# Patient Record
Sex: Male | Born: 1935 | Race: White | Hispanic: No | Smoking: Current every day smoker
Health system: Southern US, Community
[De-identification: ages and names within clinical notes are randomized; demographics above are authoritative.]

## PROBLEM LIST (undated history)

## (undated) DIAGNOSIS — R51 Headache: Secondary | ICD-10-CM

## (undated) DIAGNOSIS — K219 Gastro-esophageal reflux disease without esophagitis: Secondary | ICD-10-CM

## (undated) DIAGNOSIS — R569 Unspecified convulsions: Secondary | ICD-10-CM

## (undated) DIAGNOSIS — J189 Pneumonia, unspecified organism: Secondary | ICD-10-CM

## (undated) DIAGNOSIS — H9190 Unspecified hearing loss, unspecified ear: Secondary | ICD-10-CM

## (undated) DIAGNOSIS — J449 Chronic obstructive pulmonary disease, unspecified: Secondary | ICD-10-CM

## (undated) DIAGNOSIS — I639 Cerebral infarction, unspecified: Secondary | ICD-10-CM

## (undated) DIAGNOSIS — C3490 Malignant neoplasm of unspecified part of unspecified bronchus or lung: Secondary | ICD-10-CM

## (undated) DIAGNOSIS — G40209 Localization-related (focal) (partial) symptomatic epilepsy and epileptic syndromes with complex partial seizures, not intractable, without status epilepticus: Secondary | ICD-10-CM

## (undated) DIAGNOSIS — F0390 Unspecified dementia without behavioral disturbance: Secondary | ICD-10-CM

## (undated) DIAGNOSIS — I10 Essential (primary) hypertension: Secondary | ICD-10-CM

## (undated) HISTORY — PX: INNER EAR SURGERY: SHX679

## (undated) HISTORY — PX: TONSILLECTOMY: SUR1361

## (undated) HISTORY — DX: Chronic obstructive pulmonary disease, unspecified: J44.9

## (undated) HISTORY — DX: Essential (primary) hypertension: I10

## (undated) HISTORY — DX: Localization-related (focal) (partial) symptomatic epilepsy and epileptic syndromes with complex partial seizures, not intractable, without status epilepticus: G40.209

## (undated) HISTORY — DX: Malignant neoplasm of unspecified part of unspecified bronchus or lung: C34.90

## (undated) HISTORY — DX: Gastro-esophageal reflux disease without esophagitis: K21.9

## (undated) HISTORY — DX: Headache: R51

## (undated) HISTORY — PX: TOE AMPUTATION: SHX809

---

## 1997-06-16 ENCOUNTER — Ambulatory Visit (HOSPITAL_COMMUNITY): Admission: RE | Admit: 1997-06-16 | Discharge: 1997-06-16 | Payer: Self-pay | Admitting: Family Medicine

## 1997-07-27 ENCOUNTER — Encounter: Admission: RE | Admit: 1997-07-27 | Discharge: 1997-07-27 | Payer: Self-pay | Admitting: Internal Medicine

## 1997-08-02 ENCOUNTER — Ambulatory Visit (HOSPITAL_COMMUNITY): Admission: RE | Admit: 1997-08-02 | Discharge: 1997-08-02 | Payer: Self-pay | Admitting: Hematology and Oncology

## 1997-08-02 ENCOUNTER — Encounter: Admission: RE | Admit: 1997-08-02 | Discharge: 1997-08-02 | Payer: Self-pay | Admitting: Hematology and Oncology

## 1997-08-08 ENCOUNTER — Inpatient Hospital Stay: Admission: AD | Admit: 1997-08-08 | Discharge: 1997-08-10 | Payer: Self-pay | Admitting: Internal Medicine

## 1997-08-19 ENCOUNTER — Encounter: Admission: RE | Admit: 1997-08-19 | Discharge: 1997-08-19 | Payer: Self-pay | Admitting: Internal Medicine

## 1997-09-02 ENCOUNTER — Encounter: Admission: RE | Admit: 1997-09-02 | Discharge: 1997-09-02 | Payer: Self-pay | Admitting: Internal Medicine

## 1997-09-16 ENCOUNTER — Encounter: Admission: RE | Admit: 1997-09-16 | Discharge: 1997-09-16 | Payer: Self-pay | Admitting: Internal Medicine

## 1997-10-26 ENCOUNTER — Ambulatory Visit (HOSPITAL_COMMUNITY): Admission: RE | Admit: 1997-10-26 | Discharge: 1997-10-26 | Payer: Self-pay | Admitting: Internal Medicine

## 1997-10-26 ENCOUNTER — Encounter: Admission: RE | Admit: 1997-10-26 | Discharge: 1997-10-26 | Payer: Self-pay | Admitting: Internal Medicine

## 1997-11-15 ENCOUNTER — Ambulatory Visit (HOSPITAL_COMMUNITY): Admission: RE | Admit: 1997-11-15 | Discharge: 1997-11-15 | Payer: Self-pay | Admitting: Hematology and Oncology

## 1997-11-30 ENCOUNTER — Ambulatory Visit (HOSPITAL_COMMUNITY): Admission: RE | Admit: 1997-11-30 | Discharge: 1997-11-30 | Payer: Self-pay | Admitting: Hematology and Oncology

## 1998-05-10 ENCOUNTER — Ambulatory Visit (HOSPITAL_COMMUNITY): Admission: RE | Admit: 1998-05-10 | Discharge: 1998-05-10 | Payer: Self-pay | Admitting: Internal Medicine

## 1998-05-10 ENCOUNTER — Encounter: Admission: RE | Admit: 1998-05-10 | Discharge: 1998-05-10 | Payer: Self-pay | Admitting: Internal Medicine

## 1998-05-17 ENCOUNTER — Ambulatory Visit (HOSPITAL_COMMUNITY): Admission: RE | Admit: 1998-05-17 | Discharge: 1998-05-17 | Payer: Self-pay | Admitting: *Deleted

## 1999-01-31 ENCOUNTER — Encounter: Admission: RE | Admit: 1999-01-31 | Discharge: 1999-01-31 | Payer: Self-pay | Admitting: Internal Medicine

## 1999-02-27 ENCOUNTER — Encounter: Admission: RE | Admit: 1999-02-27 | Discharge: 1999-02-27 | Payer: Self-pay | Admitting: Hematology and Oncology

## 1999-03-19 ENCOUNTER — Encounter: Admission: RE | Admit: 1999-03-19 | Discharge: 1999-03-19 | Payer: Self-pay | Admitting: Internal Medicine

## 1999-04-16 ENCOUNTER — Encounter: Admission: RE | Admit: 1999-04-16 | Discharge: 1999-04-16 | Payer: Self-pay | Admitting: Internal Medicine

## 1999-12-07 ENCOUNTER — Encounter: Admission: RE | Admit: 1999-12-07 | Discharge: 1999-12-07 | Payer: Self-pay | Admitting: Internal Medicine

## 2001-03-19 ENCOUNTER — Encounter: Admission: RE | Admit: 2001-03-19 | Discharge: 2001-03-19 | Payer: Self-pay | Admitting: Internal Medicine

## 2001-03-27 ENCOUNTER — Ambulatory Visit (HOSPITAL_COMMUNITY): Admission: RE | Admit: 2001-03-27 | Discharge: 2001-03-27 | Payer: Self-pay | Admitting: Internal Medicine

## 2001-10-27 ENCOUNTER — Encounter: Admission: RE | Admit: 2001-10-27 | Discharge: 2001-10-27 | Payer: Self-pay | Admitting: Internal Medicine

## 2001-11-20 ENCOUNTER — Encounter: Admission: RE | Admit: 2001-11-20 | Discharge: 2001-11-20 | Payer: Self-pay | Admitting: Internal Medicine

## 2001-12-04 ENCOUNTER — Encounter: Admission: RE | Admit: 2001-12-04 | Discharge: 2001-12-04 | Payer: Self-pay | Admitting: Internal Medicine

## 2002-04-01 ENCOUNTER — Encounter: Admission: RE | Admit: 2002-04-01 | Discharge: 2002-04-01 | Payer: Self-pay | Admitting: Internal Medicine

## 2002-04-12 ENCOUNTER — Encounter: Admission: RE | Admit: 2002-04-12 | Discharge: 2002-04-12 | Payer: Self-pay | Admitting: Internal Medicine

## 2002-07-01 ENCOUNTER — Encounter: Admission: RE | Admit: 2002-07-01 | Discharge: 2002-07-01 | Payer: Self-pay | Admitting: Internal Medicine

## 2002-08-06 ENCOUNTER — Encounter: Admission: RE | Admit: 2002-08-06 | Discharge: 2002-08-06 | Payer: Self-pay | Admitting: Internal Medicine

## 2002-12-09 ENCOUNTER — Encounter: Admission: RE | Admit: 2002-12-09 | Discharge: 2002-12-09 | Payer: Self-pay | Admitting: Internal Medicine

## 2003-01-24 ENCOUNTER — Encounter: Admission: RE | Admit: 2003-01-24 | Discharge: 2003-01-24 | Payer: Self-pay | Admitting: Internal Medicine

## 2003-03-07 ENCOUNTER — Encounter: Admission: RE | Admit: 2003-03-07 | Discharge: 2003-03-07 | Payer: Self-pay | Admitting: Internal Medicine

## 2003-05-13 ENCOUNTER — Encounter: Admission: RE | Admit: 2003-05-13 | Discharge: 2003-05-13 | Payer: Self-pay | Admitting: Internal Medicine

## 2003-06-28 ENCOUNTER — Encounter: Admission: RE | Admit: 2003-06-28 | Discharge: 2003-06-28 | Payer: Self-pay | Admitting: Internal Medicine

## 2003-11-15 ENCOUNTER — Emergency Department (HOSPITAL_COMMUNITY): Admission: EM | Admit: 2003-11-15 | Discharge: 2003-11-15 | Payer: Self-pay | Admitting: Family Medicine

## 2003-11-16 ENCOUNTER — Ambulatory Visit (HOSPITAL_COMMUNITY): Admission: RE | Admit: 2003-11-16 | Discharge: 2003-11-16 | Payer: Self-pay | Admitting: *Deleted

## 2003-11-21 ENCOUNTER — Ambulatory Visit: Payer: Self-pay | Admitting: Internal Medicine

## 2003-11-22 ENCOUNTER — Ambulatory Visit: Payer: Self-pay | Admitting: Internal Medicine

## 2003-12-02 ENCOUNTER — Ambulatory Visit: Payer: Self-pay | Admitting: Internal Medicine

## 2003-12-06 ENCOUNTER — Encounter (INDEPENDENT_AMBULATORY_CARE_PROVIDER_SITE_OTHER): Payer: Self-pay | Admitting: Specialist

## 2003-12-06 ENCOUNTER — Ambulatory Visit: Admission: RE | Admit: 2003-12-06 | Discharge: 2003-12-06 | Payer: Self-pay | Admitting: Internal Medicine

## 2003-12-06 ENCOUNTER — Encounter (INDEPENDENT_AMBULATORY_CARE_PROVIDER_SITE_OTHER): Payer: Self-pay | Admitting: *Deleted

## 2003-12-13 ENCOUNTER — Ambulatory Visit: Payer: Self-pay | Admitting: Internal Medicine

## 2003-12-19 ENCOUNTER — Ambulatory Visit (HOSPITAL_COMMUNITY): Admission: RE | Admit: 2003-12-19 | Discharge: 2003-12-19 | Payer: Self-pay | Admitting: Internal Medicine

## 2003-12-20 ENCOUNTER — Ambulatory Visit: Admission: RE | Admit: 2003-12-20 | Discharge: 2004-02-23 | Payer: Self-pay | Admitting: Radiation Oncology

## 2003-12-22 ENCOUNTER — Ambulatory Visit: Payer: Self-pay | Admitting: Internal Medicine

## 2003-12-31 ENCOUNTER — Ambulatory Visit (HOSPITAL_COMMUNITY): Admission: RE | Admit: 2003-12-31 | Discharge: 2003-12-31 | Payer: Self-pay | Admitting: Internal Medicine

## 2004-01-02 ENCOUNTER — Ambulatory Visit: Payer: Self-pay | Admitting: Internal Medicine

## 2004-02-06 ENCOUNTER — Ambulatory Visit: Payer: Self-pay | Admitting: Internal Medicine

## 2004-02-07 DIAGNOSIS — C3490 Malignant neoplasm of unspecified part of unspecified bronchus or lung: Secondary | ICD-10-CM | POA: Insufficient documentation

## 2004-03-08 ENCOUNTER — Ambulatory Visit (HOSPITAL_COMMUNITY): Admission: RE | Admit: 2004-03-08 | Discharge: 2004-03-08 | Payer: Self-pay | Admitting: Internal Medicine

## 2004-03-15 ENCOUNTER — Ambulatory Visit: Admission: RE | Admit: 2004-03-15 | Discharge: 2004-03-15 | Payer: Self-pay | Admitting: Radiation Oncology

## 2004-03-27 ENCOUNTER — Ambulatory Visit: Payer: Self-pay | Admitting: Internal Medicine

## 2004-04-04 ENCOUNTER — Ambulatory Visit: Payer: Self-pay | Admitting: Internal Medicine

## 2004-05-01 ENCOUNTER — Ambulatory Visit (HOSPITAL_COMMUNITY): Admission: EM | Admit: 2004-05-01 | Discharge: 2004-05-01 | Payer: Self-pay | Admitting: Emergency Medicine

## 2004-05-01 ENCOUNTER — Ambulatory Visit: Payer: Self-pay | Admitting: Gastroenterology

## 2004-05-30 ENCOUNTER — Ambulatory Visit: Payer: Self-pay | Admitting: Internal Medicine

## 2004-06-04 ENCOUNTER — Ambulatory Visit (HOSPITAL_COMMUNITY): Admission: RE | Admit: 2004-06-04 | Discharge: 2004-06-04 | Payer: Self-pay | Admitting: Internal Medicine

## 2004-07-17 ENCOUNTER — Ambulatory Visit: Payer: Self-pay | Admitting: Internal Medicine

## 2004-08-15 ENCOUNTER — Ambulatory Visit (HOSPITAL_COMMUNITY): Admission: RE | Admit: 2004-08-15 | Discharge: 2004-08-15 | Payer: Self-pay | Admitting: Internal Medicine

## 2004-09-04 ENCOUNTER — Ambulatory Visit: Payer: Self-pay | Admitting: Internal Medicine

## 2004-10-25 ENCOUNTER — Ambulatory Visit: Payer: Self-pay | Admitting: Internal Medicine

## 2004-11-03 ENCOUNTER — Emergency Department (HOSPITAL_COMMUNITY): Admission: EM | Admit: 2004-11-03 | Discharge: 2004-11-03 | Payer: Self-pay | Admitting: Family Medicine

## 2004-11-12 ENCOUNTER — Ambulatory Visit (HOSPITAL_COMMUNITY): Admission: RE | Admit: 2004-11-12 | Discharge: 2004-11-12 | Payer: Self-pay | Admitting: Internal Medicine

## 2004-12-19 ENCOUNTER — Ambulatory Visit: Payer: Self-pay | Admitting: Internal Medicine

## 2005-01-08 ENCOUNTER — Ambulatory Visit: Payer: Self-pay | Admitting: Gastroenterology

## 2005-01-08 ENCOUNTER — Ambulatory Visit: Payer: Self-pay | Admitting: Critical Care Medicine

## 2005-01-25 ENCOUNTER — Ambulatory Visit: Payer: Self-pay | Admitting: Internal Medicine

## 2005-02-06 ENCOUNTER — Ambulatory Visit: Payer: Self-pay | Admitting: Gastroenterology

## 2005-02-13 ENCOUNTER — Ambulatory Visit (HOSPITAL_COMMUNITY): Admission: RE | Admit: 2005-02-13 | Discharge: 2005-02-13 | Payer: Self-pay | Admitting: Gastroenterology

## 2005-02-13 ENCOUNTER — Ambulatory Visit: Payer: Self-pay | Admitting: Gastroenterology

## 2005-02-14 ENCOUNTER — Ambulatory Visit: Payer: Self-pay | Admitting: Internal Medicine

## 2005-02-20 ENCOUNTER — Ambulatory Visit (HOSPITAL_COMMUNITY): Admission: RE | Admit: 2005-02-20 | Discharge: 2005-02-20 | Payer: Self-pay | Admitting: Internal Medicine

## 2005-02-28 ENCOUNTER — Ambulatory Visit: Payer: Self-pay | Admitting: Gastroenterology

## 2005-03-05 ENCOUNTER — Ambulatory Visit: Payer: Self-pay | Admitting: Internal Medicine

## 2005-03-14 ENCOUNTER — Ambulatory Visit: Payer: Self-pay | Admitting: Pulmonary Disease

## 2005-04-03 ENCOUNTER — Ambulatory Visit: Payer: Self-pay | Admitting: Internal Medicine

## 2005-04-25 LAB — CBC WITH DIFFERENTIAL/PLATELET
Basophils Absolute: 0.1 10*3/uL (ref 0.0–0.1)
EOS%: 3.6 % (ref 0.0–7.0)
Eosinophils Absolute: 0.1 10*3/uL (ref 0.0–0.5)
LYMPH%: 21.5 % (ref 14.0–48.0)
MCH: 22.3 pg — ABNORMAL LOW (ref 28.0–33.4)
MCV: 69.9 fL — ABNORMAL LOW (ref 81.6–98.0)
MONO%: 22.5 % — ABNORMAL HIGH (ref 0.0–13.0)
NEUT#: 1.4 10*3/uL — ABNORMAL LOW (ref 1.5–6.5)
Platelets: 270 10*3/uL (ref 145–400)
RBC: 4.27 10*6/uL (ref 4.20–5.71)

## 2005-04-25 LAB — TECHNOLOGIST REVIEW

## 2005-05-02 LAB — CBC WITH DIFFERENTIAL/PLATELET
BASO%: 2.4 % — ABNORMAL HIGH (ref 0.0–2.0)
EOS%: 2.7 % (ref 0.0–7.0)
HCT: 34.5 % — ABNORMAL LOW (ref 38.7–49.9)
LYMPH%: 19.1 % (ref 14.0–48.0)
MCH: 22 pg — ABNORMAL LOW (ref 28.0–33.4)
MCHC: 30.6 g/dL — ABNORMAL LOW (ref 32.0–35.9)
NEUT%: 61.6 % (ref 40.0–75.0)
Platelets: 366 10*3/uL (ref 145–400)

## 2005-05-07 ENCOUNTER — Ambulatory Visit (HOSPITAL_COMMUNITY): Admission: RE | Admit: 2005-05-07 | Discharge: 2005-05-07 | Payer: Self-pay | Admitting: Internal Medicine

## 2005-05-09 ENCOUNTER — Ambulatory Visit: Payer: Self-pay | Admitting: Internal Medicine

## 2005-05-09 LAB — CBC WITH DIFFERENTIAL/PLATELET
BASO%: 0.3 % (ref 0.0–2.0)
EOS%: 0 % (ref 0.0–7.0)
HCT: 35.6 % — ABNORMAL LOW (ref 38.7–49.9)
LYMPH%: 8.3 % — ABNORMAL LOW (ref 14.0–48.0)
MCH: 22.3 pg — ABNORMAL LOW (ref 28.0–33.4)
MCHC: 31.5 g/dL — ABNORMAL LOW (ref 32.0–35.9)
MCV: 70.6 fL — ABNORMAL LOW (ref 81.6–98.0)
MONO#: 0.2 10*3/uL (ref 0.1–0.9)
NEUT%: 88.8 % — ABNORMAL HIGH (ref 40.0–75.0)
Platelets: 439 10*3/uL — ABNORMAL HIGH (ref 145–400)

## 2005-05-09 LAB — COMPREHENSIVE METABOLIC PANEL
ALT: 19 U/L (ref 0–40)
AST: 15 U/L (ref 0–37)
Alkaline Phosphatase: 129 U/L — ABNORMAL HIGH (ref 39–117)
Creatinine, Ser: 0.7 mg/dL (ref 0.4–1.5)
Total Bilirubin: 0.2 mg/dL — ABNORMAL LOW (ref 0.3–1.2)

## 2005-05-16 LAB — COMPREHENSIVE METABOLIC PANEL
ALT: 21 U/L (ref 0–40)
AST: 19 U/L (ref 0–37)
Albumin: 3.9 g/dL (ref 3.5–5.2)
BUN: 9 mg/dL (ref 6–23)
Calcium: 8.7 mg/dL (ref 8.4–10.5)
Chloride: 102 mEq/L (ref 96–112)
Potassium: 4 mEq/L (ref 3.5–5.3)
Sodium: 137 mEq/L (ref 135–145)
Total Protein: 6.3 g/dL (ref 6.0–8.3)

## 2005-05-16 LAB — CBC WITH DIFFERENTIAL/PLATELET
BASO%: 0.6 % (ref 0.0–2.0)
EOS%: 3.6 % (ref 0.0–7.0)
HCT: 32.2 % — ABNORMAL LOW (ref 38.7–49.9)
LYMPH%: 19.7 % (ref 14.0–48.0)
MCH: 21.7 pg — ABNORMAL LOW (ref 28.0–33.4)
MCHC: 31.1 g/dL — ABNORMAL LOW (ref 32.0–35.9)
NEUT%: 55.1 % (ref 40.0–75.0)
lymph#: 0.7 10*3/uL — ABNORMAL LOW (ref 0.9–3.3)

## 2005-05-21 ENCOUNTER — Ambulatory Visit: Payer: Self-pay | Admitting: Internal Medicine

## 2005-05-23 LAB — CBC WITH DIFFERENTIAL/PLATELET
Basophils Absolute: 0.1 10*3/uL (ref 0.0–0.1)
EOS%: 2.6 % (ref 0.0–7.0)
HGB: 10.4 g/dL — ABNORMAL LOW (ref 13.0–17.1)
MCH: 22.5 pg — ABNORMAL LOW (ref 28.0–33.4)
MCV: 69.8 fL — ABNORMAL LOW (ref 81.6–98.0)
MONO%: 14.6 % — ABNORMAL HIGH (ref 0.0–13.0)
NEUT%: 66.5 % (ref 40.0–75.0)
RDW: 20.3 % — ABNORMAL HIGH (ref 11.2–14.6)

## 2005-05-23 LAB — COMPREHENSIVE METABOLIC PANEL
AST: 16 U/L (ref 0–37)
Alkaline Phosphatase: 118 U/L — ABNORMAL HIGH (ref 39–117)
BUN: 15 mg/dL (ref 6–23)
Creatinine, Ser: 0.7 mg/dL (ref 0.4–1.5)
Potassium: 4 mEq/L (ref 3.5–5.3)

## 2005-05-30 LAB — CBC WITH DIFFERENTIAL/PLATELET
Basophils Absolute: 0 10*3/uL (ref 0.0–0.1)
EOS%: 0.1 % (ref 0.0–7.0)
HCT: 34.1 % — ABNORMAL LOW (ref 38.7–49.9)
HGB: 10.7 g/dL — ABNORMAL LOW (ref 13.0–17.1)
MCH: 21.7 pg — ABNORMAL LOW (ref 28.0–33.4)
MCV: 68.9 fL — ABNORMAL LOW (ref 81.6–98.0)
MONO%: 2.4 % (ref 0.0–13.0)
NEUT%: 90.5 % — ABNORMAL HIGH (ref 40.0–75.0)

## 2005-05-30 LAB — COMPREHENSIVE METABOLIC PANEL
AST: 16 U/L (ref 0–37)
Alkaline Phosphatase: 117 U/L (ref 39–117)
BUN: 11 mg/dL (ref 6–23)
Calcium: 9.5 mg/dL (ref 8.4–10.5)
Chloride: 102 mEq/L (ref 96–112)
Creatinine, Ser: 0.7 mg/dL (ref 0.4–1.5)

## 2005-06-04 ENCOUNTER — Ambulatory Visit (HOSPITAL_COMMUNITY): Admission: RE | Admit: 2005-06-04 | Discharge: 2005-06-04 | Payer: Self-pay | Admitting: Internal Medicine

## 2005-06-06 LAB — COMPREHENSIVE METABOLIC PANEL
AST: 17 U/L (ref 0–37)
Albumin: 4.2 g/dL (ref 3.5–5.2)
Alkaline Phosphatase: 116 U/L (ref 39–117)
BUN: 11 mg/dL (ref 6–23)
Glucose, Bld: 126 mg/dL — ABNORMAL HIGH (ref 70–99)
Potassium: 4.5 mEq/L (ref 3.5–5.3)
Sodium: 136 mEq/L (ref 135–145)
Total Bilirubin: 0.4 mg/dL (ref 0.3–1.2)
Total Protein: 7 g/dL (ref 6.0–8.3)

## 2005-06-06 LAB — CBC WITH DIFFERENTIAL/PLATELET
EOS%: 3.6 % (ref 0.0–7.0)
LYMPH%: 22 % (ref 14.0–48.0)
MCH: 22 pg — ABNORMAL LOW (ref 28.0–33.4)
MCV: 69.4 fL — ABNORMAL LOW (ref 81.6–98.0)
MONO%: 23.5 % — ABNORMAL HIGH (ref 0.0–13.0)
Platelets: 313 10*3/uL (ref 145–400)
RBC: 4.72 10*6/uL (ref 4.20–5.71)
RDW: 21.7 % — ABNORMAL HIGH (ref 11.2–14.6)

## 2005-06-13 LAB — CBC WITH DIFFERENTIAL/PLATELET
Basophils Absolute: 0.1 10*3/uL (ref 0.0–0.1)
EOS%: 3.3 % (ref 0.0–7.0)
Eosinophils Absolute: 0.2 10*3/uL (ref 0.0–0.5)
HGB: 10.7 g/dL — ABNORMAL LOW (ref 13.0–17.1)
LYMPH%: 19.4 % (ref 14.0–48.0)
MCH: 21.5 pg — ABNORMAL LOW (ref 28.0–33.4)
MCV: 70.2 fL — ABNORMAL LOW (ref 81.6–98.0)
MONO%: 18.7 % — ABNORMAL HIGH (ref 0.0–13.0)
NEUT#: 2.9 10*3/uL (ref 1.5–6.5)
Platelets: 319 10*3/uL (ref 145–400)
RDW: 21.9 % — ABNORMAL HIGH (ref 11.2–14.6)

## 2005-06-20 LAB — COMPREHENSIVE METABOLIC PANEL
AST: 16 U/L (ref 0–37)
Albumin: 4.3 g/dL (ref 3.5–5.2)
Alkaline Phosphatase: 119 U/L — ABNORMAL HIGH (ref 39–117)
BUN: 12 mg/dL (ref 6–23)
Creatinine, Ser: 0.7 mg/dL (ref 0.4–1.5)
Glucose, Bld: 90 mg/dL (ref 70–99)
Total Bilirubin: 0.3 mg/dL (ref 0.3–1.2)

## 2005-06-20 LAB — CBC WITH DIFFERENTIAL/PLATELET
Basophils Absolute: 0.1 10*3/uL (ref 0.0–0.1)
Eosinophils Absolute: 0 10*3/uL (ref 0.0–0.5)
HCT: 35 % — ABNORMAL LOW (ref 38.7–49.9)
HGB: 10.4 g/dL — ABNORMAL LOW (ref 13.0–17.1)
MCH: 21.3 pg — ABNORMAL LOW (ref 28.0–33.4)
MCV: 71.7 fL — ABNORMAL LOW (ref 81.6–98.0)
MONO%: 11.6 % (ref 0.0–13.0)
NEUT#: 4.4 10*3/uL (ref 1.5–6.5)
NEUT%: 72.9 % (ref 40.0–75.0)
Platelets: 226 10*3/uL (ref 145–400)
RDW: 21.4 % — ABNORMAL HIGH (ref 11.2–14.6)

## 2005-06-27 LAB — CBC WITH DIFFERENTIAL/PLATELET
BASO%: 1.3 % (ref 0.0–2.0)
Eosinophils Absolute: 0.1 10*3/uL (ref 0.0–0.5)
MCV: 70.2 fL — ABNORMAL LOW (ref 81.6–98.0)
MONO%: 14.2 % — ABNORMAL HIGH (ref 0.0–13.0)
RBC: 4.74 10*6/uL (ref 4.20–5.71)
RDW: 21.3 % — ABNORMAL HIGH (ref 11.2–14.6)

## 2005-07-04 ENCOUNTER — Ambulatory Visit: Payer: Self-pay | Admitting: Internal Medicine

## 2005-07-04 LAB — CBC WITH DIFFERENTIAL/PLATELET
Eosinophils Absolute: 0.2 10*3/uL (ref 0.0–0.5)
MONO#: 0.7 10*3/uL (ref 0.1–0.9)
NEUT#: 3.8 10*3/uL (ref 1.5–6.5)
Platelets: 215 10*3/uL (ref 145–400)
RBC: 5.13 10*6/uL (ref 4.20–5.71)
RDW: 21.9 % — ABNORMAL HIGH (ref 11.2–14.6)
WBC: 5.5 10*3/uL (ref 4.0–10.0)
lymph#: 0.8 10*3/uL — ABNORMAL LOW (ref 0.9–3.3)

## 2005-07-10 ENCOUNTER — Ambulatory Visit (HOSPITAL_COMMUNITY): Admission: RE | Admit: 2005-07-10 | Discharge: 2005-07-10 | Payer: Self-pay | Admitting: Internal Medicine

## 2005-07-10 LAB — CBC WITH DIFFERENTIAL/PLATELET
Eosinophils Absolute: 0.2 10*3/uL (ref 0.0–0.5)
HCT: 33.9 % — ABNORMAL LOW (ref 38.7–49.9)
HGB: 10.4 g/dL — ABNORMAL LOW (ref 13.0–17.1)
LYMPH%: 15.2 % (ref 14.0–48.0)
MONO#: 0.6 10*3/uL (ref 0.1–0.9)
NEUT#: 3.6 10*3/uL (ref 1.5–6.5)
NEUT%: 67.7 % (ref 40.0–75.0)
Platelets: 347 10*3/uL (ref 145–400)
WBC: 5.3 10*3/uL (ref 4.0–10.0)

## 2005-07-15 LAB — COMPREHENSIVE METABOLIC PANEL
ALT: 20 U/L (ref 0–40)
AST: 16 U/L (ref 0–37)
Albumin: 4.1 g/dL (ref 3.5–5.2)
Alkaline Phosphatase: 133 U/L — ABNORMAL HIGH (ref 39–117)
BUN: 12 mg/dL (ref 6–23)
Potassium: 4.4 mEq/L (ref 3.5–5.3)
Sodium: 139 mEq/L (ref 135–145)

## 2005-07-15 LAB — CBC WITH DIFFERENTIAL/PLATELET
BASO%: 1.5 % (ref 0.0–2.0)
Basophils Absolute: 0.1 10*3/uL (ref 0.0–0.1)
EOS%: 5.8 % (ref 0.0–7.0)
HCT: 36.3 % — ABNORMAL LOW (ref 38.7–49.9)
HGB: 10.8 g/dL — ABNORMAL LOW (ref 13.0–17.1)
LYMPH%: 22.7 % (ref 14.0–48.0)
MCH: 21.5 pg — ABNORMAL LOW (ref 28.0–33.4)
MCHC: 29.6 g/dL — ABNORMAL LOW (ref 32.0–35.9)
MONO#: 0.6 10*3/uL (ref 0.1–0.9)
NEUT%: 56.4 % (ref 40.0–75.0)
Platelets: 295 10*3/uL (ref 145–400)

## 2005-08-01 ENCOUNTER — Ambulatory Visit (HOSPITAL_COMMUNITY): Admission: RE | Admit: 2005-08-01 | Discharge: 2005-08-01 | Payer: Self-pay | Admitting: Internal Medicine

## 2005-08-01 ENCOUNTER — Emergency Department (HOSPITAL_COMMUNITY): Admission: EM | Admit: 2005-08-01 | Discharge: 2005-08-01 | Payer: Self-pay | Admitting: Emergency Medicine

## 2005-08-01 ENCOUNTER — Ambulatory Visit: Payer: Self-pay | Admitting: Internal Medicine

## 2005-08-08 ENCOUNTER — Ambulatory Visit (HOSPITAL_COMMUNITY): Admission: RE | Admit: 2005-08-08 | Discharge: 2005-08-08 | Payer: Self-pay | Admitting: Internal Medicine

## 2005-08-20 ENCOUNTER — Ambulatory Visit: Payer: Self-pay | Admitting: Internal Medicine

## 2005-08-21 LAB — CBC WITH DIFFERENTIAL/PLATELET
BASO%: 0.4 % (ref 0.0–2.0)
EOS%: 0.1 % (ref 0.0–7.0)
MCH: 22.6 pg — ABNORMAL LOW (ref 28.0–33.4)
MCHC: 31.7 g/dL — ABNORMAL LOW (ref 32.0–35.9)
MONO#: 0.2 10*3/uL (ref 0.1–0.9)
NEUT%: 90.1 % — ABNORMAL HIGH (ref 40.0–75.0)
RBC: 5.09 10*6/uL (ref 4.20–5.71)
RDW: 18.7 % — ABNORMAL HIGH (ref 11.2–14.6)
WBC: 8.5 10*3/uL (ref 4.0–10.0)
lymph#: 0.6 10*3/uL — ABNORMAL LOW (ref 0.9–3.3)

## 2005-08-21 LAB — COMPREHENSIVE METABOLIC PANEL
ALT: 11 U/L (ref 0–40)
AST: 13 U/L (ref 0–37)
Albumin: 4.2 g/dL (ref 3.5–5.2)
CO2: 23 mEq/L (ref 19–32)
Calcium: 9.4 mg/dL (ref 8.4–10.5)
Chloride: 100 mEq/L (ref 96–112)
Creatinine, Ser: 0.79 mg/dL (ref 0.40–1.50)
Potassium: 4.6 mEq/L (ref 3.5–5.3)
Total Protein: 7.2 g/dL (ref 6.0–8.3)

## 2005-08-28 LAB — CBC WITH DIFFERENTIAL/PLATELET
BASO%: 1.1 % (ref 0.0–2.0)
EOS%: 3.4 % (ref 0.0–7.0)
HGB: 10.8 g/dL — ABNORMAL LOW (ref 13.0–17.1)
MCH: 22 pg — ABNORMAL LOW (ref 28.0–33.4)
MCV: 70.5 fL — ABNORMAL LOW (ref 81.6–98.0)
MONO%: 18.7 % — ABNORMAL HIGH (ref 0.0–13.0)
RBC: 4.9 10*6/uL (ref 4.20–5.71)
RDW: 17.2 % — ABNORMAL HIGH (ref 11.2–14.6)
lymph#: 0.9 10*3/uL (ref 0.9–3.3)

## 2005-08-28 LAB — COMPREHENSIVE METABOLIC PANEL
ALT: 18 U/L (ref 0–40)
AST: 13 U/L (ref 0–37)
Albumin: 3.8 g/dL (ref 3.5–5.2)
Alkaline Phosphatase: 120 U/L — ABNORMAL HIGH (ref 39–117)
BUN: 10 mg/dL (ref 6–23)
Calcium: 9.2 mg/dL (ref 8.4–10.5)
Chloride: 100 mEq/L (ref 96–112)
Potassium: 4.7 mEq/L (ref 3.5–5.3)
Sodium: 136 mEq/L (ref 135–145)
Total Protein: 6.7 g/dL (ref 6.0–8.3)

## 2005-08-29 ENCOUNTER — Ambulatory Visit: Admission: RE | Admit: 2005-08-29 | Discharge: 2005-08-29 | Payer: Self-pay | Admitting: Internal Medicine

## 2005-08-29 ENCOUNTER — Encounter: Payer: Self-pay | Admitting: Vascular Surgery

## 2005-09-02 LAB — PROTIME-INR

## 2005-09-04 ENCOUNTER — Other Ambulatory Visit: Payer: Self-pay | Admitting: Internal Medicine

## 2005-09-04 LAB — CBC WITH DIFFERENTIAL/PLATELET
Basophils Absolute: 0 10*3/uL (ref 0.0–0.1)
EOS%: 2.5 % (ref 0.0–7.0)
HCT: 33.8 % — ABNORMAL LOW (ref 38.7–49.9)
HGB: 10.9 g/dL — ABNORMAL LOW (ref 13.0–17.1)
LYMPH%: 19 % (ref 14.0–48.0)
MCH: 23.1 pg — ABNORMAL LOW (ref 28.0–33.4)
NEUT%: 65.8 % (ref 40.0–75.0)
Platelets: 370 10*3/uL (ref 145–400)
lymph#: 1 10*3/uL (ref 0.9–3.3)

## 2005-09-04 LAB — COMPREHENSIVE METABOLIC PANEL
AST: 12 U/L (ref 0–37)
BUN: 11 mg/dL (ref 6–23)
CO2: 26 mEq/L (ref 19–32)
Calcium: 9.1 mg/dL (ref 8.4–10.5)
Chloride: 101 mEq/L (ref 96–112)
Creatinine, Ser: 0.74 mg/dL (ref 0.40–1.50)
Total Bilirubin: 0.2 mg/dL — ABNORMAL LOW (ref 0.3–1.2)

## 2005-09-04 LAB — PROTIME-INR: INR: 1.9 — ABNORMAL LOW (ref 2.00–3.50)

## 2005-09-11 LAB — COMPREHENSIVE METABOLIC PANEL
ALT: 12 U/L (ref 0–40)
AST: 13 U/L (ref 0–37)
Albumin: 3.9 g/dL (ref 3.5–5.2)
CO2: 26 mEq/L (ref 19–32)
Calcium: 9.1 mg/dL (ref 8.4–10.5)
Chloride: 102 mEq/L (ref 96–112)
Potassium: 4.4 mEq/L (ref 3.5–5.3)

## 2005-09-11 LAB — CBC WITH DIFFERENTIAL/PLATELET
BASO%: 1.7 % (ref 0.0–2.0)
Basophils Absolute: 0.1 10*3/uL (ref 0.0–0.1)
EOS%: 4 % (ref 0.0–7.0)
HCT: 34.8 % — ABNORMAL LOW (ref 38.7–49.9)
HGB: 11.1 g/dL — ABNORMAL LOW (ref 13.0–17.1)
MCH: 23.1 pg — ABNORMAL LOW (ref 28.0–33.4)
MONO#: 0.6 10*3/uL (ref 0.1–0.9)
RDW: 18.1 % — ABNORMAL HIGH (ref 11.2–14.6)
WBC: 4.2 10*3/uL (ref 4.0–10.0)
lymph#: 1 10*3/uL (ref 0.9–3.3)

## 2005-09-11 LAB — PROTIME-INR: Protime: 40.8 Seconds — ABNORMAL HIGH (ref 10.6–13.4)

## 2005-09-19 ENCOUNTER — Ambulatory Visit: Payer: Self-pay | Admitting: Internal Medicine

## 2005-09-19 LAB — CBC WITH DIFFERENTIAL/PLATELET
BASO%: 0.3 % (ref 0.0–2.0)
EOS%: 0 % (ref 0.0–7.0)
HCT: 35.4 % — ABNORMAL LOW (ref 38.7–49.9)
LYMPH%: 11.9 % — ABNORMAL LOW (ref 14.0–48.0)
MCH: 23.1 pg — ABNORMAL LOW (ref 28.0–33.4)
MCHC: 31.6 g/dL — ABNORMAL LOW (ref 32.0–35.9)
MCV: 73.1 fL — ABNORMAL LOW (ref 81.6–98.0)
MONO%: 6.5 % (ref 0.0–13.0)
NEUT%: 81.3 % — ABNORMAL HIGH (ref 40.0–75.0)
Platelets: 269 10*3/uL (ref 145–400)

## 2005-09-19 LAB — COMPREHENSIVE METABOLIC PANEL
ALT: 10 U/L (ref 0–40)
AST: 13 U/L (ref 0–37)
Alkaline Phosphatase: 139 U/L — ABNORMAL HIGH (ref 39–117)
CO2: 21 mEq/L (ref 19–32)
Creatinine, Ser: 0.82 mg/dL (ref 0.40–1.50)
Total Bilirubin: 0.2 mg/dL — ABNORMAL LOW (ref 0.3–1.2)

## 2005-09-19 LAB — PROTIME-INR
INR: 1.4 — ABNORMAL LOW (ref 2.00–3.50)
Protime: 16.8 Seconds — ABNORMAL HIGH (ref 10.6–13.4)

## 2005-09-26 LAB — COMPREHENSIVE METABOLIC PANEL
AST: 14 U/L (ref 0–37)
Albumin: 3.9 g/dL (ref 3.5–5.2)
Alkaline Phosphatase: 119 U/L — ABNORMAL HIGH (ref 39–117)
BUN: 10 mg/dL (ref 6–23)
Calcium: 9.3 mg/dL (ref 8.4–10.5)
Chloride: 100 mEq/L (ref 96–112)
Glucose, Bld: 122 mg/dL — ABNORMAL HIGH (ref 70–99)
Potassium: 4.3 mEq/L (ref 3.5–5.3)
Sodium: 135 mEq/L (ref 135–145)
Total Protein: 6.8 g/dL (ref 6.0–8.3)

## 2005-09-26 LAB — CBC WITH DIFFERENTIAL/PLATELET
Basophils Absolute: 0.1 10*3/uL (ref 0.0–0.1)
EOS%: 1.8 % (ref 0.0–7.0)
Eosinophils Absolute: 0.1 10*3/uL (ref 0.0–0.5)
HGB: 10.9 g/dL — ABNORMAL LOW (ref 13.0–17.1)
MONO%: 21.2 % — ABNORMAL HIGH (ref 0.0–13.0)
NEUT#: 2.3 10*3/uL (ref 1.5–6.5)
RBC: 4.72 10*6/uL (ref 4.20–5.71)
RDW: 17 % — ABNORMAL HIGH (ref 11.2–14.6)
WBC: 3.9 10*3/uL — ABNORMAL LOW (ref 4.0–10.0)
lymph#: 0.7 10*3/uL — ABNORMAL LOW (ref 0.9–3.3)

## 2005-10-07 LAB — COMPREHENSIVE METABOLIC PANEL
ALT: 10 U/L (ref 0–40)
AST: 13 U/L (ref 0–37)
BUN: 10 mg/dL (ref 6–23)
Calcium: 9.4 mg/dL (ref 8.4–10.5)
Chloride: 102 mEq/L (ref 96–112)
Creatinine, Ser: 0.75 mg/dL (ref 0.40–1.50)
Total Bilirubin: 0.3 mg/dL (ref 0.3–1.2)

## 2005-10-07 LAB — CBC WITH DIFFERENTIAL/PLATELET
BASO%: 0.7 % (ref 0.0–2.0)
EOS%: 4.8 % (ref 0.0–7.0)
HCT: 36.8 % — ABNORMAL LOW (ref 38.7–49.9)
LYMPH%: 23.8 % (ref 14.0–48.0)
MCH: 23.1 pg — ABNORMAL LOW (ref 28.0–33.4)
MCHC: 31.4 g/dL — ABNORMAL LOW (ref 32.0–35.9)
MONO%: 12.9 % (ref 0.0–13.0)
NEUT%: 57.9 % (ref 40.0–75.0)
Platelets: 399 10*3/uL (ref 145–400)
RBC: 5.01 10*6/uL (ref 4.20–5.71)
WBC: 4.6 10*3/uL (ref 4.0–10.0)

## 2005-10-07 LAB — PROTIME-INR
INR: 1.8 — ABNORMAL LOW (ref 2.00–3.50)
Protime: 21.6 Seconds — ABNORMAL HIGH (ref 10.6–13.4)

## 2005-10-24 ENCOUNTER — Ambulatory Visit (HOSPITAL_COMMUNITY): Admission: RE | Admit: 2005-10-24 | Discharge: 2005-10-24 | Payer: Self-pay | Admitting: Internal Medicine

## 2005-10-29 ENCOUNTER — Ambulatory Visit: Payer: Self-pay | Admitting: Internal Medicine

## 2005-10-31 LAB — CBC WITH DIFFERENTIAL/PLATELET
Basophils Absolute: 0.1 10*3/uL (ref 0.0–0.1)
EOS%: 4.2 % (ref 0.0–7.0)
LYMPH%: 22.3 % (ref 14.0–48.0)
MCH: 23.5 pg — ABNORMAL LOW (ref 28.0–33.4)
MCV: 73.2 fL — ABNORMAL LOW (ref 81.6–98.0)
MONO%: 12.2 % (ref 0.0–13.0)
Platelets: 380 10*3/uL (ref 145–400)
RBC: 5.18 10*6/uL (ref 4.20–5.71)
RDW: 17.3 % — ABNORMAL HIGH (ref 11.2–14.6)

## 2005-10-31 LAB — COMPREHENSIVE METABOLIC PANEL
AST: 13 U/L (ref 0–37)
Albumin: 4.1 g/dL (ref 3.5–5.2)
BUN: 11 mg/dL (ref 6–23)
Calcium: 9.1 mg/dL (ref 8.4–10.5)
Chloride: 98 mEq/L (ref 96–112)
Creatinine, Ser: 0.8 mg/dL (ref 0.40–1.50)
Glucose, Bld: 136 mg/dL — ABNORMAL HIGH (ref 70–99)
Potassium: 4.3 mEq/L (ref 3.5–5.3)

## 2005-10-31 LAB — PROTIME-INR
INR: 1.9 — ABNORMAL LOW (ref 2.00–3.50)
Protime: 22.8 Seconds — ABNORMAL HIGH (ref 10.6–13.4)

## 2005-11-22 ENCOUNTER — Encounter (INDEPENDENT_AMBULATORY_CARE_PROVIDER_SITE_OTHER): Payer: Self-pay | Admitting: Unknown Physician Specialty

## 2005-11-22 ENCOUNTER — Ambulatory Visit: Payer: Self-pay | Admitting: Internal Medicine

## 2005-11-22 LAB — CONVERTED CEMR LAB: Phenytoin Lvl: 6.3 ug/mL — ABNORMAL LOW (ref 10.0–20.0)

## 2005-11-27 LAB — COMPREHENSIVE METABOLIC PANEL
ALT: 12 U/L (ref 0–53)
CO2: 27 mEq/L (ref 19–32)
Calcium: 8.5 mg/dL (ref 8.4–10.5)
Chloride: 100 mEq/L (ref 96–112)
Sodium: 135 mEq/L (ref 135–145)
Total Bilirubin: 0.2 mg/dL — ABNORMAL LOW (ref 0.3–1.2)
Total Protein: 6.6 g/dL (ref 6.0–8.3)

## 2005-11-27 LAB — PROTIME-INR
INR: 2.2 (ref 2.00–3.50)
Protime: 26.4 Seconds — ABNORMAL HIGH (ref 10.6–13.4)

## 2005-11-27 LAB — CBC WITH DIFFERENTIAL/PLATELET
BASO%: 1.2 % (ref 0.0–2.0)
MCHC: 32.6 g/dL (ref 32.0–35.9)
MONO#: 0.7 10*3/uL (ref 0.1–0.9)
RBC: 4.88 10*6/uL (ref 4.20–5.71)
WBC: 5.9 10*3/uL (ref 4.0–10.0)
lymph#: 0.6 10*3/uL — ABNORMAL LOW (ref 0.9–3.3)

## 2005-12-02 ENCOUNTER — Ambulatory Visit: Payer: Self-pay | Admitting: Hospitalist

## 2005-12-02 ENCOUNTER — Encounter (INDEPENDENT_AMBULATORY_CARE_PROVIDER_SITE_OTHER): Payer: Self-pay | Admitting: Unknown Physician Specialty

## 2005-12-02 LAB — CONVERTED CEMR LAB: Phenytoin Lvl: 9.1 ug/mL — ABNORMAL LOW (ref 10.0–20.0)

## 2005-12-23 ENCOUNTER — Ambulatory Visit: Payer: Self-pay | Admitting: Internal Medicine

## 2005-12-25 LAB — COMPREHENSIVE METABOLIC PANEL
ALT: 15 U/L (ref 0–53)
BUN: 11 mg/dL (ref 6–23)
CO2: 26 mEq/L (ref 19–32)
Calcium: 9 mg/dL (ref 8.4–10.5)
Chloride: 101 mEq/L (ref 96–112)
Creatinine, Ser: 0.65 mg/dL (ref 0.40–1.50)
Glucose, Bld: 127 mg/dL — ABNORMAL HIGH (ref 70–99)

## 2005-12-25 LAB — CBC WITH DIFFERENTIAL/PLATELET
BASO%: 0.4 % (ref 0.0–2.0)
LYMPH%: 15.5 % (ref 14.0–48.0)
MCH: 25.3 pg — ABNORMAL LOW (ref 28.0–33.4)
MCHC: 32.5 g/dL (ref 32.0–35.9)
MCV: 78 fL — ABNORMAL LOW (ref 81.6–98.0)
MONO%: 15.8 % — ABNORMAL HIGH (ref 0.0–13.0)
Platelets: 482 10*3/uL — ABNORMAL HIGH (ref 145–400)
RBC: 4.62 10*6/uL (ref 4.20–5.71)

## 2005-12-25 LAB — PROTIME-INR: Protime: 19.2 Seconds — ABNORMAL HIGH (ref 10.6–13.4)

## 2006-01-02 DIAGNOSIS — F17200 Nicotine dependence, unspecified, uncomplicated: Secondary | ICD-10-CM

## 2006-01-02 DIAGNOSIS — J449 Chronic obstructive pulmonary disease, unspecified: Secondary | ICD-10-CM

## 2006-01-02 DIAGNOSIS — M719 Bursopathy, unspecified: Secondary | ICD-10-CM

## 2006-01-02 DIAGNOSIS — F1021 Alcohol dependence, in remission: Secondary | ICD-10-CM

## 2006-01-02 DIAGNOSIS — G40209 Localization-related (focal) (partial) symptomatic epilepsy and epileptic syndromes with complex partial seizures, not intractable, without status epilepticus: Secondary | ICD-10-CM

## 2006-01-02 DIAGNOSIS — K219 Gastro-esophageal reflux disease without esophagitis: Secondary | ICD-10-CM

## 2006-01-02 DIAGNOSIS — I10 Essential (primary) hypertension: Secondary | ICD-10-CM | POA: Insufficient documentation

## 2006-01-02 DIAGNOSIS — M67919 Unspecified disorder of synovium and tendon, unspecified shoulder: Secondary | ICD-10-CM | POA: Insufficient documentation

## 2006-01-02 DIAGNOSIS — K644 Residual hemorrhoidal skin tags: Secondary | ICD-10-CM | POA: Insufficient documentation

## 2006-01-09 ENCOUNTER — Ambulatory Visit (HOSPITAL_COMMUNITY): Admission: RE | Admit: 2006-01-09 | Discharge: 2006-01-09 | Payer: Self-pay | Admitting: Internal Medicine

## 2006-01-16 LAB — COMPREHENSIVE METABOLIC PANEL
ALT: 20 U/L (ref 0–53)
CO2: 24 mEq/L (ref 19–32)
Calcium: 8.7 mg/dL (ref 8.4–10.5)
Chloride: 101 mEq/L (ref 96–112)
Creatinine, Ser: 0.85 mg/dL (ref 0.40–1.50)
Sodium: 137 mEq/L (ref 135–145)
Total Protein: 6.3 g/dL (ref 6.0–8.3)

## 2006-01-16 LAB — CBC WITH DIFFERENTIAL/PLATELET
BASO%: 0.7 % (ref 0.0–2.0)
Eosinophils Absolute: 0.3 10*3/uL (ref 0.0–0.5)
HCT: 34.9 % — ABNORMAL LOW (ref 38.7–49.9)
MCHC: 32.8 g/dL (ref 32.0–35.9)
MONO#: 0.7 10*3/uL (ref 0.1–0.9)
NEUT#: 2.8 10*3/uL (ref 1.5–6.5)
NEUT%: 63.3 % (ref 40.0–75.0)
WBC: 4.5 10*3/uL (ref 4.0–10.0)
lymph#: 0.6 10*3/uL — ABNORMAL LOW (ref 0.9–3.3)

## 2006-02-10 ENCOUNTER — Ambulatory Visit: Payer: Self-pay | Admitting: Internal Medicine

## 2006-02-13 ENCOUNTER — Telehealth: Payer: Self-pay | Admitting: *Deleted

## 2006-02-13 ENCOUNTER — Ambulatory Visit: Payer: Self-pay | Admitting: Internal Medicine

## 2006-02-21 ENCOUNTER — Telehealth: Payer: Self-pay | Admitting: *Deleted

## 2006-02-27 LAB — PROTIME-INR
INR: 2.8 (ref 2.00–3.50)
Protime: 33.6 s — ABNORMAL HIGH (ref 10.6–13.4)

## 2006-03-27 ENCOUNTER — Ambulatory Visit: Payer: Self-pay | Admitting: Internal Medicine

## 2006-03-27 LAB — PROTIME-INR
INR: 3.9 — ABNORMAL HIGH (ref 2.00–3.50)
Protime: 46.8 Seconds — ABNORMAL HIGH (ref 10.6–13.4)

## 2006-04-10 ENCOUNTER — Ambulatory Visit (HOSPITAL_COMMUNITY): Admission: RE | Admit: 2006-04-10 | Discharge: 2006-04-10 | Payer: Self-pay | Admitting: Internal Medicine

## 2006-04-16 LAB — CBC WITH DIFFERENTIAL/PLATELET
BASO%: 1.7 % (ref 0.0–2.0)
Eosinophils Absolute: 0.2 10*3/uL (ref 0.0–0.5)
MCHC: 32.7 g/dL (ref 32.0–35.9)
MONO#: 0.5 10*3/uL (ref 0.1–0.9)
NEUT#: 2.4 10*3/uL (ref 1.5–6.5)
RBC: 5.23 10*6/uL (ref 4.20–5.71)
RDW: 13.8 % (ref 11.2–14.6)
WBC: 4.2 10*3/uL (ref 4.0–10.0)

## 2006-04-16 LAB — COMPREHENSIVE METABOLIC PANEL
ALT: 11 U/L (ref 0–53)
Albumin: 4.2 g/dL (ref 3.5–5.2)
Alkaline Phosphatase: 151 U/L — ABNORMAL HIGH (ref 39–117)
CO2: 27 mEq/L (ref 19–32)
Glucose, Bld: 88 mg/dL (ref 70–99)
Potassium: 4.4 mEq/L (ref 3.5–5.3)
Sodium: 138 mEq/L (ref 135–145)
Total Protein: 7.3 g/dL (ref 6.0–8.3)

## 2006-06-02 ENCOUNTER — Ambulatory Visit: Payer: Self-pay | Admitting: Internal Medicine

## 2006-06-04 ENCOUNTER — Ambulatory Visit (HOSPITAL_COMMUNITY): Admission: RE | Admit: 2006-06-04 | Discharge: 2006-06-04 | Payer: Self-pay | Admitting: Internal Medicine

## 2006-06-04 LAB — COMPREHENSIVE METABOLIC PANEL
Albumin: 3.9 g/dL (ref 3.5–5.2)
Alkaline Phosphatase: 134 U/L — ABNORMAL HIGH (ref 39–117)
BUN: 9 mg/dL (ref 6–23)
CO2: 28 mEq/L (ref 19–32)
Calcium: 8.9 mg/dL (ref 8.4–10.5)
Chloride: 101 mEq/L (ref 96–112)
Glucose, Bld: 92 mg/dL (ref 70–99)
Potassium: 4.7 mEq/L (ref 3.5–5.3)
Sodium: 136 mEq/L (ref 135–145)
Total Protein: 6.5 g/dL (ref 6.0–8.3)

## 2006-06-04 LAB — CBC WITH DIFFERENTIAL/PLATELET
Eosinophils Absolute: 0.2 10*3/uL (ref 0.0–0.5)
HCT: 38.7 % (ref 38.7–49.9)
LYMPH%: 23.7 % (ref 14.0–48.0)
MCV: 74.2 fL — ABNORMAL LOW (ref 81.6–98.0)
MONO%: 10.8 % (ref 0.0–13.0)
NEUT#: 2.8 10*3/uL (ref 1.5–6.5)
NEUT%: 61.6 % (ref 40.0–75.0)
Platelets: 239 10*3/uL (ref 145–400)
RBC: 5.22 10*6/uL (ref 4.20–5.71)

## 2006-06-04 LAB — PROTIME-INR: Protime: 45.6 Seconds — ABNORMAL HIGH (ref 10.6–13.4)

## 2006-06-11 LAB — PROTIME-INR
INR: 1.7 — ABNORMAL LOW (ref 2.00–3.50)
Protime: 20.4 Seconds — ABNORMAL HIGH (ref 10.6–13.4)

## 2006-07-13 ENCOUNTER — Emergency Department (HOSPITAL_COMMUNITY): Admission: EM | Admit: 2006-07-13 | Discharge: 2006-07-13 | Payer: Self-pay | Admitting: Family Medicine

## 2006-07-31 ENCOUNTER — Ambulatory Visit: Payer: Self-pay | Admitting: Internal Medicine

## 2006-07-31 ENCOUNTER — Ambulatory Visit (HOSPITAL_COMMUNITY): Admission: RE | Admit: 2006-07-31 | Discharge: 2006-07-31 | Payer: Self-pay | Admitting: Internal Medicine

## 2006-09-03 ENCOUNTER — Ambulatory Visit (HOSPITAL_COMMUNITY): Admission: RE | Admit: 2006-09-03 | Discharge: 2006-09-03 | Payer: Self-pay | Admitting: Internal Medicine

## 2006-09-03 LAB — CBC WITH DIFFERENTIAL/PLATELET
Basophils Absolute: 0.1 10*3/uL (ref 0.0–0.1)
EOS%: 2.9 % (ref 0.0–7.0)
Eosinophils Absolute: 0.1 10*3/uL (ref 0.0–0.5)
LYMPH%: 26.5 % (ref 14.0–48.0)
MCH: 28.3 pg (ref 28.0–33.4)
MCV: 82.7 fL (ref 81.6–98.0)
MONO%: 11.2 % (ref 0.0–13.0)
NEUT#: 2.7 10*3/uL (ref 1.5–6.5)
Platelets: 222 10*3/uL (ref 145–400)
RBC: 5.01 10*6/uL (ref 4.20–5.71)
RDW: 13.8 % (ref 11.2–14.6)

## 2006-09-03 LAB — COMPREHENSIVE METABOLIC PANEL
AST: 19 U/L (ref 0–37)
Alkaline Phosphatase: 115 U/L (ref 39–117)
BUN: 5 mg/dL — ABNORMAL LOW (ref 6–23)
Glucose, Bld: 102 mg/dL — ABNORMAL HIGH (ref 70–99)
Sodium: 137 mEq/L (ref 135–145)
Total Bilirubin: 0.7 mg/dL (ref 0.3–1.2)

## 2006-09-10 ENCOUNTER — Encounter (INDEPENDENT_AMBULATORY_CARE_PROVIDER_SITE_OTHER): Payer: Self-pay | Admitting: *Deleted

## 2006-11-17 ENCOUNTER — Telehealth (INDEPENDENT_AMBULATORY_CARE_PROVIDER_SITE_OTHER): Payer: Self-pay | Admitting: *Deleted

## 2006-12-02 ENCOUNTER — Ambulatory Visit: Payer: Self-pay | Admitting: Internal Medicine

## 2006-12-03 ENCOUNTER — Ambulatory Visit: Payer: Self-pay | Admitting: Internal Medicine

## 2006-12-03 ENCOUNTER — Encounter (INDEPENDENT_AMBULATORY_CARE_PROVIDER_SITE_OTHER): Payer: Self-pay | Admitting: *Deleted

## 2006-12-03 DIAGNOSIS — I82409 Acute embolism and thrombosis of unspecified deep veins of unspecified lower extremity: Secondary | ICD-10-CM

## 2006-12-03 DIAGNOSIS — G4700 Insomnia, unspecified: Secondary | ICD-10-CM

## 2006-12-03 LAB — CONVERTED CEMR LAB
AST: 13 units/L (ref 0–37)
Basophils Absolute: 0 10*3/uL (ref 0.0–0.1)
CO2: 27 meq/L (ref 19–32)
Chloride: 99 meq/L (ref 96–112)
Eosinophils Absolute: 0.2 10*3/uL (ref 0.2–0.7)
HCT: 47.9 % (ref 39.0–52.0)
Lymphocytes Relative: 22 % (ref 12–46)
Lymphs Abs: 1.3 10*3/uL (ref 0.7–4.0)
Monocytes Absolute: 0.6 10*3/uL (ref 0.1–1.0)
Neutro Abs: 3.9 10*3/uL (ref 1.7–7.7)
Neutrophils Relative %: 65 % (ref 43–77)
Potassium: 4.7 meq/L (ref 3.5–5.3)
Sodium: 135 meq/L (ref 135–145)
Total Bilirubin: 0.3 mg/dL (ref 0.3–1.2)

## 2006-12-04 ENCOUNTER — Ambulatory Visit (HOSPITAL_COMMUNITY): Admission: RE | Admit: 2006-12-04 | Discharge: 2006-12-04 | Payer: Self-pay | Admitting: Internal Medicine

## 2006-12-04 LAB — CBC WITH DIFFERENTIAL/PLATELET
BASO%: 0.7 % (ref 0.0–2.0)
EOS%: 2.9 % (ref 0.0–7.0)
Eosinophils Absolute: 0.1 10*3/uL (ref 0.0–0.5)
HGB: 15.4 g/dL (ref 13.0–17.1)
MCH: 30.1 pg (ref 28.0–33.4)
MCV: 85.3 fL (ref 81.6–98.0)
NEUT#: 3.1 10*3/uL (ref 1.5–6.5)
Platelets: 211 10*3/uL (ref 145–400)

## 2006-12-04 LAB — COMPREHENSIVE METABOLIC PANEL
ALT: 14 U/L (ref 0–53)
AST: 18 U/L (ref 0–37)
CO2: 27 mEq/L (ref 19–32)
Calcium: 8.9 mg/dL (ref 8.4–10.5)
Chloride: 101 mEq/L (ref 96–112)
Potassium: 4.4 mEq/L (ref 3.5–5.3)
Sodium: 134 mEq/L — ABNORMAL LOW (ref 135–145)
Total Protein: 6.7 g/dL (ref 6.0–8.3)

## 2006-12-10 ENCOUNTER — Encounter (INDEPENDENT_AMBULATORY_CARE_PROVIDER_SITE_OTHER): Payer: Self-pay | Admitting: *Deleted

## 2007-01-08 LAB — PROTIME-INR: Protime: 34.8 Seconds — ABNORMAL HIGH (ref 10.6–13.4)

## 2007-02-03 ENCOUNTER — Ambulatory Visit: Payer: Self-pay | Admitting: Internal Medicine

## 2007-02-05 LAB — PROTIME-INR: INR: 4.4 — ABNORMAL HIGH (ref 2.00–3.50)

## 2007-03-05 ENCOUNTER — Ambulatory Visit (HOSPITAL_COMMUNITY): Admission: RE | Admit: 2007-03-05 | Discharge: 2007-03-05 | Payer: Self-pay | Admitting: Internal Medicine

## 2007-03-05 LAB — CBC WITH DIFFERENTIAL/PLATELET
BASO%: 0.8 % (ref 0.0–2.0)
EOS%: 2.7 % (ref 0.0–7.0)
HCT: 42.7 % (ref 38.7–49.9)
MCH: 30.7 pg (ref 28.0–33.4)
MCHC: 36.1 g/dL — ABNORMAL HIGH (ref 32.0–35.9)
MONO%: 7.8 % (ref 0.0–13.0)
NEUT%: 66.1 % (ref 40.0–75.0)
lymph#: 0.9 10*3/uL (ref 0.9–3.3)

## 2007-03-05 LAB — COMPREHENSIVE METABOLIC PANEL
ALT: 13 U/L (ref 0–53)
BUN: 6 mg/dL (ref 6–23)
CO2: 29 mEq/L (ref 19–32)
Calcium: 9.1 mg/dL (ref 8.4–10.5)
Chloride: 100 mEq/L (ref 96–112)
Creatinine, Ser: 0.74 mg/dL (ref 0.40–1.50)
Glucose, Bld: 117 mg/dL — ABNORMAL HIGH (ref 70–99)

## 2007-03-05 LAB — PROTIME-INR

## 2007-03-12 ENCOUNTER — Encounter (INDEPENDENT_AMBULATORY_CARE_PROVIDER_SITE_OTHER): Payer: Self-pay | Admitting: *Deleted

## 2007-03-12 LAB — CBC WITH DIFFERENTIAL/PLATELET
BASO%: 0.6 % (ref 0.0–2.0)
HCT: 42.2 % (ref 38.7–49.9)
HGB: 15.1 g/dL (ref 13.0–17.1)
MCHC: 35.8 g/dL (ref 32.0–35.9)
MONO#: 0.4 10*3/uL (ref 0.1–0.9)
NEUT#: 3 10*3/uL (ref 1.5–6.5)
NEUT%: 68.9 % (ref 40.0–75.0)
WBC: 4.3 10*3/uL (ref 4.0–10.0)
lymph#: 0.8 10*3/uL — ABNORMAL LOW (ref 0.9–3.3)

## 2007-03-12 LAB — COMPREHENSIVE METABOLIC PANEL
ALT: <8 U/L (ref 0–53)
Alkaline Phosphatase: 139 U/L — ABNORMAL HIGH (ref 39–117)
CO2: 24 mEq/L (ref 19–32)
Sodium: 134 mEq/L — ABNORMAL LOW (ref 135–145)
Total Bilirubin: 0.3 mg/dL (ref 0.3–1.2)
Total Protein: 6.6 g/dL (ref 6.0–8.3)

## 2007-03-12 LAB — PROTIME-INR: Protime: 19.2 Seconds — ABNORMAL HIGH (ref 10.6–13.4)

## 2007-03-24 ENCOUNTER — Ambulatory Visit: Payer: Self-pay | Admitting: Internal Medicine

## 2007-03-26 LAB — PROTIME-INR: INR: 4.9 — ABNORMAL HIGH (ref 2.00–3.50)

## 2007-04-02 LAB — PROTIME-INR: INR: 1.9 — ABNORMAL LOW (ref 2.00–3.50)

## 2007-04-09 LAB — PROTIME-INR: INR: 1.9 — ABNORMAL LOW (ref 2.00–3.50)

## 2007-04-23 LAB — PROTIME-INR: Protime: 22.8 Seconds — ABNORMAL HIGH (ref 10.6–13.4)

## 2007-05-07 ENCOUNTER — Ambulatory Visit: Payer: Self-pay | Admitting: Internal Medicine

## 2007-05-07 LAB — PROTIME-INR: INR: 2.2 (ref 2.00–3.50)

## 2007-05-21 LAB — PROTIME-INR: Protime: 19.2 Seconds — ABNORMAL HIGH (ref 10.6–13.4)

## 2007-06-04 ENCOUNTER — Ambulatory Visit (HOSPITAL_COMMUNITY): Admission: RE | Admit: 2007-06-04 | Discharge: 2007-06-04 | Payer: Self-pay | Admitting: Internal Medicine

## 2007-06-04 LAB — CBC WITH DIFFERENTIAL/PLATELET
BASO%: 0.3 % (ref 0.0–2.0)
HCT: 44.7 % (ref 38.7–49.9)
HGB: 15.7 g/dL (ref 13.0–17.1)
MCHC: 35.2 g/dL (ref 32.0–35.9)
MONO#: 0.4 10*3/uL (ref 0.1–0.9)
NEUT%: 66.9 % (ref 40.0–75.0)
WBC: 4.2 10*3/uL (ref 4.0–10.0)
lymph#: 0.8 10*3/uL — ABNORMAL LOW (ref 0.9–3.3)

## 2007-06-04 LAB — COMPREHENSIVE METABOLIC PANEL
ALT: 12 U/L (ref 0–53)
Albumin: 4.1 g/dL (ref 3.5–5.2)
CO2: 27 mEq/L (ref 19–32)
Calcium: 8.7 mg/dL (ref 8.4–10.5)
Chloride: 99 mEq/L (ref 96–112)
Creatinine, Ser: 0.68 mg/dL (ref 0.40–1.50)
Potassium: 5 mEq/L (ref 3.5–5.3)
Total Protein: 6.5 g/dL (ref 6.0–8.3)

## 2007-06-09 ENCOUNTER — Encounter (INDEPENDENT_AMBULATORY_CARE_PROVIDER_SITE_OTHER): Payer: Self-pay | Admitting: *Deleted

## 2007-06-09 ENCOUNTER — Encounter: Payer: Self-pay | Admitting: Gastroenterology

## 2007-07-08 ENCOUNTER — Ambulatory Visit: Payer: Self-pay | Admitting: Internal Medicine

## 2007-07-10 LAB — PROTIME-INR

## 2007-08-10 LAB — PROTIME-INR: INR: 2.1 (ref 2.00–3.50)

## 2007-08-31 ENCOUNTER — Ambulatory Visit: Payer: Self-pay | Admitting: Internal Medicine

## 2007-09-02 ENCOUNTER — Ambulatory Visit (HOSPITAL_COMMUNITY): Admission: RE | Admit: 2007-09-02 | Discharge: 2007-09-02 | Payer: Self-pay | Admitting: Internal Medicine

## 2007-09-09 ENCOUNTER — Encounter: Payer: Self-pay | Admitting: Gastroenterology

## 2007-09-09 ENCOUNTER — Encounter (INDEPENDENT_AMBULATORY_CARE_PROVIDER_SITE_OTHER): Payer: Self-pay | Admitting: Internal Medicine

## 2007-09-23 LAB — PROTIME-INR

## 2007-10-07 LAB — PROTIME-INR
INR: 2.2 (ref 2.00–3.50)
Protime: 26.4 Seconds — ABNORMAL HIGH (ref 10.6–13.4)

## 2007-10-19 ENCOUNTER — Ambulatory Visit: Payer: Self-pay | Admitting: Internal Medicine

## 2007-10-21 LAB — PROTIME-INR
INR: 2.7 (ref 2.00–3.50)
Protime: 32.4 Seconds — ABNORMAL HIGH (ref 10.6–13.4)

## 2007-11-16 ENCOUNTER — Emergency Department (HOSPITAL_COMMUNITY): Admission: EM | Admit: 2007-11-16 | Discharge: 2007-11-16 | Payer: Self-pay | Admitting: Emergency Medicine

## 2007-11-18 LAB — PROTIME-INR

## 2007-12-02 LAB — PROTIME-INR
INR: 1.7 — ABNORMAL LOW (ref 2.00–3.50)
Protime: 20.4 Seconds — ABNORMAL HIGH (ref 10.6–13.4)

## 2007-12-14 ENCOUNTER — Ambulatory Visit: Payer: Self-pay | Admitting: Internal Medicine

## 2007-12-16 LAB — PROTIME-INR
INR: 2.2 (ref 2.00–3.50)
Protime: 26.4 Seconds — ABNORMAL HIGH (ref 10.6–13.4)

## 2007-12-30 LAB — COMPREHENSIVE METABOLIC PANEL
ALT: 11 U/L (ref 0–53)
AST: 12 U/L (ref 0–37)
Alkaline Phosphatase: 130 U/L — ABNORMAL HIGH (ref 39–117)
Calcium: 8.8 mg/dL (ref 8.4–10.5)
Chloride: 101 mEq/L (ref 96–112)
Creatinine, Ser: 0.71 mg/dL (ref 0.40–1.50)
Total Bilirubin: 0.4 mg/dL (ref 0.3–1.2)

## 2007-12-30 LAB — CBC WITH DIFFERENTIAL/PLATELET
Basophils Absolute: 0 10*3/uL (ref 0.0–0.1)
Eosinophils Absolute: 0.2 10*3/uL (ref 0.0–0.5)
HCT: 45 % (ref 38.7–49.9)
HGB: 15.7 g/dL (ref 13.0–17.1)
NEUT#: 2.5 10*3/uL (ref 1.5–6.5)
NEUT%: 61.9 % (ref 40.0–75.0)
RDW: 13.4 % (ref 11.2–14.6)
lymph#: 0.9 10*3/uL (ref 0.9–3.3)

## 2007-12-30 LAB — PROTIME-INR
INR: 2.2 (ref 2.00–3.50)
Protime: 26.4 Seconds — ABNORMAL HIGH (ref 10.6–13.4)

## 2008-01-04 ENCOUNTER — Ambulatory Visit (HOSPITAL_COMMUNITY): Admission: RE | Admit: 2008-01-04 | Discharge: 2008-01-04 | Payer: Self-pay | Admitting: Internal Medicine

## 2008-01-07 ENCOUNTER — Encounter: Payer: Self-pay | Admitting: Gastroenterology

## 2008-01-07 ENCOUNTER — Encounter: Payer: Self-pay | Admitting: Internal Medicine

## 2008-01-21 LAB — PROTIME-INR

## 2008-02-02 ENCOUNTER — Ambulatory Visit: Payer: Self-pay | Admitting: Internal Medicine

## 2008-02-04 LAB — PROTIME-INR: Protime: 27.6 Seconds — ABNORMAL HIGH (ref 10.6–13.4)

## 2008-02-18 LAB — PROTIME-INR: INR: 2.1 (ref 2.00–3.50)

## 2008-03-08 ENCOUNTER — Telehealth: Payer: Self-pay | Admitting: Internal Medicine

## 2008-03-17 LAB — PROTIME-INR
INR: 2 (ref 2.00–3.50)
Protime: 24 Seconds — ABNORMAL HIGH (ref 10.6–13.4)

## 2008-03-23 ENCOUNTER — Ambulatory Visit: Payer: Self-pay | Admitting: *Deleted

## 2008-03-23 ENCOUNTER — Encounter (INDEPENDENT_AMBULATORY_CARE_PROVIDER_SITE_OTHER): Payer: Self-pay | Admitting: *Deleted

## 2008-03-23 DIAGNOSIS — Z86718 Personal history of other venous thrombosis and embolism: Secondary | ICD-10-CM | POA: Insufficient documentation

## 2008-03-24 ENCOUNTER — Encounter (INDEPENDENT_AMBULATORY_CARE_PROVIDER_SITE_OTHER): Payer: Self-pay | Admitting: *Deleted

## 2008-03-24 LAB — CONVERTED CEMR LAB
BUN: 7 mg/dL (ref 6–23)
Band Neutrophils: 0 % (ref 0–10)
Basophils Relative: 1 % (ref 0–1)
Calcium: 8.8 mg/dL (ref 8.4–10.5)
Chloride: 102 meq/L (ref 96–112)
Eosinophils Absolute: 0.1 10*3/uL (ref 0.0–0.7)
HCT: 47.5 % (ref 39.0–52.0)
HDL: 36 mg/dL — ABNORMAL LOW (ref >39)
Hemoglobin: 16.1 g/dL (ref 13.0–17.0)
LDL Cholesterol: 135 mg/dL — ABNORMAL HIGH (ref 0–99)
MCHC: 33.9 g/dL (ref 30.0–36.0)
MCV: 93 fL (ref 78.0–100.0)
Monocytes Absolute: 0.5 10*3/uL (ref 0.1–1.0)
Phenytoin Lvl: 47.7 ug/mL (ref 10.0–20.0)
Platelets: 199 10*3/uL (ref 150–400)
Potassium: 4.7 meq/L (ref 3.5–5.3)
RBC: 5.11 M/uL (ref 4.22–5.81)
RDW: 14 % (ref 11.5–15.5)
Sodium: 139 meq/L (ref 135–145)
VLDL: 47 mg/dL — ABNORMAL HIGH (ref 0–40)

## 2008-03-29 ENCOUNTER — Ambulatory Visit: Payer: Self-pay | Admitting: Internal Medicine

## 2008-03-31 LAB — PROTIME-INR: INR: 1.9 — ABNORMAL LOW (ref 2.00–3.50)

## 2008-04-13 ENCOUNTER — Telehealth (INDEPENDENT_AMBULATORY_CARE_PROVIDER_SITE_OTHER): Payer: Self-pay | Admitting: *Deleted

## 2008-04-14 LAB — PROTIME-INR: Protime: 26.4 Seconds — ABNORMAL HIGH (ref 10.6–13.4)

## 2008-04-28 LAB — COMPREHENSIVE METABOLIC PANEL
AST: 15 U/L (ref 0–37)
Albumin: 4.3 g/dL (ref 3.5–5.2)
Alkaline Phosphatase: 127 U/L — ABNORMAL HIGH (ref 39–117)
Calcium: 9.3 mg/dL (ref 8.4–10.5)
Chloride: 102 mEq/L (ref 96–112)
Glucose, Bld: 112 mg/dL — ABNORMAL HIGH (ref 70–99)
Potassium: 4.3 mEq/L (ref 3.5–5.3)
Sodium: 138 mEq/L (ref 135–145)
Total Protein: 6.6 g/dL (ref 6.0–8.3)

## 2008-04-28 LAB — PROTIME-INR
INR: 1.8 — ABNORMAL LOW (ref 2.00–3.50)
Protime: 21.6 Seconds — ABNORMAL HIGH (ref 10.6–13.4)

## 2008-04-28 LAB — CBC WITH DIFFERENTIAL/PLATELET
Basophils Absolute: 0 10*3/uL (ref 0.0–0.1)
Eosinophils Absolute: 0.1 10*3/uL (ref 0.0–0.5)
HGB: 15.7 g/dL (ref 13.0–17.1)
MCV: 91.6 fL (ref 79.3–98.0)
MONO%: 11.2 % (ref 0.0–14.0)
NEUT#: 2.9 10*3/uL (ref 1.5–6.5)
RBC: 4.87 10*6/uL (ref 4.20–5.82)
RDW: 13.8 % (ref 11.0–14.6)
WBC: 4.3 10*3/uL (ref 4.0–10.3)
lymph#: 0.8 10*3/uL — ABNORMAL LOW (ref 0.9–3.3)

## 2008-05-02 ENCOUNTER — Ambulatory Visit (HOSPITAL_COMMUNITY): Admission: RE | Admit: 2008-05-02 | Discharge: 2008-05-02 | Payer: Self-pay | Admitting: Internal Medicine

## 2008-05-06 ENCOUNTER — Encounter: Payer: Self-pay | Admitting: Gastroenterology

## 2008-05-06 ENCOUNTER — Encounter (INDEPENDENT_AMBULATORY_CARE_PROVIDER_SITE_OTHER): Payer: Self-pay | Admitting: *Deleted

## 2008-05-17 ENCOUNTER — Ambulatory Visit: Payer: Self-pay | Admitting: Internal Medicine

## 2008-05-26 ENCOUNTER — Emergency Department (HOSPITAL_COMMUNITY): Admission: EM | Admit: 2008-05-26 | Discharge: 2008-05-26 | Payer: Self-pay | Admitting: Emergency Medicine

## 2008-06-23 LAB — PROTIME-INR: Protime: 20.4 Seconds — ABNORMAL HIGH (ref 10.6–13.4)

## 2008-07-19 ENCOUNTER — Ambulatory Visit: Payer: Self-pay | Admitting: Internal Medicine

## 2008-07-27 LAB — PROTIME-INR
INR: 2.8 (ref 2.00–3.50)
Protime: 33.6 Seconds — ABNORMAL HIGH (ref 10.6–13.4)

## 2008-08-09 ENCOUNTER — Telehealth: Payer: Self-pay | Admitting: *Deleted

## 2008-08-23 ENCOUNTER — Ambulatory Visit: Payer: Self-pay | Admitting: Internal Medicine

## 2008-08-25 LAB — PROTIME-INR: Protime: 38.4 Seconds — ABNORMAL HIGH (ref 10.6–13.4)

## 2008-09-22 ENCOUNTER — Ambulatory Visit: Payer: Self-pay | Admitting: Internal Medicine

## 2008-09-22 LAB — PROTIME-INR: Protime: 37.2 Seconds — ABNORMAL HIGH (ref 10.6–13.4)

## 2008-10-27 ENCOUNTER — Ambulatory Visit: Payer: Self-pay | Admitting: Internal Medicine

## 2008-10-31 ENCOUNTER — Ambulatory Visit (HOSPITAL_COMMUNITY): Admission: RE | Admit: 2008-10-31 | Discharge: 2008-10-31 | Payer: Self-pay | Admitting: Internal Medicine

## 2008-10-31 LAB — COMPREHENSIVE METABOLIC PANEL
ALT: 16 U/L (ref 0–53)
AST: 17 U/L (ref 0–37)
Albumin: 3.8 g/dL (ref 3.5–5.2)
CO2: 29 mEq/L (ref 19–32)
Calcium: 9 mg/dL (ref 8.4–10.5)
Chloride: 100 mEq/L (ref 96–112)
Creatinine, Ser: 0.71 mg/dL (ref 0.40–1.50)
Potassium: 4.8 mEq/L (ref 3.5–5.3)
Total Protein: 6.8 g/dL (ref 6.0–8.3)

## 2008-10-31 LAB — CBC WITH DIFFERENTIAL/PLATELET
BASO%: 0.7 % (ref 0.0–2.0)
Basophils Absolute: 0 10*3/uL (ref 0.0–0.1)
EOS%: 4.5 % (ref 0.0–7.0)
HGB: 16.2 g/dL (ref 13.0–17.1)
MCH: 30.8 pg (ref 27.2–33.4)
MCHC: 34.6 g/dL (ref 32.0–36.0)
MCV: 89 fL (ref 79.3–98.0)
MONO%: 9.9 % (ref 0.0–14.0)
RDW: 13.2 % (ref 11.0–14.6)

## 2008-10-31 LAB — PROTIME-INR: INR: 2.5 (ref 2.00–3.50)

## 2008-11-03 ENCOUNTER — Encounter (INDEPENDENT_AMBULATORY_CARE_PROVIDER_SITE_OTHER): Payer: Self-pay | Admitting: Internal Medicine

## 2008-11-03 ENCOUNTER — Encounter: Payer: Self-pay | Admitting: Gastroenterology

## 2008-11-29 ENCOUNTER — Ambulatory Visit: Payer: Self-pay | Admitting: Internal Medicine

## 2008-12-01 LAB — PROTIME-INR
INR: 3.3 (ref 2.00–3.50)
Protime: 39.6 Seconds — ABNORMAL HIGH (ref 10.6–13.4)

## 2008-12-12 ENCOUNTER — Telehealth (INDEPENDENT_AMBULATORY_CARE_PROVIDER_SITE_OTHER): Payer: Self-pay | Admitting: *Deleted

## 2008-12-16 ENCOUNTER — Emergency Department (HOSPITAL_COMMUNITY): Admission: EM | Admit: 2008-12-16 | Discharge: 2008-12-16 | Payer: Self-pay | Admitting: Emergency Medicine

## 2008-12-29 ENCOUNTER — Ambulatory Visit: Payer: Self-pay | Admitting: Internal Medicine

## 2008-12-29 LAB — PROTIME-INR
INR: 2.4 (ref 2.00–3.50)
Protime: 28.8 Seconds — ABNORMAL HIGH (ref 10.6–13.4)

## 2009-01-05 ENCOUNTER — Ambulatory Visit: Payer: Self-pay | Admitting: Infectious Diseases

## 2009-01-05 LAB — CONVERTED CEMR LAB: INR: 3.3

## 2009-01-06 ENCOUNTER — Encounter: Payer: Self-pay | Admitting: Internal Medicine

## 2009-01-26 LAB — PROTIME-INR: INR: 1.6 — ABNORMAL LOW (ref 2.00–3.50)

## 2009-02-09 ENCOUNTER — Ambulatory Visit (HOSPITAL_COMMUNITY): Admission: RE | Admit: 2009-02-09 | Discharge: 2009-02-09 | Payer: Self-pay | Admitting: Gastroenterology

## 2009-02-17 ENCOUNTER — Telehealth: Payer: Self-pay | Admitting: Internal Medicine

## 2009-02-21 ENCOUNTER — Ambulatory Visit: Payer: Self-pay | Admitting: Internal Medicine

## 2009-02-23 LAB — PROTIME-INR: INR: 1.9 — ABNORMAL LOW (ref 2.00–3.50)

## 2009-03-20 ENCOUNTER — Ambulatory Visit: Payer: Self-pay | Admitting: Internal Medicine

## 2009-03-23 LAB — PROTIME-INR: INR: 2.3 (ref 2.00–3.50)

## 2009-04-17 ENCOUNTER — Telehealth: Payer: Self-pay | Admitting: Internal Medicine

## 2009-04-25 ENCOUNTER — Ambulatory Visit: Payer: Self-pay | Admitting: Internal Medicine

## 2009-04-27 ENCOUNTER — Ambulatory Visit (HOSPITAL_COMMUNITY): Admission: RE | Admit: 2009-04-27 | Discharge: 2009-04-27 | Payer: Self-pay | Admitting: Internal Medicine

## 2009-04-27 LAB — COMPREHENSIVE METABOLIC PANEL
AST: 16 U/L (ref 0–37)
Alkaline Phosphatase: 108 U/L (ref 39–117)
Glucose, Bld: 114 mg/dL — ABNORMAL HIGH (ref 70–99)
Sodium: 136 mEq/L (ref 135–145)
Total Bilirubin: 0.5 mg/dL (ref 0.3–1.2)
Total Protein: 6.9 g/dL (ref 6.0–8.3)

## 2009-04-27 LAB — CBC WITH DIFFERENTIAL/PLATELET
Basophils Absolute: 0 10*3/uL (ref 0.0–0.1)
Eosinophils Absolute: 0.1 10*3/uL (ref 0.0–0.5)
HGB: 15.8 g/dL (ref 13.0–17.1)
MONO#: 0.5 10*3/uL (ref 0.1–0.9)
MONO%: 12.1 % (ref 0.0–14.0)
NEUT#: 2.5 10*3/uL (ref 1.5–6.5)
RBC: 5.13 10*6/uL (ref 4.20–5.82)
RDW: 13.7 % (ref 11.0–14.6)
WBC: 3.8 10*3/uL — ABNORMAL LOW (ref 4.0–10.3)
lymph#: 0.7 10*3/uL — ABNORMAL LOW (ref 0.9–3.3)
nRBC: 0 % (ref 0–0)

## 2009-04-27 LAB — PROTIME-INR
INR: 2.8 (ref 2.00–3.50)
Protime: 33.6 Seconds — ABNORMAL HIGH (ref 10.6–13.4)

## 2009-05-04 ENCOUNTER — Encounter: Payer: Self-pay | Admitting: Gastroenterology

## 2009-05-04 ENCOUNTER — Encounter: Payer: Self-pay | Admitting: Internal Medicine

## 2009-05-31 ENCOUNTER — Ambulatory Visit: Payer: Self-pay | Admitting: Internal Medicine

## 2009-06-01 LAB — PROTIME-INR
INR: 3 (ref 2.00–3.50)
Protime: 36 Seconds — ABNORMAL HIGH (ref 10.6–13.4)

## 2009-06-07 ENCOUNTER — Emergency Department (HOSPITAL_COMMUNITY): Admission: EM | Admit: 2009-06-07 | Discharge: 2009-06-07 | Payer: Self-pay | Admitting: Family Medicine

## 2009-06-15 ENCOUNTER — Telehealth: Payer: Self-pay | Admitting: Internal Medicine

## 2009-06-29 LAB — PROTIME-INR: Protime: 25.2 Seconds — ABNORMAL HIGH (ref 10.6–13.4)

## 2009-07-02 ENCOUNTER — Inpatient Hospital Stay (HOSPITAL_COMMUNITY): Admission: EM | Admit: 2009-07-02 | Discharge: 2009-07-03 | Payer: Self-pay | Admitting: Emergency Medicine

## 2009-07-02 ENCOUNTER — Emergency Department (HOSPITAL_COMMUNITY): Admission: EM | Admit: 2009-07-02 | Discharge: 2009-07-02 | Payer: Self-pay | Admitting: Family Medicine

## 2009-07-05 ENCOUNTER — Encounter: Payer: Self-pay | Admitting: Internal Medicine

## 2009-07-05 ENCOUNTER — Ambulatory Visit: Payer: Self-pay | Admitting: Internal Medicine

## 2009-07-14 ENCOUNTER — Ambulatory Visit (HOSPITAL_COMMUNITY): Admission: RE | Admit: 2009-07-14 | Discharge: 2009-07-14 | Payer: Self-pay | Admitting: Internal Medicine

## 2009-07-19 ENCOUNTER — Encounter: Payer: Self-pay | Admitting: Internal Medicine

## 2009-07-19 LAB — COMPREHENSIVE METABOLIC PANEL
Alkaline Phosphatase: 131 U/L — ABNORMAL HIGH (ref 39–117)
CO2: 26 mEq/L (ref 19–32)
Creatinine, Ser: 0.83 mg/dL (ref 0.40–1.50)
Glucose, Bld: 154 mg/dL — ABNORMAL HIGH (ref 70–99)
Sodium: 135 mEq/L (ref 135–145)
Total Bilirubin: 0.3 mg/dL (ref 0.3–1.2)
Total Protein: 6.6 g/dL (ref 6.0–8.3)

## 2009-07-19 LAB — CBC WITH DIFFERENTIAL/PLATELET
BASO%: 0.2 % (ref 0.0–2.0)
HCT: 44 % (ref 38.4–49.9)
LYMPH%: 15 % (ref 14.0–49.0)
MCHC: 34.8 g/dL (ref 32.0–36.0)
MCV: 90.1 fL (ref 79.3–98.0)
MONO%: 8.4 % (ref 0.0–14.0)
NEUT%: 75 % (ref 39.0–75.0)
Platelets: 301 10*3/uL (ref 140–400)
RBC: 4.88 10*6/uL (ref 4.20–5.82)

## 2009-08-22 ENCOUNTER — Ambulatory Visit: Payer: Self-pay | Admitting: Internal Medicine

## 2009-08-24 LAB — PROTIME-INR
INR: 3.2 (ref 2.00–3.50)
Protime: 38.4 Seconds — ABNORMAL HIGH (ref 10.6–13.4)

## 2009-09-21 ENCOUNTER — Ambulatory Visit: Payer: Self-pay | Admitting: Internal Medicine

## 2009-09-21 LAB — PROTIME-INR
INR: 2.9 (ref 2.00–3.50)
Protime: 34.8 Seconds — ABNORMAL HIGH (ref 10.6–13.4)

## 2009-10-17 ENCOUNTER — Emergency Department (HOSPITAL_COMMUNITY): Admission: EM | Admit: 2009-10-17 | Discharge: 2009-10-17 | Payer: Self-pay | Admitting: Emergency Medicine

## 2009-10-19 LAB — PROTIME-INR: INR: 2.8 (ref 2.00–3.50)

## 2009-10-26 ENCOUNTER — Ambulatory Visit: Payer: Self-pay | Admitting: Internal Medicine

## 2009-10-30 ENCOUNTER — Ambulatory Visit (HOSPITAL_COMMUNITY)
Admission: RE | Admit: 2009-10-30 | Discharge: 2009-10-30 | Payer: Self-pay | Source: Home / Self Care | Admitting: Internal Medicine

## 2009-10-30 LAB — CBC WITH DIFFERENTIAL/PLATELET
Eosinophils Absolute: 0.2 10*3/uL (ref 0.0–0.5)
LYMPH%: 20.5 % (ref 14.0–49.0)
MONO#: 0.5 10*3/uL (ref 0.1–0.9)
NEUT#: 4 10*3/uL (ref 1.5–6.5)
Platelets: 257 10*3/uL (ref 140–400)
RBC: 5.02 10*6/uL (ref 4.20–5.82)
WBC: 5.9 10*3/uL (ref 4.0–10.3)
lymph#: 1.2 10*3/uL (ref 0.9–3.3)

## 2009-10-30 LAB — COMPREHENSIVE METABOLIC PANEL
Albumin: 3.6 g/dL (ref 3.5–5.2)
CO2: 30 mEq/L (ref 19–32)
Calcium: 9.4 mg/dL (ref 8.4–10.5)
Chloride: 101 mEq/L (ref 96–112)
Glucose, Bld: 116 mg/dL — ABNORMAL HIGH (ref 70–99)
Potassium: 4.4 mEq/L (ref 3.5–5.3)
Sodium: 138 mEq/L (ref 135–145)
Total Bilirubin: 0.7 mg/dL (ref 0.3–1.2)
Total Protein: 6.9 g/dL (ref 6.0–8.3)

## 2009-11-01 ENCOUNTER — Encounter: Payer: Self-pay | Admitting: Internal Medicine

## 2009-11-01 ENCOUNTER — Encounter: Payer: Self-pay | Admitting: Gastroenterology

## 2009-11-29 ENCOUNTER — Ambulatory Visit: Payer: Self-pay | Admitting: Internal Medicine

## 2010-01-01 ENCOUNTER — Ambulatory Visit: Payer: Self-pay | Admitting: Internal Medicine

## 2010-01-01 LAB — PROTIME-INR: Protime: 31.2 Seconds — ABNORMAL HIGH (ref 10.6–13.4)

## 2010-01-30 ENCOUNTER — Ambulatory Visit (HOSPITAL_COMMUNITY)
Admission: RE | Admit: 2010-01-30 | Discharge: 2010-01-30 | Payer: Self-pay | Source: Home / Self Care | Attending: Internal Medicine | Admitting: Internal Medicine

## 2010-01-30 LAB — CBC WITH DIFFERENTIAL/PLATELET
BASO%: 0.2 % (ref 0.0–2.0)
Basophils Absolute: 0 10*3/uL (ref 0.0–0.1)
EOS%: 1.2 % (ref 0.0–7.0)
Eosinophils Absolute: 0.1 10*3/uL (ref 0.0–0.5)
HCT: 47.4 % (ref 38.4–49.9)
HGB: 16.5 g/dL (ref 13.0–17.1)
LYMPH%: 17.3 % (ref 14.0–49.0)
MCH: 30.9 pg (ref 27.2–33.4)
MCHC: 34.8 g/dL (ref 32.0–36.0)
MCV: 88.8 fL (ref 79.3–98.0)
MONO#: 0.5 10*3/uL (ref 0.1–0.9)
MONO%: 10.1 % (ref 0.0–14.0)
NEUT#: 3.7 10*3/uL (ref 1.5–6.5)
NEUT%: 71.2 % (ref 39.0–75.0)
Platelets: 157 10*3/uL (ref 140–400)
RBC: 5.34 10*6/uL (ref 4.20–5.82)
RDW: 13.7 % (ref 11.0–14.6)
WBC: 5.1 10*3/uL (ref 4.0–10.3)
lymph#: 0.9 10*3/uL (ref 0.9–3.3)

## 2010-01-30 LAB — CMP (CANCER CENTER ONLY)
ALT(SGPT): 21 U/L (ref 10–47)
AST: 22 U/L (ref 11–38)
Albumin: 3.7 g/dL (ref 3.3–5.5)
Alkaline Phosphatase: 110 U/L — ABNORMAL HIGH (ref 26–84)
BUN, Bld: 8 mg/dL (ref 7–22)
CO2: 31 meq/L (ref 18–33)
Calcium: 9.2 mg/dL (ref 8.0–10.3)
Chloride: 100 meq/L (ref 98–108)
Creat: 0.8 mg/dL (ref 0.6–1.2)
Glucose, Bld: 115 mg/dL (ref 73–118)
Potassium: 5.3 meq/L — ABNORMAL HIGH (ref 3.3–4.7)
Sodium: 139 meq/L (ref 128–145)
Total Bilirubin: 0.4 mg/dL (ref 0.20–1.60)
Total Protein: 7.1 g/dL (ref 6.4–8.1)

## 2010-01-30 LAB — PROTIME-INR
INR: 2.2 (ref 2.00–3.50)
Protime: 26.4 s — ABNORMAL HIGH (ref 10.6–13.4)

## 2010-01-31 ENCOUNTER — Encounter: Payer: Self-pay | Admitting: Gastroenterology

## 2010-02-01 ENCOUNTER — Ambulatory Visit: Payer: Self-pay | Admitting: Internal Medicine

## 2010-02-10 ENCOUNTER — Other Ambulatory Visit: Payer: Self-pay | Admitting: Internal Medicine

## 2010-02-10 DIAGNOSIS — C3490 Malignant neoplasm of unspecified part of unspecified bronchus or lung: Secondary | ICD-10-CM

## 2010-02-11 ENCOUNTER — Encounter: Payer: Self-pay | Admitting: Internal Medicine

## 2010-02-22 NOTE — Consult Note (Signed)
Summary: [facility]  [facility]   Imported By: Louretta Parma 11/17/2009 16:48:10  _____________________________________________________________________  External Attachment:    Type:   Image     Comment:   External Document

## 2010-02-22 NOTE — Consult Note (Signed)
Summary: [facility]  [facility]   Imported By: Louretta Parma 08/03/2009 14:26:53  _____________________________________________________________________  External Attachment:    Type:   Image     Comment:   External Document

## 2010-02-22 NOTE — Progress Notes (Signed)
Summary: Refill/gh  Phone Note Refill Request Message from:  Fax from Pharmacy on February 17, 2009 2:16 PM  Refills Requested: Medication #1:  DILANTIN 100 MG  CAPS take 3 tablets everyday   Last Refilled: 01/12/2009 Labs and office visit 01/05/2009   Method Requested: Electronic Initial call taken by: Angelina Ok RN,  February 17, 2009 2:16 PM  Follow-up for Phone Call        Therapeutic Dilantin level 12/10.  WIll refill Follow-up by: Blanch Media MD,  February 17, 2009 2:21 PM    Prescriptions: DILANTIN 100 MG  CAPS (PHENYTOIN SODIUM EXTENDED) take 3 tablets everyday  #90 x 2   Entered and Authorized by:   Blanch Media MD   Signed by:   Blanch Media MD on 02/17/2009   Method used:   Electronically to        [facility] [phone]* (retail)       [address]       [place], [place]  [address]       Ph: [phone] or [phone]       Fax: [phone]   RxID:   [id number]

## 2010-02-22 NOTE — Progress Notes (Signed)
Summary: med refill/gp  Phone Note Refill Request Message from:  Fax from Pharmacy on Jun 15, 2009 9:16 AM  Refills Requested: Medication #1:  DILANTIN 100 MG  CAPS take 3 tablets everyday   Last Refilled: 05/14/2009  Method Requested: Electronic Initial call taken by: Chinita Pester RN,  Jun 15, 2009 9:16 AM  Follow-up for Phone Call        Refill approved Follow-up by: Bethel Born MD,  Jun 15, 2009 12:26 PM    Prescriptions: DILANTIN 100 MG  CAPS (PHENYTOIN SODIUM EXTENDED) take 3 tablets everyday  #90 x 2   Entered and Authorized by:   Bethel Born MD   Signed by:   Bethel Born MD on 06/15/2009   Method used:   [facility]ically to        [facility] [phone]* (retail)       [address]       [place], [place]  [address]       Ph: [phone] or [phone]       Fax: [phone]   RxID:   [id number]

## 2010-02-22 NOTE — Progress Notes (Signed)
Summary: refill/ hla  Phone Note Refill Request Message from:  Fax from Pharmacy on April 17, 2009 4:34 PM  Refills Requested: Medication #1:  DILANTIN 100 MG  CAPS take 3 tablets everyday   Last Refilled: 10/25 Initial call taken by: Marin Roberts RN,  April 17, 2009 4:35 PM    Prescriptions: DILANTIN 100 MG  CAPS (PHENYTOIN SODIUM EXTENDED) take 3 tablets everyday  #90 x 2   Entered and Authorized by:   Bethel Born MD   Signed by:   Bethel Born MD on 04/17/2009   Method used:   Electronically to        [facility] [phone]* (retail)       [address]       [place], [place]  [address]       Ph: [phone] or [phone]       Fax: [phone]   RxID:   [id number]

## 2010-02-22 NOTE — Letter (Signed)
Summary: [facility]  [facility]   Imported By: Margie Billet 06/06/2009 14:10:49  _____________________________________________________________________  External Attachment:    Type:   Image     Comment:   External Document

## 2010-02-22 NOTE — Letter (Signed)
[facility]   Imported By: Sherian Rein 06/05/2009 08:17:07  _____________________________________________________________________  External Attachment:    Type:   Image     Comment:   External Document

## 2010-02-22 NOTE — Consult Note (Signed)
Summary: [facility]  [facility]   Imported By: Louretta Parma 08/08/2009 11:25:33  _____________________________________________________________________  External Attachment:    Type:   Image     Comment:   External Document

## 2010-02-22 NOTE — Letter (Signed)
Summary: [facility]  [facility]   Imported By: Sherian Rein 11/20/2009 09:17:29  _____________________________________________________________________  External Attachment:    Type:   Image     Comment:   External Document

## 2010-02-28 ENCOUNTER — Encounter (HOSPITAL_BASED_OUTPATIENT_CLINIC_OR_DEPARTMENT_OTHER): Payer: Medicare Other | Admitting: Internal Medicine

## 2010-02-28 ENCOUNTER — Other Ambulatory Visit: Payer: Self-pay | Admitting: Internal Medicine

## 2010-02-28 DIAGNOSIS — Z7901 Long term (current) use of anticoagulants: Secondary | ICD-10-CM

## 2010-02-28 DIAGNOSIS — Z86718 Personal history of other venous thrombosis and embolism: Secondary | ICD-10-CM

## 2010-02-28 DIAGNOSIS — C3410 Malignant neoplasm of upper lobe, unspecified bronchus or lung: Secondary | ICD-10-CM

## 2010-02-28 NOTE — Letter (Signed)
Summary: [facility]  [facility]   Imported By: Sherian Rein 02/23/2010 11:26:12  _____________________________________________________________________  External Attachment:    Type:   Image     Comment:   External Document

## 2010-03-18 ENCOUNTER — Other Ambulatory Visit: Payer: Self-pay | Admitting: Internal Medicine

## 2010-03-19 ENCOUNTER — Encounter: Payer: Self-pay | Admitting: Internal Medicine

## 2010-03-28 ENCOUNTER — Other Ambulatory Visit: Payer: Self-pay | Admitting: Internal Medicine

## 2010-03-28 ENCOUNTER — Encounter (HOSPITAL_BASED_OUTPATIENT_CLINIC_OR_DEPARTMENT_OTHER): Payer: Medicare Other | Admitting: Internal Medicine

## 2010-03-28 DIAGNOSIS — Z7901 Long term (current) use of anticoagulants: Secondary | ICD-10-CM

## 2010-03-28 DIAGNOSIS — C3410 Malignant neoplasm of upper lobe, unspecified bronchus or lung: Secondary | ICD-10-CM

## 2010-03-28 DIAGNOSIS — Z86718 Personal history of other venous thrombosis and embolism: Secondary | ICD-10-CM

## 2010-03-28 LAB — PROTIME-INR: INR: 2 (ref 2.00–3.50)

## 2010-04-08 LAB — CBC
HCT: 43 % (ref 39.0–52.0)
MCHC: 33.8 g/dL (ref 30.0–36.0)
Platelets: 183 10*3/uL (ref 150–400)
RDW: 13.9 % (ref 11.5–15.5)

## 2010-04-08 LAB — COMPREHENSIVE METABOLIC PANEL
Albumin: 3.7 g/dL (ref 3.5–5.2)
Alkaline Phosphatase: 117 U/L (ref 39–117)
BUN: 7 mg/dL (ref 6–23)
Calcium: 8.7 mg/dL (ref 8.4–10.5)
Potassium: 4.3 mEq/L (ref 3.5–5.1)
Total Protein: 6.5 g/dL (ref 6.0–8.3)

## 2010-04-08 LAB — DIFFERENTIAL
Basophils Relative: 0 % (ref 0–1)
Lymphocytes Relative: 13 % (ref 12–46)
Lymphs Abs: 0.7 10*3/uL (ref 0.7–4.0)
Monocytes Absolute: 0.5 10*3/uL (ref 0.1–1.0)
Monocytes Relative: 10 % (ref 3–12)
Neutro Abs: 4.1 10*3/uL (ref 1.7–7.7)

## 2010-04-08 LAB — GLUCOSE, CAPILLARY: Glucose-Capillary: 117 mg/dL — ABNORMAL HIGH (ref 70–99)

## 2010-04-08 LAB — PROTIME-INR: INR: 1.06 (ref 0.00–1.49)

## 2010-04-09 LAB — CBC
HCT: 43 % (ref 39.0–52.0)
MCV: 92.6 fL (ref 78.0–100.0)
Platelets: 202 10*3/uL (ref 150–400)
RDW: 13.4 % (ref 11.5–15.5)
WBC: 11.3 10*3/uL — ABNORMAL HIGH (ref 4.0–10.5)

## 2010-04-09 LAB — DIFFERENTIAL
Eosinophils Absolute: 0 10*3/uL (ref 0.0–0.7)
Eosinophils Relative: 0 % (ref 0–5)
Lymphs Abs: 0.5 10*3/uL — ABNORMAL LOW (ref 0.7–4.0)

## 2010-04-09 LAB — BASIC METABOLIC PANEL
BUN: 9 mg/dL (ref 6–23)
Chloride: 98 mEq/L (ref 96–112)
Glucose, Bld: 148 mg/dL — ABNORMAL HIGH (ref 70–99)
Potassium: 3.5 mEq/L (ref 3.5–5.1)

## 2010-04-09 LAB — CULTURE, BLOOD (ROUTINE X 2): Culture: NO GROWTH

## 2010-04-09 LAB — CARDIAC PANEL(CRET KIN+CKTOT+MB+TROPI)
CK, MB: 2.6 ng/mL (ref 0.3–4.0)
Relative Index: 3.1 — ABNORMAL HIGH (ref 0.0–2.5)
Troponin I: 0.02 ng/mL (ref 0.00–0.06)

## 2010-04-09 LAB — POCT CARDIAC MARKERS
Myoglobin, poc: 245 ng/mL (ref 12–200)
Troponin i, poc: <0.05 ng/mL (ref 0.00–0.09)

## 2010-04-09 LAB — PROTIME-INR
INR: 2.25 — ABNORMAL HIGH (ref 0.00–1.49)
Prothrombin Time: 24.7 seconds — ABNORMAL HIGH (ref 11.6–15.2)

## 2010-04-09 LAB — D-DIMER, QUANTITATIVE: D-Dimer, Quant: 0.94 ug/mL-FEU — ABNORMAL HIGH (ref 0.00–0.48)

## 2010-04-09 LAB — CK TOTAL AND CKMB (NOT AT ARMC): Relative Index: INVALID (ref 0.0–2.5)

## 2010-04-25 ENCOUNTER — Encounter (HOSPITAL_BASED_OUTPATIENT_CLINIC_OR_DEPARTMENT_OTHER): Payer: Medicare Other | Admitting: Internal Medicine

## 2010-04-25 ENCOUNTER — Other Ambulatory Visit: Payer: Self-pay | Admitting: Internal Medicine

## 2010-04-25 DIAGNOSIS — Z5181 Encounter for therapeutic drug level monitoring: Secondary | ICD-10-CM

## 2010-04-25 DIAGNOSIS — Z7901 Long term (current) use of anticoagulants: Secondary | ICD-10-CM

## 2010-04-25 DIAGNOSIS — Z86718 Personal history of other venous thrombosis and embolism: Secondary | ICD-10-CM

## 2010-04-25 LAB — CBC
MCHC: 34.1 g/dL (ref 30.0–36.0)
MCV: 90.3 fL (ref 78.0–100.0)
RDW: 13.4 % (ref 11.5–15.5)

## 2010-04-25 LAB — COMPREHENSIVE METABOLIC PANEL
ALT: 18 U/L (ref 0–53)
AST: 19 U/L (ref 0–37)
Calcium: 8.9 mg/dL (ref 8.4–10.5)
Creatinine, Ser: 0.71 mg/dL (ref 0.4–1.5)
GFR calc Af Amer: >60 mL/min (ref >60)
Sodium: 136 mEq/L (ref 135–145)
Total Protein: 7.4 g/dL (ref 6.0–8.3)

## 2010-04-25 LAB — DIFFERENTIAL
Eosinophils Absolute: 0.1 10*3/uL (ref 0.0–0.7)
Eosinophils Relative: 2 % (ref 0–5)
Lymphocytes Relative: 14 % (ref 12–46)
Lymphs Abs: 0.9 10*3/uL (ref 0.7–4.0)
Monocytes Relative: 7 % (ref 3–12)
Neutrophils Relative %: 76 % (ref 43–77)

## 2010-04-25 LAB — PROTIME-INR
INR: 1.7 — ABNORMAL LOW (ref 2.00–3.50)
Protime: 20.4 Seconds — ABNORMAL HIGH (ref 10.6–13.4)

## 2010-04-25 LAB — LIPASE, BLOOD: Lipase: 17 U/L (ref 11–59)

## 2010-04-25 LAB — POCT CARDIAC MARKERS
CKMB, poc: 1.7 ng/mL (ref 1.0–8.0)
Troponin i, poc: <0.05 ng/mL (ref 0.00–0.09)

## 2010-05-01 LAB — COMPREHENSIVE METABOLIC PANEL
AST: 18 U/L (ref 0–37)
Albumin: 4.2 g/dL (ref 3.5–5.2)
BUN: 5 mg/dL — ABNORMAL LOW (ref 6–23)
Calcium: 9.3 mg/dL (ref 8.4–10.5)
Chloride: 103 mEq/L (ref 96–112)
Creatinine, Ser: 0.68 mg/dL (ref 0.4–1.5)
GFR calc Af Amer: >60 mL/min (ref >60)
Total Protein: 6.7 g/dL (ref 6.0–8.3)

## 2010-05-01 LAB — DIFFERENTIAL
Basophils Absolute: 0 10*3/uL (ref 0.0–0.1)
Eosinophils Relative: 3 % (ref 0–5)
Lymphocytes Relative: 19 % (ref 12–46)
Lymphs Abs: 1 10*3/uL (ref 0.7–4.0)
Monocytes Absolute: 0.6 10*3/uL (ref 0.1–1.0)
Monocytes Relative: 11 % (ref 3–12)
Neutro Abs: 3.7 10*3/uL (ref 1.7–7.7)

## 2010-05-01 LAB — CK TOTAL AND CKMB (NOT AT ARMC)
CK, MB: 2.4 ng/mL (ref 0.3–4.0)
Total CK: 71 U/L (ref 7–232)

## 2010-05-01 LAB — CBC
HCT: 46.3 % (ref 39.0–52.0)
Platelets: 216 10*3/uL (ref 150–400)
RDW: 13.5 % (ref 11.5–15.5)
WBC: 5.4 10*3/uL (ref 4.0–10.5)

## 2010-05-23 ENCOUNTER — Encounter (HOSPITAL_BASED_OUTPATIENT_CLINIC_OR_DEPARTMENT_OTHER): Payer: Medicare Other | Admitting: Internal Medicine

## 2010-05-23 ENCOUNTER — Other Ambulatory Visit: Payer: Self-pay | Admitting: Internal Medicine

## 2010-05-23 DIAGNOSIS — Z86718 Personal history of other venous thrombosis and embolism: Secondary | ICD-10-CM

## 2010-05-23 LAB — PROTIME-INR
INR: 2.7 (ref 2.00–3.50)
Protime: 32.4 Seconds — ABNORMAL HIGH (ref 10.6–13.4)

## 2010-06-08 NOTE — Op Note (Signed)
Paul [name], Paul [name] NO.:  [id number]   MEDICAL RECORD NO.:  [id number]          PATIENT TYPE:  OUT   LOCATION:  CARD                         FACILITY:  [facility]   PHYSICIAN:  Charlaine Dalton. Sherene Sires, M.D. [facility] OF BIRTH:  11/18/1930   DATE OF PROCEDURE:  12/06/2003  DATE OF DISCHARGE:                                 OPERATIVE REPORT   REFERRING PHYSICIAN:  Dr. Harriett Sine Phifer at [facility]   PROCEDURE:  Fiberoptic bronchoscopy with endobronchial biopsy of the right  lower lobe orifice.   HISTORY AND INDICATIONS:  Please see dictated office notes.  This patient  was seen at the request of Dr. Harvie Junior for possible right upper lobe  pneumonia with bronchial narrowing by CT scan.  He responded so dramatically  to a course of Levaquin, and chest x-ray appeared so much better that I  thought he probably did not have postobstructive pneumonia but within 5 days  of stopping his Levaquin, he called and said that he was having increasing  nonpleuritic right posterior pain which correlated with the area seen on CT  scan suggesting a possible lung mass.  I agreed to proceed directly with  bronchoscopy today after discussing the risks, benefits, and alternatives  with him.  There has been no change since the initial consult note.  He had  chest discomfort last night lying down but no discomfort today, had no  fever, purulent sputum, or hemoptysis.   The procedure was performed in the bronchoscopy suite with continuous  monitoring by surface ECG and oximetry.  He received 1% lidocaine by updraft  nebulizer and additional 2% lidocaine jelly to the right naris.   Using a standard flexible fiberoptic bronchoscope, the right naris was  easily cannulated with good visualization of the entire oropharynx and  larynx.  The cords moved normally, and there were no apparent upper airway  lesions.   Using additional 1% lidocaine as needed, the entire tracheobronchial tree  was explored  bilaterally with the following findings.  1.  The trachea, carina, and all of the airways bilaterally were normal      except for the right upper lobe.  2.  The right upper lobe orifice had an exophytic mass that completely      obstructed the orifice to the point where none of the segments could be      identified.   DESCRIPTION OF PROCEDURE:  Using a standard endobronchial forceps technique,  four biopsies were obtained within the mass obstructing the right upper  lobe.   The patient tolerated the procedure well with minimal bleeding encountered.  The right upper lobe was also lavaged.   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   RECOMMENDATIONS:  Await tissue confirmation.  Note that this patient's FEV1  is only 45% but, on the other hand, the right upper lobe is not likely to be  contributing to the spirometry that was obtained on November 22, 2003, based  on his appearance today; therefore, he might still be a candidate for right  upper  lobectomy.  Note that the  exophytic mass extended to the level of the takeoff of the right upper lobe,  leaving very little room for stump closure.  We will see the patient back in  follow up in the office to discuss the next step after the above studies are  available.      MBW/MEDQ  D:  12/06/2003  T:  12/06/2003  Job:  [id number]   cc:   Alvester Morin, M.D.  [address]  [place]  [place] [address]  Fax: [phone]

## 2010-06-08 NOTE — Letter (Signed)
August 29, 2005     Jaimie Redditt  [address]  [place], [place]  [address]   RE:  AUTHER, LYERLY  MRN:  [id number]  /  DOB:  11/18/1930   Dear Mr. Zirbes:   I am writing this letter to inform you that effective today, I have  withdrawn from your care and will be available to you only on an emergency  basis for the next 30 days.  I am sorry that this step was necessary, but it  was based on the inability to communicate with you the need to reconcile the  medications that you take against the medications that we have listed in our  chart that you take.  With repeated warnings that you failed to do so was  endangering your health, was compromising the quality of your care and also  potentially endangering your health.   I will be happy to send your records to any pulmonary physician you choose  outside of our group, but at this point, do not feel pulmonary specialty  care is needed and all of your needs could be managed through Dr. Ernestene Kiel  service, your primary physician of record.    Sincerely,      Casimiro Needle B. Sherene Sires, MD, [facility]   MBW/MedQ  DD:  08/29/2005  DT:  08/29/2005  Job #:  [id number]   CC:   Lajuana Matte, MD  Alvester Morin, MD

## 2010-06-08 NOTE — Assessment & Plan Note (Signed)
[facility]                               PULMONARY OFFICE NOTE   NAME:Paul [name], Paul [name]                   MRN:          [id number]  DATE:08/01/2005                            DOB:          11/18/1930    HISTORY:  A 77 year old white male active smoker with a diagnosis of non  small cell lung cancer obstructing the right upper lobe on initial  bronchoscopy November 2005 with evidence of persistent right hilar mass on  CT scan July 10, 2005, but overall improvement following radiation therapy  which was completed in January of 2006.  On his last visit I was concerned  that the patient was not taking the recommendation to stop smoking seriously  enough and was not able to keep up with his medicines and I offered to see  him back if he would commit to stop smoking (I did not ask him to stop  smoking, but to commit to the notion that he should stop smoking) and to  bring in either his medicines or the medicine calendar that we gave him on  his previous visit.  He did none of these today, but comes in complaining of  worsening dyspnea for the last three weeks associated with a hacking cough  productive of mucoid sputum and chest congestion and wheezing, especially  when he lies down at night.  He says he has been using his nebulizer up to  four times daily with two medicines (note that we have three different  medicines listed that he should be putting in the nebulizer but he does not  know what he is taking).  He has also been having some left upper quadrant  pain for the last year or so, better with Rolaids, never at bedtime and not  pleuritic in nature.  He denies any pleuritic pain, exertional chest pain,  fevers, chills, sweats, orthopnea, PND, or leg swelling.   PHYSICAL EXAMINATION:  GENERAL:  At times he is a hyperkinetic male who  actually had a very difficult time answering any questions in a  straightforward fashion and typically only  answered after the third  redirect.  He is afebrile with normal vital signs.  HEENT:  Unremarkable.  Pharynx clear.  LUNGS:  Lung fields reveal minimal rhonchi, right greater than left.  Overall air movement is diminished, but adequate.  HEART:  Regular rate and rhythm without murmurs, rubs, or gallops.  ABDOMEN:  Soft, benign.  EXTREMITIES:  Warm without calf tenderness, clubbing, cyanosis, edema.   Oxygen saturation 97% on room air.   CT scan reviewed from July 10, 2005 and indicates that he has a large hiatal  hernia with stable size and appearance of the right  perihilar/paramediastinal mass which is unchanged from previous study May 07, 2005.   IMPRESSION:  Chronic obstructive pulmonary disease/chronic bronchitis with  minimal asthmatic component in this patient who is actively smoking against  medical advice.  My concern is not so much that he is continuing to smoke,  but that he has not fully committed to smoking cessation.  The other issue  is that  I told him point blank that we could not treat him here in the  pulmonary clinic unless he was able to keep up with his medicines either by  bringing them in and/or bringing the medication calendar that was previously  provided by our nurse practitioner (note that there is an expressed  statement at the top stating why the calendar is important to keep up with  and bring to each visit which the patient has consistently failed to do).  When I showed the patient a copy of the calendar he said well, I'm no  doctor, and I can't keep up with my medicines the way a doctor could.  I  demonstrated for him that the medication calendar that we made for him is  extremely user friendly not written in doctor language and that without a  better medication reconciliation we could not prescribe medications for his  chronic obstructive pulmonary disease.  With this he became more irate  stating well then I will just sue you and walked out without  receiving a  prescription.   Under these circumstance, I do not believe he can be followed here in the  pulmonary clinic and will refer him back to the teaching service at [facility]  (Dr. Ernestene Kiel service) for referral to another pulmonary practice but he  will be discharged from our practice effective today.                                   Charlaine Dalton. Sherene Sires, MD, [facility]   MBW/MedQ  DD:  08/01/2005  DT:  08/01/2005  Job #:  [id number]   cc:   Lajuana Matte, MD

## 2010-06-13 ENCOUNTER — Inpatient Hospital Stay (INDEPENDENT_AMBULATORY_CARE_PROVIDER_SITE_OTHER)
Admission: RE | Admit: 2010-06-13 | Discharge: 2010-06-13 | Disposition: A | Discharge status: Home / Self Care | Payer: Medicare Other | Source: Ambulatory Visit | Attending: Emergency Medicine | Admitting: Emergency Medicine

## 2010-06-13 DIAGNOSIS — C7800 Secondary malignant neoplasm of unspecified lung: Secondary | ICD-10-CM

## 2010-06-13 DIAGNOSIS — J449 Chronic obstructive pulmonary disease, unspecified: Secondary | ICD-10-CM

## 2010-06-13 DIAGNOSIS — J069 Acute upper respiratory infection, unspecified: Secondary | ICD-10-CM

## 2010-06-20 ENCOUNTER — Other Ambulatory Visit: Payer: Self-pay | Admitting: Internal Medicine

## 2010-06-20 ENCOUNTER — Encounter (HOSPITAL_BASED_OUTPATIENT_CLINIC_OR_DEPARTMENT_OTHER): Payer: Medicare Other | Admitting: Internal Medicine

## 2010-06-20 DIAGNOSIS — C3410 Malignant neoplasm of upper lobe, unspecified bronchus or lung: Secondary | ICD-10-CM

## 2010-06-20 DIAGNOSIS — Z7901 Long term (current) use of anticoagulants: Secondary | ICD-10-CM

## 2010-06-20 DIAGNOSIS — Z86718 Personal history of other venous thrombosis and embolism: Secondary | ICD-10-CM

## 2010-06-20 LAB — PROTIME-INR: Protime: 36 Seconds — ABNORMAL HIGH (ref 10.6–13.4)

## 2010-07-18 ENCOUNTER — Encounter (HOSPITAL_BASED_OUTPATIENT_CLINIC_OR_DEPARTMENT_OTHER): Payer: Medicare Other | Admitting: Internal Medicine

## 2010-07-18 ENCOUNTER — Other Ambulatory Visit: Payer: Self-pay | Admitting: Internal Medicine

## 2010-07-18 DIAGNOSIS — Z7901 Long term (current) use of anticoagulants: Secondary | ICD-10-CM

## 2010-07-30 ENCOUNTER — Other Ambulatory Visit: Payer: Self-pay | Admitting: Internal Medicine

## 2010-07-30 ENCOUNTER — Encounter (HOSPITAL_COMMUNITY): Payer: Self-pay

## 2010-07-30 ENCOUNTER — Encounter (HOSPITAL_BASED_OUTPATIENT_CLINIC_OR_DEPARTMENT_OTHER): Payer: Medicare Other | Admitting: Internal Medicine

## 2010-07-30 ENCOUNTER — Ambulatory Visit (HOSPITAL_COMMUNITY)
Admission: RE | Admit: 2010-07-30 | Discharge: 2010-07-30 | Disposition: A | Discharge status: Home / Self Care | Payer: Medicare Other | Source: Ambulatory Visit | Attending: Internal Medicine | Admitting: Internal Medicine

## 2010-07-30 DIAGNOSIS — J984 Other disorders of lung: Secondary | ICD-10-CM | POA: Insufficient documentation

## 2010-07-30 DIAGNOSIS — C787 Secondary malignant neoplasm of liver and intrahepatic bile duct: Secondary | ICD-10-CM

## 2010-07-30 DIAGNOSIS — J438 Other emphysema: Secondary | ICD-10-CM | POA: Insufficient documentation

## 2010-07-30 DIAGNOSIS — M479 Spondylosis, unspecified: Secondary | ICD-10-CM | POA: Insufficient documentation

## 2010-07-30 DIAGNOSIS — M4120 Other idiopathic scoliosis, site unspecified: Secondary | ICD-10-CM | POA: Insufficient documentation

## 2010-07-30 DIAGNOSIS — K449 Diaphragmatic hernia without obstruction or gangrene: Secondary | ICD-10-CM | POA: Insufficient documentation

## 2010-07-30 DIAGNOSIS — C3490 Malignant neoplasm of unspecified part of unspecified bronchus or lung: Secondary | ICD-10-CM | POA: Insufficient documentation

## 2010-07-30 LAB — CMP (CANCER CENTER ONLY)
ALT(SGPT): 17 U/L (ref 10–47)
AST: 17 U/L (ref 11–38)
Alkaline Phosphatase: 103 U/L — ABNORMAL HIGH (ref 26–84)
Creat: 0.7 mg/dl (ref 0.6–1.2)
Sodium: 140 mEq/L (ref 128–145)
Total Bilirubin: 0.4 mg/dl (ref 0.20–1.60)
Total Protein: 6.6 g/dL (ref 6.4–8.1)

## 2010-07-30 LAB — CBC WITH DIFFERENTIAL/PLATELET
Eosinophils Absolute: 0.1 10*3/uL (ref 0.0–0.5)
HCT: 46.3 % (ref 38.4–49.9)
LYMPH%: 20 % (ref 14.0–49.0)
MONO#: 0.5 10*3/uL (ref 0.1–0.9)
NEUT#: 3.4 10*3/uL (ref 1.5–6.5)
NEUT%: 66 % (ref 39.0–75.0)
Platelets: 187 10*3/uL (ref 140–400)
WBC: 5.1 10*3/uL (ref 4.0–10.3)

## 2010-07-30 MED ORDER — IOHEXOL 300 MG/ML  SOLN
80.0000 mL | Freq: Once | INTRAMUSCULAR | Status: AC | PRN
  Administered 2010-07-30: 80 mL via INTRAVENOUS

## 2010-08-01 ENCOUNTER — Encounter (HOSPITAL_BASED_OUTPATIENT_CLINIC_OR_DEPARTMENT_OTHER): Payer: Medicare Other | Admitting: Internal Medicine

## 2010-08-01 DIAGNOSIS — Z7901 Long term (current) use of anticoagulants: Secondary | ICD-10-CM

## 2010-08-01 DIAGNOSIS — Z86718 Personal history of other venous thrombosis and embolism: Secondary | ICD-10-CM

## 2010-08-01 DIAGNOSIS — C3410 Malignant neoplasm of upper lobe, unspecified bronchus or lung: Secondary | ICD-10-CM

## 2010-08-29 ENCOUNTER — Other Ambulatory Visit: Payer: Self-pay | Admitting: Internal Medicine

## 2010-08-29 ENCOUNTER — Encounter (HOSPITAL_BASED_OUTPATIENT_CLINIC_OR_DEPARTMENT_OTHER): Payer: Medicare Other | Admitting: Internal Medicine

## 2010-08-29 DIAGNOSIS — Z86718 Personal history of other venous thrombosis and embolism: Secondary | ICD-10-CM

## 2010-08-29 LAB — PROTIME-INR: Protime: 26.4 Seconds — ABNORMAL HIGH (ref 10.6–13.4)

## 2010-09-11 ENCOUNTER — Other Ambulatory Visit: Payer: Self-pay | Admitting: Internal Medicine

## 2010-09-26 ENCOUNTER — Encounter: Payer: Medicare Other | Admitting: Internal Medicine

## 2010-09-26 ENCOUNTER — Other Ambulatory Visit: Payer: Self-pay | Admitting: Internal Medicine

## 2010-09-26 DIAGNOSIS — Z7901 Long term (current) use of anticoagulants: Secondary | ICD-10-CM

## 2010-09-26 DIAGNOSIS — C3410 Malignant neoplasm of upper lobe, unspecified bronchus or lung: Secondary | ICD-10-CM

## 2010-09-26 LAB — PROTIME-INR: Protime: 31.2 Seconds — ABNORMAL HIGH (ref 10.6–13.4)

## 2010-10-24 ENCOUNTER — Encounter (HOSPITAL_BASED_OUTPATIENT_CLINIC_OR_DEPARTMENT_OTHER): Payer: Medicare Other | Admitting: Internal Medicine

## 2010-10-24 ENCOUNTER — Other Ambulatory Visit: Payer: Self-pay | Admitting: Internal Medicine

## 2010-10-24 DIAGNOSIS — Z86718 Personal history of other venous thrombosis and embolism: Secondary | ICD-10-CM

## 2010-10-24 DIAGNOSIS — C3410 Malignant neoplasm of upper lobe, unspecified bronchus or lung: Secondary | ICD-10-CM

## 2010-10-24 DIAGNOSIS — Z7901 Long term (current) use of anticoagulants: Secondary | ICD-10-CM

## 2010-10-24 LAB — PROTIME-INR: INR: 2.4 (ref 2.00–3.50)

## 2010-11-21 ENCOUNTER — Other Ambulatory Visit: Payer: Self-pay | Admitting: Internal Medicine

## 2010-11-21 ENCOUNTER — Encounter (HOSPITAL_BASED_OUTPATIENT_CLINIC_OR_DEPARTMENT_OTHER): Payer: Medicare Other | Admitting: Internal Medicine

## 2010-11-21 DIAGNOSIS — C3410 Malignant neoplasm of upper lobe, unspecified bronchus or lung: Secondary | ICD-10-CM

## 2010-11-21 DIAGNOSIS — Z7901 Long term (current) use of anticoagulants: Secondary | ICD-10-CM

## 2010-11-21 DIAGNOSIS — Z86718 Personal history of other venous thrombosis and embolism: Secondary | ICD-10-CM

## 2010-11-21 LAB — PROTIME-INR: INR: 3.7 — ABNORMAL HIGH (ref 2.00–3.50)

## 2010-11-30 ENCOUNTER — Telehealth: Payer: Self-pay | Admitting: Internal Medicine

## 2010-11-30 NOTE — Telephone Encounter (Signed)
Called patient's phone number, no answer

## 2010-12-19 ENCOUNTER — Other Ambulatory Visit: Payer: Self-pay | Admitting: Internal Medicine

## 2010-12-19 ENCOUNTER — Other Ambulatory Visit (HOSPITAL_BASED_OUTPATIENT_CLINIC_OR_DEPARTMENT_OTHER): Payer: Medicare Other | Admitting: Lab

## 2010-12-19 DIAGNOSIS — Z86718 Personal history of other venous thrombosis and embolism: Secondary | ICD-10-CM

## 2010-12-19 DIAGNOSIS — Z7901 Long term (current) use of anticoagulants: Secondary | ICD-10-CM

## 2010-12-19 DIAGNOSIS — Z5181 Encounter for therapeutic drug level monitoring: Secondary | ICD-10-CM

## 2010-12-19 LAB — PROTIME-INR

## 2010-12-20 ENCOUNTER — Other Ambulatory Visit: Payer: Self-pay | Admitting: Dermatology

## 2011-01-10 ENCOUNTER — Emergency Department (INDEPENDENT_AMBULATORY_CARE_PROVIDER_SITE_OTHER)
Admission: EM | Admit: 2011-01-10 | Discharge: 2011-01-10 | Disposition: A | Discharge status: Home / Self Care | Payer: Medicare Other | Source: Home / Self Care | Attending: Family Medicine | Admitting: Family Medicine

## 2011-01-10 ENCOUNTER — Encounter (HOSPITAL_COMMUNITY): Payer: Self-pay | Admitting: Emergency Medicine

## 2011-01-10 DIAGNOSIS — J189 Pneumonia, unspecified organism: Secondary | ICD-10-CM

## 2011-01-10 MED ORDER — INFLUENZA VIRUS VACC SPLIT PF IM SUSP
0.5000 mL | Freq: Once | INTRAMUSCULAR | Status: AC
  Administered 2011-01-10: 0.5 mL via INTRAMUSCULAR

## 2011-01-10 MED ORDER — AZITHROMYCIN 250 MG PO TABS: 250.0000 mg | ORAL_TABLET | Freq: Every day | ORAL | Status: AC

## 2011-01-10 MED ORDER — AMOXICILLIN 500 MG PO CAPS: 1000.0000 mg | ORAL_CAPSULE | Freq: Three times a day (TID) | ORAL | Status: AC

## 2011-01-10 NOTE — ED Notes (Signed)
Pt here with c/o uri sx that started x 6 dys ago with h/a,yellow thick nasal drainage and poor appetite.no fevers,vomiting reported but diarrhea cleared up.pt has hx lung CA s/p chemo/radiation.audible wheeze right lobe.pt still smoking

## 2011-01-10 NOTE — ED Provider Notes (Signed)
History     CSN: [id number]  Arrival date & time 01/10/11  6962   First MD Initiated Contact with Patient 01/10/11 1020      Chief Complaint  Patient presents with  . URI  . Cough    (Consider location/radiation/quality/duration/timing/severity/associated sxs/prior treatment) HPI Comments: Paul [name] presents for evaluation of persistent productive cough since Saturday. He denies any fever. He has been using cough drops without relief.   Patient is a 77 y.o. male presenting with cough. The history is provided by the patient.  Cough This is a new problem. The cough is productive of sputum. There has been no fever. Associated symptoms include shortness of breath. He is a smoker. His past medical history is significant for COPD and emphysema.    Past Medical History  Diagnosis Date  . Hypertension   . COPD (chronic obstructive pulmonary disease)   . Hemorrhoids     external  . 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 GERD (gastroesophageal reflux disease)   . Non-small cell carcinoma of lung dx'd 2006    stage 3  . Emphysema     Past Surgical History  Procedure Date  . Inner ear surgery     No family history on file.  History  Substance Use Topics  . Smoking status: Current Everyday Smoker  . Smokeless tobacco: Not on file  . Alcohol Use: No      Review of Systems  Constitutional: Negative.   HENT: Negative.   Eyes: Negative.   Respiratory: Positive for cough and shortness of breath.   Cardiovascular: Negative.   Gastrointestinal: Negative.   Genitourinary: Negative.   Musculoskeletal: Negative.   Skin: Negative.   Neurological: Negative.     Allergies  Review of patient's allergies indicates no known allergies.  Home Medications   Current Outpatient Rx  Name Route Sig Dispense Refill  . PHENYTOIN SODIUM EXTENDED 100 MG PO CAPS  TAKE 3 CAPSULES BY MOUTH EVERY DAY 90 capsule 5  . WARFARIN SODIUM 5 MG PO TABS Oral Take 5 mg by mouth daily.       . AMOXICILLIN 500 MG PO CAPS Oral Take 2 capsules (1,000 mg total) by mouth 3 (three) times daily. 15 capsule 0  . AZITHROMYCIN 250 MG PO TABS Oral Take 1 tablet (250 mg total) by mouth daily. Take two tablets on first day, then one tablet each day for four days 6 tablet 0  . PHENYTOIN SODIUM EXTENDED 100 MG PO CAPS Oral Take by mouth 3 (three) times daily.        BP 146/69  Pulse 99  Temp(Src) 98.3 F (36.8 C) (Oral)  Resp 22  SpO2 99%  Physical Exam  Nursing note and vitals reviewed. Constitutional: He is oriented to person, place, and time. He appears well-developed and well-nourished.  HENT:  Head: Normocephalic.    Right Ear: Tympanic membrane and external ear normal.  Left Ear: Tympanic membrane and external ear normal.  Mouth/Throat: Uvula is midline, oropharynx is clear and moist and mucous membranes are normal. No oropharyngeal exudate, posterior oropharyngeal edema or posterior oropharyngeal erythema.  Eyes: Conjunctivae and EOM are normal. Pupils are equal, round, and reactive to light.  Neck: Normal range of motion. Neck supple.  Cardiovascular: Normal rate and regular rhythm.   Pulmonary/Chest: Effort normal. He has wheezes in the left lower field. He has rhonchi in the right middle field and the right lower field. He has no rales.  Neurological: He  is alert and oriented to person, place, and time.  Skin: Skin is warm and dry.    ED Course  Procedures (including critical care time)  Labs Reviewed - No data to display No results found.   1. Pneumonia       MDM          Richardo Priest, MD 01/10/11 1208

## 2011-01-16 ENCOUNTER — Other Ambulatory Visit: Payer: Self-pay | Admitting: Internal Medicine

## 2011-01-16 ENCOUNTER — Other Ambulatory Visit (HOSPITAL_BASED_OUTPATIENT_CLINIC_OR_DEPARTMENT_OTHER): Payer: Medicare Other | Admitting: Lab

## 2011-01-16 DIAGNOSIS — Z5181 Encounter for therapeutic drug level monitoring: Secondary | ICD-10-CM

## 2011-01-16 DIAGNOSIS — Z7901 Long term (current) use of anticoagulants: Secondary | ICD-10-CM

## 2011-01-16 DIAGNOSIS — Z86718 Personal history of other venous thrombosis and embolism: Secondary | ICD-10-CM

## 2011-01-16 LAB — PROTIME-INR

## 2011-01-18 ENCOUNTER — Other Ambulatory Visit: Payer: Self-pay | Admitting: Internal Medicine

## 2011-01-18 ENCOUNTER — Telehealth: Payer: Self-pay | Admitting: Internal Medicine

## 2011-01-18 DIAGNOSIS — C3490 Malignant neoplasm of unspecified part of unspecified bronchus or lung: Secondary | ICD-10-CM

## 2011-01-18 NOTE — Telephone Encounter (Signed)
S/w pt today re appts for lb/ct 1/25 and MM 1/29 (mosaiq - 08/01/10 pof).

## 2011-01-19 ENCOUNTER — Other Ambulatory Visit: Payer: Self-pay | Admitting: Internal Medicine

## 2011-01-19 DIAGNOSIS — C3490 Malignant neoplasm of unspecified part of unspecified bronchus or lung: Secondary | ICD-10-CM

## 2011-02-15 ENCOUNTER — Encounter (HOSPITAL_COMMUNITY): Payer: Self-pay

## 2011-02-15 ENCOUNTER — Ambulatory Visit (HOSPITAL_COMMUNITY)
Admission: RE | Admit: 2011-02-15 | Discharge: 2011-02-15 | Disposition: A | Discharge status: Home / Self Care | Payer: Medicare Other | Source: Ambulatory Visit | Attending: Internal Medicine | Admitting: Internal Medicine

## 2011-02-15 ENCOUNTER — Other Ambulatory Visit (HOSPITAL_BASED_OUTPATIENT_CLINIC_OR_DEPARTMENT_OTHER): Payer: Medicare Other | Admitting: Lab

## 2011-02-15 ENCOUNTER — Encounter: Payer: Self-pay | Admitting: Internal Medicine

## 2011-02-15 DIAGNOSIS — I700 Atherosclerosis of aorta: Secondary | ICD-10-CM | POA: Insufficient documentation

## 2011-02-15 DIAGNOSIS — C3410 Malignant neoplasm of upper lobe, unspecified bronchus or lung: Secondary | ICD-10-CM

## 2011-02-15 DIAGNOSIS — C3490 Malignant neoplasm of unspecified part of unspecified bronchus or lung: Secondary | ICD-10-CM | POA: Insufficient documentation

## 2011-02-15 DIAGNOSIS — K449 Diaphragmatic hernia without obstruction or gangrene: Secondary | ICD-10-CM | POA: Insufficient documentation

## 2011-02-15 DIAGNOSIS — I2510 Atherosclerotic heart disease of native coronary artery without angina pectoris: Secondary | ICD-10-CM | POA: Insufficient documentation

## 2011-02-15 DIAGNOSIS — R161 Splenomegaly, not elsewhere classified: Secondary | ICD-10-CM | POA: Insufficient documentation

## 2011-02-15 DIAGNOSIS — J438 Other emphysema: Secondary | ICD-10-CM | POA: Insufficient documentation

## 2011-02-15 DIAGNOSIS — Z9221 Personal history of antineoplastic chemotherapy: Secondary | ICD-10-CM | POA: Insufficient documentation

## 2011-02-15 DIAGNOSIS — Z7901 Long term (current) use of anticoagulants: Secondary | ICD-10-CM

## 2011-02-15 LAB — CBC WITH DIFFERENTIAL/PLATELET
Basophils Absolute: 0 10*3/uL (ref 0.0–0.1)
Eosinophils Absolute: 0.1 10*3/uL (ref 0.0–0.5)
HCT: 46 % (ref 38.4–49.9)
LYMPH%: 15.5 % (ref 14.0–49.0)
MCHC: 34.8 g/dL (ref 32.0–36.0)
MONO#: 0.5 10*3/uL (ref 0.1–0.9)
NEUT#: 3.1 10*3/uL (ref 1.5–6.5)
NEUT%: 70.4 % (ref 39.0–75.0)
Platelets: 170 10*3/uL (ref 140–400)
WBC: 4.4 10*3/uL (ref 4.0–10.3)

## 2011-02-15 LAB — CMP (CANCER CENTER ONLY)
BUN, Bld: 8 mg/dL (ref 7–22)
CO2: 29 mEq/L (ref 18–33)
Calcium: 8.8 mg/dL (ref 8.0–10.3)
Chloride: 99 mEq/L (ref 98–108)
Creat: 0.5 mg/dl — ABNORMAL LOW (ref 0.6–1.2)
Glucose, Bld: 137 mg/dL — ABNORMAL HIGH (ref 73–118)

## 2011-02-15 MED ORDER — IOHEXOL 300 MG/ML  SOLN
80.0000 mL | Freq: Once | INTRAMUSCULAR | Status: AC | PRN
  Administered 2011-02-15: 80 mL via INTRAVENOUS

## 2011-02-15 NOTE — Progress Notes (Unsigned)
Pt walked in insisting he was told to come in and get his labs early and CT. I confirmed this with CT.  Pt was upset that he could not go in front of everyone else ahead of him to register.  I told pt he has to register for labs first and that we are taking him early to please be patient. He was worried about the weather and I told him we are getting him as fast as we can. Pt acknowledged that he was anxious about weather. HE went back to waiting room

## 2011-02-19 ENCOUNTER — Ambulatory Visit (HOSPITAL_BASED_OUTPATIENT_CLINIC_OR_DEPARTMENT_OTHER): Payer: Medicare Other | Admitting: Internal Medicine

## 2011-02-19 VITALS — BP 140/82 | HR 91 | Temp 97.1°F | Ht 70.5 in | Wt 161.3 lb

## 2011-02-19 DIAGNOSIS — C3490 Malignant neoplasm of unspecified part of unspecified bronchus or lung: Secondary | ICD-10-CM

## 2011-02-19 DIAGNOSIS — Z7901 Long term (current) use of anticoagulants: Secondary | ICD-10-CM

## 2011-02-19 DIAGNOSIS — Z86718 Personal history of other venous thrombosis and embolism: Secondary | ICD-10-CM

## 2011-02-19 DIAGNOSIS — I82409 Acute embolism and thrombosis of unspecified deep veins of unspecified lower extremity: Secondary | ICD-10-CM

## 2011-02-19 NOTE — Progress Notes (Signed)
[facility] OFFICE PROGRESS NOTE   PRINCIPAL DIAGNOSES:   1. Stage IIIA non-small cell lung cancer diagnosed in November 2005 with disease recurrence in February 2007. 2. History of pulmonary emboli diagnosed incidentally on CT scan of the chest in May 2006. 3. Right lower extremity deep venous thrombosis diagnosed in August 2007 and the patient was off anticoagulation at that time.  PRIOR THERAPY:   1. Status post concurrent chemoradiation with weekly carboplatin and paclitaxel.  Last dose was given February 07, 2004. 2. Status post 3 cycles of consolidation chemotherapy with docetaxel.  Last dose was given April 04, 2004. 3. Status post 12 cycles of single-agent Alimta for disease recurrence.  Last dose was given December 26, 2005 and the patient has disease stabilization since that time.  CURRENT THERAPY.:   1. Observation for lung cancer. 2. Coumadin 2.5 mg p.o. daily except Monday and Thursday the patient on 5 mg for history of deep venous thrombosis and pulmonary emboli.  INTERVAL HISTORY: Paul [name] 78 y.o. male returns to the clinic today for his routine six-month followup visit. The patient has no complaints today except for shortness breath with exertion. He continues to smoke and unfortunately unwilling to quit. He denied having any significant chest pain, no hemoptysis. She lost around 5 pounds since his last visit. He still on Coumadin for his recurrent DVT and pulmonary emboli. The patient has repeat CT scan of the chest performed recently and he is here today for evaluation and discussion of his scan results.  MEDICAL HISTORY: Past Medical History  Diagnosis Date  . Hypertension   . COPD (chronic obstructive pulmonary disease)   . Hemorrhoids     external  . 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 GERD (gastroesophageal reflux disease)   . Emphysema   . Non-small cell carcinoma of lung dx'd 2006    stage 3    ALLERGIES:   has no  known allergies.  MEDICATIONS:  Current Outpatient Prescriptions  Medication Sig Dispense Refill  . phenytoin (DILANTIN) 100 MG ER capsule Take by mouth 3 (three) times daily.        . phenytoin (DILANTIN) 100 MG ER capsule TAKE 3 CAPSULES BY MOUTH EVERY DAY  90 capsule  5  . warfarin (COUMADIN) 5 MG tablet TAKE 1 TABLET BY MOUTH EVERY DAY  30 tablet  3    SURGICAL HISTORY:  Past Surgical History  Procedure Date  . Inner ear surgery     REVIEW OF SYSTEMS:  A comprehensive review of systems was negative except for: Respiratory: positive for dyspnea on exertion   PHYSICAL EXAMINATION: General appearance: alert, cooperative and no distress Resp: clear to auscultation bilaterally Cardio: regular rate and rhythm, S1, S2 normal, no murmur, click, rub or gallop GI: soft, non-tender; bowel sounds normal; no masses,  no organomegaly Extremities: extremities normal, atraumatic, no cyanosis or edema  ECOG PERFORMANCE STATUS: 1 - Symptomatic but completely ambulatory  Blood pressure 140/82, pulse 91, temperature 97.1 F (36.2 C), temperature source Oral, height 5' 10.5" (1.791 m), weight 161 lb 4.8 oz (73.165 kg).  LABORATORY DATA: Lab Results  Component Value Date   WBC 4.4 02/15/2011   HGB 16.0 02/15/2011   HCT 46.0 02/15/2011   MCV 87.8 02/15/2011   PLT 170 02/15/2011      Chemistry      Component Value Date/Time   NA 142 02/15/2011 0956   NA  138 10/30/2009 0901   K 4.4 02/15/2011 0956   K 4.4 10/30/2009 0901   CL 99 02/15/2011 0956   CL 101 10/30/2009 0901   CO2 29 02/15/2011 0956   CO2 30 10/30/2009 0901   BUN 8 02/15/2011 0956   BUN 5* 10/30/2009 0901   CREATININE 0.5* 02/15/2011 0956   CREATININE 0.69 10/30/2009 0901      Component Value Date/Time   CALCIUM 8.8 02/15/2011 0956   CALCIUM 9.4 10/30/2009 0901   ALKPHOS 109* 02/15/2011 0956   ALKPHOS 123* 10/30/2009 0901   AST 18 02/15/2011 0956   AST 18 10/30/2009 0901   ALT 16 10/30/2009 0901   BILITOT 0.50 02/15/2011 0956    BILITOT 0.7 10/30/2009 0901       RADIOGRAPHIC STUDIES: Ct Chest W Contrast  02/15/2011  *RADIOLOGY REPORT*  Clinical Data: History of lung cancer diagnosed in 2006 status post completion of chemotherapy and June 2008.  CT CHEST WITH CONTRAST  Technique:  Multidetector CT imaging of the chest was performed following the standard protocol during bolus administration of intravenous contrast.  Contrast: 80mL OMNIPAQUE IOHEXOL 300 MG/ML IV SOLN  Comparison: Prior chest CT 07/30/2010.  Findings:  Mediastinum:  Heart size is normal.  No pericardial fluid, thickening or calcification.  No acute abnormality of the thoracic aorta or other great vessels of the mediastinum. There is atherosclerosis of the thoracic aorta, the great vessels of the mediastinum and the coronary arteries, including calcified atherosclerotic plaque in the LAD, left circumflex and right coronary arteries. There may also be a stent in the proximal LAD coronary artery.  No filling defects are noted within the central pulmonary arterial tree to suggest large pulmonary emboli.  No pathologically enlarged mediastinal or hilar lymph nodes. Prominent soft tissue around the right upper lobe bronchus is completely unchanged compared to prior examinations.  There is a moderate hiatal hernia.   Lungs/Pleura: Moderate-severe centrilobular emphysema.  Scattered throughout the periphery of the right upper lobe and particularly there are multi focal centrilobular ground-glass attenuation micronodules which are unchanged compared to prior study, and likely reflect areas of chronic mucoid impaction.  The chronic mass- like opacity in the perihilar region of the right upper lobe is unchanged in size and appearance compared to multiple prior examinations (it measures nearly identical to remote prior from 10/30/2009, currently 6.6 x 6.3 cm (measured on image 22 of 67)). 5 mm nodule in the periphery of the left lower lobe (image 56 of series 5) is unchanged  compared to prior study 10/31/2008, and can be considered radiographically benign at this time. No other definite new or enlarging suspicious appearing pulmonary nodules or masses are otherwise identified at this time.  No consolidative airspace disease.  No pleural effusions.  Upper Abdomen: Mild splenomegaly (AP dimension of the spleen measures 15.3 cm), unchanged.  Musculoskeletal:  No aggressive appearing lytic or blastic lesions are noted in the visualized portions of the skeleton.  IMPRESSION:  1.  Unchanged size and appearance of chronic mass-like opacity in the perihilar region of the right upper lobe compared to prior multiple prior examinations.  No findings on today's examination to suggest local recurrence or new metastatic disease in the thorax. 2. Atherosclerosis, including three-vessel coronary artery disease. Please note that although the presence of coronary artery calcium documents the presence of coronary artery disease, the severity of this disease and any potential stenosis cannot be assessed on this non-gated CT examination.  Assessment for potential risk factor modification, dietary therapy or  pharmacologic therapy may be warranted, if clinically indicated. 3.  Mild splenomegaly. 4.  Moderate-severe centrilobular emphysema redemonstrated.  Original Report Authenticated By: Florencia Reasons, M.D.    ASSESSMENT:  #1 Recurrent non-small cell lung cancer. The patient has no evidence for disease progression on the CT scan. #2 history of DVT and pulmonary emboli.  PLAN:  #1 non-small cell lung cancer: The patient will continue on observation for now with repeat CT scan of the chest with contrast in 6 months. #2 DVT and pulmonary emboli: The patient will continue on Coumadin 5 mg by mouth daily except Monday and Thursday is taking 2.5 mg by mouth.  The patient was advised to call me immediately if he has any concerning symptoms in the interval.  All questions were answered. The patient  knows to call the clinic with any problems, questions or concerns. We can certainly see the patient much sooner if necessary.

## 2011-03-14 ENCOUNTER — Other Ambulatory Visit: Payer: Self-pay | Admitting: Internal Medicine

## 2011-03-21 ENCOUNTER — Other Ambulatory Visit (HOSPITAL_BASED_OUTPATIENT_CLINIC_OR_DEPARTMENT_OTHER): Payer: Medicare Other | Admitting: Lab

## 2011-03-21 DIAGNOSIS — I82409 Acute embolism and thrombosis of unspecified deep veins of unspecified lower extremity: Secondary | ICD-10-CM

## 2011-03-21 DIAGNOSIS — C3490 Malignant neoplasm of unspecified part of unspecified bronchus or lung: Secondary | ICD-10-CM

## 2011-03-21 LAB — PROTIME-INR: Protime: 37.2 Seconds — ABNORMAL HIGH (ref 10.6–13.4)

## 2011-03-30 ENCOUNTER — Other Ambulatory Visit: Payer: Self-pay | Admitting: Internal Medicine

## 2011-04-01 NOTE — Telephone Encounter (Signed)
I am not his PCP and have not prescribed this to him. He may need to establish care with PCP or get it from urgent care. Thank you.

## 2011-04-02 MED ORDER — PHENYTOIN SODIUM EXTENDED 100 MG PO CAPS: 300.0000 mg | ORAL_CAPSULE | Freq: Every day | ORAL | Status: DC

## 2011-04-02 NOTE — Telephone Encounter (Signed)
I have never seen this Mr. Peets and we are currently not taking new patients into our practice.  You may want to refer him to a Primary Care Provider for his non-oncologic care.  I have renewed this medication X 1 month to give Mr. Pettway time to establish with a Primary Care provider for such ongoing care.  Thank You.

## 2011-04-02 NOTE — Telephone Encounter (Signed)
Addended by: Doneen Poisson D on: 04/02/2011 03:06 PM   Modules accepted: Orders

## 2011-04-03 NOTE — Telephone Encounter (Signed)
I have called the pt several times at # in [facility] and no answer and no message machine. I called the pharmacy and asked them to have pt call us and relay the message about getting a PCP in next 30 days. Pharmacy  Will give message to pt when he picks up dilantin.

## 2011-04-18 ENCOUNTER — Other Ambulatory Visit (HOSPITAL_BASED_OUTPATIENT_CLINIC_OR_DEPARTMENT_OTHER): Payer: Medicare Other | Admitting: Lab

## 2011-04-18 DIAGNOSIS — C3490 Malignant neoplasm of unspecified part of unspecified bronchus or lung: Secondary | ICD-10-CM

## 2011-04-18 DIAGNOSIS — I82409 Acute embolism and thrombosis of unspecified deep veins of unspecified lower extremity: Secondary | ICD-10-CM

## 2011-04-18 LAB — PROTIME-INR: Protime: 42 Seconds — ABNORMAL HIGH (ref 10.6–13.4)

## 2011-05-06 ENCOUNTER — Other Ambulatory Visit: Payer: Self-pay | Admitting: Internal Medicine

## 2011-05-06 NOTE — Telephone Encounter (Signed)
Not IM clinic pt

## 2011-05-07 ENCOUNTER — Other Ambulatory Visit: Payer: Self-pay | Admitting: *Deleted

## 2011-05-07 MED ORDER — PHENYTOIN SODIUM EXTENDED 100 MG PO CAPS: 300.0000 mg | ORAL_CAPSULE | Freq: Every day | ORAL | Status: DC

## 2011-05-07 NOTE — Telephone Encounter (Signed)
Pt has a scheduled appointment this Friday to establish care with [place].  He is out of dilantin as of today.  Please refill for 1 month.

## 2011-05-10 ENCOUNTER — Ambulatory Visit (INDEPENDENT_AMBULATORY_CARE_PROVIDER_SITE_OTHER): Payer: Medicare Other | Admitting: Internal Medicine

## 2011-05-10 ENCOUNTER — Encounter: Payer: Self-pay | Admitting: Internal Medicine

## 2011-05-10 VITALS — BP 118/72 | HR 91 | Temp 97.7°F | Ht 70.0 in | Wt 161.5 lb

## 2011-05-10 DIAGNOSIS — R569 Unspecified convulsions: Secondary | ICD-10-CM

## 2011-05-10 MED ORDER — PHENYTOIN SODIUM EXTENDED 100 MG PO CAPS: 300.0000 mg | ORAL_CAPSULE | Freq: Every day | ORAL | Status: DC

## 2011-05-10 NOTE — Assessment & Plan Note (Signed)
Patient has been on Dilantin for past 20 years at the same dose without any evidence of seizures. There is no change in his medications, no new antibiotics suggestive of interaction or Dilantin Will prescribe Dilantin with refills for 2 years No need to check Dilantin level because he has been on the same dose for 20 years without any seizures Return to the clinic as needed Patient is also being followed by Dr. Arbutus Ped Dilantin refills can be given over the phone in the future

## 2011-05-10 NOTE — Progress Notes (Signed)
Patient ID: Paul [name], male   DOB: 1930/12/31, 78 y.o.   MRN: [id number]  78 Y/o with man pmh listed below comes for checkup No complaints today Complaint with medications No medications side effect Requests refills on his Dilantin Followed by Dr. Arbutus Ped for his cancer and other medical problems except seizure disorder See individual a/p for further details.   Physical exam  General Appearance:     Filed Vitals:   05/10/11 0934  BP: 118/72  Pulse: 91  Temp: 97.7 F (36.5 C)  TempSrc: Oral  Height: 5\' 10"  (1.778 m)  Weight: 161 lb 8 oz (73.256 kg)  SpO2: 96%     Alert, cooperative, no distress, appears stated age  Lungs:     Clear to auscultation bilaterally, respirations unlabored  Chest wall:    No tenderness or deformity  Heart:    Regular rate and rhythm, S1 and S2 normal, no murmur, rub   or gallop  Abdomen:     Soft, non-tender, bowel sounds active all four quadrants,    no masses, no organomegaly  Extremities:   Extremities normal, atraumatic, no cyanosis or edema  Pulses:   2+ and symmetric all extremities  Skin:   Skin color, texture, turgor normal, no rashes or lesions  Neurologic:  nonfocal grossly    ROS  Constitutional: Denies fever, chills, diaphoresis, appetite change and fatigue.  Respiratory: Denies SOB, DOE, cough, chest tightness,  and wheezing.   Cardiovascular: Denies chest pain, palpitations and leg swelling.  Gastrointestinal: Denies nausea, vomiting, abdominal pain, diarrhea, constipation, blood in stool and abdominal distention.  Skin: Denies pallor, rash and wound.  Neurological: Denies dizziness, light-headedness, numbness and headaches.

## 2011-05-16 ENCOUNTER — Other Ambulatory Visit: Payer: Medicare Other | Admitting: Lab

## 2011-06-13 ENCOUNTER — Other Ambulatory Visit: Payer: Medicare Other | Admitting: Lab

## 2011-07-06 ENCOUNTER — Other Ambulatory Visit: Payer: Self-pay | Admitting: Internal Medicine

## 2011-07-13 ENCOUNTER — Emergency Department (HOSPITAL_COMMUNITY): Payer: Medicare Other

## 2011-07-13 ENCOUNTER — Emergency Department (HOSPITAL_COMMUNITY)
Admission: EM | Admit: 2011-07-13 | Discharge: 2011-07-13 | Disposition: A | Discharge status: Home / Self Care | Payer: Medicare Other | Attending: Emergency Medicine | Admitting: Emergency Medicine

## 2011-07-13 ENCOUNTER — Encounter (HOSPITAL_COMMUNITY): Payer: Self-pay | Admitting: Emergency Medicine

## 2011-07-13 DIAGNOSIS — Z85118 Personal history of other malignant neoplasm of bronchus and lung: Secondary | ICD-10-CM | POA: Insufficient documentation

## 2011-07-13 DIAGNOSIS — S02401A Maxillary fracture, unspecified, initial encounter for closed fracture: Secondary | ICD-10-CM | POA: Insufficient documentation

## 2011-07-13 DIAGNOSIS — J449 Chronic obstructive pulmonary disease, unspecified: Secondary | ICD-10-CM | POA: Insufficient documentation

## 2011-07-13 DIAGNOSIS — Z7901 Long term (current) use of anticoagulants: Secondary | ICD-10-CM | POA: Insufficient documentation

## 2011-07-13 DIAGNOSIS — S0181XA Laceration without foreign body of other part of head, initial encounter: Secondary | ICD-10-CM

## 2011-07-13 DIAGNOSIS — K219 Gastro-esophageal reflux disease without esophagitis: Secondary | ICD-10-CM | POA: Insufficient documentation

## 2011-07-13 DIAGNOSIS — Y92009 Unspecified place in unspecified non-institutional (private) residence as the place of occurrence of the external cause: Secondary | ICD-10-CM | POA: Insufficient documentation

## 2011-07-13 DIAGNOSIS — S0180XA Unspecified open wound of other part of head, initial encounter: Secondary | ICD-10-CM | POA: Insufficient documentation

## 2011-07-13 DIAGNOSIS — S0292XA Unspecified fracture of facial bones, initial encounter for closed fracture: Secondary | ICD-10-CM

## 2011-07-13 DIAGNOSIS — Z79899 Other long term (current) drug therapy: Secondary | ICD-10-CM | POA: Insufficient documentation

## 2011-07-13 DIAGNOSIS — I10 Essential (primary) hypertension: Secondary | ICD-10-CM | POA: Insufficient documentation

## 2011-07-13 DIAGNOSIS — F17200 Nicotine dependence, unspecified, uncomplicated: Secondary | ICD-10-CM | POA: Insufficient documentation

## 2011-07-13 DIAGNOSIS — J4489 Other specified chronic obstructive pulmonary disease: Secondary | ICD-10-CM | POA: Insufficient documentation

## 2011-07-13 DIAGNOSIS — S02400A Malar fracture unspecified, initial encounter for closed fracture: Secondary | ICD-10-CM | POA: Insufficient documentation

## 2011-07-13 DIAGNOSIS — S0280XA Fracture of other specified skull and facial bones, unspecified side, initial encounter for closed fracture: Secondary | ICD-10-CM | POA: Insufficient documentation

## 2011-07-13 HISTORY — DX: Malignant neoplasm of unspecified part of unspecified bronchus or lung: C34.90

## 2011-07-13 LAB — PROTIME-INR: INR: 1.68 — ABNORMAL HIGH (ref 0.00–1.49)

## 2011-07-13 MED ORDER — AMOXICILLIN-POT CLAVULANATE 875-125 MG PO TABS: 1.0000 | ORAL_TABLET | Freq: Two times a day (BID) | ORAL | Status: AC

## 2011-07-13 MED ORDER — AMOXICILLIN-POT CLAVULANATE 875-125 MG PO TABS
1.0000 | ORAL_TABLET | Freq: Once | ORAL | Status: AC
  Administered 2011-07-13: 1 via ORAL
  Filled 2011-07-13: qty 1

## 2011-07-13 MED ORDER — TETANUS-DIPHTHERIA TOXOIDS TD 5-2 LFU IM INJ
0.5000 mL | INJECTION | Freq: Once | INTRAMUSCULAR | Status: AC
  Administered 2011-07-13: 0.5 mL via INTRAMUSCULAR
  Filled 2011-07-13: qty 0.5

## 2011-07-13 MED ORDER — HYDROCODONE-ACETAMINOPHEN 5-325 MG PO TABS
1.0000 | ORAL_TABLET | Freq: Once | ORAL | Status: AC
  Administered 2011-07-13: 1 via ORAL
  Filled 2011-07-13: qty 1

## 2011-07-13 MED ORDER — HYDROCODONE-ACETAMINOPHEN 5-325 MG PO TABS: 1.0000 | ORAL_TABLET | ORAL | Status: AC | PRN

## 2011-07-13 NOTE — ED Provider Notes (Signed)
Medical screening examination/treatment/procedure(s) were performed by non-physician practitioner and as supervising physician I was immediately available for consultation/collaboration.  Ethelda Chick, MD 07/13/11 [phone]

## 2011-07-13 NOTE — Discharge Instructions (Signed)
Facial Laceration  A facial laceration is a cut on the face. Lacerations usually heal quickly, but they need special care to reduce scarring. It will take 1 to 2 years for the scar to lose its redness and to heal completely.  TREATMENT   Some facial lacerations may not require closure. Some lacerations may not be able to be closed due to an increased risk of infection. It is important to see your caregiver as soon as possible after an injury to minimize the risk of infection and to maximize the opportunity for successful closure.  If closure is appropriate, pain medicines may be given, if needed. The wound will be cleaned to help prevent infection. Your caregiver will use stitches (sutures), staples, wound glue (adhesive), or skin adhesive strips to repair the laceration. These tools bring the skin edges together to allow for faster healing and a better cosmetic outcome. However, all wounds will heal with a scar.   Once the wound has healed, scarring can be minimized by covering the wound with sunscreen during the day for 1 full year. Use a sunscreen with an SPF of at least 30. Sunscreen helps to reduce the pigment that will form in the scar. When applying sunscreen to a completely healed wound, massage the scar for a few minutes to help reduce the appearance of the scar. Use circular motions with your fingertips, on and around the scar. Do not massage a healing wound.  HOME CARE INSTRUCTIONS  For sutures:   Keep the wound clean and dry.   If you were given a bandage (dressing), you should change it at least once a day. Also change the dressing if it becomes wet or dirty, or as directed by your caregiver.   Wash the wound with soap and water 2 times a day. Rinse the wound off with water to remove all soap. Pat the wound dry with a clean towel.   After cleaning, apply a thin layer of the antibiotic ointment recommended by your caregiver. This will help prevent infection and keep the dressing from sticking.   You  may shower as usual after the first 24 hours. Do not soak the wound in water until the sutures are removed.   Only take over-the-counter or prescription medicines for pain, discomfort, or fever as directed by your caregiver.   Get your sutures removed as directed by your caregiver. With facial lacerations, sutures should usually be taken out after 4 to 5 days to avoid stitch marks.   Wait a few days after your sutures are removed before applying makeup.  For skin adhesive strips:   Keep the wound clean and dry.   Do not get the skin adhesive strips wet. You may bathe carefully, using caution to keep the wound dry.   If the wound gets wet, pat it dry with a clean towel.   Skin adhesive strips will fall off on their own. You may trim the strips as the wound heals. Do not remove skin adhesive strips that are still stuck to the wound. They will fall off in time.  For wound adhesive:   You may briefly wet your wound in the shower or bath. Do not soak or scrub the wound. Do not swim. Avoid periods of heavy perspiration until the skin adhesive has fallen off on its own. After showering or bathing, gently pat the wound dry with a clean towel.   Do not apply liquid medicine, cream medicine, ointment medicine, or makeup to your wound while the   before your wound is healed.   If a dressing is placed over the wound, be careful not to apply tape directly over the skin adhesive. This may cause the adhesive to be pulled off before the wound is healed.   Avoid prolonged exposure to sunlight or tanning lamps while the skin adhesive is in place. Exposure to ultraviolet light in the first year will darken the scar.   The skin adhesive will usually remain in place for 5 to 10 days, then naturally fall off the skin. Do not pick at the adhesive film.  You may need a tetanus shot if:  You cannot remember when you had your last tetanus shot.   You have  never had a tetanus shot.  If you get a tetanus shot, your arm may swell, get red, and feel warm to the touch. This is common and not a problem. If you need a tetanus shot and you choose not to have one, there is a rare chance of getting tetanus. Sickness from tetanus can be serious. SEEK IMMEDIATE MEDICAL CARE IF:  You develop redness, pain, or swelling around the wound.   There is yellowish-white fluid (pus) coming from the wound.   You develop chills or a fever.  MAKE SURE YOU:  Understand these instructions.   Will watch your condition.   Will get help right away if you are not doing well or get worse.  Document Released: 02/15/2004 Document Revised: 12/27/2010 Document Reviewed: 07/02/2010 [facility] Patient Information 2012 [place], [place].Facial Fracture A facial fracture is a break in one of the bones of your face. HOME CARE INSTRUCTIONS   Protect the injured part of your face until it is healed.   Do not participate in activities which give chance for re-injury until your doctor approves.   Gently wash and dry your face.   Wear head and facial protection while riding a bicycle, motorcycle, or snowmobile.  SEEK MEDICAL CARE IF:   An oral temperature above 102 F (38.9 C) develops.   You have severe headaches or notice changes in your vision.   You have new numbness or tingling in your face.   You develop nausea (feeling sick to your stomach), vomiting or a stiff neck.  SEEK IMMEDIATE MEDICAL CARE IF:   You develop difficulty seeing or experience double vision.   You become dizzy, lightheaded, or faint.   You develop trouble speaking, breathing, or swallowing.   You have a watery discharge from your nose or ear.  MAKE SURE YOU:   Understand these instructions.   Will watch your condition.   Will get help right away if you are not doing well or get worse.  Document Released: 01/07/2005 Document Revised: 12/27/2010 Document Reviewed: 08/27/2007 [facility]  Patient Information 2012 [place], [place].Stitches, Staples, or Skin Adhesive Strips  Stitches (sutures), staples, and skin adhesive strips hold the skin together as it heals. They will usually be in place for 7 days or less. HOME CARE  Wash your hands with soap and water before and after you touch your wound.   Only take medicine as told by your doctor.   Cover your wound only if your doctor told you to. Otherwise, leave it open to air.   Do not get your stitches wet or dirty. If they get dirty, dab them gently with a clean washcloth. Wet the washcloth with soapy water. Do not rub. Pat them dry gently.   Do not put medicine or medicated cream on your stitches unless your doctor  told you to.   Do not take out your own stitches or staples. Skin adhesive strips will fall off by themselves.   Do not pick at the wound. Picking can cause an infection.   Do not miss your follow-up appointment.   If you have problems or questions, call your doctor.  GET HELP RIGHT AWAY IF:   You have a temperature by mouth above 102 F (38.9 C), not controlled by medicine.   You have chills.   You have redness or pain around your stitches.   There is puffiness (swelling) around your stitches.   You notice fluid (drainage) from your stitches.   There is a bad smell coming from your wound.  MAKE SURE YOU:  Understand these instructions.   Will watch your condition.   Will get help if you are not doing well or get worse.  Document Released: 11/04/2008 Document Revised: 12/27/2010 Document Reviewed: 11/04/2008 [facility] Patient Information 2012 [place], [place].Zygoma Fracture A fracture (break) in your cheekbone is also called a zygoma fracture. If this bone is in normal position, conservative treatment may be all that is necessary. This means an operation is not required. If this bone is displaced, surgery may be necessary to repair the fracture and get it back into position. This fracture is easily  diagnosed with x-rays. LET YOUR CAREGIVER KNOW ABOUT:  Allergies.   Medications taken including herbs, eye drops, over the counter medications, and creams.   Use of steroids by mouth or creams.   Other health problems.   Possibility of pregnancy, if this applies.   History of blood clots (thrombophlebitis).   History of bleeding or blood problems.   Previous surgery.   Previous problems with anesthetics or novocaine.  HOME CARE INSTRUCTIONS   You may resume normal diet and activities as directed or allowed.   Take prescribed medication as directed. Only take over-the-counter or prescription medicines for pain, discomfort, or fever as directed by your caregiver. Do not give aspirin to children less than 12 years of age unless advised by your caregiver because of the association with Reye's Syndrome.   Apply ice to the areas of pain and swelling for 15 to 20 minutes every hour while awake, for 2 days. Put the ice in a plastic bag and place a thin towel between the bag of ice and your cast, splint, or wrap.  SEEK MEDICAL CARE IF:   Increased pain, swelling, or bruising of the cheek that is not relieved with medication.   Increasing warmth or redness (inflammation) in the area of injury.   Problems with increasing swelling or bruising of the injured area.   You develop any visual problems.   You develop any leak or discharge of watery material from your nose.  Document Released: 10/02/2000 Document Revised: 12/27/2010 Document Reviewed: 05/12/2007 [facility] Patient Information 2012 [place], [place].

## 2011-07-13 NOTE — ED Notes (Signed)
PT. ARRIVED WITH EMS FROM DAUGHTER'S HOUSE , HIT WITH A GLASS VASE AT LEFT FACE , NO LOC , PRESENTS WITH SWELLING AT LEFT TEMPORAL AREA WITH PUNCTURE WOUND WITH MODERATE BLEEDING . DRESSING APPLIED BY EMS PTA.

## 2011-07-13 NOTE — ED Notes (Signed)
NURSE EXPLAINED DELAY AND PROCESS , PT. Paul [name] FOR EDP.
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
## 2011-07-18 ENCOUNTER — Other Ambulatory Visit: Payer: Medicare Other | Admitting: Lab

## 2011-07-18 DIAGNOSIS — C3490 Malignant neoplasm of unspecified part of unspecified bronchus or lung: Secondary | ICD-10-CM

## 2011-07-18 DIAGNOSIS — I82409 Acute embolism and thrombosis of unspecified deep veins of unspecified lower extremity: Secondary | ICD-10-CM

## 2011-07-18 LAB — PROTIME-INR: INR: 2.6 (ref 2.00–3.50)

## 2011-07-19 ENCOUNTER — Telehealth: Payer: Self-pay | Admitting: Medical Oncology

## 2011-07-19 NOTE — Telephone Encounter (Signed)
Unable to leave message on pt home phone .

## 2011-08-19 ENCOUNTER — Other Ambulatory Visit (HOSPITAL_BASED_OUTPATIENT_CLINIC_OR_DEPARTMENT_OTHER): Payer: Medicare Other | Admitting: Lab

## 2011-08-19 ENCOUNTER — Other Ambulatory Visit (HOSPITAL_COMMUNITY): Payer: Medicare Other

## 2011-08-21 ENCOUNTER — Ambulatory Visit: Payer: Medicare Other | Admitting: Internal Medicine

## 2011-08-22 ENCOUNTER — Other Ambulatory Visit: Payer: Self-pay | Admitting: *Deleted

## 2011-08-23 ENCOUNTER — Telehealth: Payer: Self-pay | Admitting: Internal Medicine

## 2011-08-23 NOTE — Telephone Encounter (Signed)
na at home number, mailed appts to pt   aom

## 2011-09-16 ENCOUNTER — Other Ambulatory Visit: Payer: Medicare Other | Admitting: Lab

## 2011-09-16 ENCOUNTER — Other Ambulatory Visit (HOSPITAL_COMMUNITY): Payer: Medicare Other

## 2011-09-16 ENCOUNTER — Telehealth: Payer: Self-pay | Admitting: Internal Medicine

## 2011-09-16 NOTE — Telephone Encounter (Signed)
pt called and r.s his 8/26 lab and scan to 8/27     aom

## 2011-09-17 ENCOUNTER — Ambulatory Visit (HOSPITAL_COMMUNITY)
Admission: RE | Admit: 2011-09-17 | Discharge: 2011-09-17 | Disposition: A | Discharge status: Home / Self Care | Payer: Medicare Other | Source: Ambulatory Visit | Attending: Internal Medicine | Admitting: Internal Medicine

## 2011-09-17 ENCOUNTER — Other Ambulatory Visit: Payer: Medicare Other | Admitting: Lab

## 2011-09-17 DIAGNOSIS — C787 Secondary malignant neoplasm of liver and intrahepatic bile duct: Secondary | ICD-10-CM

## 2011-09-17 DIAGNOSIS — I82409 Acute embolism and thrombosis of unspecified deep veins of unspecified lower extremity: Secondary | ICD-10-CM

## 2011-09-17 DIAGNOSIS — C801 Malignant (primary) neoplasm, unspecified: Secondary | ICD-10-CM

## 2011-09-17 DIAGNOSIS — K449 Diaphragmatic hernia without obstruction or gangrene: Secondary | ICD-10-CM | POA: Insufficient documentation

## 2011-09-17 DIAGNOSIS — C3490 Malignant neoplasm of unspecified part of unspecified bronchus or lung: Secondary | ICD-10-CM | POA: Insufficient documentation

## 2011-09-17 DIAGNOSIS — C3410 Malignant neoplasm of upper lobe, unspecified bronchus or lung: Secondary | ICD-10-CM

## 2011-09-17 LAB — CBC WITH DIFFERENTIAL/PLATELET
Basophils Absolute: 0 10*3/uL (ref 0.0–0.1)
Eosinophils Absolute: 0.1 10*3/uL (ref 0.0–0.5)
HCT: 48.2 % (ref 38.4–49.9)
LYMPH%: 13.9 % — ABNORMAL LOW (ref 14.0–49.0)
MCV: 88.2 fL (ref 79.3–98.0)
MONO#: 0.7 10*3/uL (ref 0.1–0.9)
MONO%: 12.2 % (ref 0.0–14.0)
NEUT#: 4.3 10*3/uL (ref 1.5–6.5)
NEUT%: 71.8 % (ref 39.0–75.0)
Platelets: 204 10*3/uL (ref 140–400)
RBC: 5.47 10*6/uL (ref 4.20–5.82)

## 2011-09-17 LAB — COMPREHENSIVE METABOLIC PANEL (CC13)
ALT: 11 U/L (ref 0–55)
Alkaline Phosphatase: 130 U/L (ref 40–150)
Creatinine: 0.8 mg/dL (ref 0.7–1.3)
Glucose: 119 mg/dl — ABNORMAL HIGH (ref 70–99)
Sodium: 135 mEq/L — ABNORMAL LOW (ref 136–145)
Total Bilirubin: 0.6 mg/dL (ref 0.20–1.20)

## 2011-09-17 LAB — PROTIME-INR: Protime: 24 Seconds — ABNORMAL HIGH (ref 10.6–13.4)

## 2011-09-17 MED ORDER — IOHEXOL 300 MG/ML  SOLN
80.0000 mL | Freq: Once | INTRAMUSCULAR | Status: AC | PRN
  Administered 2011-09-17: 80 mL via INTRAVENOUS

## 2011-09-18 ENCOUNTER — Ambulatory Visit: Payer: Medicare Other | Admitting: Internal Medicine

## 2011-09-18 ENCOUNTER — Telehealth: Payer: Self-pay | Admitting: Medical Oncology

## 2011-09-18 ENCOUNTER — Telehealth: Payer: Self-pay | Admitting: Internal Medicine

## 2011-09-18 NOTE — Telephone Encounter (Signed)
r/s f/u from 8/28 to 9/5 - d/t per 8/28 pof - per pof and Diane pt aware.

## 2011-09-18 NOTE — Telephone Encounter (Signed)
Wants scan results over the p hone. HE said he missed appt today because he was working out of town and then he said he did not have money for transportation. I told him Dr Donnald Garre needs to see him to go over scans. I r/s pt

## 2011-09-26 ENCOUNTER — Telehealth: Payer: Self-pay | Admitting: Internal Medicine

## 2011-09-26 ENCOUNTER — Ambulatory Visit (HOSPITAL_BASED_OUTPATIENT_CLINIC_OR_DEPARTMENT_OTHER): Payer: Medicare Other | Admitting: Internal Medicine

## 2011-09-26 VITALS — BP 113/71 | HR 99 | Temp 96.9°F | Resp 18 | Ht 70.0 in | Wt 156.6 lb

## 2011-09-26 DIAGNOSIS — Z86711 Personal history of pulmonary embolism: Secondary | ICD-10-CM

## 2011-09-26 DIAGNOSIS — J40 Bronchitis, not specified as acute or chronic: Secondary | ICD-10-CM

## 2011-09-26 DIAGNOSIS — Z86718 Personal history of other venous thrombosis and embolism: Secondary | ICD-10-CM

## 2011-09-26 DIAGNOSIS — C3410 Malignant neoplasm of upper lobe, unspecified bronchus or lung: Secondary | ICD-10-CM

## 2011-09-26 DIAGNOSIS — C3490 Malignant neoplasm of unspecified part of unspecified bronchus or lung: Secondary | ICD-10-CM

## 2011-09-26 MED ORDER — AZITHROMYCIN 250 MG PO TABS: ORAL_TABLET | ORAL | Status: AC

## 2011-09-26 NOTE — Progress Notes (Signed)
[facility] [phone]   [phone]  OFFICE PROGRESS NOTE  Lajuana Matte., MD [address] [place] [address]  PRINCIPAL DIAGNOSES:  1. Stage IIIA non-small cell lung cancer diagnosed in November 2005 with disease recurrence in February 2007. 2. History of pulmonary emboli diagnosed incidentally on CT scan of the chest in May 2006. 3. Right lower extremity deep venous thrombosis diagnosed in August 2007 and the patient was off anticoagulation at that time.  PRIOR THERAPY:  1. Status post concurrent chemoradiation with weekly carboplatin and paclitaxel. Last dose was given February 07, 2004. 2. Status post 3 cycles of consolidation chemotherapy with docetaxel. Last dose was given April 04, 2004. 3. Status post 12 cycles of single-agent Alimta for disease recurrence. Last dose was given December 26, 2005 and the patient has disease stabilization since that time.  CURRENT THERAPY.:  1. Observation for lung cancer. 2. Coumadin 2.5 mg p.o. daily except Monday and Thursday the patient on 5 mg for history of deep venous thrombosis and pulmonary emboli.  INTERVAL HISTORY: Paul [name] 78 y.o. male returns to the clinic today for routine six-month followup visit. The patient is feeling fine with no specific complaints except for mild bronchitis recently. He denied having any significant chest pain or shortness breath but continues to have cough productive of whitish sputum. The patient denied having any fever or chills. He has no significant weight loss or night sweats. He has repeat CT scan of the chest performed recently and he is here for evaluation and discussion of his scan results.  MEDICAL HISTORY: Past Medical History  Diagnosis Date  . Hypertension   . COPD (chronic obstructive pulmonary disease)   . Hemorrhoids     external  . 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 GERD (gastroesophageal reflux disease)   .  Emphysema   . Non-small cell carcinoma of lung dx'd 2006    stage 3  . Lung cancer     ALLERGIES:   has no known allergies.  MEDICATIONS:  Current Outpatient Prescriptions  Medication Sig Dispense Refill  . phenytoin (DILANTIN) 100 MG ER capsule Take 3 capsules (300 mg total) by mouth daily.  90 capsule  10  . warfarin (COUMADIN) 5 MG tablet TAKE 1 TABLET BY MOUTH EVERY DAY  30 tablet  3    SURGICAL HISTORY:  Past Surgical History  Procedure Date  . Inner ear surgery     REVIEW OF SYSTEMS:  A comprehensive review of systems was negative except for: Respiratory: positive for cough and sputum   PHYSICAL EXAMINATION: General appearance: alert, cooperative and no distress Head: Normocephalic, without obvious abnormality, atraumatic Neck: no adenopathy Lymph nodes: Cervical, supraclavicular, and axillary nodes normal. Resp: Wheezes to auscultation bilaterally Cardio: regular rate and rhythm, S1, S2 normal, no murmur, click, rub or gallop GI: soft, non-tender; bowel sounds normal; no masses,  no organomegaly Extremities: extremities normal, atraumatic, no cyanosis or edema  ECOG PERFORMANCE STATUS: 1 - Symptomatic but completely ambulatory  Blood pressure 113/71, pulse 99, temperature 96.9 F (36.1 C), temperature source Oral, resp. rate 18, height 5\' 10"  (1.778 m), weight 156 lb 9.6 oz (71.033 kg).  LABORATORY DATA: Lab Results  Component Value Date   WBC 6.0 09/17/2011   HGB 16.5 09/17/2011   HCT 48.2 09/17/2011   MCV 88.2 09/17/2011   PLT 204 09/17/2011      Chemistry  Component Value Date/Time   NA 135* 09/17/2011 1047   NA 142 02/15/2011 0956   NA 138 10/30/2009 0901   K 5.1 09/17/2011 1047   K 4.4 02/15/2011 0956   K 4.4 10/30/2009 0901   CL 100 09/17/2011 1047   CL 99 02/15/2011 0956   CL 101 10/30/2009 0901   CO2 28 09/17/2011 1047   CO2 29 02/15/2011 0956   CO2 30 10/30/2009 0901   BUN 7.0 09/17/2011 1047   BUN 8 02/15/2011 0956   BUN 5* 10/30/2009 0901    CREATININE 0.8 09/17/2011 1047   CREATININE 0.5* 02/15/2011 0956   CREATININE 0.69 10/30/2009 0901      Component Value Date/Time   CALCIUM 10.0 09/17/2011 1047   CALCIUM 8.8 02/15/2011 0956   CALCIUM 9.4 10/30/2009 0901   ALKPHOS 130 09/17/2011 1047   ALKPHOS 109* 02/15/2011 0956   ALKPHOS 123* 10/30/2009 0901   AST 11 09/17/2011 1047   AST 18 02/15/2011 0956   AST 18 10/30/2009 0901   ALT 11 09/17/2011 1047   ALT 16 10/30/2009 0901   BILITOT 0.60 09/17/2011 1047   BILITOT 0.50 02/15/2011 0956   BILITOT 0.7 10/30/2009 0901       RADIOGRAPHIC STUDIES: Ct Chest W Contrast  09/17/2011  *RADIOLOGY REPORT*  Clinical Data: Restaging lung cancer, chemotherapy complete  CT CHEST WITH CONTRAST  Technique:  Multidetector CT imaging of the chest was performed following the standard protocol during bolus administration of intravenous contrast.  Contrast: 80mL OMNIPAQUE IOHEXOL 300 MG/ML  SOLN  Comparison: CT 02/15/2011  Findings: There is again demonstrated consolidation and central air bronchograms in the right upper lobe not significantly  changed from prior.  No new nodularity or mass in the right upper lobe. There is extensive central lobular emphysema within the left and right lung.  6 mm left upper lobe nodule (image 20) is unchanged.  No axillary or supraclavicular lymphadenopathy.  No mediastinal or hilar lymphadenopathy.  Moderate hiatal hernia is noted.  Esophagus is normal.  Limited view of the upper abdomen shows normal adrenal glands.  No focal hepatic lesion.  Limited view of the skeleton shows no aggressive osseous lesions.  IMPRESSION:  1.  Stable post therapy change in the right upper lobe without evidence of local recurrence. 2.  Moderate hiatal hernia.   Original Report Authenticated By: [name], M.D.     ASSESSMENT: This is a very pleasant 78 years old white male with recurrent non-small cell lung cancer initially diagnosed as stage IIIa in  November of 2005. The patient has been  observation for the last few years with no evidence for disease progression.  PLAN: I discussed the scan results with the patient. I recommended for him to continue on observation. I would see him back for followup visit in 6 months with repeat CT scan of the chest.  For the bronchitis I started the patient on Z-Pak. He was advised to call immediately she has any concerning symptoms in the interval.  For the history of pulmonary emboli the patient will continue on Coumadin and followed by the Coumadin clinic.  All questions were answered. The patient knows to call the clinic with any problems, questions or concerns. We can certainly see the patient much sooner if necessary.

## 2011-09-26 NOTE — Patient Instructions (Signed)
Your CT scan of the chest showed no evidence for disease progression. Followup visit in 6 months with repeat scan. I will call your pharmacy with prescription for Z-Pak for the bronchitis

## 2011-09-26 NOTE — Telephone Encounter (Signed)
appts made and printed for pt aom °

## 2011-10-28 ENCOUNTER — Other Ambulatory Visit: Payer: Self-pay | Admitting: Dermatology

## 2011-11-13 ENCOUNTER — Emergency Department (HOSPITAL_COMMUNITY)
Admission: EM | Admit: 2011-11-13 | Discharge: 2011-11-13 | Disposition: A | Discharge status: Home / Self Care | Payer: Medicare Other | Attending: Emergency Medicine | Admitting: Emergency Medicine

## 2011-11-13 ENCOUNTER — Emergency Department (HOSPITAL_COMMUNITY): Payer: Medicare Other

## 2011-11-13 ENCOUNTER — Encounter (HOSPITAL_COMMUNITY): Payer: Self-pay

## 2011-11-13 DIAGNOSIS — Z85118 Personal history of other malignant neoplasm of bronchus and lung: Secondary | ICD-10-CM | POA: Insufficient documentation

## 2011-11-13 DIAGNOSIS — Z8719 Personal history of other diseases of the digestive system: Secondary | ICD-10-CM | POA: Insufficient documentation

## 2011-11-13 DIAGNOSIS — Y929 Unspecified place or not applicable: Secondary | ICD-10-CM | POA: Insufficient documentation

## 2011-11-13 DIAGNOSIS — J449 Chronic obstructive pulmonary disease, unspecified: Secondary | ICD-10-CM | POA: Insufficient documentation

## 2011-11-13 DIAGNOSIS — Z7901 Long term (current) use of anticoagulants: Secondary | ICD-10-CM | POA: Insufficient documentation

## 2011-11-13 DIAGNOSIS — S8010XA Contusion of unspecified lower leg, initial encounter: Secondary | ICD-10-CM | POA: Insufficient documentation

## 2011-11-13 DIAGNOSIS — L02419 Cutaneous abscess of limb, unspecified: Secondary | ICD-10-CM | POA: Insufficient documentation

## 2011-11-13 DIAGNOSIS — J4489 Other specified chronic obstructive pulmonary disease: Secondary | ICD-10-CM | POA: Insufficient documentation

## 2011-11-13 DIAGNOSIS — Z8679 Personal history of other diseases of the circulatory system: Secondary | ICD-10-CM | POA: Insufficient documentation

## 2011-11-13 DIAGNOSIS — S8012XA Contusion of left lower leg, initial encounter: Secondary | ICD-10-CM

## 2011-11-13 DIAGNOSIS — L0390 Cellulitis, unspecified: Secondary | ICD-10-CM

## 2011-11-13 DIAGNOSIS — Z8709 Personal history of other diseases of the respiratory system: Secondary | ICD-10-CM | POA: Insufficient documentation

## 2011-11-13 DIAGNOSIS — Y939 Activity, unspecified: Secondary | ICD-10-CM | POA: Insufficient documentation

## 2011-11-13 DIAGNOSIS — I10 Essential (primary) hypertension: Secondary | ICD-10-CM | POA: Insufficient documentation

## 2011-11-13 DIAGNOSIS — IMO0002 Reserved for concepts with insufficient information to code with codable children: Secondary | ICD-10-CM | POA: Insufficient documentation

## 2011-11-13 HISTORY — DX: Unspecified hearing loss, unspecified ear: H91.90

## 2011-11-13 LAB — CBC
Hemoglobin: 15.8 g/dL (ref 13.0–17.0)
MCH: 30.2 pg (ref 26.0–34.0)
MCV: 86.6 fL (ref 78.0–100.0)
RBC: 5.24 MIL/uL (ref 4.22–5.81)
WBC: 4.8 10*3/uL (ref 4.0–10.5)

## 2011-11-13 MED ORDER — OXYCODONE-ACETAMINOPHEN 2.5-325 MG PO TABS: 1.0000 | ORAL_TABLET | Freq: Four times a day (QID) | ORAL | Status: DC | PRN

## 2011-11-13 MED ORDER — SULFAMETHOXAZOLE-TRIMETHOPRIM 800-160 MG PO TABS: 1.0000 | ORAL_TABLET | Freq: Two times a day (BID) | ORAL | Status: DC

## 2011-11-13 NOTE — ED Provider Notes (Signed)
History     CSN: [id number]  Arrival date & time 11/13/11  0904   First MD Initiated Contact with Patient 11/13/11 [phone]      Chief Complaint  Patient presents with  . Leg Pain    (Consider location/radiation/quality/duration/timing/severity/associated sxs/prior treatment) HPI Comments: [name] presents ambulatory for evaluation of left leg pain.  He was struck in the leg by his swinging car door about a week ago.  There was a wound created that remained open for several days.  He was treated with localized wound care by a local physician, Dr Mayford Knife.  Since using the ointment on the wound over the last 48 hours the wound has now "dried-up, but he has a persistent throbbing pain.  He denies any foot pain, calf pain, swelling, fever, or any other symptoms.  Patient is a 78 y.o. male presenting with leg pain. The history is provided by the patient. No language interpreter was used.  Leg Pain  The incident occurred more than 1 week ago. The incident occurred at home. The injury mechanism was a direct blow. The pain is present in the left leg. The pain is at a severity of 8/10. The pain is severe. The pain has been fluctuating since onset. Pertinent negatives include no numbness, no inability to bear weight, no loss of motion, no muscle weakness, no loss of sensation and no tingling. He reports no foreign bodies present.    Past Medical History  Diagnosis Date  . Hypertension   . COPD (chronic obstructive pulmonary disease)   . Hemorrhoids     external  . 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 GERD (gastroesophageal reflux disease)   . Emphysema   . Non-small cell carcinoma of lung dx'd 2006    stage 3  . Lung cancer   . HOH (hard of hearing)     Past Surgical History  Procedure Date  . Inner ear surgery     No family history on file.  History  Substance Use Topics  . Smoking status: Current Every Day Smoker -- 1.5 packs/day  . Smokeless tobacco: Not on  file  . Alcohol Use: No      Review of Systems  Neurological: Negative for tingling and numbness.  All other systems reviewed and are negative.    Allergies  Review of patient's allergies indicates no known allergies.  Home Medications   Current Outpatient Rx  Name Route Sig Dispense Refill  . PHENYTOIN SODIUM EXTENDED 100 MG PO CAPS Oral Take 3 capsules (300 mg total) by mouth daily. 90 capsule 10  . WARFARIN SODIUM 5 MG PO TABS Oral Take 5 mg by mouth daily.      BP 137/64  Pulse 88  Temp 97.6 F (36.4 C) (Oral)  Resp 18  Ht 5\' 10"  (1.778 m)  Wt 160 lb (72.576 kg)  BMI 22.96 kg/m2  SpO2 99%  Physical Exam  Nursing note and vitals reviewed. Constitutional: He appears well-developed and well-nourished. No distress.  HENT:  Head: Normocephalic and atraumatic.  Right Ear: External ear normal.  Left Ear: External ear normal.  Nose: Nose normal.  Mouth/Throat: Oropharynx is clear and moist. No oropharyngeal exudate.  Eyes: Conjunctivae normal are normal. Pupils are equal, round, and reactive to light. Right eye exhibits no discharge. Left eye exhibits no discharge. No scleral icterus.  Neck: Normal range of motion. Neck supple. No JVD present. No tracheal deviation present.  Cardiovascular: Normal rate, regular rhythm,  normal heart sounds and intact distal pulses.  Exam reveals no gallop and no friction rub.   No murmur heard. Pulmonary/Chest: Effort normal. No stridor. No respiratory distress. He has wheezes. He has no rales. He exhibits no tenderness.       + bronchi and slightly diminished breath sounds bilat.  Abdominal: Soft. Bowel sounds are normal. He exhibits no distension and no mass. There is no tenderness. There is no rebound and no guarding.  Musculoskeletal: Normal range of motion. He exhibits tenderness. He exhibits no edema.       Left pretibial  Lymphadenopathy:    He has no cervical adenopathy.  Skin: Skin is warm and dry. Bruising and lesion noted.  No ecchymosis, no laceration, no petechiae and no rash noted. He is not diaphoretic. No cyanosis or erythema. No pallor. Nails show no clubbing.     Psychiatric: He has a normal mood and affect. His behavior is normal.    ED Course  Procedures (including critical care time)   Labs Reviewed  CBC   No results found.   No diagnosis found.    MDM  Pt presents for evaluation of persistent left pretibial pain after having his lower leg slammed by the car door about a week ago.  There is an  Area of mild swelling and tenderness along the distal 1/3 of the left anterior lower leg.  In the center of this is a dry wound (covered with a well formed scab).  No erythema noted.  Pt does have significant tenderness with palpation of this area.  There is no clinical evidence of a compartment syndrome.  Note no deformities.  Plan a CBC to assess for inflammation and xrays  Of the tibia and fibula.  1210.  Pt appears stable, NAD.  No fracture noted on xray of leg.  Repeat skin exam does demonstrate mild erythema of the distal lower leg around the pretibial wound.  Pt has some associated tenderness.   Pt does not have a leukocytosis.  Will treat for cellulitis and pain.  Plan discharge home.      Tobin Chad, MD 11/13/11 1216

## 2011-11-13 NOTE — ED Notes (Signed)
Pt hit LLE on car door one week ago last Saturday- Pt presents with NAD.  Pt has quarter size wound with drainage creamy drainage.  Pt c/o of redness and throbbing to the LLE

## 2011-12-06 ENCOUNTER — Encounter (HOSPITAL_COMMUNITY): Payer: Self-pay

## 2011-12-06 ENCOUNTER — Emergency Department (INDEPENDENT_AMBULATORY_CARE_PROVIDER_SITE_OTHER)
Admission: EM | Admit: 2011-12-06 | Discharge: 2011-12-06 | Disposition: A | Discharge status: Home / Self Care | Payer: Medicare Other | Source: Home / Self Care | Attending: Family Medicine | Admitting: Family Medicine

## 2011-12-06 DIAGNOSIS — IMO0002 Reserved for concepts with insufficient information to code with codable children: Secondary | ICD-10-CM

## 2011-12-06 MED ORDER — TRAMADOL HCL 50 MG PO TABS: 50.0000 mg | ORAL_TABLET | Freq: Four times a day (QID) | ORAL | Status: DC | PRN

## 2011-12-06 NOTE — ED Notes (Signed)
Patient injured left leg approx 3 weeks (car door shut on it) was seen at [facility] on 10/23 , area is red and sore

## 2011-12-06 NOTE — ED Provider Notes (Signed)
History     CSN: [id number]  Arrival date & time 12/06/11  0807   First MD Initiated Contact with Patient 12/06/11 0815      Chief Complaint  Patient presents with  . Leg Injury    (Consider location/radiation/quality/duration/timing/severity/associated sxs/prior treatment) Patient is a 78 y.o. male presenting with wound check. The history is provided by the patient.  Wound Check  He was treated in the ED more than 14 days ago. Previous treatment in the ED includes wound cleansing or irrigation. There has been no treatment since the wound repair. The redness has not changed. The swelling has not changed. The pain has not changed. He has no difficulty moving the affected extremity or digit.    Past Medical History  Diagnosis Date  . Hypertension   . COPD (chronic obstructive pulmonary disease)   . Hemorrhoids     external  . 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 GERD (gastroesophageal reflux disease)   . Emphysema   . Non-small cell carcinoma of lung dx'd 2006    stage 3  . Lung cancer   . HOH (hard of hearing)     Past Surgical History  Procedure Date  . Inner ear surgery     No family history on file.  History  Substance Use Topics  . Smoking status: Current Every Day Smoker -- 1.5 packs/day  . Smokeless tobacco: Not on file  . Alcohol Use: No      Review of Systems  Constitutional: Negative.   Skin: Positive for wound.    Allergies  Review of patient's allergies indicates no known allergies.  Home Medications   Current Outpatient Rx  Name  Route  Sig  Dispense  Refill  . PHENYTOIN SODIUM EXTENDED 100 MG PO CAPS   Oral   Take 3 capsules (300 mg total) by mouth daily.   90 capsule   10   . WARFARIN SODIUM 5 MG PO TABS   Oral   Take 5 mg by mouth daily.         . OXYCODONE-ACETAMINOPHEN 2.5-325 MG PO TABS   Oral   Take 1 tablet by mouth every 6 (six) hours as needed for pain.   15 tablet   0   .  SULFAMETHOXAZOLE-TRIMETHOPRIM 800-160 MG PO TABS   Oral   Take 1 tablet by mouth every 12 (twelve) hours.   20 tablet   0   . TRAMADOL HCL 50 MG PO TABS   Oral   Take 1 tablet (50 mg total) by mouth every 6 (six) hours as needed for pain.   10 tablet   0     BP 137/100  Pulse 88  Temp 97.5 F (36.4 C) (Oral)  Resp 19  SpO2 100%  Physical Exam  Nursing note and vitals reviewed. Constitutional: He is oriented to person, place, and time. He appears well-developed and well-nourished.  Musculoskeletal: He exhibits tenderness.  Neurological: He is alert and oriented to person, place, and time.  Skin: Skin is warm and dry.       Slowly healing skin abrasion to left lower leg, mild local erythema and tenderness, no drainage,    ED Course  Procedures (including critical care time)  Labs Reviewed - No data to display No results found.   1. Leg abrasion, non-infected       MDM          Linna Hoff, MD 12/06/11 [phone]

## 2012-01-02 ENCOUNTER — Telehealth: Payer: Self-pay | Admitting: Medical Oncology

## 2012-01-02 NOTE — Telephone Encounter (Signed)
Pt wants Dr Asa Lente opinion if he should have his skin cancer on his ear removed. I told him that Dr Arbutus Ped would want him to have it taken care of.

## 2012-01-13 ENCOUNTER — Other Ambulatory Visit: Payer: Self-pay | Admitting: Internal Medicine

## 2012-01-15 ENCOUNTER — Emergency Department (HOSPITAL_COMMUNITY): Payer: Medicare Other

## 2012-01-15 ENCOUNTER — Observation Stay (HOSPITAL_COMMUNITY)
Admission: EM | Admit: 2012-01-15 | Discharge: 2012-01-16 | Disposition: A | Discharge status: Home / Self Care | Payer: Medicare Other | Attending: Internal Medicine | Admitting: Internal Medicine

## 2012-01-15 ENCOUNTER — Encounter (HOSPITAL_COMMUNITY): Payer: Self-pay

## 2012-01-15 DIAGNOSIS — I10 Essential (primary) hypertension: Secondary | ICD-10-CM | POA: Diagnosis present

## 2012-01-15 DIAGNOSIS — K219 Gastro-esophageal reflux disease without esophagitis: Secondary | ICD-10-CM | POA: Diagnosis present

## 2012-01-15 DIAGNOSIS — Z7901 Long term (current) use of anticoagulants: Secondary | ICD-10-CM | POA: Insufficient documentation

## 2012-01-15 DIAGNOSIS — G40909 Epilepsy, unspecified, not intractable, without status epilepticus: Secondary | ICD-10-CM | POA: Insufficient documentation

## 2012-01-15 DIAGNOSIS — Z79899 Other long term (current) drug therapy: Secondary | ICD-10-CM | POA: Insufficient documentation

## 2012-01-15 DIAGNOSIS — L02419 Cutaneous abscess of limb, unspecified: Secondary | ICD-10-CM | POA: Insufficient documentation

## 2012-01-15 DIAGNOSIS — L03116 Cellulitis of left lower limb: Secondary | ICD-10-CM | POA: Diagnosis present

## 2012-01-15 DIAGNOSIS — F17200 Nicotine dependence, unspecified, uncomplicated: Secondary | ICD-10-CM | POA: Diagnosis present

## 2012-01-15 DIAGNOSIS — T18108A Unspecified foreign body in esophagus causing other injury, initial encounter: Principal | ICD-10-CM | POA: Insufficient documentation

## 2012-01-15 DIAGNOSIS — T18128A Food in esophagus causing other injury, initial encounter: Secondary | ICD-10-CM

## 2012-01-15 DIAGNOSIS — G40209 Localization-related (focal) (partial) symptomatic epilepsy and epileptic syndromes with complex partial seizures, not intractable, without status epilepticus: Secondary | ICD-10-CM | POA: Diagnosis present

## 2012-01-15 DIAGNOSIS — J4489 Other specified chronic obstructive pulmonary disease: Secondary | ICD-10-CM | POA: Insufficient documentation

## 2012-01-15 DIAGNOSIS — C3490 Malignant neoplasm of unspecified part of unspecified bronchus or lung: Secondary | ICD-10-CM | POA: Diagnosis present

## 2012-01-15 DIAGNOSIS — I82409 Acute embolism and thrombosis of unspecified deep veins of unspecified lower extremity: Secondary | ICD-10-CM

## 2012-01-15 DIAGNOSIS — J449 Chronic obstructive pulmonary disease, unspecified: Secondary | ICD-10-CM | POA: Diagnosis present

## 2012-01-15 DIAGNOSIS — R1310 Dysphagia, unspecified: Secondary | ICD-10-CM | POA: Diagnosis present

## 2012-01-15 DIAGNOSIS — IMO0002 Reserved for concepts with insufficient information to code with codable children: Secondary | ICD-10-CM | POA: Insufficient documentation

## 2012-01-15 MED ORDER — SODIUM CHLORIDE 0.9 % IV SOLN
Freq: Once | INTRAVENOUS | Status: AC
  Administered 2012-01-15: 20 mL/h via INTRAVENOUS

## 2012-01-15 MED ORDER — GLUCAGON HCL (RDNA) 1 MG IJ SOLR
1.0000 mg | Freq: Once | INTRAMUSCULAR | Status: AC
  Administered 2012-01-15: 1 mg via INTRAVENOUS
  Filled 2012-01-15: qty 1

## 2012-01-15 MED ORDER — SODIUM CHLORIDE 0.9 % IV BOLUS (SEPSIS)
1000.0000 mL | Freq: Once | INTRAVENOUS | Status: AC
  Administered 2012-01-15: 1000 mL via INTRAVENOUS

## 2012-01-15 MED ORDER — NITROGLYCERIN 0.4 MG SL SUBL
0.4000 mg | SUBLINGUAL_TABLET | SUBLINGUAL | Status: AC | PRN
  Administered 2012-01-15: 0.4 mg via SUBLINGUAL
  Filled 2012-01-15: qty 25

## 2012-01-15 MED ORDER — METOCLOPRAMIDE HCL 5 MG/ML IJ SOLN
10.0000 mg | Freq: Once | INTRAMUSCULAR | Status: AC
  Administered 2012-01-15: 10 mg via INTRAVENOUS
  Filled 2012-01-15: qty 2

## 2012-01-15 NOTE — ED Provider Notes (Signed)
History     CSN: [id number]  Arrival date & time 01/15/12  1630   First MD Initiated Contact with Patient 01/15/12 1644      Chief Complaint  Patient presents with  . Airway Obstruction    (Consider location/radiation/quality/duration/timing/severity/associated sxs/prior treatment) HPI Paul [name] is a 78 y.o. male has a history of achalasia and repeated dilations of his esophagus, says he frequently gets things stuck in his throat and feels like they get stuck lower at the top of his belly, he says he walks around and drinks water typically goes down however this morning he ate a piece of chicken from a chicken sandwich and has been trying to flush it down since without relief. He's been regurgitating anything he drinks. He's got some mild nausea. He has no pain. He's had his esophagus dilated a couple times and these had to have food retrieved from his esophagus previously. No chest pain or shortness of breath, no cough, no fevers or chills. Patient also has a history of lung cancer. Everyday smoker one to 2 packs a day.   Past Medical History  Diagnosis Date  . Hypertension   . COPD (chronic obstructive pulmonary disease)   . Hemorrhoids     external  . 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 GERD (gastroesophageal reflux disease)   . Emphysema   . Non-small cell carcinoma of lung dx'd 2006    stage 3  . Lung cancer   . HOH (hard of hearing)     Past Surgical History  Procedure Date  . Inner ear surgery     No family history on file.  History  Substance Use Topics  . Smoking status: Current Every Day Smoker -- 1.5 packs/day  . Smokeless tobacco: Not on file  . Alcohol Use: No      Review of Systems At least 10pt or greater review of systems completed and are negative except where specified in the HPI.  Allergies  Review of patient's allergies indicates no known allergies.  Home Medications   Current Outpatient Rx  Name  Route  Sig   Dispense  Refill  . OXYCODONE-ACETAMINOPHEN 2.5-325 MG PO TABS   Oral   Take 1 tablet by mouth every 6 (six) hours as needed for pain.   15 tablet   0   . PHENYTOIN SODIUM EXTENDED 100 MG PO CAPS   Oral   Take 3 capsules (300 mg total) by mouth daily.   90 capsule   10   . SULFAMETHOXAZOLE-TRIMETHOPRIM 800-160 MG PO TABS   Oral   Take 1 tablet by mouth every 12 (twelve) hours.   20 tablet   0   . TRAMADOL HCL 50 MG PO TABS   Oral   Take 1 tablet (50 mg total) by mouth every 6 (six) hours as needed for pain.   10 tablet   0   . WARFARIN SODIUM 5 MG PO TABS   Oral   Take 5 mg by mouth daily.         . WARFARIN SODIUM 5 MG PO TABS      TAKE 1 TABLET BY MOUTH EVERY DAY   30 tablet   3     BP 130/81  Pulse 94  Temp 97.7 F (36.5 C) (Oral)  Resp 16  SpO2 100%  Physical Exam  Nursing notes reviewed.  Electronic medical record reviewed. VITAL SIGNS:   Filed Vitals:   01/15/12 1757  01/15/12 1815 01/15/12 1900 01/16/12 0100  BP: 116/54 97/44 123/65 137/76  Pulse: 90 31 86 97  Temp:    98 F (36.7 C)  TempSrc:    Oral  Resp: 19 13 22 18   SpO2: 98% 98% 99% 100%   CONSTITUTIONAL: Awake, oriented, appears non-toxic HENT: Atraumatic, normocephalic, oral mucosa pink and moist, airway patent. Nares patent without drainage. External ears normal. EYES: Conjunctiva clear, EOMI, anisocoria - chronic, right pupils 4 mm left pupil 2 mm NECK: Trachea midline, non-tender, supple CARDIOVASCULAR: Normal heart rate, Normal rhythm, No murmurs, rubs, gallops PULMONARY/CHEST: Decreased breath sounds bilaterally worse at the right upper lobe. Wheezes throughout ABDOMINAL: Non-distended, soft, non-tender - no rebound or guarding.  BS normal. NEUROLOGIC: Non-focal, moving all four extremities, no gross sensory or motor deficits. EXTREMITIES: No clubbing, cyanosis, or edema. On the patient's left ankle he's got a dime-sized however oval-sized lesion that is healing/scabbing  granulated. Changes in the lower extremities consistent with stasis dermatitis SKIN: Warm, Dry, No erythema, No rash  ED Course  Procedures (including critical care time)   Labs Reviewed  BASIC METABOLIC PANEL  CBC WITH DIFFERENTIAL   Dg Chest 2 View  01/15/2012  *RADIOLOGY REPORT*  Clinical Data: Short of breath.  Esophageal obstruction.  Lung cancer.  CHEST - 2 VIEW  Comparison: CT chest 09/17/2011  Findings: Right upper lobe consolidation is unchanged from the prior CT and may represent scarring or treated tumor.  No recurrent mass lesion is identified.  There is COPD with emphysema and scarring.  No heart failure or pneumonia.  IMPRESSION: COPD.  Chronic right upper lobe densities stable.  No superimposed acute abnormality.   Original Report Authenticated By: Janeece Riggers, M.D.      1. Food impaction of esophagus   2. COPD (chronic obstructive pulmonary disease)   3. CARCINOMA, LUNG, SQUAMOUS CELL       MDM  Paul [name] is a 78 y.o. male presenting with food impaction. Patient's vitals are within normal limits - no respiratory distress, no concern for aspiration at this point.  Attempted to use nitroglycerin, carbonated beverages, and glucagon without success.  Patient still has sensation that something is trapped and every time he drinks something he regurgitates the fluid.  Patient refused lab draw. However convinced him that we need to obtain some basic labs including an INR prior to an upper endoscopy. Patient agrees.  Discussed case with Dr. Oscar La of [facility], he will come in early in the morning for EGD. Patient is still n.p.o.  Discussed with Dr. Houston Siren to admit patient          Jones Skene, MD 01/16/12 [phone]

## 2012-01-15 NOTE — ED Notes (Addendum)
Documented under wrong pt 

## 2012-01-15 NOTE — ED Notes (Signed)
Patient transported to X-ray 

## 2012-01-15 NOTE — ED Notes (Signed)
Encouraged to drink small sips of warm coke

## 2012-01-15 NOTE — ED Notes (Signed)
md at bedside  Given soda per md orders

## 2012-01-15 NOTE — ED Notes (Signed)
Pt still vomiting at this time, unable to keep down Sprite.

## 2012-01-15 NOTE — ED Notes (Signed)
Pt states "I don't feel as sick as I did before."

## 2012-01-15 NOTE — ED Notes (Signed)
Pt reports he has a piece of chicken stuck in his throat since 0900. Reports trying to drink water to flush it down, without relief. Pt has been vomiting. Pt reports getting chicken stuck in his throat 4x. Has had esophagus stretched 2 times. At present pt denies pain. Pt reports just feeling sick to his stomach.

## 2012-01-16 ENCOUNTER — Encounter (HOSPITAL_COMMUNITY)
Admission: EM | Disposition: A | Discharge status: Home / Self Care | Payer: Self-pay | Source: Home / Self Care | Attending: Emergency Medicine

## 2012-01-16 ENCOUNTER — Encounter (HOSPITAL_COMMUNITY): Payer: Self-pay | Admitting: Internal Medicine

## 2012-01-16 DIAGNOSIS — I82409 Acute embolism and thrombosis of unspecified deep veins of unspecified lower extremity: Secondary | ICD-10-CM

## 2012-01-16 DIAGNOSIS — J449 Chronic obstructive pulmonary disease, unspecified: Secondary | ICD-10-CM

## 2012-01-16 DIAGNOSIS — R1310 Dysphagia, unspecified: Secondary | ICD-10-CM | POA: Diagnosis present

## 2012-01-16 DIAGNOSIS — L03116 Cellulitis of left lower limb: Secondary | ICD-10-CM | POA: Diagnosis present

## 2012-01-16 DIAGNOSIS — K219 Gastro-esophageal reflux disease without esophagitis: Secondary | ICD-10-CM

## 2012-01-16 DIAGNOSIS — T18108A Unspecified foreign body in esophagus causing other injury, initial encounter: Secondary | ICD-10-CM

## 2012-01-16 LAB — CBC WITH DIFFERENTIAL/PLATELET
Basophils Relative: 0 % (ref 0–1)
Eosinophils Absolute: 0.1 10*3/uL (ref 0.0–0.7)
HCT: 44.1 % (ref 39.0–52.0)
Hemoglobin: 15.2 g/dL (ref 13.0–17.0)
MCH: 29.9 pg (ref 26.0–34.0)
MCHC: 34.5 g/dL (ref 30.0–36.0)
MCV: 86.8 fL (ref 78.0–100.0)
Monocytes Absolute: 0.6 10*3/uL (ref 0.1–1.0)
Monocytes Relative: 9 % (ref 3–12)

## 2012-01-16 LAB — BASIC METABOLIC PANEL
BUN: 11 mg/dL (ref 6–23)
Chloride: 104 mEq/L (ref 96–112)
Glucose, Bld: 122 mg/dL — ABNORMAL HIGH (ref 70–99)
Potassium: 4.1 mEq/L (ref 3.5–5.1)

## 2012-01-16 SURGERY — EGD (ESOPHAGOGASTRODUODENOSCOPY)
Anesthesia: Moderate Sedation

## 2012-01-16 MED ORDER — OXYCODONE-ACETAMINOPHEN 2.5-325 MG PO TABS: 1.0000 | ORAL_TABLET | Freq: Four times a day (QID) | ORAL | Status: DC | PRN

## 2012-01-16 MED ORDER — ONDANSETRON HCL 4 MG/2ML IJ SOLN
4.0000 mg | Freq: Once | INTRAMUSCULAR | Status: AC
  Administered 2012-01-16: 4 mg via INTRAVENOUS
  Filled 2012-01-16: qty 2

## 2012-01-16 MED ORDER — ONDANSETRON HCL 4 MG PO TABS: 4.0000 mg | ORAL_TABLET | Freq: Four times a day (QID) | ORAL | Status: DC | PRN

## 2012-01-16 MED ORDER — DEXTROSE-NACL 5-0.9 % IV SOLN
INTRAVENOUS | Status: DC
  Administered 2012-01-16: 05:00:00 via INTRAVENOUS

## 2012-01-16 MED ORDER — ONDANSETRON HCL 4 MG/2ML IJ SOLN: 4.0000 mg | Freq: Four times a day (QID) | INTRAMUSCULAR | Status: DC | PRN

## 2012-01-16 MED ORDER — SULFAMETHOXAZOLE-TRIMETHOPRIM 800-160 MG PO TABS: 1.0000 | ORAL_TABLET | Freq: Two times a day (BID) | ORAL | Status: DC

## 2012-01-16 NOTE — Care Management Note (Signed)
    Page 1 of 1   01/16/2012     12:55:05 PM   CARE MANAGEMENT NOTE 01/16/2012  Patient:  Paul [name], Paul [name]   Account Number:  [id number]  Date Initiated:  01/16/2012  Documentation initiated by:  Lorenda Ishihara  Subjective/Objective Assessment:   78 yo male admitted with food impaction     Action/Plan:   Home   Anticipated DC Date:  01/16/2012   Anticipated DC Plan:  HOME/SELF CARE      DC Planning Services  CM consult      Choice offered to / List presented to:             Status of service:  Completed, signed off Medicare Important Message given?   (If response is "NO", the following Medicare IM given date fields will be blank) Date Medicare IM given:   Date Additional Medicare IM given:    Discharge Disposition:  HOME/SELF CARE  Per UR Regulation:  Reviewed for med. necessity/level of care/duration of stay  If discussed at Long Length of Stay Meetings, dates discussed:    Comments:

## 2012-01-16 NOTE — ED Notes (Signed)
Pt stating "I am trying to get over this sickness right now, tell the doctor I said no". Stressed the importance of the blood work however pt still states he is unwilling to have lab work drawn. Dr.Bonk and Mays RN made aware.

## 2012-01-16 NOTE — H&P (Signed)
[facility] History and Physical  Paul [name] AVW:[id number] DOB: 1930-04-04    PCP:   Lajuana Matte., MD   Chief Complaint: food stuck.  HPI: Paul [name] is an 78 y.o. male with hx of seizure on dilantin, GERD, HTN, COPD, stage 3 non-small cell ca, presents to the ER because he felt food stuck in his chest yesterday.  He had intermittent dysphagia, but generally able to clear the food with water, but not this time.  He had hx of esophageal muscular stricture and had EGD 2011 by Dr Orpah Clinton with dilatation.  He denied alcohol use and has been compliant with his PPI therapy for GERD.  He has no trouble clearing his saliva and is not short of breath.  He admitted that the dysphagia has progressed over the past 6 months.  GI was consulted by EDP and will do EGD early this morning.  He is NPO.  Rewiew of Systems:  Constitutional: Negative for malaise, fever and chills. No significant weight loss or weight gain Eyes: Negative for eye pain, redness and discharge, diplopia, visual changes, or flashes of light. ENMT: Negative for ear pain, hoarseness, nasal congestion, sinus pressure and sore throat. No headaches; tinnitus, drooling Cardiovascular: Negative for chest pain, palpitations, diaphoresis, dyspnea and peripheral edema. ; No orthopnea, PND Respiratory: Negative for cough, hemoptysis, wheezing and stridor. No pleuritic chestpain. Gastrointestinal: Negative for nausea, vomiting, diarrhea, constipation, abdominal pain, melena, blood in stool, hematemesis, jaundice and rectal bleeding.    Genitourinary: Negative for frequency, dysuria, incontinence,flank pain and hematuria; Musculoskeletal: Negative for back pain and neck pain. Negative for swelling and trauma.;  Skin: . Negative for pruritus, rash, abrasions, bruising and skin lesion.; ulcerations Neuro: Negative for headache, lightheadedness and neck stiffness. Negative for weakness, altered level of consciousness , altered mental  status, extremity weakness, burning feet, involuntary movement, seizure and syncope.  Psych: negative for anxiety, depression, insomnia, tearfulness, panic attacks, hallucinations, paranoia, suicidal or homicidal ideation    Past Medical History  Diagnosis Date  . Hypertension   . COPD (chronic obstructive pulmonary disease)   . Hemorrhoids     external  . 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 GERD (gastroesophageal reflux disease)   . Emphysema   . Non-small cell carcinoma of lung dx'd 2006    stage 3  . Lung cancer   . HOH (hard of hearing)     Past Surgical History  Procedure Date  . Inner ear surgery     Medications:  HOME MEDS: Prior to Admission medications   Medication Sig Start Date End Date Taking? Authorizing Provider  oxycodone-acetaminophen (PERCOCET) 2.5-325 MG per tablet Take 1 tablet by mouth every 6 (six) hours as needed for pain. 11/13/11  Yes Tobin Chad, MD  phenytoin (DILANTIN) 100 MG ER capsule Take 3 capsules (300 mg total) by mouth daily. 05/10/11 05/09/12 Yes Zollie Beckers, MD  sulfamethoxazole-trimethoprim (SEPTRA DS) 800-160 MG per tablet Take 1 tablet by mouth every 12 (twelve) hours. 11/13/11  Yes Tobin Chad, MD  traMADol (ULTRAM) 50 MG tablet Take 1 tablet (50 mg total) by mouth every 6 (six) hours as needed for pain. 12/06/11  Yes Linna Hoff, MD  warfarin (COUMADIN) 5 MG tablet Take 5 mg by mouth daily.   Yes Historical Provider, MD     Allergies:  No Known Allergies  Social History:   reports that he has been smoking.  He does not have any smokeless  tobacco history on file. He reports that he does not drink alcohol or use illicit drugs.  Family History: History reviewed. No pertinent family history.   Physical Exam: Filed Vitals:   01/15/12 1757 01/15/12 1815 01/15/12 1900 01/16/12 0100  BP: 116/54 97/44 123/65 137/76  Pulse: 90 31 86 97  Temp:    98 F (36.7 C)  TempSrc:    Oral  Resp: 19 13 22 18    SpO2: 98% 98% 99% 100%   Blood pressure 137/76, pulse 97, temperature 98 F (36.7 C), temperature source Oral, resp. rate 18, SpO2 100.00%.  GEN:  Pleasant patient lying in the stretcher in no acute distress; cooperative with exam. PSYCH:  alert and oriented x4; does not appear anxious or depressed; affect is appropriate. HEENT: Mucous membranes pink and anicteric; PERRLA; EOM intact; no cervical lymphadenopathy nor thyromegaly or carotid bruit; no JVD; There were no stridor. Neck is very supple. Breasts:: Not examined CHEST WALL: No tenderness CHEST: Normal respiration, clear to auscultation bilaterally. But decreased BS. HEART: Regular rate and rhythm.  There are no murmur, rub, or gallops.   BACK: No kyphosis or scoliosis; no CVA tenderness ABDOMEN: soft and non-tender; no masses, no organomegaly, normal abdominal bowel sounds; no pannus; no intertriginous candida. There is no rebound and no distention. Rectal Exam: Not done EXTREMITIES: No bone or joint deformity; age-appropriate arthropathy of the hands and knees; no edema; no ulcerations.  There is no calf tenderness. Genitalia: not examined PULSES: 2+ and symmetric SKIN: Normal hydration no rash or ulceration CNS: Cranial nerves 2-12 grossly intact no focal lateralizing neurologic deficit.  Speech is fluent; uvula elevated with phonation, facial symmetry and tongue midline. DTR are normal bilaterally, cerebella exam is intact, barbinski is negative and strengths are equaled bilaterally.  No sensory loss.   Labs on Admission:  Basic Metabolic Panel:  Lab 01/16/12 1610  NA 137  K 4.1  CL 104  CO2 26  GLUCOSE 122*  BUN 11  CREATININE 0.67  CALCIUM 8.6  MG --  PHOS --   Liver Function Tests: No results found for this basename: AST:5,ALT:5,ALKPHOS:5,BILITOT:5,PROT:5,ALBUMIN:5 in the last 168 hours No results found for this basename: LIPASE:5,AMYLASE:5 in the last 168 hours No results found for this basename: AMMONIA:5 in  the last 168 hours CBC:  Lab 01/16/12 0213  WBC 7.1  NEUTROABS 5.6  HGB 15.2  HCT 44.1  MCV 86.8  PLT 198   Cardiac Enzymes: No results found for this basename: CKTOTAL:5,CKMB:5,CKMBINDEX:5,TROPONINI:5 in the last 168 hours  CBG: No results found for this basename: GLUCAP:5 in the last 168 hours   Radiological Exams on Admission: Dg Chest 2 View  01/15/2012  *RADIOLOGY REPORT*  Clinical Data: Short of breath.  Esophageal obstruction.  Lung cancer.  CHEST - 2 VIEW  Comparison: CT chest 09/17/2011  Findings: Right upper lobe consolidation is unchanged from the prior CT and may represent scarring or treated tumor.  No recurrent mass lesion is identified.  There is COPD with emphysema and scarring.  No heart failure or pneumonia.  IMPRESSION: COPD.  Chronic right upper lobe densities stable.  No superimposed acute abnormality.   Original Report Authenticated By: Janeece Riggers, M.D.      Assessment/Plan Present on Admission:  . Dysphagia . COPD, MILD . GASTROESOPHAGEAL REFLUX DISEASE . HYPERTENSION . SEIZURE DISORDER . TOBACCO ABUSE . CARCINOMA, LUNG, SQUAMOUS CELL   PLAN:  Will admit under obs for esophageal stricture with possible food stuck.  He is placed on NPO  with IVF.  GI will likely perform EGD this morning.  I have continued his meds and he is very stable.  Will admit to [facility] service.  Thank you for allowing me to partake in the care of your lovely patient.    Other plans as per orders.  Code Status: FULL Unk Lightning, MD. [facility] Pager [phone] 7pm to 7am.  01/16/2012, 3:04 AM

## 2012-01-16 NOTE — ED Notes (Signed)
Pt refusing blood draw at this time.  Pt given education on importance of lab work.  Pt still refused.

## 2012-01-16 NOTE — Discharge Summary (Signed)
Physician Discharge Summary  Paul [name] DGU:[id number] DOB: [birth year] DOA: 01/15/2012  PCP: Paul Bruns, MD Oncologist: Dr. Arbutus [name]   Admit date: 01/15/2012 Discharge date: 01/16/2012  Recommendations for Outpatient Follow-up:  1. Followup with primary care doctor for reevaluation of cellulitis. 2. Recommend outpatient followup with GI for further treatment recommendations of esophageal stricture. 3. Close F/U of INR while on Septra. Patient instructed to followup at the urgent care Center for a recheck of his INR in 4 days.  Discharge Diagnoses:  Principal Problem:  *Dysphagia with esophageal food impaction / history of esophageal stricture Active Problems:   CARCINOMA, LUNG, SQUAMOUS CELL   TOBACCO ABUSE   HYPERTENSION   COPD, MILD   GASTROESOPHAGEAL REFLUX DISEASE   SEIZURE DISORDER   Cellulitis of left lower extremity   Discharge Condition: Improved.  Diet recommendation: Regular  History of present illness:  Paul [name] is a 78 year old male who was admitted with an esophageal food impaction on 01/16/2012. He has a known history of esophageal stricture, confirmed by EGD in 2011, status post esophageal dilatation.  Hospital Course by problem:  Principal Problem:  *Dysphagia with food impaction of the esophagus / history of esophageal stricture  GI consult in for possible EGD in the morning. N.p.o. overnight. Brought down to endoscopy this morning but the patient felt that the impaction had cleared on its own. This was confirmed.  Recommend outpatient followup with GI. Active Problems:  CARCINOMA, LUNG, SQUAMOUS CELL  Followup with Dr. Arbutus [name] of oncology.  TOBACCO ABUSE  Counseled regarding tobacco cessation.  HYPERTENSION  Not on any antihypertensives prior to admission. Blood pressure stable.  COPD, MILD  Respiratory status stable, at usual baseline.  GASTROESOPHAGEAL REFLUX DISEASE  Not on routine medications prior to admission. Can  use over-the-counter antacids or PPIs as needed.  SEIZURE DISORDER  No seizures noted. Maintained on Dilantin.  Cellulitis of left lower extremity  Resume Septra (note: Patient states he never took this medication so will send him home with a prescription for a ten-day course). Given # 30 Percocet for ongoing issues with pain.   Procedures:  None.  Consultations:  Dr. Melvia [name], GI.  Discharge Exam: Filed Vitals:   01/16/12 0500  BP: 121/40  Pulse: 61  Temp: 97.4 F (36.3 C)  Resp: 18   Filed Vitals:   01/16/12 0100 01/16/12 0357 01/16/12 0431 01/16/12 0500  BP: 137/76 127/73  121/40  Pulse: 97 91  61  Temp: 98 F (36.7 C) 97.7 F (36.5 C)  97.4 F (36.3 C)  TempSrc: Oral Oral  Oral  Resp: 18 20  18   Height:  5\' 10"  (1.778 m)    Weight:   71.351 kg (157 lb 4.8 oz)   SpO2: 100% 100%  97%    Gen:  NAD Cardiovascular:  RRR, No M/R/G Respiratory: Lungs sounds diminished Gastrointestinal: Abdomen soft, NT/ND with normal active bowel sounds. Extremities: Left lower extremity erythema with a 2 cm scabbed area on the lower anterior shin.   Discharge Instructions      Discharge Orders    Future Appointments: Provider: Department: Dept Phone: Center:   03/25/2012 9:45 AM Paul [name] [facility] MEDICAL ONCOLOGY [phone] None   03/25/2012 10:30 AM [facility] [place] [facility] [phone] [place]   03/26/2012 9:15 AM Paul Gaul, MD [facility] MEDICAL ONCOLOGY [phone] None     Future Orders Please Complete By Expires   Diet general      Discharge  instructions      Comments:   Resume your Bactrim, which is an antibiotic being used to treat the infection in the skin of your left lower leg.   Activity as tolerated - No restrictions      Call MD for:      Scheduling Instructions:   For worsening pain or redness of the left lower extremity, recurrent difficulty swallowing.   Call MD for:   temperature >100.4      Call MD for:  persistant nausea and vomiting          Medication List     As of 01/16/2012  9:23 AM    STOP taking these medications         traMADol 50 MG tablet   Commonly known as: ULTRAM      TAKE these medications         oxycodone-acetaminophen 2.5-325 MG per tablet   Commonly known as: PERCOCET   Take 1 tablet by mouth every 6 (six) hours as needed for pain.      phenytoin 100 MG ER capsule   Commonly known as: DILANTIN   Take 3 capsules (300 mg total) by mouth daily.      sulfamethoxazole-trimethoprim 800-160 MG per tablet   Commonly known as: BACTRIM DS,SEPTRA DS   Take 1 tablet by mouth every 12 (twelve) hours.      warfarin 5 MG tablet   Commonly known as: COUMADIN   Take 5 mg by mouth daily.           The results of significant diagnostics from this hospitalization (including imaging, microbiology, ancillary and laboratory) are listed below for reference.    Significant Diagnostic Studies: Dg Chest 2 View  01/15/2012  *RADIOLOGY REPORT*  Clinical Data: Short of breath.  Esophageal obstruction.  Lung cancer.  CHEST - 2 VIEW  Comparison: CT chest 09/17/2011  Findings: Right upper lobe consolidation is unchanged from the prior CT and may represent scarring or treated tumor.  No recurrent mass lesion is identified.  There is COPD with emphysema and scarring.  No heart failure or pneumonia.  IMPRESSION: COPD.  Chronic right upper lobe densities stable.  No superimposed acute abnormality.   Original Report Authenticated By: Paul [name], M.D.     Microbiology: No results found for this or any previous visit (from the past 240 hour(s)).   Labs: Basic Metabolic Panel:  Lab 01/16/12 3086  NA 137  K 4.1  CL 104  CO2 26  GLUCOSE 122*  BUN 11  CREATININE 0.67  CALCIUM 8.6  MG --  PHOS --   CBC:  Lab 01/16/12 0213  WBC 7.1  NEUTROABS 5.6  HGB 15.2  HCT 44.1  MCV 86.8  PLT 198    Time coordinating discharge: 30  minutes.  Signed:  RAMA,CHRISTINA  Pager [phone] [facility] 01/16/2012, 9:23 AM

## 2012-02-09 ENCOUNTER — Emergency Department (HOSPITAL_COMMUNITY)
Admission: EM | Admit: 2012-02-09 | Discharge: 2012-02-09 | Disposition: A | Discharge status: Home / Self Care | Payer: Medicare Other | Attending: Emergency Medicine | Admitting: Emergency Medicine

## 2012-02-09 ENCOUNTER — Emergency Department (HOSPITAL_COMMUNITY): Payer: Medicare Other

## 2012-02-09 DIAGNOSIS — J449 Chronic obstructive pulmonary disease, unspecified: Secondary | ICD-10-CM | POA: Insufficient documentation

## 2012-02-09 DIAGNOSIS — Z8719 Personal history of other diseases of the digestive system: Secondary | ICD-10-CM | POA: Insufficient documentation

## 2012-02-09 DIAGNOSIS — I10 Essential (primary) hypertension: Secondary | ICD-10-CM | POA: Insufficient documentation

## 2012-02-09 DIAGNOSIS — Z8709 Personal history of other diseases of the respiratory system: Secondary | ICD-10-CM | POA: Insufficient documentation

## 2012-02-09 DIAGNOSIS — W19XXXA Unspecified fall, initial encounter: Secondary | ICD-10-CM

## 2012-02-09 DIAGNOSIS — Z79899 Other long term (current) drug therapy: Secondary | ICD-10-CM | POA: Insufficient documentation

## 2012-02-09 DIAGNOSIS — G40209 Localization-related (focal) (partial) symptomatic epilepsy and epileptic syndromes with complex partial seizures, not intractable, without status epilepticus: Secondary | ICD-10-CM | POA: Insufficient documentation

## 2012-02-09 DIAGNOSIS — S0101XA Laceration without foreign body of scalp, initial encounter: Secondary | ICD-10-CM

## 2012-02-09 DIAGNOSIS — J4489 Other specified chronic obstructive pulmonary disease: Secondary | ICD-10-CM | POA: Insufficient documentation

## 2012-02-09 DIAGNOSIS — Z8679 Personal history of other diseases of the circulatory system: Secondary | ICD-10-CM | POA: Insufficient documentation

## 2012-02-09 DIAGNOSIS — L02419 Cutaneous abscess of limb, unspecified: Secondary | ICD-10-CM | POA: Insufficient documentation

## 2012-02-09 DIAGNOSIS — S0180XA Unspecified open wound of other part of head, initial encounter: Secondary | ICD-10-CM | POA: Insufficient documentation

## 2012-02-09 DIAGNOSIS — F17200 Nicotine dependence, unspecified, uncomplicated: Secondary | ICD-10-CM | POA: Insufficient documentation

## 2012-02-09 DIAGNOSIS — L03116 Cellulitis of left lower limb: Secondary | ICD-10-CM

## 2012-02-09 DIAGNOSIS — Z85118 Personal history of other malignant neoplasm of bronchus and lung: Secondary | ICD-10-CM | POA: Insufficient documentation

## 2012-02-09 DIAGNOSIS — Y929 Unspecified place or not applicable: Secondary | ICD-10-CM | POA: Insufficient documentation

## 2012-02-09 DIAGNOSIS — Z7901 Long term (current) use of anticoagulants: Secondary | ICD-10-CM | POA: Insufficient documentation

## 2012-02-09 DIAGNOSIS — W1809XA Striking against other object with subsequent fall, initial encounter: Secondary | ICD-10-CM | POA: Insufficient documentation

## 2012-02-09 DIAGNOSIS — Z8669 Personal history of other diseases of the nervous system and sense organs: Secondary | ICD-10-CM | POA: Insufficient documentation

## 2012-02-09 DIAGNOSIS — Z23 Encounter for immunization: Secondary | ICD-10-CM | POA: Insufficient documentation

## 2012-02-09 DIAGNOSIS — Y9389 Activity, other specified: Secondary | ICD-10-CM | POA: Insufficient documentation

## 2012-02-09 LAB — CBC
Hemoglobin: 14.8 g/dL (ref 13.0–17.0)
MCHC: 34.7 g/dL (ref 30.0–36.0)
WBC: 8.9 10*3/uL (ref 4.0–10.5)

## 2012-02-09 LAB — PROTIME-INR
INR: 1.1 (ref 0.00–1.49)
Prothrombin Time: 14.1 seconds (ref 11.6–15.2)

## 2012-02-09 LAB — APTT: aPTT: 29 seconds (ref 24–37)

## 2012-02-09 MED ORDER — POTASSIUM CHLORIDE 20 MEQ/15ML (10%) PO LIQD: 40.0000 meq | Freq: Once | ORAL | Status: DC

## 2012-02-09 MED ORDER — TETANUS-DIPHTH-ACELL PERTUSSIS 5-2.5-18.5 LF-MCG/0.5 IM SUSP
0.5000 mL | Freq: Once | INTRAMUSCULAR | Status: AC
  Administered 2012-02-09: 0.5 mL via INTRAMUSCULAR
  Filled 2012-02-09: qty 0.5

## 2012-02-09 MED ORDER — SULFAMETHOXAZOLE-TRIMETHOPRIM 800-160 MG PO TABS: 1.0000 | ORAL_TABLET | Freq: Two times a day (BID) | ORAL | Status: DC

## 2012-02-09 MED ORDER — LIDOCAINE HCL 2 % IJ SOLN: 20.0000 mL | Freq: Once | INTRAMUSCULAR | Status: DC

## 2012-02-09 MED ORDER — HYDROCODONE-ACETAMINOPHEN 5-325 MG PO TABS: 1.0000 | ORAL_TABLET | ORAL | Status: DC | PRN

## 2012-02-09 NOTE — ED Notes (Signed)
PT reported he tripped up one step this afternoon. Pt reported he could not break fall because he was carrying food. Pt reports he fell forward and hit his head on a door jam. Pt arrive to ED with dsy over wound . Bleeding controlled. Pt  denies any LOC at time of fall. EMS gave 4mg  of zofran  For nausea with relief.

## 2012-02-09 NOTE — ED Notes (Signed)
MD at bedside. 

## 2012-02-09 NOTE — ED Notes (Signed)
Back board removed. No back pain reported from patient.

## 2012-02-09 NOTE — ED Provider Notes (Signed)
History     CSN: [id number]  Arrival date & time 02/09/12  1620   First MD Initiated Contact with Patient 02/09/12 1717      Chief Complaint  Patient presents with  . Fall  . Head Laceration    (Consider location/radiation/quality/duration/timing/severity/associated sxs/prior treatment) Patient is a 79 y.o. male presenting with fall and scalp laceration. The history is provided by the patient and medical records. No language interpreter was used.  Fall Incident onset: Patient tripped and fell, striking the right for head on a door jam, and suffering a laceration of the right for head. He also suffered a bruise in the right knee. He is on Coumadin. He was not rendered unconscious. He was brought to [facility] by EMS. Incident: At home. He fell from a height of 1 to 2 ft. Impact surface: He hit a doorjamb. The volume of blood lost was minimal. The point of impact was the head. The pain is present in the head. The pain is mild. He was ambulatory at the scene. There was no entrapment after the fall. There was no drug use involved in the accident. There was no alcohol use involved in the accident. Pertinent negatives include no fever and no loss of consciousness. Exacerbated by: Nothing. Treatment on scene includes a c-collar. Treatments tried: To [facility] ED via EMS.  Head Laceration    Past Medical History  Diagnosis Date  . Hypertension   . COPD (chronic obstructive pulmonary disease)   . Hemorrhoids     external  . 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 GERD (gastroesophageal reflux disease)   . Emphysema   . Non-small cell carcinoma of lung dx'd 2006    stage 3  . Lung cancer   . HOH (hard of hearing)     Past Surgical History  Procedure Date  . Inner ear surgery     No family history on file.  History  Substance Use Topics  . Smoking status: Current Every Day Smoker -- 1.5 packs/day  . Smokeless tobacco: Not on file  . Alcohol Use: No       Review of Systems  Constitutional: Negative for fever and chills.  HENT:       Laceration of the right forehead.  Eyes: Negative.   Respiratory: Negative.   Cardiovascular: Negative.   Gastrointestinal: Negative.   Genitourinary: Negative.   Musculoskeletal:       Contusion right knee. Cellulitis left leg.  Neurological: Negative.  Negative for loss of consciousness.  Psychiatric/Behavioral: Negative.     Allergies  Review of patient's allergies indicates no known allergies.  Home Medications   Current Outpatient Rx  Name  Route  Sig  Dispense  Refill  . OXYCODONE-ACETAMINOPHEN 2.5-325 MG PO TABS   Oral   Take 1 tablet by mouth every 6 (six) hours as needed for pain.   30 tablet   0   . PHENYTOIN SODIUM EXTENDED 100 MG PO CAPS   Oral   Take 3 capsules (300 mg total) by mouth daily.   90 capsule   10   . SULFAMETHOXAZOLE-TRIMETHOPRIM 800-160 MG PO TABS   Oral   Take 1 tablet by mouth every 12 (twelve) hours.   20 tablet   0   . WARFARIN SODIUM 5 MG PO TABS   Oral   Take 5 mg by mouth daily.           BP 143/75  Pulse 78  Temp 97.5 F (36.4 C) (Oral)  Resp 18  SpO2 100%  Physical Exam  Nursing note and vitals reviewed. Constitutional: He is oriented to person, place, and time.       Slender elderly man, in no distress.  HENT:       He has a 4 cm laceration on the right for head. There is no bony deformity of the skull. There is no foreign body in the wound.  Eyes: Conjunctivae normal and EOM are normal. Pupils are equal, round, and reactive to light. No scleral icterus.  Neck: Normal range of motion. Neck supple.  Cardiovascular: Normal rate, regular rhythm and normal heart sounds.   Pulmonary/Chest: Effort normal and breath sounds normal.  Abdominal: Soft. Bowel sounds are normal.  Musculoskeletal:       Has redness and swelling of the right lower leg and a 1 cm scab on the right shin.  Has contusion over the right patella.  Neurological:  He is alert and oriented to person, place, and time.       No sensory or motor deficit.  Skin: There is erythema.       Erythema and swelling, appears chronic, left lower leg.  Psychiatric: He has a normal mood and affect. His behavior is normal.    ED Course  LACERATION REPAIR Date/Time: 02/09/2012 7:57 PM Performed by: Osvaldo Human Authorized by: Osvaldo Human Consent: Verbal consent obtained. Risks and benefits: risks, benefits and alternatives were discussed Consent given by: patient Patient understanding: patient states understanding of the procedure being performed Patient consent: the patient's understanding of the procedure matches consent given Site marked: the operative site was not marked Patient identity confirmed: verbally with patient Time out: Immediately prior to procedure a "time out" was called to verify the correct patient, procedure, equipment, support staff and site/side marked as required. Laceration length: 4 cm Foreign bodies: no foreign bodies Tendon involvement: none Nerve involvement: none Vascular damage: yes Anesthesia: local infiltration Local anesthetic: lidocaine 2% without epinephrine Preparation: Patient was prepped and draped in the usual sterile fashion. Irrigation solution: saline Irrigation method: syringe Amount of cleaning: standard Debridement: none Degree of undermining: none Skin closure: staples Number of sutures: 8 Approximation: loose Approximation difficulty: simple Patient tolerance: Patient tolerated the procedure well with no immediate complications.   (including critical care time)   Labs Reviewed  CBC  PROTIME-INR  APTT   5:36 PM Pt seen --> physical exam performed.  C-collar taken off by me. Lab workup and x-rays ordered.  Suture cart requested.  TDAP ordered.  7:37 PM Results for orders placed during the hospital encounter of 02/09/12  CBC      Component Value Range   WBC 8.9  4.0 - 10.5 K/uL   RBC  4.91  4.22 - 5.81 MIL/uL   Hemoglobin 14.8  13.0 - 17.0 g/dL   HCT 16.1  09.6 - 04.5 %   MCV 87.0  78.0 - 100.0 fL   MCH 30.1  26.0 - 34.0 pg   MCHC 34.7  30.0 - 36.0 g/dL   RDW 40.9  81.1 - 91.4 %   Platelets 185  150 - 400 K/uL  PROTIME-INR      Component Value Range   Prothrombin Time 14.1  11.6 - 15.2 seconds   INR 1.10  0.00 - 1.49  APTT      Component Value Range   aPTT 29  24 - 37 seconds    Ct Head Wo Contrast  02/09/2012  *  RADIOLOGY REPORT*  Clinical Data: Fall.  Laceration right forehead.  Patient on Coumadin.  CT HEAD WITHOUT CONTRAST  Technique:  Contiguous axial images were obtained from the base of the skull through the vertex without contrast.  Comparison: CT head 07/13/2011  Findings: Stable cerebral volume loss and mild patchy chronic microvascular ischemic changes.  Negative for hemorrhage, hydrocephalus, mass effect, mass lesion, or evidence of acute infarction.  There is a linearly oriented laceration in the right frontal scalp, which extends to the outer table of the skull.  Underlying skull is intact.  No scalp hematoma is seen.  IMPRESSION:  1.  Right frontal scalp laceration extends to the outer table of the skull. 2.  The skull is intact. 3.  No acute intracranial abnormality.   Original Report Authenticated By: Britta Mccreedy, M.D.    Dg Knee Complete 4 Views Right  02/09/2012  *RADIOLOGY REPORT*  Clinical Data: Fall with pain and bruising to the right knee.  RIGHT KNEE - COMPLETE 4+ VIEW  Comparison: None.  Findings: Four views of the right knee were obtained.  Negative for acute fracture or dislocation.  Enthesopathic changes involving the superior aspect of the patella.  No evidence for a suprapatellar joint effusion.  IMPRESSION: No acute bony abnormality to the right knee.   Original Report Authenticated By: Richarda Overlie, M.D.    Lab and x-rays are negative.  PT and INR are normal.  He does not know who prescribes his warfarin.  7:58 PM Wound repaired with staples.   Rx norco for pain, bactrim DS for cellulitis of left leg.  He will need to return here in 7 days for staple removal.  IMP:  Fall Scalp laceration Cellulitis of left lower leg.               Carleene Cooper III, MD 02/09/12 2004

## 2012-02-17 ENCOUNTER — Encounter (HOSPITAL_COMMUNITY): Payer: Self-pay | Admitting: *Deleted

## 2012-02-17 ENCOUNTER — Emergency Department (HOSPITAL_COMMUNITY)
Admission: EM | Admit: 2012-02-17 | Discharge: 2012-02-17 | Disposition: A | Discharge status: Home / Self Care | Payer: Medicare Other | Attending: Emergency Medicine | Admitting: Emergency Medicine

## 2012-02-17 DIAGNOSIS — T148XXA Other injury of unspecified body region, initial encounter: Secondary | ICD-10-CM

## 2012-02-17 DIAGNOSIS — G40209 Localization-related (focal) (partial) symptomatic epilepsy and epileptic syndromes with complex partial seizures, not intractable, without status epilepticus: Secondary | ICD-10-CM | POA: Insufficient documentation

## 2012-02-17 DIAGNOSIS — J449 Chronic obstructive pulmonary disease, unspecified: Secondary | ICD-10-CM | POA: Insufficient documentation

## 2012-02-17 DIAGNOSIS — K219 Gastro-esophageal reflux disease without esophagitis: Secondary | ICD-10-CM | POA: Insufficient documentation

## 2012-02-17 DIAGNOSIS — Z5189 Encounter for other specified aftercare: Secondary | ICD-10-CM

## 2012-02-17 DIAGNOSIS — Z4802 Encounter for removal of sutures: Secondary | ICD-10-CM | POA: Insufficient documentation

## 2012-02-17 DIAGNOSIS — Z7901 Long term (current) use of anticoagulants: Secondary | ICD-10-CM | POA: Insufficient documentation

## 2012-02-17 DIAGNOSIS — W19XXXA Unspecified fall, initial encounter: Secondary | ICD-10-CM | POA: Insufficient documentation

## 2012-02-17 DIAGNOSIS — J4489 Other specified chronic obstructive pulmonary disease: Secondary | ICD-10-CM | POA: Insufficient documentation

## 2012-02-17 DIAGNOSIS — Z8709 Personal history of other diseases of the respiratory system: Secondary | ICD-10-CM | POA: Insufficient documentation

## 2012-02-17 DIAGNOSIS — Z79899 Other long term (current) drug therapy: Secondary | ICD-10-CM | POA: Insufficient documentation

## 2012-02-17 DIAGNOSIS — Z85118 Personal history of other malignant neoplasm of bronchus and lung: Secondary | ICD-10-CM | POA: Insufficient documentation

## 2012-02-17 DIAGNOSIS — Z8679 Personal history of other diseases of the circulatory system: Secondary | ICD-10-CM | POA: Insufficient documentation

## 2012-02-17 DIAGNOSIS — I10 Essential (primary) hypertension: Secondary | ICD-10-CM | POA: Insufficient documentation

## 2012-02-17 DIAGNOSIS — F17200 Nicotine dependence, unspecified, uncomplicated: Secondary | ICD-10-CM | POA: Insufficient documentation

## 2012-02-17 MED ORDER — OXYCODONE-ACETAMINOPHEN 5-325 MG PO TABS: ORAL_TABLET | ORAL | Status: DC

## 2012-02-17 MED ORDER — SULFAMETHOXAZOLE-TRIMETHOPRIM 800-160 MG PO TABS: 1.0000 | ORAL_TABLET | Freq: Two times a day (BID) | ORAL | Status: DC

## 2012-02-17 NOTE — ED Notes (Signed)
Security paged to take pt to vehicle, pt states, "I get short of breath when I walk long distances."

## 2012-02-17 NOTE — ED Notes (Signed)
To ED to have staples removed from top of head. Placed 8 days ago. No s/s infection noted.

## 2012-02-17 NOTE — ED Provider Notes (Signed)
History     CSN: [id number]  Arrival date & time 02/17/12  1218   First MD Initiated Contact with Patient 02/17/12 1402      Chief Complaint  Patient presents with  . Suture / Staple Removal    (Consider location/radiation/quality/duration/timing/severity/associated sxs/prior treatment) HPI  Paul [name] is a 79 y.o. male presenting for staple remover to scalp. Patient fell and hurt his head a days ago. He reports no issues with the wound including redness, discharge, pain, fever, nausea vomiting. Patient has lung cancer and also has a nonhealing wound to the left leg that he's had for several months.  Past Medical History  Diagnosis Date  . Hypertension   . COPD (chronic obstructive pulmonary disease)   . Hemorrhoids     external  . 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 GERD (gastroesophageal reflux disease)   . Emphysema   . Non-small cell carcinoma of lung dx'd 2006    stage 3  . Lung cancer   . HOH (hard of hearing)     Past Surgical History  Procedure Date  . Inner ear surgery     History reviewed. No pertinent family history.  History  Substance Use Topics  . Smoking status: Current Every Day Smoker -- 1.5 packs/day  . Smokeless tobacco: Not on file  . Alcohol Use: No      Review of Systems  Constitutional: Negative for fever.  Respiratory: Negative for shortness of breath.   Cardiovascular: Negative for chest pain.  Gastrointestinal: Negative for nausea, vomiting, abdominal pain and diarrhea.  Skin: Positive for wound.  All other systems reviewed and are negative.    Allergies  Review of patient's allergies indicates no known allergies.  Home Medications   Current Outpatient Rx  Name  Route  Sig  Dispense  Refill  . HYDROCODONE-ACETAMINOPHEN 5-325 MG PO TABS   Oral   Take 1 tablet by mouth every 4 (four) hours as needed for pain.   20 tablet   0   . OXYCODONE-ACETAMINOPHEN 2.5-325 MG PO TABS   Oral   Take 1 tablet by  mouth every 6 (six) hours as needed for pain.   30 tablet   0   . PHENYTOIN SODIUM EXTENDED 100 MG PO CAPS   Oral   Take 3 capsules (300 mg total) by mouth daily.   90 capsule   10   . SULFAMETHOXAZOLE-TRIMETHOPRIM 800-160 MG PO TABS   Oral   Take 1 tablet by mouth every 12 (twelve) hours.   20 tablet   0   . WARFARIN SODIUM 5 MG PO TABS   Oral   Take 5 mg by mouth daily.         . OXYCODONE-ACETAMINOPHEN 5-325 MG PO TABS      1 to 2 tabs PO q6hrs  PRN for pain   15 tablet   0   . SULFAMETHOXAZOLE-TRIMETHOPRIM 800-160 MG PO TABS   Oral   Take 1 tablet by mouth every 12 (twelve) hours.   14 tablet   0     BP 125/85  Pulse 78  Temp 97.8 F (36.6 C) (Oral)  Resp 16  SpO2 99%  Physical Exam  Nursing note and vitals reviewed. Constitutional: He is oriented to person, place, and time. He appears well-developed and well-nourished. No distress.  HENT:  Head: Normocephalic and atraumatic.  Mouth/Throat: Oropharynx is clear and moist.  Eyes: Conjunctivae normal and EOM are normal.  Cardiovascular: Normal rate.   Pulmonary/Chest: Effort normal and breath sounds normal. No stridor. No respiratory distress. He has no wheezes. He has no rales. He exhibits no tenderness.  Abdominal: Soft. Bowel sounds are normal.  Musculoskeletal: Normal range of motion.  Neurological: He is alert and oriented to person, place, and time.  Skin:       Well-healing laceration to frontal right scalp with 10 Sutures in Pl., clean dry and intact. Patient also has a 2 x 3 cm wound to the anterior distal left shin. It is partial thickness with no active drainage, no surrounding cellulitis, warmth or tenderness.  Psychiatric: He has a normal mood and affect.    ED Course  Procedures (including critical care time)  SUTURE REMOVAL Performed by: Wynetta Emery  Consent: Verbal consent obtained. Patient identity confirmed: provided demographic data Time out: Immediately prior to procedure  a "time out" was called to verify the correct patient, procedure, equipment, support staff and site/side marked as required.  Location details: Right frontal scalp  Wound Appearance: clean  Sutures/Staples Removed: 10 Staples   Facility: sutures placed in this facility Patient tolerance: Patient tolerated the procedure well with no immediate complications.    Labs Reviewed - No data to display No results found.   1. Visit for wound care   2. Encounter for staple removal   3. Nonhealing nonsurgical wound       MDM  Patient with well-healing wound to head Staples removed. However he does have a nonhealing ulcer to the left leg. For this I will start him on Bactrim give him pain control and encourage him to visit the wound care clinic.   Pt verbalized understanding and agrees with care plan. Outpatient follow-up and return precautions given.    New Prescriptions   OXYCODONE-ACETAMINOPHEN (PERCOCET/ROXICET) 5-325 MG PER TABLET    1 to 2 tabs PO q6hrs  PRN for pain   SULFAMETHOXAZOLE-TRIMETHOPRIM (SEPTRA DS) 800-160 MG PER TABLET    Take 1 tablet by mouth every 12 (twelve) hours.    Wynetta Emery, PA-C 02/18/12 [phone]

## 2012-02-18 NOTE — ED Provider Notes (Signed)
Medical screening examination/treatment/procedure(s) were performed by non-physician practitioner and as supervising physician I was immediately available for consultation/collaboration.   Carleene Cooper III, MD 02/18/12 [phone]

## 2012-03-17 ENCOUNTER — Encounter (HOSPITAL_BASED_OUTPATIENT_CLINIC_OR_DEPARTMENT_OTHER): Payer: Medicare Other | Attending: General Surgery

## 2012-03-17 DIAGNOSIS — K219 Gastro-esophageal reflux disease without esophagitis: Secondary | ICD-10-CM | POA: Insufficient documentation

## 2012-03-17 DIAGNOSIS — Z79899 Other long term (current) drug therapy: Secondary | ICD-10-CM | POA: Insufficient documentation

## 2012-03-17 DIAGNOSIS — Z85118 Personal history of other malignant neoplasm of bronchus and lung: Secondary | ICD-10-CM | POA: Insufficient documentation

## 2012-03-17 DIAGNOSIS — I87319 Chronic venous hypertension (idiopathic) with ulcer of unspecified lower extremity: Secondary | ICD-10-CM | POA: Insufficient documentation

## 2012-03-17 DIAGNOSIS — L97909 Non-pressure chronic ulcer of unspecified part of unspecified lower leg with unspecified severity: Secondary | ICD-10-CM | POA: Insufficient documentation

## 2012-03-17 DIAGNOSIS — I10 Essential (primary) hypertension: Secondary | ICD-10-CM | POA: Insufficient documentation

## 2012-03-17 DIAGNOSIS — X58XXXA Exposure to other specified factors, initial encounter: Secondary | ICD-10-CM | POA: Insufficient documentation

## 2012-03-17 DIAGNOSIS — F17200 Nicotine dependence, unspecified, uncomplicated: Secondary | ICD-10-CM | POA: Insufficient documentation

## 2012-03-17 DIAGNOSIS — Z7901 Long term (current) use of anticoagulants: Secondary | ICD-10-CM | POA: Insufficient documentation

## 2012-03-17 DIAGNOSIS — J438 Other emphysema: Secondary | ICD-10-CM | POA: Insufficient documentation

## 2012-03-18 NOTE — H&P (Signed)
Paul [name], Paul [name] NO.:  [id number]  MEDICAL RECORD NO.:  [id number]  LOCATION:  FOOT                         FACILITY:  [facility]  PHYSICIAN:  Joanne Gavel, M.D.        DATE OF BIRTH:  12-31-1930  DATE OF ADMISSION:  03/17/2012 DATE OF DISCHARGE:                             HISTORY & PHYSICAL   CHIEF COMPLAINT:  Wound, left leg.  HISTORY OF PRESENT ILLNESS:  This is a 79 year old male who hurt his leg approximately 4 months ago.  This started as a blood blister and then has eventuated into an open ulcer.  It is extremely tender.  It has not responded to routine treatments.  PAST MEDICAL HISTORY:  Significant for: 1. Hypertension. 2. COPD. 3. Cancer of the lung, treated 6 or 7 years ago. 4. Emphysema. 5. Chronic reflux. 6. Seizures. 7. Hard of hearing.  PAST SURGICAL HISTORY:  Inner ear surgery and lung surgery.  SOCIAL HISTORY:  Cigarettes, he still smokes a pack and half a day. Alcohol, none.  MEDICATIONS:  Hydrocodone, Ventolin, Bactrim, and warfarin.  ALLERGIES:  None.  REVIEW OF SYSTEMS:  As above.  PHYSICAL EXAMINATION:  VITAL SIGNS:  Temperature 97.7, pulse 91, respirations 17, blood pressure 134/73. GENERAL APPEARANCE:  Well developed, very thin, in no distress. EYES, EARS, NOSE, AND THROAT:  Normal. CHEST:  Clear. HEART:  Regular rhythm. ABDOMEN:  Not examined. EXTREMITIES:  Peripheral pulses are not easily palpated.  ABI at first could not be measured but it is now measured at 95.  On the anterior surface of the left leg, there is a 1.4 x 0.1 x 0.2 deep wound which covered with a thick slough.  The slough was curetted.  The bone is not exposed, but is very close underneath the surface of the wound.  IMPRESSION:  Trauma with chronic venous hypertension stasis changes resulting an ulcer.  PLAN OF TREATMENT:  Start with Santyl and Hydrogel.  He may need biological skin substitutes.  We will see him in 7 days.     Joanne Gavel,  M.D.     RA/MEDQ  D:  03/17/2012  T:  03/18/2012  Job:  [id number]

## 2012-03-24 ENCOUNTER — Encounter (HOSPITAL_BASED_OUTPATIENT_CLINIC_OR_DEPARTMENT_OTHER): Payer: Medicare Other | Attending: General Surgery

## 2012-03-24 DIAGNOSIS — L97809 Non-pressure chronic ulcer of other part of unspecified lower leg with unspecified severity: Secondary | ICD-10-CM | POA: Insufficient documentation

## 2012-03-24 DIAGNOSIS — I872 Venous insufficiency (chronic) (peripheral): Secondary | ICD-10-CM | POA: Insufficient documentation

## 2012-03-25 ENCOUNTER — Telehealth: Payer: Self-pay | Admitting: *Deleted

## 2012-03-25 ENCOUNTER — Ambulatory Visit (HOSPITAL_COMMUNITY): Admission: RE | Admit: 2012-03-25 | Payer: Medicare Other | Source: Ambulatory Visit

## 2012-03-25 ENCOUNTER — Other Ambulatory Visit: Payer: Medicare Other

## 2012-03-25 NOTE — Telephone Encounter (Signed)
[facility] for lab/ CT.  Onc tx sch filled out to r/s these appt and his f/u.  Unable to leave msg on vm.  SLJ

## 2012-03-26 ENCOUNTER — Telehealth: Payer: Self-pay | Admitting: Internal Medicine

## 2012-03-26 ENCOUNTER — Ambulatory Visit: Payer: Medicare Other | Admitting: Internal Medicine

## 2012-04-14 ENCOUNTER — Other Ambulatory Visit: Payer: Medicare Other | Admitting: Lab

## 2012-04-14 ENCOUNTER — Ambulatory Visit (HOSPITAL_COMMUNITY): Payer: Medicare Other

## 2012-04-14 ENCOUNTER — Other Ambulatory Visit (HOSPITAL_COMMUNITY): Payer: Medicare Other

## 2012-04-15 ENCOUNTER — Telehealth: Payer: Self-pay | Admitting: Internal Medicine

## 2012-04-15 ENCOUNTER — Other Ambulatory Visit: Payer: Self-pay | Admitting: *Deleted

## 2012-04-15 NOTE — Telephone Encounter (Signed)
called pt to give lab appt  time, no answer...sp

## 2012-04-16 ENCOUNTER — Ambulatory Visit (HOSPITAL_COMMUNITY)
Admission: RE | Admit: 2012-04-16 | Discharge: 2012-04-16 | Disposition: A | Discharge status: Home / Self Care | Payer: Medicare Other | Source: Ambulatory Visit | Attending: Internal Medicine | Admitting: Internal Medicine

## 2012-04-16 ENCOUNTER — Encounter: Payer: Self-pay | Admitting: Internal Medicine

## 2012-04-16 ENCOUNTER — Ambulatory Visit (HOSPITAL_COMMUNITY): Admission: RE | Admit: 2012-04-16 | Payer: Medicare Other | Source: Ambulatory Visit

## 2012-04-16 ENCOUNTER — Other Ambulatory Visit (HOSPITAL_BASED_OUTPATIENT_CLINIC_OR_DEPARTMENT_OTHER): Payer: Medicare Other | Admitting: Lab

## 2012-04-16 ENCOUNTER — Ambulatory Visit (HOSPITAL_BASED_OUTPATIENT_CLINIC_OR_DEPARTMENT_OTHER): Payer: Medicare Other | Admitting: Internal Medicine

## 2012-04-16 VITALS — BP 123/71 | HR 53 | Temp 96.7°F | Resp 20 | Ht 70.0 in | Wt 155.7 lb

## 2012-04-16 DIAGNOSIS — F17200 Nicotine dependence, unspecified, uncomplicated: Secondary | ICD-10-CM

## 2012-04-16 DIAGNOSIS — Z85118 Personal history of other malignant neoplasm of bronchus and lung: Secondary | ICD-10-CM

## 2012-04-16 DIAGNOSIS — I82409 Acute embolism and thrombosis of unspecified deep veins of unspecified lower extremity: Secondary | ICD-10-CM

## 2012-04-16 DIAGNOSIS — K449 Diaphragmatic hernia without obstruction or gangrene: Secondary | ICD-10-CM | POA: Insufficient documentation

## 2012-04-16 DIAGNOSIS — C3490 Malignant neoplasm of unspecified part of unspecified bronchus or lung: Secondary | ICD-10-CM

## 2012-04-16 DIAGNOSIS — J438 Other emphysema: Secondary | ICD-10-CM | POA: Insufficient documentation

## 2012-04-16 DIAGNOSIS — R0602 Shortness of breath: Secondary | ICD-10-CM | POA: Insufficient documentation

## 2012-04-16 DIAGNOSIS — Z86718 Personal history of other venous thrombosis and embolism: Secondary | ICD-10-CM

## 2012-04-16 DIAGNOSIS — Z86711 Personal history of pulmonary embolism: Secondary | ICD-10-CM

## 2012-04-16 LAB — COMPREHENSIVE METABOLIC PANEL (CC13)
AST: 12 U/L (ref 5–34)
Alkaline Phosphatase: 125 U/L (ref 40–150)
BUN: 11.6 mg/dL (ref 7.0–26.0)
Creatinine: 0.8 mg/dL (ref 0.7–1.3)
Potassium: 4.6 mEq/L (ref 3.5–5.1)

## 2012-04-16 LAB — CBC WITH DIFFERENTIAL/PLATELET
Basophils Absolute: 0 10*3/uL (ref 0.0–0.1)
EOS%: 2.7 % (ref 0.0–7.0)
HCT: 46 % (ref 38.4–49.9)
HGB: 15.6 g/dL (ref 13.0–17.1)
LYMPH%: 16.9 % (ref 14.0–49.0)
MCH: 29.5 pg (ref 27.2–33.4)
MCV: 87.1 fL (ref 79.3–98.0)
MONO%: 10.3 % (ref 0.0–14.0)
NEUT%: 69.9 % (ref 39.0–75.0)

## 2012-04-16 MED ORDER — IOHEXOL 300 MG/ML  SOLN
80.0000 mL | Freq: Once | INTRAMUSCULAR | Status: AC | PRN
  Administered 2012-04-16: 80 mL via INTRAVENOUS

## 2012-04-16 NOTE — Progress Notes (Signed)
Paul [name]   [phone]) [phone]  OFFICE PROGRESS NOTE  Paul Bruns, MD [facility] [address] [place] [address]  PRINCIPAL DIAGNOSES:  1. Stage IIIA non-small cell lung cancer diagnosed in November 2005 with disease recurrence in February 2007. 2. History of pulmonary emboli diagnosed incidentally on CT scan of the chest in May 2006. 3. Right lower extremity deep venous thrombosis diagnosed in August 2007 and the patient was off anticoagulation at that time.  PRIOR THERAPY:  1. Status post concurrent chemoradiation with weekly carboplatin and paclitaxel. Last dose was given February 07, 2004. 2. Status post 3 cycles of consolidation chemotherapy with docetaxel. Last dose was given April 04, 2004. 3. Status post 12 cycles of single-agent Alimta for disease recurrence. Last dose was given December 26, 2005 and the patient has disease stabilization since that time.  CURRENT THERAPY.:  1. Observation for lung cancer. 2. Coumadin 2.5 mg p.o. daily except Monday and Thursday the patient on 5 mg for history of deep venous thrombosis and pulmonary emboli.  INTERVAL HISTORY: Paul [name] 79 y.o. [name] returns to the clinic today for routine six-month followup visit. The patient is feeling fine today with no specific complaints except for mild fatigue. He denied having any significant chest pain but continues to have shortness breath with exertion, no cough or hemoptysis. Unfortunately he continues to smoke. The patient denied having any significant weight loss or night sweats. He had repeat CT scan of the chest performed recently and he is here for evaluation and discussion of his scan results.  MEDICAL HISTORY: Past Medical History  Diagnosis Date  . Hypertension   . COPD (chronic obstructive pulmonary disease)   . Hemorrhoids     external  . 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 GERD (gastroesophageal  reflux disease)   . Emphysema   . Non-small cell carcinoma of lung dx'd 2006    stage 3  . Lung cancer   . HOH (hard of hearing)     ALLERGIES:  has No Known Allergies.  MEDICATIONS:  Current Outpatient Prescriptions  Medication Sig Dispense Refill  . HYDROcodone-acetaminophen (NORCO/VICODIN) 5-325 MG per tablet Take 1 tablet by mouth every 4 (four) hours as needed for pain.  20 tablet  0  . oxycodone-acetaminophen (PERCOCET) 2.5-325 MG per tablet Take 1 tablet by mouth every 6 (six) hours as needed for pain.  30 tablet  0  . phenytoin (DILANTIN) 100 MG ER capsule Take 3 capsules (300 mg total) by mouth daily.  90 capsule  10  . sulfamethoxazole-trimethoprim (SEPTRA DS) 800-160 MG per tablet Take 1 tablet by mouth every 12 (twelve) hours.  20 tablet  0  . sulfamethoxazole-trimethoprim (SEPTRA DS) 800-160 MG per tablet Take 1 tablet by mouth every 12 (twelve) hours.  14 tablet  0  . warfarin (COUMADIN) 5 MG tablet Take 5 mg by mouth daily.       No current facility-administered medications for this visit.    SURGICAL HISTORY:  Past Surgical History  Procedure Laterality Date  . Inner ear surgery      REVIEW OF SYSTEMS:  A comprehensive review of systems was negative except for: Constitutional: positive for fatigue   PHYSICAL EXAMINATION: General appearance: alert, cooperative, fatigued and no distress Head: Normocephalic, without obvious abnormality, atraumatic Neck: no adenopathy Resp: clear to auscultation bilaterally Cardio: regular rate and rhythm, S1, S2 normal, no murmur, click,  rub or gallop GI: soft, non-tender; bowel sounds normal; no masses,  no organomegaly Extremities: extremities normal, atraumatic, no cyanosis or edema  ECOG PERFORMANCE STATUS: 1 - Symptomatic but completely ambulatory  Blood pressure 123/71, pulse 53, temperature 96.7 F (35.9 C), temperature source Oral, resp. rate 20, height 5\' 10"  (1.778 m), weight 155 lb 11.2 oz (70.625 kg), SpO2  99.00%.  LABORATORY DATA: Lab Results  Component Value Date   WBC 4.8 04/16/2012   HGB 15.6 04/16/2012   HCT 46.0 04/16/2012   MCV 87.1 04/16/2012   PLT 197 04/16/2012      Chemistry      Component Value Date/Time   NA 136 04/16/2012 0832   NA 137 01/16/2012 0213   NA 142 02/15/2011 0956   K 4.6 04/16/2012 0832   K 4.1 01/16/2012 0213   K 4.4 02/15/2011 0956   CL 100 04/16/2012 0832   CL 104 01/16/2012 0213   CL 99 02/15/2011 0956   CO2 27 04/16/2012 0832   CO2 26 01/16/2012 0213   CO2 29 02/15/2011 0956   BUN 11.6 04/16/2012 0832   BUN 11 01/16/2012 0213   BUN 8 02/15/2011 0956   CREATININE 0.8 04/16/2012 0832   CREATININE 0.67 01/16/2012 0213   CREATININE 0.5* 02/15/2011 0956      Component Value Date/Time   CALCIUM 9.4 04/16/2012 0832   CALCIUM 8.6 01/16/2012 0213   CALCIUM 8.8 02/15/2011 0956   ALKPHOS 125 04/16/2012 0832   ALKPHOS 109* 02/15/2011 0956   ALKPHOS 123* 10/30/2009 0901   AST 12 04/16/2012 0832   AST 18 02/15/2011 0956   AST 18 10/30/2009 0901   ALT 9 04/16/2012 0832   ALT 16 10/30/2009 0901   BILITOT 0.49 04/16/2012 0832   BILITOT 0.50 02/15/2011 0956   BILITOT 0.7 10/30/2009 0901       RADIOGRAPHIC STUDIES: Ct Chest W Contrast  04/16/2012  *RADIOLOGY REPORT*  Clinical Data: 79 year old [name] with shortness of breath.  Lung cancer.  Chemotherapy completed in 2008.  CT CHEST WITH CONTRAST  Technique:  Multidetector CT imaging of the chest was performed following the standard protocol during bolus administration of intravenous contrast.  Contrast: 80mL OMNIPAQUE IOHEXOL 300 MG/ML  SOLN  Comparison: 09/17/2011 and earlier.  Findings: Retained secretions in the lower trachea similar to the most recent comparison.  Stable 6 mm left lung nodule on series 5 image 19.  Stable right greater than left upper lobe emphysema. Chronic consolidation and bronchiectasis along the posterior right major fissure, probably sequelae of XRT.  Interval improved proximal right upper lobe  bronchus patency.  Widespread lower lobe emphysema, more extensive on the right but with areas of early honeycombing in the left costophrenic sulcus.  No pneumothorax.  No pleural effusion.  Stable visualized osseous structures.  Stable and negative visualized upper abdominal viscera aside from chronic hiatal hernia.  No pericardial effusion.  Stable mediastinum with no lymphadenopathy.  Stable thoracic inlet with tiny right thyroid hypodense nodule of doubtful significance.  No axillary lymphadenopathy.  Extensive atherosclerosis of the major arteries including the great vessels, aorta, and coronary arteries.  Somewhat bulky mural plaque or thrombus in the proximal abdominal aorta is stable.  IMPRESSION: Stable CT appearance of the chest since 09/17/2011. Chronic aspirated or retained secretions in the lower trachea. Moderate to severe emphysema superimposed on post-treatment changes to the right lung.   Original Report Authenticated By: Erskine Speed, M.D.     ASSESSMENT: This is a very pleasant 79 years  old white [name] with history of recurrent non-small cell lung cancer has been observation since December of 2007 with no evidence for disease progression.   PLAN: I discussed the scan results with the patient today. I recommended for him to continue on observation with repeat CT scan of the chest in 6 months. I strongly encouraged him to quit smoking but unfortunately he is not trying.  He was advised to call immediately if he has any concerning symptoms in the interval.  All questions were answered. The patient knows to call the clinic with any problems, questions or concerns. We can certainly see the patient much sooner if necessary.

## 2012-04-18 NOTE — Patient Instructions (Signed)
No evidence for disease progression on his recent scan.  Follow Up in 6 months.

## 2012-05-29 ENCOUNTER — Other Ambulatory Visit: Payer: Self-pay | Admitting: Internal Medicine

## 2012-06-05 ENCOUNTER — Emergency Department (HOSPITAL_COMMUNITY): Payer: Medicare Other

## 2012-06-05 ENCOUNTER — Emergency Department (HOSPITAL_COMMUNITY)
Admission: EM | Admit: 2012-06-05 | Discharge: 2012-06-05 | Disposition: A | Discharge status: Home / Self Care | Payer: Medicare Other | Attending: Emergency Medicine | Admitting: Emergency Medicine

## 2012-06-05 ENCOUNTER — Encounter (HOSPITAL_COMMUNITY): Payer: Self-pay | Admitting: Emergency Medicine

## 2012-06-05 DIAGNOSIS — S0990XA Unspecified injury of head, initial encounter: Secondary | ICD-10-CM | POA: Insufficient documentation

## 2012-06-05 DIAGNOSIS — S46909A Unspecified injury of unspecified muscle, fascia and tendon at shoulder and upper arm level, unspecified arm, initial encounter: Secondary | ICD-10-CM | POA: Insufficient documentation

## 2012-06-05 DIAGNOSIS — W19XXXA Unspecified fall, initial encounter: Secondary | ICD-10-CM

## 2012-06-05 DIAGNOSIS — Z8679 Personal history of other diseases of the circulatory system: Secondary | ICD-10-CM | POA: Insufficient documentation

## 2012-06-05 DIAGNOSIS — G40209 Localization-related (focal) (partial) symptomatic epilepsy and epileptic syndromes with complex partial seizures, not intractable, without status epilepticus: Secondary | ICD-10-CM | POA: Insufficient documentation

## 2012-06-05 DIAGNOSIS — I10 Essential (primary) hypertension: Secondary | ICD-10-CM | POA: Insufficient documentation

## 2012-06-05 DIAGNOSIS — W010XXA Fall on same level from slipping, tripping and stumbling without subsequent striking against object, initial encounter: Secondary | ICD-10-CM | POA: Insufficient documentation

## 2012-06-05 DIAGNOSIS — Z8719 Personal history of other diseases of the digestive system: Secondary | ICD-10-CM | POA: Insufficient documentation

## 2012-06-05 DIAGNOSIS — Z85118 Personal history of other malignant neoplasm of bronchus and lung: Secondary | ICD-10-CM | POA: Insufficient documentation

## 2012-06-05 DIAGNOSIS — S4980XA Other specified injuries of shoulder and upper arm, unspecified arm, initial encounter: Secondary | ICD-10-CM | POA: Insufficient documentation

## 2012-06-05 DIAGNOSIS — J4489 Other specified chronic obstructive pulmonary disease: Secondary | ICD-10-CM | POA: Insufficient documentation

## 2012-06-05 DIAGNOSIS — H9190 Unspecified hearing loss, unspecified ear: Secondary | ICD-10-CM | POA: Insufficient documentation

## 2012-06-05 DIAGNOSIS — Z7901 Long term (current) use of anticoagulants: Secondary | ICD-10-CM | POA: Insufficient documentation

## 2012-06-05 DIAGNOSIS — F17200 Nicotine dependence, unspecified, uncomplicated: Secondary | ICD-10-CM | POA: Insufficient documentation

## 2012-06-05 DIAGNOSIS — Y929 Unspecified place or not applicable: Secondary | ICD-10-CM | POA: Insufficient documentation

## 2012-06-05 DIAGNOSIS — Z8709 Personal history of other diseases of the respiratory system: Secondary | ICD-10-CM | POA: Insufficient documentation

## 2012-06-05 DIAGNOSIS — Y9389 Activity, other specified: Secondary | ICD-10-CM | POA: Insufficient documentation

## 2012-06-05 DIAGNOSIS — Z79899 Other long term (current) drug therapy: Secondary | ICD-10-CM | POA: Insufficient documentation

## 2012-06-05 DIAGNOSIS — J449 Chronic obstructive pulmonary disease, unspecified: Secondary | ICD-10-CM | POA: Insufficient documentation

## 2012-06-05 NOTE — ED Notes (Addendum)
Patient fell in [place] yesterday about 3pm. Patient denies passing out when his head bounced off of floor. Denies headache.No double vision. Patient is sore on right arm, hand, shoulder, neck. Patient states that  He has fallen before. Instructed to stay in the bed-side rails up.

## 2012-06-05 NOTE — ED Notes (Signed)
Per pt, slipped at [facility] and fell on right side-states he hit right side of head, no LOC-skin tear on right elbow, pt currently on coumadin

## 2012-06-05 NOTE — ED Provider Notes (Signed)
History     CSN: [id number]  Arrival date & time 06/05/12  9629   First MD Initiated Contact with Patient 06/05/12 0840      Chief Complaint  Patient presents with  . Fall    (Consider location/radiation/quality/duration/timing/severity/associated sxs/prior treatment) Patient is a 79 y.o. male presenting with fall. The history is provided by the patient.  Fall The accident occurred less than 1 hour ago.  pt slipped and fell at [facility]--no loc--struck the left side of head and left arm--wound bandaged and bleeding controlled--denies headache, neck pain, chest pain, abd pain, or sob--no hip or back pain  Past Medical History  Diagnosis Date  . Hypertension   . COPD (chronic obstructive pulmonary disease)   . Hemorrhoids     external  . 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 GERD (gastroesophageal reflux disease)   . Emphysema   . Non-small cell carcinoma of lung dx'd 2006    stage 3  . Lung cancer   . HOH (hard of hearing)     Past Surgical History  Procedure Laterality Date  . Inner ear surgery      No family history on file.  History  Substance Use Topics  . Smoking status: Current Every Day Smoker -- 1.50 packs/day  . Smokeless tobacco: Not on file  . Alcohol Use: No      Review of Systems  All other systems reviewed and are negative.    Allergies  Review of patient's allergies indicates no known allergies.  Home Medications   Current Outpatient Rx  Name  Route  Sig  Dispense  Refill  . HYDROcodone-acetaminophen (NORCO/VICODIN) 5-325 MG per tablet   Oral   Take 1 tablet by mouth every 4 (four) hours as needed for pain.   20 tablet   0   . oxycodone-acetaminophen (PERCOCET) 2.5-325 MG per tablet   Oral   Take 1 tablet by mouth every 6 (six) hours as needed for pain.   30 tablet   0   . EXPIRED: phenytoin (DILANTIN) 100 MG ER capsule   Oral   Take 3 capsules (300 mg total) by mouth daily.   90 capsule   10   .  sulfamethoxazole-trimethoprim (SEPTRA DS) 800-160 MG per tablet   Oral   Take 1 tablet by mouth every 12 (twelve) hours.   20 tablet   0   . sulfamethoxazole-trimethoprim (SEPTRA DS) 800-160 MG per tablet   Oral   Take 1 tablet by mouth every 12 (twelve) hours.   14 tablet   0   . warfarin (COUMADIN) 5 MG tablet   Oral   Take 5 mg by mouth daily.           BP 134/61  Pulse 64  Temp(Src) 98.4 F (36.9 C) (Oral)  Resp 18  SpO2 94%  Physical Exam  Nursing note and vitals reviewed. Constitutional: He is oriented to person, place, and time. He appears well-developed and well-nourished.  Non-toxic appearance. No distress.  HENT:  Head: Normocephalic and atraumatic.  Eyes: Conjunctivae, EOM and lids are normal. Pupils are equal, round, and reactive to light.  Neck: Normal range of motion. Neck supple. No tracheal deviation present. No mass present.  Cardiovascular: Normal rate, regular rhythm and normal heart sounds.  Exam reveals no gallop.   No murmur heard. Pulmonary/Chest: Effort normal and breath sounds normal. No stridor. No respiratory distress. He has no decreased breath sounds. He has no wheezes. He  has no rhonchi. He has no rales.  Abdominal: Soft. Normal appearance and bowel sounds are normal. He exhibits no distension. There is no tenderness. There is no rebound and no CVA tenderness.  Musculoskeletal: Normal range of motion. He exhibits no edema and no tenderness.       Arms: Neurological: He is alert and oriented to person, place, and time. He has normal strength. No cranial nerve deficit or sensory deficit. GCS eye subscore is 4. GCS verbal subscore is 5. GCS motor subscore is 6.  Skin: Skin is warm and dry. No abrasion and no rash noted.  Psychiatric: He has a normal mood and affect. His speech is normal and behavior is normal.    ED Course  Procedures (including critical care time)  Labs Reviewed - No data to display No results found.   No diagnosis  found.    MDM  Pt with neg head ct--stable for d/c        Toy Baker, MD 06/05/12 [phone]

## 2012-07-10 ENCOUNTER — Telehealth: Payer: Self-pay | Admitting: *Deleted

## 2012-07-10 ENCOUNTER — Other Ambulatory Visit: Payer: Self-pay | Admitting: Internal Medicine

## 2012-07-10 NOTE — Telephone Encounter (Signed)
i have tried to call pt and his contact #, i get no answer at both and no vmail where i can leave a message, i will continue trying to reach pt

## 2012-07-10 NOTE — Telephone Encounter (Signed)
Scheduled appt for wed 6/25 dr ziemer

## 2012-07-10 NOTE — Telephone Encounter (Signed)
I refilled for 1 month supply

## 2012-07-10 NOTE — Telephone Encounter (Signed)
It appears that patient was seen here in April 2013 by Dr. Scot Dock; please find out if he has a PCP who is managing his Dilantin.

## 2012-07-15 ENCOUNTER — Ambulatory Visit (INDEPENDENT_AMBULATORY_CARE_PROVIDER_SITE_OTHER): Payer: Medicare Other | Admitting: Internal Medicine

## 2012-07-15 ENCOUNTER — Encounter: Payer: Self-pay | Admitting: Internal Medicine

## 2012-07-15 VITALS — BP 114/68 | HR 86 | Temp 96.8°F | Ht 70.0 in | Wt 151.4 lb

## 2012-07-15 DIAGNOSIS — H938X9 Other specified disorders of ear, unspecified ear: Secondary | ICD-10-CM

## 2012-07-15 DIAGNOSIS — R569 Unspecified convulsions: Secondary | ICD-10-CM

## 2012-07-15 DIAGNOSIS — F17200 Nicotine dependence, unspecified, uncomplicated: Secondary | ICD-10-CM

## 2012-07-15 DIAGNOSIS — H938X1 Other specified disorders of right ear: Secondary | ICD-10-CM

## 2012-07-15 DIAGNOSIS — Z9181 History of falling: Secondary | ICD-10-CM

## 2012-07-15 DIAGNOSIS — I10 Essential (primary) hypertension: Secondary | ICD-10-CM

## 2012-07-15 DIAGNOSIS — W19XXXA Unspecified fall, initial encounter: Secondary | ICD-10-CM | POA: Insufficient documentation

## 2012-07-15 DIAGNOSIS — R296 Repeated falls: Secondary | ICD-10-CM

## 2012-07-15 DIAGNOSIS — I82409 Acute embolism and thrombosis of unspecified deep veins of unspecified lower extremity: Secondary | ICD-10-CM

## 2012-07-15 MED ORDER — PHENYTOIN SODIUM EXTENDED 100 MG PO CAPS: ORAL_CAPSULE | ORAL | Status: DC

## 2012-07-15 NOTE — Patient Instructions (Signed)
General Instructions: Your meds have been sent into [facility].    Progress Toward Treatment Goals:  Treatment Goal 07/15/2012  Blood pressure at goal  Stop smoking smoking the same amount  Prevent falls unchanged    Self Care Goals & Plans:  Self Care Goal 07/15/2012  Manage my medications bring my medications to every visit; take my medicines as prescribed  Eat healthy foods eat foods that are low in salt; eat more vegetables  Stop smoking call [facility] (1-800-QUIT-NOW)  Prevent falls use home fall prevention checklist to improve safety  Meeting treatment goals maintain the current self-care plan       Care Management & Community Referrals:

## 2012-07-15 NOTE — Assessment & Plan Note (Signed)
Continues to smoke with no plans or desire to quit, even in light of lung cancer dx and recurrence.

## 2012-07-15 NOTE — Assessment & Plan Note (Signed)
Stable on Dilantin 300 mg extended release for the past 20 years with no evidence of seizures. Not checking Dilantin levels. Refill for one year, instructed patient to call clinic for future refills as he does not want to come back in.

## 2012-07-15 NOTE — Assessment & Plan Note (Signed)
On coumadin Rxed by cancer center (dr. Arbutus Ped). Pt says gets INR followed there. NO reports of active bleeding/dark stools

## 2012-07-15 NOTE — Progress Notes (Signed)
Patient ID: Paul [name], male   DOB: 1930-12-28, 79 y.o.   MRN: [id number]  Subjective:   Patient ID: Paul [name] male   DOB: 24-Jul-1930 79 y.o.   MRN: [id number]  HPI: Mr.Paul [name] is a 79 y.o. male with history of stage III NSCLC, DVT and PE on chronic Coumadin, seizure d/o, COPD, tobacco abuse presenting for RF of dilantin. Has been on 300mg  extended release Dilantin for the past 20 years and has been seizure-free on this dose. However, electronic refills can only be provided through one year for this medication.  He has had some recent complaints of intermittent dizziness and a couple fall several months ago. He says that sometimes he feels unsteady and has to sit down. No loss of consciousness. No chest pain, palpitations. Can happen when sitting or standing. Upon further discussion of his symptoms, he says he feels safe at home and refuses any further workup or referrals for dizziness. He says that he has lung cancer and just wants to live out the life he has. In terms of his lung carcinoma, he follows with Dr. Gwenyth Bouillon. He is currently being observed for lung cancer and taking Coumadin which he gets from the cancer Center. 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 He was seen by dermatologist who recommended excision, which he declined.    Past Medical History  Diagnosis Date  . Hypertension   . COPD (chronic obstructive pulmonary disease)   . Hemorrhoids     external  . 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 GERD (gastroesophageal reflux disease)   . Emphysema   . Non-small cell carcinoma of lung dx'd 2006    stage 3  . Lung cancer   . HOH (hard of hearing)    Current Outpatient Prescriptions  Medication Sig Dispense Refill  . phenytoin (DILANTIN) 100 MG ER capsule TAKE 3 CAPSULES BY MOUTH EVERY DAY  90 capsule  11  . sodium chloride (MURO 128) 5 % ophthalmic solution Place 1 drop into both eyes as needed (dry eyes).      . warfarin  (COUMADIN) 5 MG tablet Take 5 mg by mouth daily.       No current facility-administered medications for this visit.   No family history on file. History   Social History  . Marital Status: Divorced    Spouse Name: N/A    Number of Children: N/A  . Years of Education: N/A   Social History Main Topics  . Smoking status: Current Every Day Smoker -- 2.00 packs/day  . Smokeless tobacco: None  . Alcohol Use: No  . Drug Use: No  . Sexually Active: None   Other Topics Concern  . None   Social History Narrative  . None   Review of Systems: 10 pt ROS performed, pertinent positives and negatives noted in HPI Objective:  Physical Exam: Filed Vitals:   07/15/12 0822 07/15/12 0829 07/15/12 0831 07/15/12 0833  BP: 121/71 129/77 125/68 114/68  Pulse: 84 82 79 86  Temp: 96.8 F (36 C)     TempSrc: Oral     Height: 5\' 10"  (1.778 m)     Weight: 151 lb 6.4 oz (68.675 kg)     SpO2: 97%      Vitals reviewed. General: thin male, hard of hearing, alert and oriented, NAD HEENT: PERRL, EOMI, no scleral icterus Ear: Blackened circumferential eschar in R EAC Cardiac: RRR, no rubs,  murmurs or gallops Pulm: good air movement all lung fields, some scattered rhonchi Abd: soft, nontender, nondistended, BS present Neuro: alert and oriented X3, cranial nerves II-XII grossly intact Skin: scattered actinic changes, pink, rough scaly papules over arms  . Assessment & Plan:   Please see problem-based charting for assessment and plan.

## 2012-07-15 NOTE — Assessment & Plan Note (Signed)
Patient reports episodes of dizziness and falls, the last fall was 1-2 months ago. Denies any loss of consciousness. Denies any gait abnormality. Denies any visual change or headache. No chest pain or palpitations. He refuses my recommendation for evaluation by neuro rehabilitation for his frequent falls and dizziness. He says "I have lung cancer, and I just want to live out my life." Told him if he ever has loss of consciousness, chest pain, palpitations he should go to the emergency room. He is in agreement.

## 2012-07-15 NOTE — Assessment & Plan Note (Signed)
Patient has circumferential black eschar in his right external auditory canal. Reports that he was told by dermatologist and this is malignancy and will need surgical intervention, which he refused. Recommended that he followup with his dermatologist to discuss non-surgical options of treatment.

## 2012-07-15 NOTE — Assessment & Plan Note (Signed)
BP Readings from Last 3 Encounters:  07/15/12 114/68  06/05/12 134/61  04/16/12 123/71    Lab Results  Component Value Date   NA 136 04/16/2012   K 4.6 04/16/2012   CREATININE 0.8 04/16/2012    Assessment: Blood pressure control: controlled Progress toward BP goal:  at goal Comments: not on any meds  Plan: Medications:  not on any meds Educational resources provided: brochure Self management tools provided: home blood pressure logbook

## 2012-07-22 NOTE — Progress Notes (Signed)
Case discussed with Dr. Ziemer at the time of the visit.  We reviewed the resident's history and exam and pertinent patient test results.  I agree with the assessment, diagnosis, and plan of care documented in the resident's note.  

## 2012-09-24 ENCOUNTER — Other Ambulatory Visit: Payer: Self-pay | Admitting: Medical Oncology

## 2012-09-24 DIAGNOSIS — C3490 Malignant neoplasm of unspecified part of unspecified bronchus or lung: Secondary | ICD-10-CM

## 2012-09-27 ENCOUNTER — Telehealth: Payer: Self-pay | Admitting: Internal Medicine

## 2012-09-27 NOTE — Telephone Encounter (Signed)
Not able to reach pt re 9/24 lb or lm. Schedule mailed.

## 2012-10-14 ENCOUNTER — Other Ambulatory Visit (HOSPITAL_BASED_OUTPATIENT_CLINIC_OR_DEPARTMENT_OTHER): Payer: Medicare Other | Admitting: Lab

## 2012-10-14 ENCOUNTER — Encounter (HOSPITAL_COMMUNITY): Payer: Self-pay

## 2012-10-14 ENCOUNTER — Ambulatory Visit (HOSPITAL_COMMUNITY)
Admission: RE | Admit: 2012-10-14 | Discharge: 2012-10-14 | Disposition: A | Discharge status: Home / Self Care | Payer: Medicare Other | Source: Ambulatory Visit | Attending: Internal Medicine | Admitting: Internal Medicine

## 2012-10-14 DIAGNOSIS — I82409 Acute embolism and thrombosis of unspecified deep veins of unspecified lower extremity: Secondary | ICD-10-CM

## 2012-10-14 DIAGNOSIS — I2510 Atherosclerotic heart disease of native coronary artery without angina pectoris: Secondary | ICD-10-CM | POA: Insufficient documentation

## 2012-10-14 DIAGNOSIS — C3490 Malignant neoplasm of unspecified part of unspecified bronchus or lung: Secondary | ICD-10-CM

## 2012-10-14 DIAGNOSIS — K449 Diaphragmatic hernia without obstruction or gangrene: Secondary | ICD-10-CM | POA: Insufficient documentation

## 2012-10-14 DIAGNOSIS — J439 Emphysema, unspecified: Secondary | ICD-10-CM | POA: Insufficient documentation

## 2012-10-14 DIAGNOSIS — J479 Bronchiectasis, uncomplicated: Secondary | ICD-10-CM | POA: Insufficient documentation

## 2012-10-14 DIAGNOSIS — Z9221 Personal history of antineoplastic chemotherapy: Secondary | ICD-10-CM | POA: Insufficient documentation

## 2012-10-14 DIAGNOSIS — R911 Solitary pulmonary nodule: Secondary | ICD-10-CM | POA: Insufficient documentation

## 2012-10-14 LAB — CBC WITH DIFFERENTIAL/PLATELET
BASO%: 0.7 % (ref 0.0–2.0)
EOS%: 2.5 % (ref 0.0–7.0)
LYMPH%: 10.4 % — ABNORMAL LOW (ref 14.0–49.0)
MCHC: 34.7 g/dL (ref 32.0–36.0)
MCV: 87.4 fL (ref 79.3–98.0)
MONO#: 0.7 10*3/uL (ref 0.1–0.9)
MONO%: 9.7 % (ref 0.0–14.0)
Platelets: 237 10*3/uL (ref 140–400)
RBC: 4.88 10*6/uL (ref 4.20–5.82)
WBC: 7.6 10*3/uL (ref 4.0–10.3)

## 2012-10-14 LAB — COMPREHENSIVE METABOLIC PANEL (CC13)
Alkaline Phosphatase: 126 U/L (ref 40–150)
BUN: 7.6 mg/dL (ref 7.0–26.0)
Creatinine: 0.7 mg/dL (ref 0.7–1.3)
Glucose: 131 mg/dl (ref 70–140)
Total Bilirubin: 0.3 mg/dL (ref 0.20–1.20)

## 2012-10-14 LAB — PROTIME-INR: Protime: 13.2 Seconds (ref 10.6–13.4)

## 2012-10-14 MED ORDER — IOHEXOL 300 MG/ML  SOLN
80.0000 mL | Freq: Once | INTRAMUSCULAR | Status: AC | PRN
  Administered 2012-10-14: 80 mL via INTRAVENOUS

## 2012-10-20 ENCOUNTER — Other Ambulatory Visit: Payer: Self-pay | Admitting: Medical Oncology

## 2012-10-22 ENCOUNTER — Telehealth: Payer: Self-pay | Admitting: Internal Medicine

## 2012-10-22 NOTE — Telephone Encounter (Signed)
, °

## 2012-10-27 ENCOUNTER — Ambulatory Visit (HOSPITAL_BASED_OUTPATIENT_CLINIC_OR_DEPARTMENT_OTHER): Payer: Medicare Other | Admitting: Internal Medicine

## 2012-10-27 ENCOUNTER — Telehealth: Payer: Self-pay | Admitting: Internal Medicine

## 2012-10-27 ENCOUNTER — Encounter: Payer: Self-pay | Admitting: Internal Medicine

## 2012-10-27 VITALS — BP 112/70 | HR 86 | Temp 96.7°F | Resp 18 | Ht 70.0 in | Wt 147.8 lb

## 2012-10-27 DIAGNOSIS — C3410 Malignant neoplasm of upper lobe, unspecified bronchus or lung: Secondary | ICD-10-CM

## 2012-10-27 DIAGNOSIS — C3490 Malignant neoplasm of unspecified part of unspecified bronchus or lung: Secondary | ICD-10-CM

## 2012-10-27 DIAGNOSIS — F17200 Nicotine dependence, unspecified, uncomplicated: Secondary | ICD-10-CM

## 2012-10-27 DIAGNOSIS — Z86718 Personal history of other venous thrombosis and embolism: Secondary | ICD-10-CM

## 2012-10-27 NOTE — Patient Instructions (Signed)
Followup visit in 2 months with repeat CT scan of the chest. 

## 2012-10-27 NOTE — Telephone Encounter (Signed)
GAve pt appt for lab and md for December 2014

## 2012-10-27 NOTE — Progress Notes (Signed)
[facility] [phone]   Fax:(336) [phone]  OFFICE PROGRESS NOTE  Paul Bruns, MD [facility] [address] [place] [address]  PRINCIPAL DIAGNOSES:  1. Stage IIIA non-small cell lung cancer diagnosed in November 2005 with disease recurrence in February 2007. 2. History of pulmonary emboli diagnosed incidentally on CT scan of the chest in May 2006. 3. Right lower extremity deep venous thrombosis diagnosed in August 2007 and the patient was off anticoagulation at that time. PRIOR THERAPY:  1. Status post concurrent chemoradiation with weekly carboplatin and paclitaxel. Last dose was given February 07, 2004. 2. Status post 3 cycles of consolidation chemotherapy with docetaxel. Last dose was given April 04, 2004. 3. Status post 12 cycles of single-agent Alimta for disease recurrence. Last dose was given December 26, 2005 and the patient has disease stabilization since that time. CURRENT THERAPY.:  1. Observation for lung cancer. 2. Coumadin 2.5 mg p.o. daily except Monday and Thursday the patient on 5 mg for history of deep venous thrombosis and pulmonary emboli.   INTERVAL HISTORY: Paul [name] 79 y.o. male returns to the clinic today for routine six-month followup visit. The patient continues to have shortness breath with exertion as well as cough productive of yellowish sputum. He denied having any significant fever or chills. He denied having any chest pain or hemoptysis. The patient lost around 8 pounds since his last visit. He denied having any other significant complaints today. He had repeat CT scan of the chest performed recently and he is here for evaluation and discussion of his scan results. Unfortunately the patient continues to smoke one and a half pack of cigarettes every day. I strongly advise him to quit smoking and offered him smoke cessation programs in the past and the patient and is unwilling to quit smoking.  MEDICAL  HISTORY: Past Medical History  Diagnosis Date  . Hypertension   . COPD (chronic obstructive pulmonary disease)   . Hemorrhoids     external  . 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 GERD (gastroesophageal reflux disease)   . Emphysema   . Non-small cell carcinoma of lung dx'd 2006    stage 3  . Lung cancer   . HOH (hard of hearing)     ALLERGIES:  has No Known Allergies.  MEDICATIONS:  Current Outpatient Prescriptions  Medication Sig Dispense Refill  . phenytoin (DILANTIN) 100 MG ER capsule TAKE 3 CAPSULES BY MOUTH EVERY DAY  90 capsule  11  . sodium chloride (MURO 128) 5 % ophthalmic solution Place 1 drop into both eyes as needed (dry eyes).      . warfarin (COUMADIN) 5 MG tablet Take 5 mg by mouth daily.       No current facility-administered medications for this visit.    SURGICAL HISTORY:  Past Surgical History  Procedure Laterality Date  . Inner ear surgery      REVIEW OF SYSTEMS:  A comprehensive review of systems was negative except for: Constitutional: positive for fatigue Respiratory: positive for cough, dyspnea on exertion and sputum   PHYSICAL EXAMINATION: General appearance: alert, cooperative and no distress Head: Normocephalic, without obvious abnormality, atraumatic Neck: no adenopathy, no JVD, supple, symmetrical, trachea midline and thyroid not enlarged, symmetric, no tenderness/mass/nodules Lymph nodes: Cervical, supraclavicular, and axillary nodes normal. Resp: clear to auscultation bilaterally Back: symmetric, no curvature. ROM normal. No CVA tenderness. Cardio: regular rate and rhythm, S1, S2 normal,  no murmur, click, rub or gallop GI: soft, non-tender; bowel sounds normal; no masses,  no organomegaly Extremities: extremities normal, atraumatic, no cyanosis or edema  ECOG PERFORMANCE STATUS: 1 - Symptomatic but completely ambulatory  Blood pressure 112/70, pulse 86, temperature 96.7 F (35.9 C), temperature source Oral, resp. rate  18, height 5\' 10"  (1.778 m), weight 147 lb 12.8 oz (67.042 kg).  LABORATORY DATA: Lab Results  Component Value Date   WBC 7.6 10/14/2012   HGB 14.8 10/14/2012   HCT 42.7 10/14/2012   MCV 87.4 10/14/2012   PLT 237 10/14/2012      Chemistry      Component Value Date/Time   NA 136 10/14/2012 0857   NA 137 01/16/2012 0213   NA 142 02/15/2011 0956   K 4.3 10/14/2012 0857   K 4.1 01/16/2012 0213   K 4.4 02/15/2011 0956   CL 100 04/16/2012 0832   CL 104 01/16/2012 0213   CL 99 02/15/2011 0956   CO2 27 10/14/2012 0857   CO2 26 01/16/2012 0213   CO2 29 02/15/2011 0956   BUN 7.6 10/14/2012 0857   BUN 11 01/16/2012 0213   BUN 8 02/15/2011 0956   CREATININE 0.7 10/14/2012 0857   CREATININE 0.67 01/16/2012 0213   CREATININE 0.5* 02/15/2011 0956      Component Value Date/Time   CALCIUM 8.9 10/14/2012 0857   CALCIUM 8.6 01/16/2012 0213   CALCIUM 8.8 02/15/2011 0956   ALKPHOS 126 10/14/2012 0857   ALKPHOS 109* 02/15/2011 0956   ALKPHOS 123* 10/30/2009 0901   AST 10 10/14/2012 0857   AST 18 02/15/2011 0956   AST 18 10/30/2009 0901   ALT 9 10/14/2012 0857   ALT 19 02/15/2011 0956   ALT 16 10/30/2009 0901   BILITOT 0.30 10/14/2012 0857   BILITOT 0.50 02/15/2011 0956   BILITOT 0.7 10/30/2009 0901       RADIOGRAPHIC STUDIES: Ct Chest W Contrast  10/14/2012   *RADIOLOGY REPORT*  Clinical Data: Lung cancer and shortness of breath.  Chemotherapy complete.  CT CHEST WITH CONTRAST  Technique:  Multidetector CT imaging of the chest was performed following the standard protocol during bolus administration of intravenous contrast.  Contrast: 80mL OMNIPAQUE IOHEXOL 300 MG/ML  SOLN  Comparison: 04/16/2012.  Findings: Right supraclavicular lymph nodes are sub centimeter in short axis size.  No pathologically enlarged mediastinal, left hilar or axillary lymph nodes.  Coronary artery calcification. Heart size normal.  No pericardial effusion.  Moderate hiatal hernia.  Emphysema with scattered bullous changes bilaterally.   Scarring in the apical segment right upper lobe.  Consolidation, volume loss and bronchiectasis are again seen in the posterior segment right upper lobe, extending to the right hilum.  Fissural nodularity in the adjacent right lung measures up to 6 x 10 mm (image 24), unchanged.  There is a new irregular nodular density in the posterior right lower lobe, measuring 1.8 x 1.3 cm (image 27).  Subpleural nodularity in the posteromedial mid left hemithorax measures up to 5 x 9 mm (image 35), unchanged.  No pleural fluid. Obstruction of the right upper lobe bronchus with narrowing of the right middle lobe bronchus, as before.  Minimal dependent debris in the trachea.  Airway is otherwise unremarkable.  Incidental imaging of the upper abdomen shows no acute findings. No worrisome lytic or sclerotic lesions.  Degenerative changes are seen in the spine.  Question old distal right clavicle fracture.  IMPRESSION:  1.  New irregular nodular density in the right lower  lobe.  Follow- up could be performed in 6 weeks to 3 months in further evaluation, as recurrent malignancy is a possibility. 2.  Post-treatment changes in the right perihilar region and right upper lobe with stable fissural nodularity. 3.  Bullous emphysema.   Original Report Authenticated By: Leanna Battles, M.D.    ASSESSMENT AND PLAN: This is a very pleasant 79 years old white male with history of stage IIIa non-small cell lung cancer status post several treatment in the past including concurrent chemoradiation followed by consolidation chemotherapy as well as treatment with single agent Alimta but has been on observation since December of 2007. His recent scan showed new lung nodule in the right lower lobe. I discussed the scan results with the patient today. I recommended for him to have repeat CT scan of the chest with contrast in 2 months for evaluation of this lesion. I strongly encouraged him again to quit smoking. The patient was advised to call  immediately if he has any concerning symptoms in the interval. The patient voices understanding of current disease status and treatment options and is in agreement with the current care plan.  All questions were answered. The patient knows to call the clinic with any problems, questions or concerns. We can certainly see the patient much sooner if necessary.

## 2012-11-15 ENCOUNTER — Other Ambulatory Visit: Payer: Self-pay | Admitting: Internal Medicine

## 2012-11-25 ENCOUNTER — Telehealth: Payer: Self-pay | Admitting: *Deleted

## 2012-11-25 NOTE — Telephone Encounter (Signed)
Pt called requesting something to help with his congestion and cold symptoms.  Per dr Donnald Garre, pt needs to see his PCP or go to urgent care.  Pt stated he "does not understand why dr Donnald Garre cannot just do his job" and hung up the phone.  SLJ

## 2012-11-26 ENCOUNTER — Emergency Department (INDEPENDENT_AMBULATORY_CARE_PROVIDER_SITE_OTHER)
Admission: EM | Admit: 2012-11-26 | Discharge: 2012-11-26 | Disposition: A | Discharge status: Home / Self Care | Payer: Medicare Other | Source: Home / Self Care | Attending: Family Medicine | Admitting: Family Medicine

## 2012-11-26 ENCOUNTER — Encounter (HOSPITAL_COMMUNITY): Payer: Self-pay | Admitting: Emergency Medicine

## 2012-11-26 DIAGNOSIS — J449 Chronic obstructive pulmonary disease, unspecified: Secondary | ICD-10-CM

## 2012-11-26 MED ORDER — IPRATROPIUM BROMIDE 0.02 % IN SOLN
RESPIRATORY_TRACT | Status: AC
  Filled 2012-11-26: qty 2.5

## 2012-11-26 MED ORDER — IPRATROPIUM BROMIDE 0.02 % IN SOLN
0.5000 mg | Freq: Once | RESPIRATORY_TRACT | Status: AC
  Administered 2012-11-26: 0.5 mg via RESPIRATORY_TRACT

## 2012-11-26 MED ORDER — ALBUTEROL SULFATE HFA 108 (90 BASE) MCG/ACT IN AERS: 2.0000 | INHALATION_SPRAY | Freq: Four times a day (QID) | RESPIRATORY_TRACT | Status: DC | PRN

## 2012-11-26 MED ORDER — PREDNISONE 50 MG PO TABS: 50.0000 mg | ORAL_TABLET | Freq: Every day | ORAL | Status: DC

## 2012-11-26 MED ORDER — ALBUTEROL SULFATE (5 MG/ML) 0.5% IN NEBU
INHALATION_SOLUTION | RESPIRATORY_TRACT | Status: AC
  Filled 2012-11-26: qty 1

## 2012-11-26 MED ORDER — ALBUTEROL SULFATE (5 MG/ML) 0.5% IN NEBU
5.0000 mg | INHALATION_SOLUTION | Freq: Once | RESPIRATORY_TRACT | Status: AC
  Administered 2012-11-26: 5 mg via RESPIRATORY_TRACT

## 2012-11-26 MED ORDER — DOXYCYCLINE HYCLATE 100 MG PO CAPS: 100.0000 mg | ORAL_CAPSULE | Freq: Two times a day (BID) | ORAL | Status: DC

## 2012-11-26 NOTE — ED Provider Notes (Signed)
Paul [name] is a 79 y.o. male who presents to Urgent Care today for shortness of breath cough with increased sputum production and wheezing. This is been present for about one day. He denies any fevers or chills nausea vomiting or diarrhea. He has been diagnosed with COPD in the past but does not have an albuterol inhaler. His past medical history significant for a non-small cell lung cancer that is currently being managed with oncology, and COPD and emphysema. He does not have a primary care provider.   Past Medical History  Diagnosis Date  . Hypertension   . COPD (chronic obstructive pulmonary disease)   . Hemorrhoids     external  . 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 GERD (gastroesophageal reflux disease)   . Emphysema   . Non-small cell carcinoma of lung dx'd 2006    stage 3  . Lung cancer   . HOH (hard of hearing)    History  Substance Use Topics  . Smoking status: Current Every Day Smoker -- 2.00 packs/day  . Smokeless tobacco: Not on file  . Alcohol Use: No   ROS as above Medications reviewed. No current facility-administered medications for this encounter.   Current Outpatient Prescriptions  Medication Sig Dispense Refill  . phenytoin (DILANTIN) 100 MG ER capsule TAKE 3 CAPSULES BY MOUTH EVERY DAY  90 capsule  11  . sodium chloride (MURO 128) 5 % ophthalmic solution Place 1 drop into both eyes as needed (dry eyes).      . warfarin (COUMADIN) 5 MG tablet Take 5 mg by mouth daily.      Marland Kitchen albuterol (PROVENTIL HFA;VENTOLIN HFA) 108 (90 BASE) MCG/ACT inhaler Inhale 2 puffs into the lungs every 6 (six) hours as needed for wheezing or shortness of breath.  1 Inhaler  2  . doxycycline (VIBRAMYCIN) 100 MG capsule Take 1 capsule (100 mg total) by mouth 2 (two) times daily.  14 capsule  0  . predniSONE (DELTASONE) 50 MG tablet Take 1 tablet (50 mg total) by mouth daily.  5 tablet  0  . warfarin (COUMADIN) 5 MG tablet TAKE 1 TABLET BY MOUTH EVERY DAY  30 tablet  3     Exam:  BP 138/78  Pulse 90  Temp(Src) 98.3 F (36.8 C) (Oral)  Resp 18  SpO2 97% Gen: Well NAD HEENT: EOMI,  MMM Lungs: Slight increased worker breathing with prolonged expiratory phase and wheezing bilaterally. Heart: RRR no MRG Abd: NABS, NT, ND Exts: Non edematous BL  LE, warm and well perfused.   Patient was given DuoNeb nebulizer treatment and had considerable improvement subjectively. Objectively his worker breathing resolved as did his wheezing. He continued to have some prolonged expiratory phase as well.  No results found for this or any previous visit (from the past 24 hour(s)). No results found.  Assessment and Plan: 79 y.o. male with  COPD exacerbation.  Plan to treat with prednisone, and albuterol. Additionally use doxycycline antibiotics to reduce chance of re exacerbation.  Recommend patient establish with a primary care provider soon.  Discussed warning signs or symptoms. Please see discharge instructions. Patient expresses understanding.      Rodolph Bong, MD 11/26/12 1041

## 2012-11-26 NOTE — ED Notes (Signed)
Pt reports a week of shortness of breath, productive cough without a fever   He thinks he has a virus that is going around.

## 2012-12-25 ENCOUNTER — Encounter (HOSPITAL_COMMUNITY): Payer: Self-pay

## 2012-12-25 ENCOUNTER — Other Ambulatory Visit: Payer: Medicare Other | Admitting: Lab

## 2012-12-25 ENCOUNTER — Ambulatory Visit (HOSPITAL_COMMUNITY)
Admission: RE | Admit: 2012-12-25 | Discharge: 2012-12-25 | Disposition: A | Discharge status: Home / Self Care | Payer: Medicare Other | Source: Ambulatory Visit | Attending: Internal Medicine | Admitting: Internal Medicine

## 2012-12-25 DIAGNOSIS — C3490 Malignant neoplasm of unspecified part of unspecified bronchus or lung: Secondary | ICD-10-CM

## 2012-12-25 DIAGNOSIS — J438 Other emphysema: Secondary | ICD-10-CM | POA: Insufficient documentation

## 2012-12-25 DIAGNOSIS — K449 Diaphragmatic hernia without obstruction or gangrene: Secondary | ICD-10-CM | POA: Insufficient documentation

## 2012-12-25 LAB — CBC WITH DIFFERENTIAL/PLATELET
BASO%: 0.8 % (ref 0.0–2.0)
Basophils Absolute: 0 10*3/uL (ref 0.0–0.1)
EOS%: 4.9 % (ref 0.0–7.0)
HGB: 15.2 g/dL (ref 13.0–17.1)
MCH: 29.9 pg (ref 27.2–33.4)
MCHC: 33.2 g/dL (ref 32.0–36.0)
MCV: 90.1 fL (ref 79.3–98.0)
MONO#: 0.7 10*3/uL (ref 0.1–0.9)
MONO%: 12.1 % (ref 0.0–14.0)
NEUT#: 3.7 10*3/uL (ref 1.5–6.5)
RBC: 5.08 10*6/uL (ref 4.20–5.82)
RDW: 14.3 % (ref 11.0–14.6)

## 2012-12-25 LAB — COMPREHENSIVE METABOLIC PANEL (CC13)
ALT: 13 U/L (ref 0–55)
AST: 14 U/L (ref 5–34)
Alkaline Phosphatase: 118 U/L (ref 40–150)
BUN: 7.5 mg/dL (ref 7.0–26.0)
CO2: 27 mEq/L (ref 22–29)
Creatinine: 0.8 mg/dL (ref 0.7–1.3)
Potassium: 4.7 mEq/L (ref 3.5–5.1)
Sodium: 140 mEq/L (ref 136–145)
Total Protein: 6.9 g/dL (ref 6.4–8.3)

## 2012-12-25 MED ORDER — IOHEXOL 300 MG/ML  SOLN
80.0000 mL | Freq: Once | INTRAMUSCULAR | Status: AC | PRN
  Administered 2012-12-25: 80 mL via INTRAVENOUS

## 2012-12-28 ENCOUNTER — Encounter: Payer: Self-pay | Admitting: Internal Medicine

## 2012-12-28 ENCOUNTER — Telehealth: Payer: Self-pay | Admitting: Internal Medicine

## 2012-12-28 ENCOUNTER — Ambulatory Visit (HOSPITAL_BASED_OUTPATIENT_CLINIC_OR_DEPARTMENT_OTHER): Payer: Medicare Other | Admitting: Internal Medicine

## 2012-12-28 VITALS — BP 129/71 | HR 100 | Temp 97.0°F | Resp 18 | Ht 70.0 in | Wt 148.2 lb

## 2012-12-28 DIAGNOSIS — F329 Major depressive disorder, single episode, unspecified: Secondary | ICD-10-CM

## 2012-12-28 DIAGNOSIS — C3490 Malignant neoplasm of unspecified part of unspecified bronchus or lung: Secondary | ICD-10-CM

## 2012-12-28 DIAGNOSIS — C3410 Malignant neoplasm of upper lobe, unspecified bronchus or lung: Secondary | ICD-10-CM

## 2012-12-28 DIAGNOSIS — R0609 Other forms of dyspnea: Secondary | ICD-10-CM

## 2012-12-28 DIAGNOSIS — F17200 Nicotine dependence, unspecified, uncomplicated: Secondary | ICD-10-CM

## 2012-12-28 MED ORDER — MIRTAZAPINE 15 MG PO TABS: 15.0000 mg | ORAL_TABLET | Freq: Every day | ORAL | Status: DC

## 2012-12-28 MED ORDER — METHYLPREDNISOLONE (PAK) 4 MG PO TABS: ORAL_TABLET | ORAL | Status: DC

## 2012-12-28 NOTE — Patient Instructions (Signed)
Followup visit in 6 months with repeat CT scan of the chest pain

## 2012-12-28 NOTE — Progress Notes (Signed)
[facility] Telephone:(336) [phone]   Fax:([phone]  OFFICE PROGRESS NOTE  Barkley Bruns, MD [facility] [address] [place] [address]  PRINCIPAL DIAGNOSES:  1. Stage IIIA non-small cell lung cancer diagnosed in November 2005 with disease recurrence in February 2007. 2. History of pulmonary emboli diagnosed incidentally on CT scan of the chest in May 2006. 3. Right lower extremity deep venous thrombosis diagnosed in August 2007 and the patient was off anticoagulation at that time. PRIOR THERAPY:  1. Status post concurrent chemoradiation with weekly carboplatin and paclitaxel. Last dose was given February 07, 2004. 2. Status post 3 cycles of consolidation chemotherapy with docetaxel. Last dose was given April 04, 2004. 3. Status post 12 cycles of single-agent Alimta for disease recurrence. Last dose was given December 26, 2005 and the patient has disease stabilization since that time. CURRENT THERAPY.:  1. Observation for lung cancer. 2. Coumadin 2.5 mg p.o. daily except Monday and Thursday the patient on 5 mg for history of deep venous thrombosis and pulmonary emboli.   INTERVAL HISTORY: Paul [name] 79 y.o. male returns to the clinic today for routine six-month followup visit. The patient continues to have shortness of breath with exertion. He denied having any significant fever or chills. He denied having any chest pain or hemoptysis. He feels that he is depressed recently and requested medication to help with depression. Unfortunately the patient continues to smoke one and a half pack of cigarettes every day. I strongly advise him to quit smoking and offered him smoke cessation programs in the past and the patient and is unwilling to quit smoking. He denied having any other significant complaints today. He had repeat CT scan of the chest performed recently and he is here for evaluation and discussion of his scan results.   MEDICAL  HISTORY: Past Medical History  Diagnosis Date  . Hypertension   . COPD (chronic obstructive pulmonary disease)   . Hemorrhoids     external  . 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 GERD (gastroesophageal reflux disease)   . Emphysema   . HOH (hard of hearing)   . Non-small cell carcinoma of lung dx'd 2006    stage 3  . Lung cancer     ALLERGIES:  has No Known Allergies.  MEDICATIONS:  Current Outpatient Prescriptions  Medication Sig Dispense Refill  . albuterol (PROVENTIL HFA;VENTOLIN HFA) 108 (90 BASE) MCG/ACT inhaler Inhale 2 puffs into the lungs every 6 (six) hours as needed for wheezing or shortness of breath.  1 Inhaler  2  . doxycycline (VIBRAMYCIN) 100 MG capsule Take 1 capsule (100 mg total) by mouth 2 (two) times daily.  14 capsule  0  . phenytoin (DILANTIN) 100 MG ER capsule TAKE 3 CAPSULES BY MOUTH EVERY DAY  90 capsule  11  . predniSONE (DELTASONE) 50 MG tablet Take 1 tablet (50 mg total) by mouth daily.  5 tablet  0  . sodium chloride (MURO 128) 5 % ophthalmic solution Place 1 drop into both eyes as needed (dry eyes).      . warfarin (COUMADIN) 5 MG tablet Take 5 mg by mouth daily.      Marland Kitchen warfarin (COUMADIN) 5 MG tablet TAKE 1 TABLET BY MOUTH EVERY DAY  30 tablet  3   No current facility-administered medications for this visit.    SURGICAL HISTORY:  Past Surgical History  Procedure Laterality Date  .  Inner ear surgery      REVIEW OF SYSTEMS:  A comprehensive review of systems was negative except for: Constitutional: positive for fatigue Respiratory: positive for cough, dyspnea on exertion and sputum   PHYSICAL EXAMINATION: General appearance: alert, cooperative and no distress Head: Normocephalic, without obvious abnormality, atraumatic Neck: no adenopathy, no JVD, supple, symmetrical, trachea midline and thyroid not enlarged, symmetric, no tenderness/mass/nodules Lymph nodes: Cervical, supraclavicular, and axillary nodes normal. Resp: clear  to auscultation bilaterally Back: symmetric, no curvature. ROM normal. No CVA tenderness. Cardio: regular rate and rhythm, S1, S2 normal, no murmur, click, rub or gallop GI: soft, non-tender; bowel sounds normal; no masses,  no organomegaly Extremities: extremities normal, atraumatic, no cyanosis or edema  ECOG PERFORMANCE STATUS: 1 - Symptomatic but completely ambulatory  There were no vitals taken for this visit.  LABORATORY DATA: Lab Results  Component Value Date   WBC 5.6 12/25/2012   HGB 15.2 12/25/2012   HCT 45.8 12/25/2012   MCV 90.1 12/25/2012   PLT 212 12/25/2012      Chemistry      Component Value Date/Time   NA 140 12/25/2012 0832   NA 137 01/16/2012 0213   NA 142 02/15/2011 0956   K 4.7 12/25/2012 0832   K 4.1 01/16/2012 0213   K 4.4 02/15/2011 0956   CL 100 04/16/2012 0832   CL 104 01/16/2012 0213   CL 99 02/15/2011 0956   CO2 27 12/25/2012 0832   CO2 26 01/16/2012 0213   CO2 29 02/15/2011 0956   BUN 7.5 12/25/2012 0832   BUN 11 01/16/2012 0213   BUN 8 02/15/2011 0956   CREATININE 0.8 12/25/2012 0832   CREATININE 0.67 01/16/2012 0213   CREATININE 0.5* 02/15/2011 0956      Component Value Date/Time   CALCIUM 9.3 12/25/2012 0832   CALCIUM 8.6 01/16/2012 0213   CALCIUM 8.8 02/15/2011 0956   ALKPHOS 118 12/25/2012 0832   ALKPHOS 109* 02/15/2011 0956   ALKPHOS 123* 10/30/2009 0901   AST 14 12/25/2012 0832   AST 18 02/15/2011 0956   AST 18 10/30/2009 0901   ALT 13 12/25/2012 0832   ALT 19 02/15/2011 0956   ALT 16 10/30/2009 0901   BILITOT 0.32 12/25/2012 0832   BILITOT 0.50 02/15/2011 0956   BILITOT 0.7 10/30/2009 0901       RADIOGRAPHIC STUDIES: Ct Chest W Contrast  12/25/2012   CLINICAL DATA:  Followup lung carcinoma.  EXAM: CT CHEST WITH CONTRAST  TECHNIQUE: Multidetector CT imaging of the chest was performed during intravenous contrast administration.  CONTRAST:  80mL OMNIPAQUE IOHEXOL 300 MG/ML  SOLN  COMPARISON:  10/14/2012  FINDINGS: Severe emphysema again  demonstrated. Post treatment fibrosis in the posterior right upper lung field remains stable. An adjacent tiny nodular density noted previously which measures 5 x 9 mm on image 23 remains stable. A another irregular nodular density in the superior segment of the right lower lobe measures 13 x 15 mm on image 28 compared to 13 x 18 mm previously.  No new or enlarging pulmonary nodules or masses are identified. No evidence of new infiltrate or pleural or pericardial effusion.  Small to moderate hiatal hernia is again seen. No evidence of hilar or mediastinal lymphadenopathy. No evidence of chest wall mass or suspicious bone lesions. Both adrenal glands remain normal in appearance.  IMPRESSION: Stable post treatment changes in right hemi thorax. Stable indeterminate right lung nodules. Continued followup by CT recommended.  No new or progressive disease within the  thorax.  Stable severe emphysema and hiatal hernia.   Electronically Signed   By: Myles Rosenthal M.D.   On: 12/25/2012 13:51     ASSESSMENT AND PLAN: This is a very pleasant 79 years old white male with history of stage IIIA non-small cell lung cancer status post several treatment in the past including concurrent chemoradiation followed by consolidation chemotherapy as well as treatment with single agent Alimta but has been on observation since December of 2007. His scan today showed stable disease with no significant evidence for new or progressive disease within the thorax. I recommended for the patient to continue on observation with repeat CT scan of the chest in 6 months. For depression, I started the patient on Remeron 15 mg by mouth each bedtime. For the poor appetite and shortness of breath, I started the patient on Medrol Dosepak. I strongly encouraged him again to quit smoking. The patient was advised to call immediately if he has any concerning symptoms in the interval. The patient voices understanding of current disease status and treatment  options and is in agreement with the current care plan.  All questions were answered. The patient knows to call the clinic with any problems, questions or concerns. We can certainly see the patient much sooner if necessary.

## 2012-12-28 NOTE — Telephone Encounter (Signed)
Gave pt appt for lab and MD on June 2015 °

## 2013-02-25 ENCOUNTER — Encounter (HOSPITAL_COMMUNITY): Payer: Self-pay | Admitting: Emergency Medicine

## 2013-02-25 ENCOUNTER — Emergency Department (INDEPENDENT_AMBULATORY_CARE_PROVIDER_SITE_OTHER)
Admission: EM | Admit: 2013-02-25 | Discharge: 2013-02-25 | Disposition: A | Discharge status: Home / Self Care | Payer: Medicare Other | Source: Home / Self Care | Attending: Family Medicine | Admitting: Family Medicine

## 2013-02-25 ENCOUNTER — Emergency Department (INDEPENDENT_AMBULATORY_CARE_PROVIDER_SITE_OTHER): Payer: Medicare Other

## 2013-02-25 DIAGNOSIS — L98499 Non-pressure chronic ulcer of skin of other sites with unspecified severity: Secondary | ICD-10-CM

## 2013-02-25 MED ORDER — ACETAMINOPHEN 325 MG PO TABS
ORAL_TABLET | ORAL | Status: AC
  Filled 2013-02-25: qty 2

## 2013-02-25 MED ORDER — ACETAMINOPHEN 325 MG PO TABS
650.0000 mg | ORAL_TABLET | Freq: Once | ORAL | Status: AC
  Administered 2013-02-25: 650 mg via ORAL

## 2013-02-25 MED ORDER — MUPIROCIN 2 % EX OINT: TOPICAL_OINTMENT | CUTANEOUS | Status: DC

## 2013-02-25 MED ORDER — MINOCYCLINE HCL 100 MG PO CAPS: 100.0000 mg | ORAL_CAPSULE | Freq: Two times a day (BID) | ORAL | Status: DC

## 2013-02-25 NOTE — Discharge Instructions (Signed)
Thank you for coming in today. Take minocycline twice daily for 10 days.  Use the antibiotic ointment twice or three times daily on the wound.  Do not put hydrogen peroxide on the wound.  Use 2 Tylenol every 6 hours for pain [facility] at [place], [place] [phone]  Followup to get your warfarin level checked Monday or Tuesday

## 2013-02-25 NOTE — ED Provider Notes (Signed)
Paul [name] is a 80 y.o. male who presents to Urgent Care today for left fifth toe pain. Patient has had 2 weeks of an ulcer on the medial border of his left fifth toe. It is slowly worsening. He has been using hydrogen peroxide approximately 12 times a day which seems to help some. He notes the ulceration is worsening. He denies any injury. He denies any history of diabetes but notes that he is a heavy smoker and has a history of DVT and pulmonary embolism as well as stage III non-small cell lung cancer. He is currently taking warfarin for anticoagulation.   Past Medical History  Diagnosis Date  . Hypertension   . COPD (chronic obstructive pulmonary disease)   . Hemorrhoids     external  . 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 GERD (gastroesophageal reflux disease)   . Emphysema   . HOH (hard of hearing)   . Non-small cell carcinoma of lung dx'd 2006    stage 3  . Lung cancer    History  Substance Use Topics  . Smoking status: Current Every Day Smoker -- 2.00 packs/day  . Smokeless tobacco: Not on file  . Alcohol Use: No   ROS as above Medications: No current facility-administered medications for this encounter.   Current Outpatient Prescriptions  Medication Sig Dispense Refill  . albuterol (PROVENTIL HFA;VENTOLIN HFA) 108 (90 BASE) MCG/ACT inhaler Inhale 2 puffs into the lungs every 6 (six) hours as needed for wheezing or shortness of breath.  1 Inhaler  2  . hydrocortisone 2.5 % cream once.      . methylPREDNIsolone (MEDROL DOSPACK) 4 MG tablet follow package directions  21 tablet  0  . minocycline (MINOCIN,DYNACIN) 100 MG capsule Take 1 capsule (100 mg total) by mouth 2 (two) times daily.  20 capsule  0  . mirtazapine (REMERON) 15 MG tablet Take 1 tablet (15 mg total) by mouth at bedtime.  30 tablet  2  . mupirocin ointment (BACTROBAN) 2 % Apply to the toe ulcer twice daily for 1 week  30 g  1  . phenytoin (DILANTIN) 100 MG ER capsule TAKE 3 CAPSULES BY MOUTH  EVERY DAY  90 capsule  11  . sodium chloride (MURO 128) 5 % ophthalmic solution Place 1 drop into both eyes as needed (dry eyes).      . warfarin (COUMADIN) 5 MG tablet TAKE 1 TABLET BY MOUTH EVERY DAY  30 tablet  3    Exam:  BP 183/78  Pulse 90  Temp(Src) 97.6 F (36.4 C) (Oral)  Resp 20  SpO2 97% Gen: Well NAD HEENT: EOMI,  MMM Lungs: Normal work of breathing. CTABL Heart: RRR no MRG Abd: NABS, Soft. NT, ND Exts: Brisk capillary refill, warm and well perfused.  Left foot: Moderately swollen and nontender. Large ulceration on the medial border of the left fifth toe. Tender to touch. No granulation tissue present.  No results found for this or any previous visit (from the past 24 hour(s)). Dg Foot Complete Left  02/25/2013   CLINICAL DATA:  Infected fifth toe.  EXAM: LEFT FOOT - COMPLETE 3+ VIEW  COMPARISON:  No priors.  FINDINGS: Three views of the left foot demonstrate no acute displaced fracture, subluxation or dislocation. No definite osteolysis in the left fifth toe is identified at this time.  IMPRESSION: 1. No osteolysis of the left fifth toe to suggest osteomyelitis on this plain film examination.   Electronically Signed  By: Vinnie Langton M.D.   On: 02/25/2013 10:10    Assessment and Plan: 80 y.o. male with left fifth toe ulceration.  Currently unstageable. I'm concerned it may extend to the bone. Plan to start minocycline antibiotics as well as topical Bactroban. Patient [facility] at 10:00 for further evaluation and management. Additionally I recommend that he get his warfarin level rechecked next week sometime as he is on warfarin Dilantin and now antibiotics.  Discussed warning signs or symptoms. Please see discharge instructions. Patient expresses understanding.    Gregor Hams, MD 02/25/13 1027

## 2013-02-25 NOTE — ED Notes (Signed)
C/o left foot pain States he has been in pain for two weeks now Thinks its gout States little toe is red and has been bleeding

## 2013-02-26 ENCOUNTER — Ambulatory Visit: Payer: Self-pay

## 2013-02-26 ENCOUNTER — Ambulatory Visit (INDEPENDENT_AMBULATORY_CARE_PROVIDER_SITE_OTHER): Payer: Medicare Other

## 2013-02-26 VITALS — BP 149/98 | HR 68 | Temp 96.6°F | Resp 12

## 2013-02-26 DIAGNOSIS — M2040 Other hammer toe(s) (acquired), unspecified foot: Secondary | ICD-10-CM

## 2013-02-26 DIAGNOSIS — B351 Tinea unguium: Secondary | ICD-10-CM

## 2013-02-26 DIAGNOSIS — L97509 Non-pressure chronic ulcer of other part of unspecified foot with unspecified severity: Secondary | ICD-10-CM

## 2013-02-26 DIAGNOSIS — I96 Gangrene, not elsewhere classified: Secondary | ICD-10-CM

## 2013-02-26 DIAGNOSIS — M79676 Pain in unspecified toe(s): Secondary | ICD-10-CM

## 2013-02-26 DIAGNOSIS — M79609 Pain in unspecified limb: Secondary | ICD-10-CM

## 2013-02-26 DIAGNOSIS — I739 Peripheral vascular disease, unspecified: Secondary | ICD-10-CM

## 2013-02-26 MED ORDER — CEPHALEXIN 500 MG PO CAPS: 500.0000 mg | ORAL_CAPSULE | Freq: Three times a day (TID) | ORAL | Status: DC

## 2013-02-26 MED ORDER — HYDROCODONE-ACETAMINOPHEN 5-325 MG PO TABS: 1.0000 | ORAL_TABLET | ORAL | Status: DC | PRN

## 2013-02-26 NOTE — Progress Notes (Signed)
Subjective:    Patient ID: Paul [name], male    DOB: 1930-02-16, 80 y.o.   MRN: [id number]  HPI  '' LT FOOT 5TH TOE IS SWOLLEN AND SORE FOR 2 WEEKS. TREATMENT TRIED SOME TYPE OF CREAM .'' Patient was seen at the urgent center with an x-ray and told me that he does not have gout patient is extremely painful fifth toe left foot with an open sore the entire medial surface is eroded away and the tip of the toe is black and darkened exquisitely painful tender and touchy.   Review of Systems  Constitutional: Positive for chills.  HENT: Positive for sinus pressure.   All other systems reviewed and are negative.       Objective:   Physical Exam Objective findings as follows patient has nonpalpable dorsalis pedis bilateral posterior thready PT pulse on the left and right although not very strong temperature is warm to cool turgor greatly diminished there is no edema rubor pallor noted mild varicosities noted. Neurologically epicritic and proprioceptive sensations are intact exquisite pain tenderness on palpation or touch of the fifth toe left foot. Neurologically skin color pigment normal hair growth absent nails extremely thick criptotic brittle discolored 1 through 5 right 1 through 4 left the nail the fifth digit left foot male all avulsed or partially broken off. There is ulceration medial surface fifth digit left foot with black eschar tissue the distal tuft of the toe on the ulcerations down to the bone which is palpable at this time distal phalanx is exposed medially there is significant atrophy of the toe consistent with gangrenous changes of ischemic changes fifth toe left foot. There is no malodor mild friability drainage is noted. Should note patient has difficulty hearing is difficult time understanding instructions we discussed options at this time immediately to the emergency room for referral for possible application of toe however it appears to be stable afebrile and no immediate  distress otherwise. He is given the option of having the [facility] an [facility] basis [facility] coming [facility] and he elected that hours advised he needs deciliter Ethelene Hal and stay with it and taking home after the procedure. We also stressed needs to come off his Coumadin he is placed on a regimen of antibiotic at this time also some hydrocodone for pain is also administered.       Assessment & Plan:  The ulcer site is cleansed with all cleansed Iodosorb and gauze dressing is applied a Darco shoe is also dispensed offload the foot and toe. Patient will do daily dressing changes wash with soap and water apply the new process ointment patient was prescribed at the urgent center will also take the antibiotic as prescribed cephalexin 500 mg 3 times a day x10 days and will use hydrocodone acetaminophen 500/325 one q. 4H when necessary pain. Consent form for a vacation fifth toe at the MTP level is reviewed and signed with the patient both myself and with Mateo Flow my assistant we discussed his need for the procedure he will obtain a ride for surgery to be done on Monday afternoon at the [facility] specialty surgical center. In the interim device used to daily cleansing with soap and water and wound care as instructed maintain a Darco shoe at all times. It is any fever chills or exacerbations used to go emergency room immediately. Patient refused to allow [place] to, contact any of his family. In particular he does not want his daughter to  know about his foot. Are not too many phone numbers for family and he said he would not be home to answer his phone has some answering machine. Stressed again the importance of following up on Monday for the toe amputation.  Harriet Masson DPM

## 2013-02-26 NOTE — Patient Instructions (Signed)
Pre-Operative Instructions  Congratulations, you have decided to take an important step to improving your quality of life.  You can be assured that the doctors of [facility] will be with you every step of the way.  1. Plan to be at the surgery center/hospital at least 1 (one) hour prior to your scheduled time unless otherwise directed by the surgical center/hospital staff.  You must have a responsible adult accompany you, remain during the surgery and drive you home.  Make sure you have directions to the surgical center/hospital and know how to get there on time. 2. For hospital based surgery you will need to obtain a history and physical form from your family physician within 1 month prior to the date of surgery- we will give you a form for you primary physician.  3. We make every effort to accommodate the date you request for surgery.  There are however, times where surgery dates or times have to be moved.  We will contact you as soon as possible if a change in schedule is required.   4. No Aspirin/Ibuprofen for one week before surgery.  If you are on aspirin, any non-steroidal anti-inflammatory medications (Mobic, Aleve, Ibuprofen) you should stop taking it 7 days prior to your surgery.  You make take Tylenol  For pain prior to surgery.  5. Medications- If you are taking daily heart and blood pressure medications, seizure, reflux, allergy, asthma, anxiety, pain or diabetes medications, make sure the surgery center/hospital is aware before the day of surgery so they may notify you which medications to take or avoid the day of surgery. 6. No food or drink after midnight the night before surgery unless directed otherwise by surgical center/hospital staff. 7. No alcoholic beverages 24 hours prior to surgery.  No smoking 24 hours prior to or 24 hours after surgery. 8. Wear loose pants or shorts- loose enough to fit over bandages, boots, and casts. 9. No slip on shoes, sneakers are best. 10. Bring  your boot with you to the surgery center/hospital.  Also bring crutches or a walker if your physician has prescribed it for you.  If you do not have this equipment, it will be provided for you after surgery. 11. If you have not been contracted by the surgery center/hospital by the day before your surgery, call to confirm the date and time of your surgery. 12. Leave-time from work may vary depending on the type of surgery you have.  Appropriate arrangements should be made prior to surgery with your employer. 13. Prescriptions will be provided immediately following surgery by your doctor.  Have these filled as soon as possible after surgery and take the medication as directed. 14. Remove nail polish on the operative foot. 15. Wash the night before surgery.  The night before surgery wash the foot and leg well with the antibacterial soap provided and water paying special attention to beneath the toenails and in between the toes.  Rinse thoroughly with water and dry well with a towel.  Perform this wash unless told not to do so by your physician.  Enclosed: 1 Ice pack (please put in freezer the night before surgery)   1 Hibiclens skin cleaner   Pre-op Instructions   Discontinue warfarin or Coumadin beginning Saturday before surgery may resume using medication and after surgery is completed  If you have any questions regarding the instructions, do not hesitate to call our office.  [place]: [place], [place] [address] [place]: [phone]  [address], [place], [place] [address] [phone]  [place]: [address]  [place], [place] [address] [phone]  Dr. Kendell Bane DPM, Dr. Ila Mcgill DPM Dr. Harriet Masson DPM, Dr. Lanelle Bal DPM, Dr. Trudie Buckler DPM

## 2013-03-01 DIAGNOSIS — I96 Gangrene, not elsewhere classified: Secondary | ICD-10-CM

## 2013-03-01 DIAGNOSIS — L97509 Non-pressure chronic ulcer of other part of unspecified foot with unspecified severity: Secondary | ICD-10-CM

## 2013-03-05 ENCOUNTER — Ambulatory Visit (INDEPENDENT_AMBULATORY_CARE_PROVIDER_SITE_OTHER): Payer: Medicare Other

## 2013-03-05 VITALS — BP 104/70 | HR 98 | Temp 96.0°F | Resp 16

## 2013-03-05 DIAGNOSIS — L97509 Non-pressure chronic ulcer of other part of unspecified foot with unspecified severity: Secondary | ICD-10-CM

## 2013-03-05 DIAGNOSIS — M2040 Other hammer toe(s) (acquired), unspecified foot: Secondary | ICD-10-CM

## 2013-03-05 DIAGNOSIS — I739 Peripheral vascular disease, unspecified: Secondary | ICD-10-CM

## 2013-03-05 DIAGNOSIS — I96 Gangrene, not elsewhere classified: Secondary | ICD-10-CM

## 2013-03-05 MED ORDER — HYDROCODONE-ACETAMINOPHEN 5-325 MG PO TABS: 1.0000 | ORAL_TABLET | ORAL | Status: DC | PRN

## 2013-03-05 NOTE — Patient Instructions (Signed)
Maintain dressings intact and dry follow written postoperative instructions. Keep appointment for followup visit for Tuesday, February 17 you'll be seen by Dr. Milinda Pointer for dressing change. Maintain antibiotics and pain medications as instructed. Must wearing your surgical shoe at all times and keep your dressings intact and dry next  Harriet Masson DPM

## 2013-03-05 NOTE — Progress Notes (Signed)
   Subjective:    Patient ID: Paul [name], male    DOB: 07/31/1930, 80 y.o.   MRN: [id number]  HPI Comments: "Its so sore"   Patient presents without original bandage. States that it fell off this morning. His foot is covered with only with a white sock.  DOS 03-01-2013 POV Amputation 5th toe left     Review of Systems no new changes or findings     Objective:   Physical Exam Vascular status diminished pulses are thready PT at best the postoperative wound is examined stitches intact no discharge or drainage. There is some erosion on the lateral fourth digit proximal IP joint with slight eschar present patient and these have some hypersensitivity ischemic type pain and we did provide some additional pain medication at this time. The wound appears stable no ascending psoas lymphangitis no increased temperature no malodor. Patient Myriam Jacobson dressing fell off of the day supply of the end of his foot and he came in with just a minimal dressing a bandage on the toe. At this time Betadine and dry sterile dressing applied to the left foot AP dressing for followup visit this coming Tuesday with Dr. Milinda Pointer for dressing change. Stressed to continue with his pain medications and antibiotic medications as       Assessment & Plan:  Assessment good postop progress status post amputation fifth toe left foot due to gangrenous changes pathologic are not available at this time we'll followup with Dr. Milinda Pointer in 4-5 days and then I will see him again in 2 weeks for postop followup. May consider referral some point with the vascular to assess his vascular status to prevent any further breakdowns in the future. Contact us is any changes or exacerbations in symptoms or new problems. Next  Harriet Masson DPM

## 2013-03-09 ENCOUNTER — Encounter: Payer: Medicare Other | Admitting: Podiatry

## 2013-03-12 ENCOUNTER — Encounter: Payer: Self-pay | Admitting: Podiatrist

## 2013-03-12 ENCOUNTER — Ambulatory Visit (INDEPENDENT_AMBULATORY_CARE_PROVIDER_SITE_OTHER): Payer: Medicare Other | Admitting: Podiatrist

## 2013-03-12 VITALS — BP 128/63 | HR 89 | Resp 16

## 2013-03-12 DIAGNOSIS — L97509 Non-pressure chronic ulcer of other part of unspecified foot with unspecified severity: Secondary | ICD-10-CM

## 2013-03-12 DIAGNOSIS — I739 Peripheral vascular disease, unspecified: Secondary | ICD-10-CM

## 2013-03-12 DIAGNOSIS — I96 Gangrene, not elsewhere classified: Secondary | ICD-10-CM

## 2013-03-12 MED ORDER — CEPHALEXIN 500 MG PO CAPS: 500.0000 mg | ORAL_CAPSULE | Freq: Three times a day (TID) | ORAL | Status: DC

## 2013-03-12 MED ORDER — HYDROCODONE-ACETAMINOPHEN 5-325 MG PO TABS: 1.0000 | ORAL_TABLET | ORAL | Status: DC | PRN

## 2013-03-12 NOTE — Patient Instructions (Signed)
Keep your dressing clean, dry and intact-- do not get the foot wet.  Keep elevating for the next week and stay in your surgical shoe.    Continue taking the antibiotic-- another prescription has been called in for you.

## 2013-03-12 NOTE — Progress Notes (Deleted)
Looks ok, left sutures in-- stay on keflex, reorder hydrocodone

## 2013-03-12 NOTE — Progress Notes (Signed)
   Subjective: Patient presents today for his 2 week postop followup status post amputation left fifth toe performed by Dr. Blenda Mounts. He states that the foot is still sore and he can't seem to bandage the foot as it always falls off when he is walking. He presents today in his surgical postop shoe. He denies any nausea, vomiting, fevers, chills, muscle aches or pains.  Objective: Incision site appears to be healing slowly. Minimal redness and inflammation is present along the lateral fifth metatarsal region. No pus, no pirulence, no drainage is seen however the sutures don't appear quite ready to be removed at today's visit. Discomfort with pressure is also noted. Vascular exam reveals nonpalpable pedal pulses on the left foot. Slight eschar continues to be stable and present lateral aspect of the fourth toe.  Assessment: Status post amputation secondary to gangrene left fifth toe  Plan: applied Iodosorb to the incision site and a dry sterile compressive dressing. He is to keep the foot wet and not remove the dressing. He is to continue wearing his surgical shoe and to stay off of his foot. I also wrote him for some more Keflex antibiotics and for more pain medication. He will be seen back in one week for reevaluation and possible suture removal. If she notices any redness, swelling, has any systemic signs of infection he is instructed to call immediately.

## 2013-03-19 ENCOUNTER — Ambulatory Visit (INDEPENDENT_AMBULATORY_CARE_PROVIDER_SITE_OTHER): Payer: Medicare Other

## 2013-03-19 ENCOUNTER — Telehealth: Payer: Self-pay | Admitting: *Deleted

## 2013-03-19 ENCOUNTER — Other Ambulatory Visit: Payer: Self-pay | Admitting: *Deleted

## 2013-03-19 VITALS — BP 104/47 | HR 61 | Resp 16

## 2013-03-19 DIAGNOSIS — Z09 Encounter for follow-up examination after completed treatment for conditions other than malignant neoplasm: Secondary | ICD-10-CM

## 2013-03-19 DIAGNOSIS — M79606 Pain in leg, unspecified: Secondary | ICD-10-CM

## 2013-03-19 DIAGNOSIS — I739 Peripheral vascular disease, unspecified: Secondary | ICD-10-CM

## 2013-03-19 DIAGNOSIS — I96 Gangrene, not elsewhere classified: Secondary | ICD-10-CM

## 2013-03-19 DIAGNOSIS — L97509 Non-pressure chronic ulcer of other part of unspecified foot with unspecified severity: Secondary | ICD-10-CM

## 2013-03-19 MED ORDER — HYDROCODONE-ACETAMINOPHEN 5-325 MG PO TABS: 1.0000 | ORAL_TABLET | ORAL | Status: DC | PRN

## 2013-03-19 NOTE — Patient Instructions (Signed)
Betadine Soak Instructions  Purchase an 8 oz. bottle of BETADINE solution (Povidone)  THE DAY AFTER THE PROCEDURE  Place 1 tablespoon of betadine solution in a quart of warm tap water.  Submerge your foot or feet with outer bandage intact for the initial soak; this will allow the bandage to become moist and wet for easy lift off.  Once you remove your bandage, continue to soak in the solution for 20 minutes.  This soak should be done twice a day.  Next, remove your foot or feet from solution, blot dry the affected area and cover.  You may use a band aid large enough to cover the area or use gauze and tape.  Recommend doing a redressing of the foot with a saline wet-to-dry dressing arrange for home nursing provided dressings materials and initiate dressing changes..  IF YOUR SKIN BECOMES IRRITATED WHILE USING THESE INSTRUCTIONS, IT IS OKAY TO SWITCH TO EPSOM SALTS AND WATER OR WHITE VINEGAR AND WATER.

## 2013-03-19 NOTE — Telephone Encounter (Signed)
Dr Blenda Mounts ordered home health care to perform wet to dry dressing to left foot 3 x week beginning 03/23/2013.  Orders given to pt, called to [facility] [phone] and faxed to [facility].

## 2013-03-19 NOTE — Progress Notes (Signed)
   Subjective:    Patient ID: Paul [name], male    DOB: 1930-11-23, 80 y.o.   MRN: [id number]  HPI Comments: "It has been so sore. Its been throbbing and hurting a lot"  DOS 03-01-2013 POV Amputation 5th toe left      Review of Systems no new changes patient continues to have severe pain of his left foot and leg with vascular changes noted. Having some dehiscence of the wound some suture intact are removed at this time.     Objective:   Physical Exam Vascular status is unchanged pedal pulses nonpalpable bilateral Refill time 3-4 seconds patient's temperature is cool there is no ascending psoas lymphangitis there is some maceration and sutures are removed at this time slight eschar tissue lateral fourth toe and the amputation site fifth toe. There is exquisite painful tender symptomatically for severe hyperesthesia is not able not very quiet during his visit continue to move and shift around and keeps requesting pain medications. Patient at this time also refused to allow [place] to contact the family members for assistance although he is hard of hearing and does not understand his instructions has not been wearing his dressing he with dressing, from and walk around without at this time I do feel patient has very poor compliance does not understand severely this wound is again advised that he could lose his foot or leg or life as a result of this infection or gangrene can spread patient advised he still taking his antibiotic regimen advised he needs to continue to do so also in the posterior for pain medicine dispensed make arrangements for evaluation with DDS for vascular consult also will arrange home nursing for daily saline wet to dry dressing changes of the wound site.       Assessment & Plan:  Assessment this time patient does have severe PVD cocking factors with gangrenous toe had been amputated continues to have some mild dehiscence of the wound site and continued ischemic changes slow  healing at this time the area is cleansed with all cleansed suture toes removed I does or gauze dressing is applied patient will maintain daily dressing changes we'll arrange for home nursing new saline wet-to-dry changes and cleansing with either soap and water Betadine warm water or Epsom salts in warm water reappointed in one week for further followup and reevaluation again stressed that he needs help he indicates to his apartment complex has nursing available 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

## 2013-03-22 ENCOUNTER — Telehealth: Payer: Self-pay | Admitting: *Deleted

## 2013-03-22 ENCOUNTER — Other Ambulatory Visit: Payer: Self-pay | Admitting: Internal Medicine

## 2013-03-22 NOTE — Telephone Encounter (Signed)
Paul Bongo, RN states pt is not homebound, and refuses to allow anyone to come into his house and apply dressings.  Ms Paul [name] states he did allow her to dress the foot today, since it did not have the 02/27/2013 dressing. Ms Paul [name] states pt has medication bottles all over the house from different years and as old as 10 years, and will admit to smoking over 2 packs of cigarettes per day.  I told Ms Paul [name] I would inform Dr Blenda Mounts.

## 2013-03-23 ENCOUNTER — Telehealth: Payer: Self-pay | Admitting: *Deleted

## 2013-03-23 NOTE — Telephone Encounter (Signed)
Pt is scheduled for B/L lower arterial dopplers for 03/24/2013 at 1100am.

## 2013-03-23 NOTE — Telephone Encounter (Signed)
Ms. Paul [name] states pt will allow to dress left foot on 0304/2015.

## 2013-03-23 NOTE — Telephone Encounter (Signed)
Message copied by Andres Ege on Tue Mar 23, 2013  1:45 PM ------      Message from: Rufina Falco      Created: Mon Mar 22, 2013  9:00 AM      Regarding: Appointment Scheduled 03/04       We will see Paul [name] 03/04 @ 11am.   He declined an appointment for today, 03/02.               Rip Harbour ------

## 2013-03-24 ENCOUNTER — Other Ambulatory Visit: Payer: Self-pay

## 2013-03-24 ENCOUNTER — Other Ambulatory Visit (HOSPITAL_COMMUNITY): Payer: Self-pay

## 2013-03-24 ENCOUNTER — Ambulatory Visit (HOSPITAL_COMMUNITY)
Admission: RE | Admit: 2013-03-24 | Discharge: 2013-03-24 | Disposition: A | Discharge status: Home / Self Care | Payer: Medicare Other | Source: Ambulatory Visit

## 2013-03-24 DIAGNOSIS — R0989 Other specified symptoms and signs involving the circulatory and respiratory systems: Secondary | ICD-10-CM

## 2013-03-24 DIAGNOSIS — M79673 Pain in unspecified foot: Secondary | ICD-10-CM

## 2013-03-24 DIAGNOSIS — I998 Other disorder of circulatory system: Secondary | ICD-10-CM

## 2013-03-24 DIAGNOSIS — M79609 Pain in unspecified limb: Secondary | ICD-10-CM | POA: Insufficient documentation

## 2013-03-24 DIAGNOSIS — M79606 Pain in leg, unspecified: Secondary | ICD-10-CM

## 2013-03-24 DIAGNOSIS — I999 Unspecified disorder of circulatory system: Secondary | ICD-10-CM

## 2013-03-26 ENCOUNTER — Ambulatory Visit (INDEPENDENT_AMBULATORY_CARE_PROVIDER_SITE_OTHER): Payer: Medicare Other

## 2013-03-26 VITALS — BP 159/69 | HR 85 | Resp 18 | Ht 70.0 in | Wt 150.0 lb

## 2013-03-26 DIAGNOSIS — Z09 Encounter for follow-up examination after completed treatment for conditions other than malignant neoplasm: Secondary | ICD-10-CM

## 2013-03-26 DIAGNOSIS — M79606 Pain in leg, unspecified: Secondary | ICD-10-CM

## 2013-03-26 DIAGNOSIS — L97509 Non-pressure chronic ulcer of other part of unspecified foot with unspecified severity: Secondary | ICD-10-CM

## 2013-03-26 DIAGNOSIS — I739 Peripheral vascular disease, unspecified: Secondary | ICD-10-CM

## 2013-03-26 DIAGNOSIS — M79609 Pain in unspecified limb: Secondary | ICD-10-CM

## 2013-03-26 MED ORDER — HYDROCODONE-ACETAMINOPHEN 5-325 MG PO TABS: 1.0000 | ORAL_TABLET | ORAL | Status: DC | PRN

## 2013-03-26 MED ORDER — SILVER SULFADIAZINE 1 % EX CREA: TOPICAL_CREAM | CUTANEOUS | Status: DC

## 2013-03-26 NOTE — Progress Notes (Signed)
Pt presents for post op check of left foot, without dressing to the left foot, but is in a surgical shoe.  Pt states he's about out of pain medication.  Pt has been drinking hot coffee, and was unable to get a correct temperature. Next  Patient is status post amputation fifth toe left foot did have ask your studies done today still have not received those results yet. Patient needs to have ischemic pain and dehiscence of the amputation site also slight ulceration lateral fourth digit left foot.  Review of systems unremarkable no new changes noted.  J objective findings as follows pedal pulses palpable although diminished bilateral there is capillary refill time 3 seconds all digits there is eschar tissue and ulceration with mild serous drainage noted amputation site fifth toe left foot there is small ulceration lateral proximal IP joint fourth toe left foot. Patient does have again weakened are thready pulses at best with ischemic changes cool temperature to the foot. No ascending psoas lymphangitis at this time the ulcer is cleansed with all cleansed Silvadene and gauze dressing applied dispensed samples gauze wrap he will get dressing changes at the pharmacy or medical supply patient also prescribed additional Vicodin for pain and a prescription for Silvadene cream to apply daily after Epson salt soaks and dressing change.  Assessment: Patient is postop amputation fifth toe with continued ischemic changes and complications delayed healing or dehiscence of the wound is noted. Will continue with Silvadene gauze dressing changes daily continue with topical antibiotic Silvadene cream also patient will was prescribed  Medication stressed the importance of keeping the dressing on daily and changing the dressing every day maintaining the with surgical shoe at all times we'll contact patient was received the results of his vascular studies. Reappointed one week for further postop followup  Harriet Masson DPM

## 2013-03-26 NOTE — Patient Instructions (Signed)
Instructions for soaking ulcer and wound site of left foot.  Make a solution of warm water and Epsom salts proxy 2 tablespoons of Epsom salts to 2 quart of warm water soak the foot twice daily for 10 minutes rinse and dry thoroughly the and reapply Silvadene ointment and gauze dressing as instructed to the fourth toe and ulcer site amputation site of the fifth toe left foot. Repeat these cleansing and dressing changes daily as instructed for the next 2-3 weeks until the wound has resolved  Contact us visiting changes or exacerbations any increased redness swelling the for chills go immediately to the emergency room.  Harriet Masson DPM

## 2013-04-01 ENCOUNTER — Emergency Department (HOSPITAL_COMMUNITY): Payer: Medicare Other

## 2013-04-01 ENCOUNTER — Inpatient Hospital Stay (HOSPITAL_COMMUNITY)
Admission: EM | Admit: 2013-04-01 | Discharge: 2013-04-03 | DRG: 312 | Disposition: A | Discharge status: Home / Self Care | Payer: Medicare Other | Attending: Internal Medicine | Admitting: Internal Medicine

## 2013-04-01 ENCOUNTER — Inpatient Hospital Stay (HOSPITAL_COMMUNITY): Payer: Medicare Other

## 2013-04-01 ENCOUNTER — Encounter (HOSPITAL_COMMUNITY): Payer: Self-pay | Admitting: Emergency Medicine

## 2013-04-01 DIAGNOSIS — Z9221 Personal history of antineoplastic chemotherapy: Secondary | ICD-10-CM

## 2013-04-01 DIAGNOSIS — Z86718 Personal history of other venous thrombosis and embolism: Secondary | ICD-10-CM

## 2013-04-01 DIAGNOSIS — L02619 Cutaneous abscess of unspecified foot: Secondary | ICD-10-CM | POA: Diagnosis present

## 2013-04-01 DIAGNOSIS — G9340 Encephalopathy, unspecified: Secondary | ICD-10-CM | POA: Diagnosis present

## 2013-04-01 DIAGNOSIS — J189 Pneumonia, unspecified organism: Secondary | ICD-10-CM | POA: Diagnosis present

## 2013-04-01 DIAGNOSIS — T50901A Poisoning by unspecified drugs, medicaments and biological substances, accidental (unintentional), initial encounter: Secondary | ICD-10-CM

## 2013-04-01 DIAGNOSIS — K219 Gastro-esophageal reflux disease without esophagitis: Secondary | ICD-10-CM | POA: Diagnosis present

## 2013-04-01 DIAGNOSIS — M86679 Other chronic osteomyelitis, unspecified ankle and foot: Secondary | ICD-10-CM | POA: Diagnosis present

## 2013-04-01 DIAGNOSIS — Z923 Personal history of irradiation: Secondary | ICD-10-CM

## 2013-04-01 DIAGNOSIS — Z7901 Long term (current) use of anticoagulants: Secondary | ICD-10-CM

## 2013-04-01 DIAGNOSIS — F17200 Nicotine dependence, unspecified, uncomplicated: Secondary | ICD-10-CM | POA: Diagnosis present

## 2013-04-01 DIAGNOSIS — Z85118 Personal history of other malignant neoplasm of bronchus and lung: Secondary | ICD-10-CM

## 2013-04-01 DIAGNOSIS — R55 Syncope and collapse: Principal | ICD-10-CM | POA: Diagnosis present

## 2013-04-01 DIAGNOSIS — L03119 Cellulitis of unspecified part of limb: Secondary | ICD-10-CM

## 2013-04-01 DIAGNOSIS — I70209 Unspecified atherosclerosis of native arteries of extremities, unspecified extremity: Secondary | ICD-10-CM | POA: Diagnosis present

## 2013-04-01 DIAGNOSIS — I10 Essential (primary) hypertension: Secondary | ICD-10-CM | POA: Diagnosis present

## 2013-04-01 DIAGNOSIS — C3490 Malignant neoplasm of unspecified part of unspecified bronchus or lung: Secondary | ICD-10-CM | POA: Diagnosis present

## 2013-04-01 DIAGNOSIS — M86672 Other chronic osteomyelitis, left ankle and foot: Secondary | ICD-10-CM

## 2013-04-01 DIAGNOSIS — Z91199 Patient's noncompliance with other medical treatment and regimen due to unspecified reason: Secondary | ICD-10-CM

## 2013-04-01 DIAGNOSIS — I82409 Acute embolism and thrombosis of unspecified deep veins of unspecified lower extremity: Secondary | ICD-10-CM | POA: Diagnosis present

## 2013-04-01 DIAGNOSIS — F1021 Alcohol dependence, in remission: Secondary | ICD-10-CM

## 2013-04-01 DIAGNOSIS — Z9119 Patient's noncompliance with other medical treatment and regimen: Secondary | ICD-10-CM

## 2013-04-01 HISTORY — DX: Unspecified convulsions: R56.9

## 2013-04-01 HISTORY — DX: Pneumonia, unspecified organism: J18.9

## 2013-04-01 LAB — URINALYSIS, ROUTINE W REFLEX MICROSCOPIC
Bilirubin Urine: NEGATIVE
Glucose, UA: NEGATIVE mg/dL
HGB URINE DIPSTICK: NEGATIVE
Ketones, ur: NEGATIVE mg/dL
Leukocytes, UA: NEGATIVE
Nitrite: NEGATIVE
Protein, ur: 30 mg/dL — AB
Specific Gravity, Urine: 1.02 (ref 1.005–1.030)
Urobilinogen, UA: 0.2 mg/dL (ref 0.0–1.0)
pH: 5.5 (ref 5.0–8.0)

## 2013-04-01 LAB — RAPID URINE DRUG SCREEN, HOSP PERFORMED
AMPHETAMINES: NOT DETECTED
Barbiturates: NOT DETECTED
Benzodiazepines: NOT DETECTED
Cocaine: NOT DETECTED
OPIATES: NOT DETECTED
Tetrahydrocannabinol: NOT DETECTED

## 2013-04-01 LAB — ETHANOL

## 2013-04-01 LAB — COMPREHENSIVE METABOLIC PANEL
ALT: 15 U/L (ref 0–53)
AST: 19 U/L (ref 0–37)
Albumin: 3.9 g/dL (ref 3.5–5.2)
Alkaline Phosphatase: 132 U/L — ABNORMAL HIGH (ref 39–117)
BILIRUBIN TOTAL: 0.3 mg/dL (ref 0.3–1.2)
BUN: 8 mg/dL (ref 6–23)
CO2: 24 meq/L (ref 19–32)
CREATININE: 0.68 mg/dL (ref 0.50–1.35)
Calcium: 9.5 mg/dL (ref 8.4–10.5)
Chloride: 96 mEq/L (ref 96–112)
GFR, EST NON AFRICAN AMERICAN: 88 mL/min — AB (ref >90)
GLUCOSE: 128 mg/dL — AB (ref 70–99)
Potassium: 4.1 mEq/L (ref 3.7–5.3)
Sodium: 136 mEq/L — ABNORMAL LOW (ref 137–147)
Total Protein: 7.5 g/dL (ref 6.0–8.3)

## 2013-04-01 LAB — CBC WITH DIFFERENTIAL/PLATELET
Basophils Absolute: 0 10*3/uL (ref 0.0–0.1)
Basophils Relative: 0 % (ref 0–1)
EOS ABS: 0 10*3/uL (ref 0.0–0.7)
Eosinophils Relative: 0 % (ref 0–5)
HCT: 47.4 % (ref 39.0–52.0)
Hemoglobin: 16.6 g/dL (ref 13.0–17.0)
LYMPHS PCT: 4 % — AB (ref 12–46)
Lymphs Abs: 0.5 10*3/uL — ABNORMAL LOW (ref 0.7–4.0)
MCH: 30.5 pg (ref 26.0–34.0)
MCHC: 35 g/dL (ref 30.0–36.0)
MCV: 87.1 fL (ref 78.0–100.0)
Monocytes Absolute: 0.8 10*3/uL (ref 0.1–1.0)
Monocytes Relative: 6 % (ref 3–12)
Neutro Abs: 12.9 10*3/uL — ABNORMAL HIGH (ref 1.7–7.7)
Neutrophils Relative %: 90 % — ABNORMAL HIGH (ref 43–77)
PLATELETS: 259 10*3/uL (ref 150–400)
RBC: 5.44 MIL/uL (ref 4.22–5.81)
RDW: 13.1 % (ref 11.5–15.5)
WBC: 14.3 10*3/uL — AB (ref 4.0–10.5)

## 2013-04-01 LAB — LIPASE, BLOOD: Lipase: 15 U/L (ref 11–59)

## 2013-04-01 LAB — URINE MICROSCOPIC-ADD ON

## 2013-04-01 LAB — I-STAT TROPONIN, ED: TROPONIN I, POC: 0.02 ng/mL (ref 0.00–0.08)

## 2013-04-01 LAB — SALICYLATE LEVEL

## 2013-04-01 LAB — PROTIME-INR
INR: 1.25 (ref 0.00–1.49)
Prothrombin Time: 15.4 seconds — ABNORMAL HIGH (ref 11.6–15.2)

## 2013-04-01 LAB — ACETAMINOPHEN LEVEL: Acetaminophen (Tylenol), Serum: <15 ug/mL (ref 10–30)

## 2013-04-01 LAB — I-STAT CG4 LACTIC ACID, ED: Lactic Acid, Venous: 2.7 mmol/L — ABNORMAL HIGH (ref 0.5–2.2)

## 2013-04-01 LAB — PHENYTOIN LEVEL, TOTAL

## 2013-04-01 MED ORDER — MORPHINE SULFATE 4 MG/ML IJ SOLN
4.0000 mg | Freq: Once | INTRAMUSCULAR | Status: AC
  Administered 2013-04-01: 4 mg via INTRAVENOUS
  Filled 2013-04-01: qty 1

## 2013-04-01 MED ORDER — PHENYTOIN SODIUM EXTENDED 100 MG PO CAPS
200.0000 mg | ORAL_CAPSULE | Freq: Two times a day (BID) | ORAL | Status: DC
  Administered 2013-04-02: 200 mg via ORAL
  Filled 2013-04-01 (×2): qty 2

## 2013-04-01 MED ORDER — AZITHROMYCIN 500 MG IV SOLR
500.0000 mg | Freq: Once | INTRAVENOUS | Status: AC
  Administered 2013-04-01: 500 mg via INTRAVENOUS

## 2013-04-01 MED ORDER — SODIUM CHLORIDE 0.9 % IV BOLUS (SEPSIS)
1000.0000 mL | Freq: Once | INTRAVENOUS | Status: AC
  Administered 2013-04-01: 1000 mL via INTRAVENOUS

## 2013-04-01 MED ORDER — HYDROCORTISONE 2.5 % EX CREA
1.0000 "application " | TOPICAL_CREAM | Freq: Every day | CUTANEOUS | Status: DC | PRN
  Filled 2013-04-01: qty 30

## 2013-04-01 MED ORDER — WARFARIN - PHARMACIST DOSING INPATIENT: Freq: Every day | Status: DC

## 2013-04-01 MED ORDER — SODIUM CHLORIDE 0.9 % IJ SOLN
3.0000 mL | Freq: Two times a day (BID) | INTRAMUSCULAR | Status: DC
Start: 2013-04-01 — End: 2013-04-03
  Administered 2013-04-02 – 2013-04-03 (×2): 3 mL via INTRAVENOUS

## 2013-04-01 MED ORDER — ACETAMINOPHEN 650 MG RE SUPP: 650.0000 mg | Freq: Four times a day (QID) | RECTAL | Status: DC | PRN

## 2013-04-01 MED ORDER — VANCOMYCIN HCL 500 MG IV SOLR
500.0000 mg | Freq: Two times a day (BID) | INTRAVENOUS | Status: DC
  Filled 2013-04-01: qty 500

## 2013-04-01 MED ORDER — IPRATROPIUM-ALBUTEROL 0.5-2.5 (3) MG/3ML IN SOLN
3.0000 mL | Freq: Two times a day (BID) | RESPIRATORY_TRACT | Status: DC
  Administered 2013-04-01: 3 mL via RESPIRATORY_TRACT
  Filled 2013-04-01 (×3): qty 3

## 2013-04-01 MED ORDER — WARFARIN SODIUM 7.5 MG PO TABS
7.5000 mg | ORAL_TABLET | Freq: Once | ORAL | Status: AC
  Administered 2013-04-02: 7.5 mg via ORAL
  Filled 2013-04-01: qty 1

## 2013-04-01 MED ORDER — DEXTROSE 5 % IV SOLN
1.0000 g | Freq: Once | INTRAVENOUS | Status: AC
  Administered 2013-04-01: 1 g via INTRAVENOUS
  Filled 2013-04-01: qty 10

## 2013-04-01 MED ORDER — ALBUTEROL SULFATE (2.5 MG/3ML) 0.083% IN NEBU: 3.0000 mL | INHALATION_SOLUTION | Freq: Four times a day (QID) | RESPIRATORY_TRACT | Status: DC | PRN

## 2013-04-01 MED ORDER — SODIUM CHLORIDE 0.9 % IV SOLN
INTRAVENOUS | Status: DC
Start: 2013-04-01 — End: 2013-04-02

## 2013-04-01 MED ORDER — SILVER SULFADIAZINE 1 % EX CREA
TOPICAL_CREAM | Freq: Every day | CUTANEOUS | Status: DC
  Administered 2013-04-02: 11:00:00 via TOPICAL
  Filled 2013-04-01: qty 85

## 2013-04-01 MED ORDER — ACETAMINOPHEN 325 MG PO TABS
650.0000 mg | ORAL_TABLET | Freq: Four times a day (QID) | ORAL | Status: DC | PRN
  Administered 2013-04-02: 650 mg via ORAL
  Filled 2013-04-01: qty 2

## 2013-04-01 MED ORDER — VANCOMYCIN HCL IN DEXTROSE 1-5 GM/200ML-% IV SOLN
1000.0000 mg | INTRAVENOUS | Status: DC
  Filled 2013-04-01: qty 200

## 2013-04-01 MED ORDER — SODIUM CHLORIDE 0.9 % IV SOLN
1000.0000 mg | Freq: Once | INTRAVENOUS | Status: DC
  Filled 2013-04-01: qty 20

## 2013-04-01 NOTE — ED Notes (Signed)
Dr. Cherlynn June at the bedside.
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
## 2013-04-01 NOTE — Progress Notes (Signed)
MD ordered sputum sample on patient.  Gave patient sputum cup and explained when he coughed up anything to spit it in the cup and give to RN to send off.
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
## 2013-04-01 NOTE — ED Notes (Signed)
MD at bedside. 

## 2013-04-01 NOTE — ED Notes (Signed)
Pt upset and keeps asking if he can have something for pain. Explained to pt multiple times that he had too much pain medicine in his system when EMS brought him into the ED and that is why the doctor won't let him have anything. Pt verbalizes understanding and then begins to ask again 5-10 min later.
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
## 2013-04-02 ENCOUNTER — Encounter (HOSPITAL_COMMUNITY): Payer: Self-pay | Admitting: *Deleted

## 2013-04-02 DIAGNOSIS — R55 Syncope and collapse: Principal | ICD-10-CM

## 2013-04-02 DIAGNOSIS — J189 Pneumonia, unspecified organism: Secondary | ICD-10-CM

## 2013-04-02 DIAGNOSIS — M869 Osteomyelitis, unspecified: Secondary | ICD-10-CM

## 2013-04-02 DIAGNOSIS — I739 Peripheral vascular disease, unspecified: Secondary | ICD-10-CM

## 2013-04-02 DIAGNOSIS — I10 Essential (primary) hypertension: Secondary | ICD-10-CM

## 2013-04-02 LAB — TSH: TSH: 2.741 u[IU]/mL (ref 0.350–4.500)

## 2013-04-02 LAB — BASIC METABOLIC PANEL
BUN: 6 mg/dL (ref 6–23)
CO2: 26 meq/L (ref 19–32)
CREATININE: 0.68 mg/dL (ref 0.50–1.35)
Calcium: 9 mg/dL (ref 8.4–10.5)
Chloride: 101 mEq/L (ref 96–112)
GFR calc Af Amer: >90 mL/min (ref >90)
GFR calc non Af Amer: 88 mL/min — ABNORMAL LOW (ref >90)
Glucose, Bld: 96 mg/dL (ref 70–99)
Potassium: 4.5 mEq/L (ref 3.7–5.3)
Sodium: 140 mEq/L (ref 137–147)

## 2013-04-02 LAB — EXPECTORATED SPUTUM ASSESSMENT W GRAM STAIN, RFLX TO RESP C

## 2013-04-02 LAB — CBC
HCT: 40.3 % (ref 39.0–52.0)
HEMOGLOBIN: 13.9 g/dL (ref 13.0–17.0)
MCH: 30.2 pg (ref 26.0–34.0)
MCHC: 34.5 g/dL (ref 30.0–36.0)
MCV: 87.6 fL (ref 78.0–100.0)
PLATELETS: 220 10*3/uL (ref 150–400)
RBC: 4.6 MIL/uL (ref 4.22–5.81)
RDW: 13.1 % (ref 11.5–15.5)
WBC: 7.8 10*3/uL (ref 4.0–10.5)

## 2013-04-02 LAB — TROPONIN I: Troponin I: <0.3 ng/mL (ref <0.30)

## 2013-04-02 LAB — LEGIONELLA ANTIGEN, URINE: LEGIONELLA ANTIGEN, URINE: NEGATIVE

## 2013-04-02 LAB — EXPECTORATED SPUTUM ASSESSMENT W REFEX TO RESP CULTURE

## 2013-04-02 LAB — PROTIME-INR
INR: 1.19 (ref 0.00–1.49)
Prothrombin Time: 14.8 seconds (ref 11.6–15.2)

## 2013-04-02 LAB — STREP PNEUMONIAE URINARY ANTIGEN: Strep Pneumo Urinary Antigen: NEGATIVE

## 2013-04-02 MED ORDER — DEXTROSE-NACL 5-0.45 % IV SOLN
INTRAVENOUS | Status: DC
  Administered 2013-04-02: 13:00:00 via INTRAVENOUS

## 2013-04-02 MED ORDER — ALBUTEROL SULFATE (2.5 MG/3ML) 0.083% IN NEBU: 3.0000 mL | INHALATION_SOLUTION | RESPIRATORY_TRACT | Status: DC | PRN

## 2013-04-02 MED ORDER — LEVOFLOXACIN IN D5W 750 MG/150ML IV SOLN
750.0000 mg | INTRAVENOUS | Status: DC
  Filled 2013-04-02 (×2): qty 150

## 2013-04-02 MED ORDER — SODIUM CHLORIDE 0.9 % IV BOLUS (SEPSIS)
500.0000 mL | Freq: Once | INTRAVENOUS | Status: AC
  Administered 2013-04-02: 500 mL via INTRAVENOUS

## 2013-04-02 MED ORDER — LORAZEPAM 2 MG/ML IJ SOLN
1.0000 mg | Freq: Once | INTRAMUSCULAR | Status: DC
Start: 2013-04-02 — End: 2013-04-02
  Filled 2013-04-02: qty 1

## 2013-04-02 MED ORDER — ENSURE COMPLETE PO LIQD
237.0000 mL | Freq: Two times a day (BID) | ORAL | Status: DC
  Administered 2013-04-02 – 2013-04-03 (×2): 237 mL via ORAL

## 2013-04-02 MED ORDER — QUETIAPINE FUMARATE 25 MG PO TABS
25.0000 mg | ORAL_TABLET | Freq: Every day | ORAL | Status: DC
  Administered 2013-04-02: 25 mg via ORAL
  Filled 2013-04-02 (×2): qty 1

## 2013-04-02 MED ORDER — LEVOFLOXACIN 750 MG PO TABS
750.0000 mg | ORAL_TABLET | Freq: Every day | ORAL | Status: DC
  Filled 2013-04-02: qty 1

## 2013-04-02 MED ORDER — VANCOMYCIN HCL IN DEXTROSE 750-5 MG/150ML-% IV SOLN
750.0000 mg | Freq: Two times a day (BID) | INTRAVENOUS | Status: DC
  Filled 2013-04-02: qty 150

## 2013-04-02 MED ORDER — HYDROCODONE-ACETAMINOPHEN 5-325 MG PO TABS: 0.5000 | ORAL_TABLET | Freq: Four times a day (QID) | ORAL | Status: DC | PRN

## 2013-04-02 MED ORDER — VANCOMYCIN HCL IN DEXTROSE 1-5 GM/200ML-% IV SOLN
1000.0000 mg | INTRAVENOUS | Status: DC
  Filled 2013-04-02: qty 200

## 2013-04-02 MED ORDER — LEVETIRACETAM 500 MG PO TABS
500.0000 mg | ORAL_TABLET | Freq: Two times a day (BID) | ORAL | Status: DC
  Administered 2013-04-02 – 2013-04-03 (×3): 500 mg via ORAL
  Filled 2013-04-02 (×4): qty 1
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
## 2013-04-02 NOTE — Progress Notes (Signed)
Subjective: Paul [name] is doing better this morning, complaining of pain in his left foot.  No chest pain, shortness of breath, abdominal pain, no recurrent syncope or pre-syncope.   Objective: Vital signs in last 24 hours: Filed Vitals:   04/01/13 2339 04/01/13 2342 04/01/13 2345 04/02/13 0349  BP: 109/49 103/77 150/85 109/87  Pulse: 97 100 100 88  Temp:    97.9 F (36.6 C)  TempSrc:    Oral  Resp:      Height:  5\' 10"  (1.778 m)    Weight:  149 lb 14.6 oz (68 kg)  149 lb 14.6 oz (68 kg)  SpO2: 97% 92% 93% 95%   Weight change:  No intake or output data in the 24 hours ending 04/02/13 0712  PEX General: alert, cooperative, and in no apparent distress HEENT: NCAT, vision grossly intact, oropharynx clear and non-erythematous  Neck: supple, no lymphadenopathy Lungs: mild bilateral wheezing, normal work of respiration, no rales, ronchi Heart: regular rate and rhythm, no murmurs, gallops, or rubs Abdomen: soft, non-tender, non-distended, normal bowel sounds Extremities: left anterior leg erythematous and warm with non-healing ulcer at site of left 5th toe amputation; 2+ DP/PT pulses bilaterally, no cyanosis, clubbing, or edema Neurologic: alert & oriented X3, cranial nerves II-XII intact, strength grossly intact, sensation intact to light touch  Lab Results: Basic Metabolic Panel:  Recent Labs Lab 04/01/13 1749  NA 136*  K 4.1  CL 96  CO2 24  GLUCOSE 128*  BUN 8  CREATININE 0.68  CALCIUM 9.5   Liver Function Tests:  Recent Labs Lab 04/01/13 1749  AST 19  ALT 15  ALKPHOS 132*  BILITOT 0.3  PROT 7.5  ALBUMIN 3.9    Recent Labs Lab 04/01/13 1749  LIPASE 15   CBC:  Recent Labs Lab 04/01/13 1749 04/02/13 0550  WBC 14.3* 7.8  NEUTROABS 12.9*  --   HGB 16.6 13.9  HCT 47.4 40.3  MCV 87.1 87.6  PLT 259 220   Cardiac Enzymes:  Recent Labs Lab 04/02/13 0550  TROPONINI <0.30   Coagulation:  Recent Labs Lab 04/01/13 1749 04/02/13 0550  LABPROT  15.4* 14.8  INR 1.25 1.19   Urine Drug Screen: Drugs of Abuse     Component Value Date/Time   LABOPIA NONE DETECTED 04/01/2013 1858   COCAINSCRNUR NONE DETECTED 04/01/2013 1858   LABBENZ NONE DETECTED 04/01/2013 1858   AMPHETMU NONE DETECTED 04/01/2013 1858   THCU NONE DETECTED 04/01/2013 1858   LABBARB NONE DETECTED 04/01/2013 1858    Alcohol Level:  Recent Labs Lab 04/01/13 2132  ETH <11   Urinalysis:  Recent Labs Lab 04/01/13 1705  COLORURINE YELLOW  LABSPEC 1.020  PHURINE 5.5  GLUCOSEU NEGATIVE  HGBUR NEGATIVE  BILIRUBINUR NEGATIVE  KETONESUR NEGATIVE  PROTEINUR 30*  UROBILINOGEN 0.2  NITRITE NEGATIVE  LEUKOCYTESUR NEGATIVE    Studies/Results: Dg Chest 2 View  04/01/2013   CLINICAL DATA:  Altered mental status  EXAM: CHEST  2 VIEW  COMPARISON:  CT CHEST W/CM dated 12/25/2012; DG CHEST 2 VIEW dated 01/15/2012  FINDINGS: The lungs are hyperinflated likely secondary to COPD. There is right suprahilar spiculated airspace opacity with upward retraction of the hilum consistent with post treatment changes. There are bilateral chronic interstitial changes. There is a slight more focal right lower lobe airspace opacity. Stable cardiomediastinal silhouette.  The osseous structures are unremarkable.  IMPRESSION: 1. Subtle right lower lobe airspace opacity which may reflect developing pneumonia versus atelectasis. 2. Right suprahilar spiculated airspace opacity  consistent with post treatment changes. Recurrent or residual malignancy cannot be excluded.   Electronically Signed   By: Kathreen Devoid   On: 04/01/2013 17:30   Dg Pelvis 1-2 Views  04/01/2013   CLINICAL DATA:  Pelvic pain  EXAM: PELVIS - 1-2 VIEW  COMPARISON:  None.  FINDINGS: No acute fracture is noted. The pelvic ring is intact. Mild vascular calcifications are seen. No soft tissue changes are noted.  IMPRESSION: No acute abnormality seen.   Electronically Signed   By: Inez Catalina M.D.   On: 04/01/2013 17:28   Ct Head Wo  Contrast  04/01/2013   CLINICAL DATA:  Altered mental status, drug overdose  EXAM: CT HEAD WITHOUT CONTRAST  CT CERVICAL SPINE WITHOUT CONTRAST  TECHNIQUE: Multidetector CT imaging of the head and cervical spine was performed following the standard protocol without intravenous contrast. Multiplanar CT image reconstructions of the cervical spine were also generated.  COMPARISON:  CT head dated 06/05/2012  FINDINGS: CT HEAD FINDINGS  No evidence of parenchymal hemorrhage or extra-axial fluid collection. No mass lesion, mass effect, or midline shift.  No CT evidence of acute infarction.  Subcortical white matter and periventricular small vessel ischemic changes. Intracranial atherosclerosis.  Global cortical atrophy, possibly age appropriate. No ventriculomegaly.  Partial opacification of the right maxillary sinus, chronic. Mild mucosal thickening of the left maxillary sinus. Chronic opacification of the right mastoid air cells.  No evidence of calvarial fracture.  CT CERVICAL SPINE FINDINGS  Normal cervical lordosis.  No evidence of fracture or dislocation. Vertebral body heights are maintained. Dens appears intact.  No prevertebral soft tissue swelling.  Moderate multilevel degenerative changes, most prominent at C4-5 and C5-6.  Visualized thyroid is unremarkable.  Visualized lung apices are notable for right apical pleural parenchymal scarring and emphysematous changes.  IMPRESSION: No evidence of acute intracranial abnormality. Atrophy with small vessel ischemic changes and intracranial atherosclerosis.  No evidence of traumatic injury to the cervical spine. Moderate multilevel degenerative changes.   Electronically Signed   By: Julian Hy M.D.   On: 04/01/2013 17:44   Ct Cervical Spine Wo Contrast  04/01/2013   CLINICAL DATA:  Altered mental status, drug overdose  EXAM: CT HEAD WITHOUT CONTRAST  CT CERVICAL SPINE WITHOUT CONTRAST  TECHNIQUE: Multidetector CT imaging of the head and cervical spine was  performed following the standard protocol without intravenous contrast. Multiplanar CT image reconstructions of the cervical spine were also generated.  COMPARISON:  CT head dated 06/05/2012  FINDINGS: CT HEAD FINDINGS  No evidence of parenchymal hemorrhage or extra-axial fluid collection. No mass lesion, mass effect, or midline shift.  No CT evidence of acute infarction.  Subcortical white matter and periventricular small vessel ischemic changes. Intracranial atherosclerosis.  Global cortical atrophy, possibly age appropriate. No ventriculomegaly.  Partial opacification of the right maxillary sinus, chronic. Mild mucosal thickening of the left maxillary sinus. Chronic opacification of the right mastoid air cells.  No evidence of calvarial fracture.  CT CERVICAL SPINE FINDINGS  Normal cervical lordosis.  No evidence of fracture or dislocation. Vertebral body heights are maintained. Dens appears intact.  No prevertebral soft tissue swelling.  Moderate multilevel degenerative changes, most prominent at C4-5 and C5-6.  Visualized thyroid is unremarkable.  Visualized lung apices are notable for right apical pleural parenchymal scarring and emphysematous changes.  IMPRESSION: No evidence of acute intracranial abnormality. Atrophy with small vessel ischemic changes and intracranial atherosclerosis.  No evidence of traumatic injury to the cervical spine. Moderate multilevel degenerative changes.  Electronically Signed   By: Julian Hy M.D.   On: 04/01/2013 17:44   Dg Foot Complete Left  04/01/2013   CLINICAL DATA:  Recent fifth digit amputation  EXAM: LEFT FOOT - COMPLETE 3+ VIEW  COMPARISON:  02/25/2013, 03/05/2013  FINDINGS: Patient is status post right fifth digit amputation. Overlying soft tissue wound/ulceration. Bones are osteopenic compared to the prior study, suspect disuse. Other toes appear intact. No acute fracture.  IMPRESSION: Status post right fifth digit amputation with an overlying soft tissue  wound/ ulceration. .  Disuse osteopenia  No definite acute finding by plain radiography   Electronically Signed   By: Daryll Brod M.D.   On: 04/01/2013 22:00   Medications: I have reviewed the patient's current medications. Scheduled Meds: . ipratropium-albuterol  3 mL Nebulization Q12H  . phenytoin (DILANTIN) IV  1,000 mg Intravenous Once  . phenytoin  200 mg Oral BID  . silver sulfADIAZINE   Topical Daily  . sodium chloride  3 mL Intravenous Q12H  . vancomycin  500 mg Intravenous Q12H  . vancomycin  1,000 mg Intravenous NOW  . warfarin  7.5 mg Oral ONCE-1800  . Warfarin - Pharmacist Dosing Inpatient   Does not apply q1800   Continuous Infusions: . dextrose 5 % and 0.45% NaCl     PRN Meds:.acetaminophen, acetaminophen, albuterol, hydrocortisone Assessment/Plan: #Syncope:  Etiology remains unclear: seizure vs drug overdose vs arrythmia vs PE. Patient has seizure history (15-20 years ago), reports noncompliance with Dilantin, sub-therapeutic level on admission.  He was loaded on Dilantin in ED and given dose this morning, but after discussion will transition to [place].  Of note, he responded significantly to Narcan which points to likely narcotic overdose but UDS negative; he has reportedly been taking Vicodin for pain after recent left 5th toe amputation, reports taking this as prescribed and only when needed.  PE possible given his malignancy, history of PE and subtherapeutic INR.  Well's score of 3 (moderate risk).  CT head and C-spine negative.  Patient denies EtOH use or elicit drug use.  ASA, Tylenol and EtOH levels normal.  Troponins x 3 negative.  Orthostatics positive on 3/13. -continue telemetry -NS 500 cc bolus, repeat orthostatics tomorrow (patient was also NPO overnight, now eating) -Keppra 500 mg BID starting tonight -echo today  -neuro checks q4h  -fall precautions, seizure precautions; no driving for 6 months -BMP in AM   #CAP: ?RLL PNA on CXR. WBC 14.3 --> 7.8.  Patient says he has felt chest congestion the past few days and coughing up whitish mucous.  He denies fevers/chills or increased dyspnea.  -start levoquin 750 mg daily x 7-14 days -sputum culture  -supplemental oxygen prn  -duonebs   #Chronic osteomyelitis of left foot and left foot cellulitis- Patient has non-healing wound at site of left fifth toe amputation.  Says he goes to wound care once per week and puts Silvadene on at home. Foot tender to palpation. No acute findings on xray.  -ortho consult, appreciate recs; requested ABI, no cellulitis, follow-up outpatient -ABI -levoquin per above -blood cultures pending -CBC in AM   #Stage 3A non-small cell lung CA: Dx Nov 2005 s/p chemo and XRT (last dose in March 2006); recurrence in Feb 2007 s/p chemo (last dose Dec 2007) with stable disease since that time.  Followed by Dr. Earlie Server, currently under observation (since 2007). CXR showed suprahilar spiculated airspace opacity consistent with post-treatment changes; recurrent or residual malignancy cannot be excluded. Due for repeat chest CT  in June 2015.   #Hx of PE and DVT: PE incidentally found on CT chest in May 2006.  Also RLE DVT dx in August 2007.  He denies CP, increased dyspnea or lower extremity edema.  He reports compliance with Coumadin though INR is subtherapeutic.  -continue Coumadin per pharmacy  -daily INR   #History of hypertension: Stable, mainly low-normotensive this admission.  No home meds.   Dispo: Disposition is deferred at this time, awaiting improvement of current medical problems.  Anticipated discharge in approximately 1-2 day(s).   The patient does have a current PCP Bartholomew Crews, MD) and does need an [facility] hospital follow-up appointment after discharge.   .Services Needed at time of discharge: Y = Yes, Blank = No PT:   OT:   RN:   Equipment:   Other:     LOS: 1 day   Ivin Poot, MD 04/02/2013, 7:12 AM
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
## 2013-04-02 NOTE — Progress Notes (Signed)
Patient refuses bed alarm and socks. The risks and benefits were explained and patient expressed understanding but still refuses to have them on.

## 2013-04-02 NOTE — Progress Notes (Signed)
Paul [name] called about pt status. Pt refusing lab draws, tele, and bed alarms. No new orders obtained.

## 2013-04-02 NOTE — Progress Notes (Signed)
Patients refuses a new IV claiming he has been stuck "too many times" today. He refuses to be on bed rest also. The possible risks were explained and patient expressed understanding. He refused any lab to be drawn too. MD notified.

## 2013-04-02 NOTE — Progress Notes (Signed)
VASCULAR LAB PRELIMINARY  ARTERIAL  ABI completed:  Right ABI normal but waveforms abnormal.  Left ABI in the moderate decrease range.    RIGHT    LEFT    PRESSURE WAVEFORM  PRESSURE WAVEFORM  BRACHIAL IV Triphasic  BRACHIAL 113 Triphasic   DP 95 Monophasic  DP 51 Dampened monophasic   AT   AT    PT 116 Monophasic  PT 66 Dampened monophasic   PER   PER    GREAT TOE  NA GREAT TOE  NA    RIGHT LEFT  ABI 1.03 0.58     Paul [name], RVT 04/02/2013, 1:10 PM
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
## 2013-04-02 NOTE — Consult Note (Signed)
Reason for Consult: Chronic osteomyelitis left foot fifth metatarsal head status post left little toe amputation Referring Physician: Dr Eulogio Ditch is an 80 y.o. male.  HPI: Patient is an 80 year old gentleman who is several weeks status post amputation of the left little toe he was admitted for multiple medical problems and is seen in consultation for the nonhealing wound over the fifth metatarsal head.  Past Medical History  Diagnosis Date  . Hypertension   . COPD (chronic obstructive pulmonary disease)   . Hemorrhoids     external  . 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 GERD (gastroesophageal reflux disease)   . Emphysema   . HOH (hard of hearing)   . Non-small cell carcinoma of lung dx'd 2006    stage 3  . Lung cancer   . Pneumonia   . Seizures     Past Surgical History  Procedure Laterality Date  . Inner ear surgery    . Toe amputation Left     History reviewed. No pertinent family history.  Social History:  reports that he has been smoking.  He does not have any smokeless tobacco history on file. He reports that he does not drink alcohol or use illicit drugs.  Allergies: No Known Allergies  Medications: I have reviewed the patient's current medications.  Results for orders placed during the hospital encounter of 04/01/13 (from the past 48 hour(s))  STREP PNEUMONIAE URINARY ANTIGEN     Status: None   Collection Time    03/31/13  5:05 PM      Result Value Ref Range   Strep Pneumo Urinary Antigen NEGATIVE  NEGATIVE   Comment:            Infection due to S. pneumoniae     cannot be absolutely ruled out     since the antigen present     may be below the detection limit     of the test.  URINALYSIS, ROUTINE W REFLEX MICROSCOPIC     Status: Abnormal   Collection Time    04/01/13  5:05 PM      Result Value Ref Range   Color, Urine YELLOW  YELLOW   APPearance CLEAR  CLEAR   Specific Gravity, Urine 1.020  1.005 - 1.030   pH 5.5  5.0 -  8.0   Glucose, UA NEGATIVE  NEGATIVE mg/dL   Hgb urine dipstick NEGATIVE  NEGATIVE   Bilirubin Urine NEGATIVE  NEGATIVE   Ketones, ur NEGATIVE  NEGATIVE mg/dL   Protein, ur 30 (*) NEGATIVE mg/dL   Urobilinogen, UA 0.2  0.0 - 1.0 mg/dL   Nitrite NEGATIVE  NEGATIVE   Leukocytes, UA NEGATIVE  NEGATIVE  URINE MICROSCOPIC-ADD ON     Status: Abnormal   Collection Time    04/01/13  5:05 PM      Result Value Ref Range   Squamous Epithelial / LPF RARE  RARE   RBC / HPF 0-2  <3 RBC/hpf   Bacteria, UA RARE  RARE   Casts HYALINE CASTS (*) NEGATIVE   Sperm, UA PRESENT    COMPREHENSIVE METABOLIC PANEL     Status: Abnormal   Collection Time    04/01/13  5:49 PM      Result Value Ref Range   Sodium 136 (*) 137 - 147 mEq/L   Potassium 4.1  3.7 - 5.3 mEq/L   Chloride 96  96 - 112 mEq/L   CO2 24  19 -  32 mEq/L   Glucose, Bld 128 (*) 70 - 99 mg/dL   BUN 8  6 - 23 mg/dL   Creatinine, Ser 0.68  0.50 - 1.35 mg/dL   Calcium 9.5  8.4 - 10.5 mg/dL   Total Protein 7.5  6.0 - 8.3 g/dL   Albumin 3.9  3.5 - 5.2 g/dL   AST 19  0 - 37 U/L   ALT 15  0 - 53 U/L   Alkaline Phosphatase 132 (*) 39 - 117 U/L   Total Bilirubin 0.3  0.3 - 1.2 mg/dL   GFR calc non Af Amer 88 (*) >90 mL/min   GFR calc Af Amer >90  >90 mL/min   Comment: (NOTE)     The eGFR has been calculated using the CKD EPI equation.     This calculation has not been validated in all clinical situations.     eGFR's persistently <90 mL/min signify possible Chronic Kidney     Disease.  CBC WITH DIFFERENTIAL     Status: Abnormal   Collection Time    04/01/13  5:49 PM      Result Value Ref Range   WBC 14.3 (*) 4.0 - 10.5 K/uL   RBC 5.44  4.22 - 5.81 MIL/uL   Hemoglobin 16.6  13.0 - 17.0 g/dL   HCT 47.4  39.0 - 52.0 %   MCV 87.1  78.0 - 100.0 fL   MCH 30.5  26.0 - 34.0 pg   MCHC 35.0  30.0 - 36.0 g/dL   RDW 13.1  11.5 - 15.5 %   Platelets 259  150 - 400 K/uL   Neutrophils Relative % 90 (*) 43 - 77 %   Neutro Abs 12.9 (*) 1.7 - 7.7 K/uL    Lymphocytes Relative 4 (*) 12 - 46 %   Lymphs Abs 0.5 (*) 0.7 - 4.0 K/uL   Monocytes Relative 6  3 - 12 %   Monocytes Absolute 0.8  0.1 - 1.0 K/uL   Eosinophils Relative 0  0 - 5 %   Eosinophils Absolute 0.0  0.0 - 0.7 K/uL   Basophils Relative 0  0 - 1 %   Basophils Absolute 0.0  0.0 - 0.1 K/uL  LIPASE, BLOOD     Status: None   Collection Time    04/01/13  5:49 PM      Result Value Ref Range   Lipase 15  11 - 59 U/L  PROTIME-INR     Status: Abnormal   Collection Time    04/01/13  5:49 PM      Result Value Ref Range   Prothrombin Time 15.4 (*) 11.6 - 15.2 seconds   INR 1.25  0.00 - 1.49  I-STAT TROPOININ, ED     Status: None   Collection Time    04/01/13  6:00 PM      Result Value Ref Range   Troponin i, poc 0.02  0.00 - 0.08 ng/mL   Comment 3            Comment: Due to the release kinetics of cTnI,     a negative result within the first hours     of the onset of symptoms does not rule out     myocardial infarction with certainty.     If myocardial infarction is still suspected,     repeat the test at appropriate intervals.  I-STAT CG4 LACTIC ACID, ED     Status: Abnormal   Collection Time    04/01/13  6:02 PM      Result Value Ref Range   Lactic Acid, Venous 2.70 (*) 0.5 - 2.2 mmol/L  ACETAMINOPHEN LEVEL     Status: None   Collection Time    04/01/13  6:41 PM      Result Value Ref Range   Acetaminophen (Tylenol), Serum <15.0  10 - 30 ug/mL   Comment:            THERAPEUTIC CONCENTRATIONS VARY     SIGNIFICANTLY. A RANGE OF 10-30     ug/mL MAY BE AN EFFECTIVE     CONCENTRATION FOR MANY PATIENTS.     HOWEVER, SOME ARE BEST TREATED     AT CONCENTRATIONS OUTSIDE THIS     RANGE.     ACETAMINOPHEN CONCENTRATIONS     >150 ug/mL AT 4 HOURS AFTER     INGESTION AND >50 ug/mL AT 12     HOURS AFTER INGESTION ARE     OFTEN ASSOCIATED WITH TOXIC     REACTIONS.  SALICYLATE LEVEL     Status: Abnormal   Collection Time    04/01/13  6:41 PM      Result Value Ref Range    Salicylate Lvl <6.6 (*) 2.8 - 20.0 mg/dL  PHENYTOIN LEVEL, TOTAL     Status: Abnormal   Collection Time    04/01/13  6:41 PM      Result Value Ref Range   Phenytoin Lvl <2.5 (*) 10.0 - 20.0 ug/mL  URINE RAPID DRUG SCREEN (HOSP PERFORMED)     Status: None   Collection Time    04/01/13  6:58 PM      Result Value Ref Range   Opiates NONE DETECTED  NONE DETECTED   Cocaine NONE DETECTED  NONE DETECTED   Benzodiazepines NONE DETECTED  NONE DETECTED   Amphetamines NONE DETECTED  NONE DETECTED   Tetrahydrocannabinol NONE DETECTED  NONE DETECTED   Barbiturates NONE DETECTED  NONE DETECTED   Comment:            DRUG SCREEN FOR MEDICAL PURPOSES     ONLY.  IF CONFIRMATION IS NEEDED     FOR ANY PURPOSE, NOTIFY LAB     WITHIN 5 DAYS.                LOWEST DETECTABLE LIMITS     FOR URINE DRUG SCREEN     Drug Class       Cutoff (ng/mL)     Amphetamine      1000     Barbiturate      200     Benzodiazepine   440     Tricyclics       347     Opiates          300     Cocaine          300     THC              50  ETHANOL     Status: None   Collection Time    04/01/13  9:32 PM      Result Value Ref Range   Alcohol, Ethyl (B) <11  0 - 11 mg/dL   Comment:            LOWEST DETECTABLE LIMIT FOR     SERUM ALCOHOL IS 11 mg/dL     FOR MEDICAL PURPOSES ONLY  PROTIME-INR     Status: None   Collection Time  04/02/13  5:50 AM      Result Value Ref Range   Prothrombin Time 14.8  11.6 - 15.2 seconds   INR 1.19  0.00 - 1.49  CBC     Status: None   Collection Time    04/02/13  5:50 AM      Result Value Ref Range   WBC 7.8  4.0 - 10.5 K/uL   RBC 4.60  4.22 - 5.81 MIL/uL   Hemoglobin 13.9  13.0 - 17.0 g/dL   HCT 40.3  39.0 - 52.0 %   MCV 87.6  78.0 - 100.0 fL   MCH 30.2  26.0 - 34.0 pg   MCHC 34.5  30.0 - 36.0 g/dL   RDW 13.1  11.5 - 15.5 %   Platelets 220  150 - 400 K/uL  BASIC METABOLIC PANEL     Status: Abnormal   Collection Time    04/02/13  5:50 AM      Result Value Ref Range    Sodium 140  137 - 147 mEq/L   Potassium 4.5  3.7 - 5.3 mEq/L   Chloride 101  96 - 112 mEq/L   CO2 26  19 - 32 mEq/L   Glucose, Bld 96  70 - 99 mg/dL   BUN 6  6 - 23 mg/dL   Creatinine, Ser 0.68  0.50 - 1.35 mg/dL   Calcium 9.0  8.4 - 10.5 mg/dL   GFR calc non Af Amer 88 (*) >90 mL/min   GFR calc Af Amer >90  >90 mL/min   Comment: (NOTE)     The eGFR has been calculated using the CKD EPI equation.     This calculation has not been validated in all clinical situations.     eGFR's persistently <90 mL/min signify possible Chronic Kidney     Disease.  TROPONIN I     Status: None   Collection Time    04/02/13  5:50 AM      Result Value Ref Range   Troponin I <0.30  <0.30 ng/mL   Comment:            Due to the release kinetics of cTnI,     a negative result within the first hours     of the onset of symptoms does not rule out     myocardial infarction with certainty.     If myocardial infarction is still suspected,     repeat the test at appropriate intervals.    Dg Chest 2 View  04/01/2013   CLINICAL DATA:  Altered mental status  EXAM: CHEST  2 VIEW  COMPARISON:  CT CHEST W/CM dated 12/25/2012; DG CHEST 2 VIEW dated 01/15/2012  FINDINGS: The lungs are hyperinflated likely secondary to COPD. There is right suprahilar spiculated airspace opacity with upward retraction of the hilum consistent with post treatment changes. There are bilateral chronic interstitial changes. There is a slight more focal right lower lobe airspace opacity. Stable cardiomediastinal silhouette.  The osseous structures are unremarkable.  IMPRESSION: 1. Subtle right lower lobe airspace opacity which may reflect developing pneumonia versus atelectasis. 2. Right suprahilar spiculated airspace opacity consistent with post treatment changes. Recurrent or residual malignancy cannot be excluded.   Electronically Signed   By: Kathreen Devoid   On: 04/01/2013 17:30   Dg Pelvis 1-2 Views  04/01/2013   CLINICAL DATA:  Pelvic pain   EXAM: PELVIS - 1-2 VIEW  COMPARISON:  None.  FINDINGS: No acute fracture is noted. The pelvic  ring is intact. Mild vascular calcifications are seen. No soft tissue changes are noted.  IMPRESSION: No acute abnormality seen.   Electronically Signed   By: Inez Catalina M.D.   On: 04/01/2013 17:28   Ct Head Wo Contrast  04/01/2013   CLINICAL DATA:  Altered mental status, drug overdose  EXAM: CT HEAD WITHOUT CONTRAST  CT CERVICAL SPINE WITHOUT CONTRAST  TECHNIQUE: Multidetector CT imaging of the head and cervical spine was performed following the standard protocol without intravenous contrast. Multiplanar CT image reconstructions of the cervical spine were also generated.  COMPARISON:  CT head dated 06/05/2012  FINDINGS: CT HEAD FINDINGS  No evidence of parenchymal hemorrhage or extra-axial fluid collection. No mass lesion, mass effect, or midline shift.  No CT evidence of acute infarction.  Subcortical white matter and periventricular small vessel ischemic changes. Intracranial atherosclerosis.  Global cortical atrophy, possibly age appropriate. No ventriculomegaly.  Partial opacification of the right maxillary sinus, chronic. Mild mucosal thickening of the left maxillary sinus. Chronic opacification of the right mastoid air cells.  No evidence of calvarial fracture.  CT CERVICAL SPINE FINDINGS  Normal cervical lordosis.  No evidence of fracture or dislocation. Vertebral body heights are maintained. Dens appears intact.  No prevertebral soft tissue swelling.  Moderate multilevel degenerative changes, most prominent at C4-5 and C5-6.  Visualized thyroid is unremarkable.  Visualized lung apices are notable for right apical pleural parenchymal scarring and emphysematous changes.  IMPRESSION: No evidence of acute intracranial abnormality. Atrophy with small vessel ischemic changes and intracranial atherosclerosis.  No evidence of traumatic injury to the cervical spine. Moderate multilevel degenerative changes.    Electronically Signed   By: Julian Hy M.D.   On: 04/01/2013 17:44   Ct Cervical Spine Wo Contrast  04/01/2013   CLINICAL DATA:  Altered mental status, drug overdose  EXAM: CT HEAD WITHOUT CONTRAST  CT CERVICAL SPINE WITHOUT CONTRAST  TECHNIQUE: Multidetector CT imaging of the head and cervical spine was performed following the standard protocol without intravenous contrast. Multiplanar CT image reconstructions of the cervical spine were also generated.  COMPARISON:  CT head dated 06/05/2012  FINDINGS: CT HEAD FINDINGS  No evidence of parenchymal hemorrhage or extra-axial fluid collection. No mass lesion, mass effect, or midline shift.  No CT evidence of acute infarction.  Subcortical white matter and periventricular small vessel ischemic changes. Intracranial atherosclerosis.  Global cortical atrophy, possibly age appropriate. No ventriculomegaly.  Partial opacification of the right maxillary sinus, chronic. Mild mucosal thickening of the left maxillary sinus. Chronic opacification of the right mastoid air cells.  No evidence of calvarial fracture.  CT CERVICAL SPINE FINDINGS  Normal cervical lordosis.  No evidence of fracture or dislocation. Vertebral body heights are maintained. Dens appears intact.  No prevertebral soft tissue swelling.  Moderate multilevel degenerative changes, most prominent at C4-5 and C5-6.  Visualized thyroid is unremarkable.  Visualized lung apices are notable for right apical pleural parenchymal scarring and emphysematous changes.  IMPRESSION: No evidence of acute intracranial abnormality. Atrophy with small vessel ischemic changes and intracranial atherosclerosis.  No evidence of traumatic injury to the cervical spine. Moderate multilevel degenerative changes.   Electronically Signed   By: Julian Hy M.D.   On: 04/01/2013 17:44   Dg Foot Complete Left  04/01/2013   CLINICAL DATA:  Recent fifth digit amputation  EXAM: LEFT FOOT - COMPLETE 3+ VIEW  COMPARISON:   02/25/2013, 03/05/2013  FINDINGS: Patient is status post right fifth digit amputation. Overlying soft tissue wound/ulceration. Bones  are osteopenic compared to the prior study, suspect disuse. Other toes appear intact. No acute fracture.  IMPRESSION: Status post right fifth digit amputation with an overlying soft tissue wound/ ulceration. .  Disuse osteopenia  No definite acute finding by plain radiography   Electronically Signed   By: Daryll Brod M.D.   On: 04/01/2013 22:00    Review of Systems  All other systems reviewed and are negative.   Blood pressure 109/87, pulse 88, temperature 97.9 F (36.6 C), temperature source Oral, resp. rate 18, height 5' 10"  (1.778 m), weight 68 kg (149 lb 14.6 oz), SpO2 95.00%. Physical Exam On examination patient has very thin atrophic skin in his left foot. There is no cellulitis no adenopathy no red streaking no drainage. He does not have palpable pulses. There is a nonhealing wound approximately 2 cm in diameter over the metatarsal head. Radiographs were reviewed which shows complete destruction of the distal metatarsal head left foot fifth metatarsal secondary to chronic osteomyelitis. Assessment/Plan: Assessment severe peripheral vascular disease with amputation of the little toe left foot with chronic ostomy light and a Wagner grade 3 ulcer over the fifth metatarsal head.  Plan: I will request ankle-brachial indices. I will followup with the patient as an outpatient in my office. Patient will require further surgical intervention but would like to determine his vascular status to see if he could heal a fifth ray amputation.  Anorah Trias V 04/02/2013, 10:52 AM

## 2013-04-03 DIAGNOSIS — I517 Cardiomegaly: Secondary | ICD-10-CM

## 2013-04-03 MED ORDER — LEVETIRACETAM 500 MG PO TABS: 500.0000 mg | ORAL_TABLET | Freq: Two times a day (BID) | ORAL | Status: DC

## 2013-04-03 MED ORDER — ACETAMINOPHEN 325 MG PO TABS: 650.0000 mg | ORAL_TABLET | Freq: Four times a day (QID) | ORAL | Status: DC | PRN

## 2013-04-03 MED ORDER — LEVOFLOXACIN 750 MG PO TABS: 750.0000 mg | ORAL_TABLET | Freq: Every day | ORAL | Status: DC

## 2013-04-03 MED ORDER — WARFARIN SODIUM 7.5 MG PO TABS
7.5000 mg | ORAL_TABLET | Freq: Once | ORAL | Status: DC
  Filled 2013-04-03: qty 1

## 2013-04-03 MED ORDER — DICLOFENAC SODIUM 1 % TD GEL: 2.0000 g | Freq: Four times a day (QID) | TRANSDERMAL | Status: DC

## 2013-04-03 MED ORDER — ENSURE COMPLETE PO LIQD: 237.0000 mL | Freq: Two times a day (BID) | ORAL | Status: DC
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
## 2013-04-03 NOTE — Progress Notes (Signed)
Patient started walking down the hallway without signing paperwork. Paged Rosie Fate, MD regarding discharge paperwork and it was being completed. Printed off discharge summary and explained to patient about his medications and follow-up appointments. Patient anxious to leave and daughter confirmed with me that she is on her way to pick him up. Removed patient's IV in the hallway because he refused to go back to the room. Patient would not let me hold pressure despite the fact that he is on Coumadin. Patient was taken by wheelchair to main entrance A. Glade Nurse, RN

## 2013-04-03 NOTE — Progress Notes (Signed)
Subjective: Pt was semi-agitated overnight and responded well to seroquel. Having no new compliants just some ongoing ache in his left toe that was recently amputated.    Objective: Vital signs in last 24 hours: Filed Vitals:   04/02/13 0349 04/02/13 1151 04/02/13 1300 04/03/13 0343  BP: 109/87 98/77 134/59 121/72  Pulse: 88 85 86 99  Temp: 97.9 F (36.6 C) 98.2 F (36.8 C) 98.1 F (36.7 C) 98 F (36.7 C)  TempSrc: Oral Oral Oral Oral  Resp:    20  Height:      Weight: 149 lb 14.6 oz (68 kg)     SpO2: 95% 94% 95% 95%   Weight change:   Intake/Output Summary (Last 24 hours) at 04/03/13 1054 Last data filed at 04/03/13 0700  Gross per 24 hour  Intake  710.5 ml  Output    750 ml  Net  -39.5 ml    General: alert, cooperative, and in no apparent distress HEENT: NCAT, vision grossly intact, oropharynx clear and non-erythematous  Neck: supple, no lymphadenopathy Lungs: mild bilateral wheezing, normal work of respiration, no rales, ronchi Heart: regular rate and rhythm, no murmurs, gallops, or rubs Abdomen: soft, non-tender, non-distended, normal bowel sounds Extremities: left anterior leg erythematous and warm with non-healing ulcer at site of left 5th toe amputation; 2+ DP/PT pulses bilaterally, no cyanosis, clubbing, or edema Neurologic: alert & oriented X3, cranial nerves II-XII intact, strength grossly intact, sensation intact to light touch  Lab Results: Basic Metabolic Panel:  Recent Labs Lab 04/01/13 1749 04/02/13 0550  NA 136* 140  K 4.1 4.5  CL 96 101  CO2 24 26  GLUCOSE 128* 96  BUN 8 6  CREATININE 0.68 0.68  CALCIUM 9.5 9.0   Liver Function Tests:  Recent Labs Lab 04/01/13 1749  AST 19  ALT 15  ALKPHOS 132*  BILITOT 0.3  PROT 7.5  ALBUMIN 3.9    Recent Labs Lab 04/01/13 1749  LIPASE 15   CBC:  Recent Labs Lab 04/01/13 1749 04/02/13 0550  WBC 14.3* 7.8  NEUTROABS 12.9*  --   HGB 16.6 13.9  HCT 47.4 40.3  MCV 87.1 87.6  PLT 259  220   Cardiac Enzymes:  Recent Labs Lab 04/02/13 0550 04/02/13 1107  TROPONINI <0.30 <0.30   Coagulation:  Recent Labs Lab 04/01/13 1749 04/02/13 0550  LABPROT 15.4* 14.8  INR 1.25 1.19   Urine Drug Screen: Drugs of Abuse     Component Value Date/Time   LABOPIA NONE DETECTED 04/01/2013 1858   COCAINSCRNUR NONE DETECTED 04/01/2013 1858   LABBENZ NONE DETECTED 04/01/2013 1858   AMPHETMU NONE DETECTED 04/01/2013 1858   THCU NONE DETECTED 04/01/2013 1858   LABBARB NONE DETECTED 04/01/2013 1858    Alcohol Level:  Recent Labs Lab 04/01/13 2132  ETH <11   Urinalysis:  Recent Labs Lab 04/01/13 1705  COLORURINE YELLOW  LABSPEC 1.020  PHURINE 5.5  GLUCOSEU NEGATIVE  HGBUR NEGATIVE  BILIRUBINUR NEGATIVE  KETONESUR NEGATIVE  PROTEINUR 30*  UROBILINOGEN 0.2  NITRITE NEGATIVE  LEUKOCYTESUR NEGATIVE    Studies/Results: Dg Chest 2 View  04/01/2013   CLINICAL DATA:  Altered mental status  EXAM: CHEST  2 VIEW  COMPARISON:  CT CHEST W/CM dated 12/25/2012; DG CHEST 2 VIEW dated 01/15/2012  FINDINGS: The lungs are hyperinflated likely secondary to COPD. There is right suprahilar spiculated airspace opacity with upward retraction of the hilum consistent with post treatment changes. There are bilateral chronic interstitial changes. There is a slight  more focal right lower lobe airspace opacity. Stable cardiomediastinal silhouette.  The osseous structures are unremarkable.  IMPRESSION: 1. Subtle right lower lobe airspace opacity which may reflect developing pneumonia versus atelectasis. 2. Right suprahilar spiculated airspace opacity consistent with post treatment changes. Recurrent or residual malignancy cannot be excluded.   Electronically Signed   By: Kathreen Devoid   On: 04/01/2013 17:30   Dg Pelvis 1-2 Views  04/01/2013   CLINICAL DATA:  Pelvic pain  EXAM: PELVIS - 1-2 VIEW  COMPARISON:  None.  FINDINGS: No acute fracture is noted. The pelvic ring is intact. Mild vascular  calcifications are seen. No soft tissue changes are noted.  IMPRESSION: No acute abnormality seen.   Electronically Signed   By: Inez Catalina M.D.   On: 04/01/2013 17:28   Ct Head Wo Contrast  04/01/2013   CLINICAL DATA:  Altered mental status, drug overdose  EXAM: CT HEAD WITHOUT CONTRAST  CT CERVICAL SPINE WITHOUT CONTRAST  TECHNIQUE: Multidetector CT imaging of the head and cervical spine was performed following the standard protocol without intravenous contrast. Multiplanar CT image reconstructions of the cervical spine were also generated.  COMPARISON:  CT head dated 06/05/2012  FINDINGS: CT HEAD FINDINGS  No evidence of parenchymal hemorrhage or extra-axial fluid collection. No mass lesion, mass effect, or midline shift.  No CT evidence of acute infarction.  Subcortical white matter and periventricular small vessel ischemic changes. Intracranial atherosclerosis.  Global cortical atrophy, possibly age appropriate. No ventriculomegaly.  Partial opacification of the right maxillary sinus, chronic. Mild mucosal thickening of the left maxillary sinus. Chronic opacification of the right mastoid air cells.  No evidence of calvarial fracture.  CT CERVICAL SPINE FINDINGS  Normal cervical lordosis.  No evidence of fracture or dislocation. Vertebral body heights are maintained. Dens appears intact.  No prevertebral soft tissue swelling.  Moderate multilevel degenerative changes, most prominent at C4-5 and C5-6.  Visualized thyroid is unremarkable.  Visualized lung apices are notable for right apical pleural parenchymal scarring and emphysematous changes.  IMPRESSION: No evidence of acute intracranial abnormality. Atrophy with small vessel ischemic changes and intracranial atherosclerosis.  No evidence of traumatic injury to the cervical spine. Moderate multilevel degenerative changes.   Electronically Signed   By: Julian Hy M.D.   On: 04/01/2013 17:44   Ct Cervical Spine Wo Contrast  04/01/2013   CLINICAL  DATA:  Altered mental status, drug overdose  EXAM: CT HEAD WITHOUT CONTRAST  CT CERVICAL SPINE WITHOUT CONTRAST  TECHNIQUE: Multidetector CT imaging of the head and cervical spine was performed following the standard protocol without intravenous contrast. Multiplanar CT image reconstructions of the cervical spine were also generated.  COMPARISON:  CT head dated 06/05/2012  FINDINGS: CT HEAD FINDINGS  No evidence of parenchymal hemorrhage or extra-axial fluid collection. No mass lesion, mass effect, or midline shift.  No CT evidence of acute infarction.  Subcortical white matter and periventricular small vessel ischemic changes. Intracranial atherosclerosis.  Global cortical atrophy, possibly age appropriate. No ventriculomegaly.  Partial opacification of the right maxillary sinus, chronic. Mild mucosal thickening of the left maxillary sinus. Chronic opacification of the right mastoid air cells.  No evidence of calvarial fracture.  CT CERVICAL SPINE FINDINGS  Normal cervical lordosis.  No evidence of fracture or dislocation. Vertebral body heights are maintained. Dens appears intact.  No prevertebral soft tissue swelling.  Moderate multilevel degenerative changes, most prominent at C4-5 and C5-6.  Visualized thyroid is unremarkable.  Visualized lung apices are notable for right  apical pleural parenchymal scarring and emphysematous changes.  IMPRESSION: No evidence of acute intracranial abnormality. Atrophy with small vessel ischemic changes and intracranial atherosclerosis.  No evidence of traumatic injury to the cervical spine. Moderate multilevel degenerative changes.   Electronically Signed   By: Julian Hy M.D.   On: 04/01/2013 17:44   Dg Foot Complete Left  04/01/2013   CLINICAL DATA:  Recent fifth digit amputation  EXAM: LEFT FOOT - COMPLETE 3+ VIEW  COMPARISON:  02/25/2013, 03/05/2013  FINDINGS: Patient is status post right fifth digit amputation. Overlying soft tissue wound/ulceration. Bones are  osteopenic compared to the prior study, suspect disuse. Other toes appear intact. No acute fracture.  IMPRESSION: Status post right fifth digit amputation with an overlying soft tissue wound/ ulceration. .  Disuse osteopenia  No definite acute finding by plain radiography   Electronically Signed   By: Daryll Brod M.D.   On: 04/01/2013 22:00   Medications: I have reviewed the patient's current medications. Scheduled Meds: . feeding supplement (ENSURE COMPLETE)  237 mL Oral BID BM  . levETIRAcetam  500 mg Oral BID  . levofloxacin (LEVAQUIN) IV  750 mg Intravenous Q24H  . QUEtiapine  25 mg Oral QHS  . silver sulfADIAZINE   Topical Daily  . sodium chloride  3 mL Intravenous Q12H  . Warfarin - Pharmacist Dosing Inpatient   Does not apply q1800   Continuous Infusions:   PRN Meds:.acetaminophen, acetaminophen, albuterol, HYDROcodone-acetaminophen, hydrocortisone Assessment/Plan: #Syncope:  Etiology remains unclear and likely multifactorial: seizure vs drug overdose vs arrythmia vs PE. Patient has seizure history (15-20 years ago), reports noncompliance with Dilantin, sub-therapeutic level on admission.  He was loaded on Dilantin in ED and given dose this morning, but after discussion will transition to [place].  Of note, he responded significantly to Narcan which points to likely narcotic overdose but UDS negative; he has reportedly been taking Vicodin for pain after recent left 5th toe amputation, reports taking this as prescribed and only when needed.  PE possible given his malignancy, history of PE and subtherapeutic INR.  Well's score of 3 (moderate risk).  CT head and C-spine negative.  Patient denies EtOH use or elicit drug use.  ASA, Tylenol and EtOH levels normal.  Troponins x 3 negative.  Orthostatics positive on 3/13 and pt refused repeat values. -continue telemetry -Keppra 500 mg BID starting tonight -neuro checks q4h  -fall precautions, seizure precautions; no driving for 6  months  #CAP: ?RLL PNA on CXR. WBC 14.3 --> 7.8. Patient says he has felt chest congestion the past few days and coughing up whitish mucous.  He denies fevers/chills or increased dyspnea.  -start levoquin 750 mg daily x 7-14 days -sputum culture  -supplemental oxygen prn  -duonebs   #Chronic osteomyelitis of left foot and left foot cellulitis- Patient has non-healing wound at site of left fifth toe amputation.  Says he goes to wound care once per week and puts Silvadene on at home. Foot tender to palpation. No acute findings on xray.  -ortho consult, appreciate recs; requested ABI, no cellulitis, follow-up outpatient -ABI completed right 1.17 and left 0.79 -levoquin per above -blood cultures pending -CBC in AM   #Stage 3A non-small cell lung CA: Dx Nov 2005 s/p chemo and XRT (last dose in March 2006); recurrence in Feb 2007 s/p chemo (last dose Dec 2007) with stable disease since that time.  Followed by Dr. Earlie Server, currently under observation (since 2007). CXR showed suprahilar spiculated airspace opacity consistent with post-treatment  changes; recurrent or residual malignancy cannot be excluded. Due for repeat chest CT in June 2015.   #Hx of PE and DVT: PE incidentally found on CT chest in May 2006.  Also RLE DVT dx in August 2007.  He denies CP, increased dyspnea or lower extremity edema.  He reports compliance with Coumadin though INR is subtherapeutic.  -continue Coumadin per pharmacy  -daily INR   #History of hypertension: Stable, mainly low-normotensive this admission.  No home meds.  Dispo: Disposition is deferred at this time, awaiting improvement of current medical problems.  Anticipated discharge in approximately 1-2 day(s).   The patient does have a current PCP Billy Fischer, MD) and does need an [facility] hospital follow-up appointment after discharge.   .Services Needed at time of discharge: Y = Yes, Blank = No PT:   OT:   RN:   Equipment:   Other:     LOS: 2 days    Clinton Gallant, MD 04/03/2013, 10:54 AM

## 2013-04-03 NOTE — Discharge Instructions (Signed)
Dry dressing change left foot when necessary, may wash with soap and water daily.  PLEASE DO NOT DRIVE until a doctor tells you can do so. YOU ARE NOT SAFE TO DRIVE.   There are important medicine changes.  1. STOP all of your dilantin medicine 2. Start taking the Mier for your seizures  It is very important that you see Dr. Sharol Given for your foot.
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
## 2013-04-03 NOTE — ED Provider Notes (Signed)
I have personally seen and examined the patient.  I have discussed the plan of care with the resident.  I have reviewed the documentation on PMH/FH/Soc. History.  I have reviewed the documentation of the resident and agree.   Sharyon Cable, MD 04/03/13 [phone]

## 2013-04-03 NOTE — Progress Notes (Signed)
Echocardiogram 2D Echocardiogram has been performed.  Paul [name] 04/03/2013, 12:57 PM

## 2013-04-03 NOTE — Progress Notes (Signed)
Patient refused morning blood draws.  RN will continue to monitor.

## 2013-04-08 ENCOUNTER — Other Ambulatory Visit (HOSPITAL_COMMUNITY): Payer: Self-pay | Admitting: Orthopedic Surgery

## 2013-04-08 ENCOUNTER — Encounter (HOSPITAL_COMMUNITY): Payer: Self-pay | Admitting: Pharmacy Technician

## 2013-04-08 LAB — CULTURE, BLOOD (ROUTINE X 2)

## 2013-04-08 NOTE — Progress Notes (Signed)
I called patient's home number 4 times over the course of the evening; there was no answer or voice mail.  I call emergency number, Lovenia Shuck and asked if she had another number for patient.  Ms Nena Jordan is patient's sister, she does not have another number for patient.

## 2013-04-09 ENCOUNTER — Ambulatory Visit (HOSPITAL_COMMUNITY)
Admission: RE | Admit: 2013-04-09 | Discharge: 2013-04-09 | Disposition: A | Discharge status: Home / Self Care | Payer: Medicare Other | Source: Ambulatory Visit | Attending: Orthopedic Surgery | Admitting: Orthopedic Surgery

## 2013-04-09 ENCOUNTER — Encounter (HOSPITAL_COMMUNITY): Payer: Self-pay | Admitting: Certified Registered"

## 2013-04-09 ENCOUNTER — Encounter (HOSPITAL_COMMUNITY): Payer: Self-pay | Admitting: Surgery

## 2013-04-09 ENCOUNTER — Encounter (HOSPITAL_COMMUNITY)
Admission: RE | Disposition: A | Discharge status: Home / Self Care | Payer: Self-pay | Source: Ambulatory Visit | Attending: Orthopedic Surgery

## 2013-04-09 DIAGNOSIS — Z5309 Procedure and treatment not carried out because of other contraindication: Secondary | ICD-10-CM | POA: Insufficient documentation

## 2013-04-09 DIAGNOSIS — I739 Peripheral vascular disease, unspecified: Secondary | ICD-10-CM | POA: Insufficient documentation

## 2013-04-09 DIAGNOSIS — M86679 Other chronic osteomyelitis, unspecified ankle and foot: Secondary | ICD-10-CM | POA: Insufficient documentation

## 2013-04-09 DIAGNOSIS — L98499 Non-pressure chronic ulcer of skin of other sites with unspecified severity: Secondary | ICD-10-CM | POA: Insufficient documentation

## 2013-04-09 LAB — COMPREHENSIVE METABOLIC PANEL
ALT: 14 U/L (ref 0–53)
AST: 19 U/L (ref 0–37)
Albumin: 3.6 g/dL (ref 3.5–5.2)
Alkaline Phosphatase: 117 U/L (ref 39–117)
BUN: 9 mg/dL (ref 6–23)
CALCIUM: 9.2 mg/dL (ref 8.4–10.5)
CO2: 27 mEq/L (ref 19–32)
Chloride: 98 mEq/L (ref 96–112)
Creatinine, Ser: 0.68 mg/dL (ref 0.50–1.35)
GFR, EST NON AFRICAN AMERICAN: 88 mL/min — AB (ref >90)
GLUCOSE: 105 mg/dL — AB (ref 70–99)
Potassium: 4.7 mEq/L (ref 3.7–5.3)
Sodium: 137 mEq/L (ref 137–147)
Total Bilirubin: 0.2 mg/dL — ABNORMAL LOW (ref 0.3–1.2)
Total Protein: 6.9 g/dL (ref 6.0–8.3)

## 2013-04-09 LAB — CBC
HEMATOCRIT: 42 % (ref 39.0–52.0)
HEMOGLOBIN: 14.3 g/dL (ref 13.0–17.0)
MCH: 30.2 pg (ref 26.0–34.0)
MCHC: 34 g/dL (ref 30.0–36.0)
MCV: 88.6 fL (ref 78.0–100.0)
Platelets: 244 10*3/uL (ref 150–400)
RBC: 4.74 MIL/uL (ref 4.22–5.81)
RDW: 13.2 % (ref 11.5–15.5)
WBC: 6.9 10*3/uL (ref 4.0–10.5)

## 2013-04-09 LAB — PROTIME-INR
INR: 1.92 — ABNORMAL HIGH (ref 0.00–1.49)
Prothrombin Time: 21.4 seconds — ABNORMAL HIGH (ref 11.6–15.2)

## 2013-04-09 LAB — APTT: aPTT: 34 seconds (ref 24–37)

## 2013-04-09 SURGERY — AMPUTATION, FOOT, RAY
Anesthesia: General | Site: Foot | Laterality: Left

## 2013-04-09 MED ORDER — FENTANYL CITRATE 0.05 MG/ML IJ SOLN
INTRAMUSCULAR | Status: AC
  Filled 2013-04-09: qty 5

## 2013-04-09 MED ORDER — PROPOFOL 10 MG/ML IV BOLUS
INTRAVENOUS | Status: AC
  Filled 2013-04-09: qty 20

## 2013-04-09 MED ORDER — CEFAZOLIN SODIUM-DEXTROSE 2-3 GM-% IV SOLR: 2.0000 g | INTRAVENOUS | Status: DC

## 2013-04-09 SURGICAL SUPPLY — 28 items
BANDAGE GAUZE ELAST BULKY 4 IN (GAUZE/BANDAGES/DRESSINGS) ×3 IMPLANT
BLADE SAW SGTL MED 73X18.5 STR (BLADE) IMPLANT
BNDG COHESIVE 4X5 TAN STRL (GAUZE/BANDAGES/DRESSINGS) ×3 IMPLANT
COVER SURGICAL LIGHT HANDLE (MISCELLANEOUS) ×3 IMPLANT
DRAPE U-SHAPE 47X51 STRL (DRAPES) ×6 IMPLANT
DRSG ADAPTIC 3X8 NADH LF (GAUZE/BANDAGES/DRESSINGS) ×3 IMPLANT
DRSG PAD ABDOMINAL 8X10 ST (GAUZE/BANDAGES/DRESSINGS) ×6 IMPLANT
DURAPREP 26ML APPLICATOR (WOUND CARE) ×3 IMPLANT
ELECT REM PT RETURN 9FT ADLT (ELECTROSURGICAL) ×3
ELECTRODE REM PT RTRN 9FT ADLT (ELECTROSURGICAL) ×1 IMPLANT
GLOVE BIOGEL PI IND STRL 9 (GLOVE) ×1 IMPLANT
GLOVE BIOGEL PI INDICATOR 9 (GLOVE) ×2
GLOVE SURG ORTHO 9.0 STRL STRW (GLOVE) ×3 IMPLANT
GOWN STRL REUS W/ TWL XL LVL3 (GOWN DISPOSABLE) ×2 IMPLANT
GOWN STRL REUS W/TWL XL LVL3 (GOWN DISPOSABLE) ×4
KIT BASIN OR (CUSTOM PROCEDURE TRAY) ×3 IMPLANT
KIT ROOM TURNOVER OR (KITS) ×3 IMPLANT
NS IRRIG 1000ML POUR BTL (IV SOLUTION) ×3 IMPLANT
PACK ORTHO EXTREMITY (CUSTOM PROCEDURE TRAY) ×3 IMPLANT
PAD ARMBOARD 7.5X6 YLW CONV (MISCELLANEOUS) ×6 IMPLANT
SPONGE GAUZE 4X4 12PLY (GAUZE/BANDAGES/DRESSINGS) ×3 IMPLANT
SPONGE LAP 18X18 X RAY DECT (DISPOSABLE) ×3 IMPLANT
STOCKINETTE IMPERVIOUS LG (DRAPES) IMPLANT
SUT ETHILON 2 0 PSLX (SUTURE) ×6 IMPLANT
TOWEL OR 17X24 6PK STRL BLUE (TOWEL DISPOSABLE) ×3 IMPLANT
TOWEL OR 17X26 10 PK STRL BLUE (TOWEL DISPOSABLE) ×3 IMPLANT
UNDERPAD 30X30 INCONTINENT (UNDERPADS AND DIAPERS) ×3 IMPLANT
WATER STERILE IRR 1000ML POUR (IV SOLUTION) ×3 IMPLANT

## 2013-04-09 NOTE — Progress Notes (Signed)
Doris from [place] arrived at chairside and is currently arranging for patient to have transportation home.

## 2013-04-09 NOTE — Anesthesia Preprocedure Evaluation (Deleted)
Anesthesia Evaluation    Reviewed: Allergy & Precautions, H&P , NPO status , Patient's Chart, lab work & pertinent test results  History of Anesthesia Complications Negative for: history of anesthetic complications  Airway       Dental   Pulmonary COPD COPD inhaler, Current Smoker,  Lung Cancer         Cardiovascular hypertension, + Peripheral Vascular Disease     Neuro/Psych Seizures -, Well Controlled,  PSYCHIATRIC DISORDERS    GI/Hepatic GERD-  ,  Endo/Other    Renal/GU      Musculoskeletal   Abdominal   Peds  Hematology   Anesthesia Other Findings   Reproductive/Obstetrics                           Anesthesia Physical Anesthesia Plan  ASA: III  Anesthesia Plan: MAC   Post-op Pain Management:    Induction:   Airway Management Planned:   Additional Equipment:   Intra-op Plan:   Post-operative Plan:   Informed Consent:   Plan Discussed with: CRNA, Anesthesiologist and Surgeon  Anesthesia Plan Comments:         Anesthesia Quick Evaluation

## 2013-04-09 NOTE — Progress Notes (Signed)
20G IV removed from patients LFA. Patient preparing for discharge once his transportation arrives.

## 2013-04-09 NOTE — Progress Notes (Signed)
During pre-op Nurse questioned patient about last time he drank or consumed solid food. Patient stated "I drank a whole cup of coffee on the way over here and I ate [employer] at 1000....... No one told me I couldn't have anything...Marland KitchenMarland KitchenI woke up around 3 oclock this morning and thought maybe I shouldn't eat anything but I called my neighbor and he told me since they didn't tell me that I couldn't eat anything then it would be ok." Nurse explained to patient the importance of not eating or drinking after midnight.  Nurse then called Dr. Tobias Alexander (anesthesia) and informed him of this and Dr. Tobias Alexander requested that Nurse notify Dr. Sharol Given of this because his surgery needed to be cancelled. Nurse then called in [place] and spoke with Morey Hummingbird, CRNA and asked her to make Dr. Sharol Given aware of the situation. Dr. Sharol Given then stated that his office will call patient at home to reschedule surgery. Will inform patient of this.

## 2013-04-09 NOTE — Progress Notes (Signed)
Attempted to call Nolberto Hanlon  Related to pt needs transportation home.  Message left with Pamela's father to return call.  Message left with Pamela's work to call us when she gets to work

## 2013-04-09 NOTE — H&P (Signed)
Paul [name] is an 80 y.o. male.   Chief Complaint: Osteomyelitis abscess ulceration left foot fourth and fifth toes HPI: Patient is an 80 year old gentleman with peripheral vascular disease with chronic osteomyelitis of the left foot fourth and fifth rays.  Past Medical History  Diagnosis Date  . Hypertension   . COPD (chronic obstructive pulmonary disease)   . Hemorrhoids     external  . 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 GERD (gastroesophageal reflux disease)   . Emphysema   . HOH (hard of hearing)   . Non-small cell carcinoma of lung dx'd 2006    stage 3  . Lung cancer   . Pneumonia   . Seizures     Past Surgical History  Procedure Laterality Date  . Inner ear surgery    . Toe amputation Left     No family history on file. Social History:  reports that he has been smoking.  He does not have any smokeless tobacco history on file. He reports that he does not drink alcohol or use illicit drugs.  Allergies: No Known Allergies  No prescriptions prior to admission    No results found for this or any previous visit (from the past 48 hour(s)). No results found.  Review of Systems  All other systems reviewed and are negative.    There were no vitals taken for this visit. Physical Exam  On examination patient has ulceration with necrotic wound over the lateral column left foot. Patient is status post foot salvage intervention. Assessment/Plan Assessment progressive necrotic wound lateral aspect left foot.  Plan: We'll plan for left foot fourth and fifth ray amputation. Risks and benefits were discussed including nonhealing of the wound. Patient states he understands and wished to proceed at this time.  Paul [name] V 04/09/2013, 6:19 AM

## 2013-04-13 ENCOUNTER — Encounter: Payer: Medicare Other | Admitting: Internal Medicine

## 2013-04-15 ENCOUNTER — Encounter (HOSPITAL_COMMUNITY): Payer: Self-pay | Admitting: *Deleted

## 2013-04-16 ENCOUNTER — Ambulatory Visit (HOSPITAL_COMMUNITY): Payer: Medicare Other | Admitting: Anesthesiology

## 2013-04-16 ENCOUNTER — Encounter (HOSPITAL_COMMUNITY)
Admission: RE | Disposition: A | Discharge status: Home / Self Care | Payer: Self-pay | Source: Ambulatory Visit | Attending: Orthopedic Surgery

## 2013-04-16 ENCOUNTER — Encounter (HOSPITAL_COMMUNITY): Payer: Medicare Other | Admitting: Anesthesiology

## 2013-04-16 ENCOUNTER — Encounter (HOSPITAL_COMMUNITY): Payer: Self-pay | Admitting: *Deleted

## 2013-04-16 ENCOUNTER — Ambulatory Visit (HOSPITAL_COMMUNITY)
Admission: RE | Admit: 2013-04-16 | Discharge: 2013-04-16 | Disposition: A | Discharge status: Home / Self Care | Payer: Medicare Other | Source: Ambulatory Visit | Attending: Orthopedic Surgery | Admitting: Orthopedic Surgery

## 2013-04-16 DIAGNOSIS — L97509 Non-pressure chronic ulcer of other part of unspecified foot with unspecified severity: Secondary | ICD-10-CM | POA: Insufficient documentation

## 2013-04-16 DIAGNOSIS — F17200 Nicotine dependence, unspecified, uncomplicated: Secondary | ICD-10-CM | POA: Insufficient documentation

## 2013-04-16 DIAGNOSIS — I96 Gangrene, not elsewhere classified: Secondary | ICD-10-CM | POA: Insufficient documentation

## 2013-04-16 DIAGNOSIS — K644 Residual hemorrhoidal skin tags: Secondary | ICD-10-CM | POA: Insufficient documentation

## 2013-04-16 DIAGNOSIS — M869 Osteomyelitis, unspecified: Secondary | ICD-10-CM

## 2013-04-16 DIAGNOSIS — H9190 Unspecified hearing loss, unspecified ear: Secondary | ICD-10-CM | POA: Insufficient documentation

## 2013-04-16 DIAGNOSIS — I739 Peripheral vascular disease, unspecified: Secondary | ICD-10-CM | POA: Insufficient documentation

## 2013-04-16 DIAGNOSIS — I10 Essential (primary) hypertension: Secondary | ICD-10-CM | POA: Insufficient documentation

## 2013-04-16 DIAGNOSIS — Z7901 Long term (current) use of anticoagulants: Secondary | ICD-10-CM | POA: Insufficient documentation

## 2013-04-16 DIAGNOSIS — R569 Unspecified convulsions: Secondary | ICD-10-CM | POA: Insufficient documentation

## 2013-04-16 DIAGNOSIS — C3490 Malignant neoplasm of unspecified part of unspecified bronchus or lung: Secondary | ICD-10-CM | POA: Insufficient documentation

## 2013-04-16 DIAGNOSIS — K219 Gastro-esophageal reflux disease without esophagitis: Secondary | ICD-10-CM | POA: Insufficient documentation

## 2013-04-16 DIAGNOSIS — J438 Other emphysema: Secondary | ICD-10-CM | POA: Insufficient documentation

## 2013-04-16 HISTORY — PX: AMPUTATION: SHX166

## 2013-04-16 LAB — PROTIME-INR
INR: 2.54 — ABNORMAL HIGH (ref 0.00–1.49)
Prothrombin Time: 26.5 seconds — ABNORMAL HIGH (ref 11.6–15.2)

## 2013-04-16 SURGERY — AMPUTATION, FOOT, RAY
Anesthesia: General | Site: Foot | Laterality: Left

## 2013-04-16 MED ORDER — PROPOFOL 10 MG/ML IV BOLUS
INTRAVENOUS | Status: AC
  Filled 2013-04-16: qty 20

## 2013-04-16 MED ORDER — OXYCODONE HCL 5 MG/5ML PO SOLN
5.0000 mg | Freq: Once | ORAL | Status: AC | PRN
Start: 2013-04-16 — End: 2013-04-16

## 2013-04-16 MED ORDER — FENTANYL CITRATE 0.05 MG/ML IJ SOLN
INTRAMUSCULAR | Status: DC | PRN
  Administered 2013-04-16 (×5): 50 ug via INTRAVENOUS

## 2013-04-16 MED ORDER — OXYCODONE HCL 5 MG PO TABS
5.0000 mg | ORAL_TABLET | Freq: Once | ORAL | Status: AC | PRN
  Administered 2013-04-16: 5 mg via ORAL

## 2013-04-16 MED ORDER — FENTANYL CITRATE 0.05 MG/ML IJ SOLN
INTRAMUSCULAR | Status: AC
  Filled 2013-04-16: qty 5

## 2013-04-16 MED ORDER — LIDOCAINE HCL (CARDIAC) 20 MG/ML IV SOLN
INTRAVENOUS | Status: DC | PRN
  Administered 2013-04-16: 60 mg via INTRAVENOUS

## 2013-04-16 MED ORDER — MORPHINE SULFATE 2 MG/ML IJ SOLN
1.0000 mg | INTRAMUSCULAR | Status: DC | PRN
  Administered 2013-04-16 (×4): 2 mg via INTRAVENOUS

## 2013-04-16 MED ORDER — CEFAZOLIN SODIUM-DEXTROSE 2-3 GM-% IV SOLR: 2.0000 g | INTRAVENOUS | Status: DC

## 2013-04-16 MED ORDER — PROPOFOL 10 MG/ML IV BOLUS
INTRAVENOUS | Status: DC | PRN
  Administered 2013-04-16: 90 mg via INTRAVENOUS

## 2013-04-16 MED ORDER — CEFAZOLIN SODIUM-DEXTROSE 2-3 GM-% IV SOLR
INTRAVENOUS | Status: AC
  Administered 2013-04-16: 2 g via INTRAVENOUS
  Filled 2013-04-16: qty 50

## 2013-04-16 MED ORDER — 0.9 % SODIUM CHLORIDE (POUR BTL) OPTIME
TOPICAL | Status: DC | PRN
  Administered 2013-04-16: 1000 mL

## 2013-04-16 MED ORDER — ONDANSETRON HCL 4 MG/2ML IJ SOLN
INTRAMUSCULAR | Status: DC | PRN
  Administered 2013-04-16: 4 mg via INTRAVENOUS

## 2013-04-16 MED ORDER — MORPHINE SULFATE 2 MG/ML IJ SOLN
INTRAMUSCULAR | Status: AC
  Filled 2013-04-16: qty 1

## 2013-04-16 MED ORDER — MORPHINE SULFATE 4 MG/ML IJ SOLN
INTRAMUSCULAR | Status: AC
  Administered 2013-04-16 (×2): 2 mg
  Filled 2013-04-16: qty 1

## 2013-04-16 MED ORDER — LACTATED RINGERS IV SOLN
INTRAVENOUS | Status: DC
  Administered 2013-04-16: 09:00:00 via INTRAVENOUS

## 2013-04-16 MED ORDER — HYDROCODONE-ACETAMINOPHEN 5-325 MG PO TABS
ORAL_TABLET | ORAL | Status: AC
  Filled 2013-04-16: qty 1

## 2013-04-16 MED ORDER — LIDOCAINE HCL (CARDIAC) 20 MG/ML IV SOLN
INTRAVENOUS | Status: AC
  Filled 2013-04-16: qty 5

## 2013-04-16 MED ORDER — HYDROCODONE-ACETAMINOPHEN 5-325 MG PO TABS
1.0000 | ORAL_TABLET | Freq: Once | ORAL | Status: AC
  Administered 2013-04-16: 1 via ORAL

## 2013-04-16 MED ORDER — MORPHINE SULFATE 2 MG/ML IJ SOLN
INTRAMUSCULAR | Status: AC
  Administered 2013-04-16: 2 mg via INTRAVENOUS
  Filled 2013-04-16: qty 1

## 2013-04-16 MED ORDER — ONDANSETRON HCL 4 MG/2ML IJ SOLN
INTRAMUSCULAR | Status: AC
  Filled 2013-04-16: qty 2

## 2013-04-16 MED ORDER — OXYCODONE HCL 5 MG PO TABS
ORAL_TABLET | ORAL | Status: AC
  Filled 2013-04-16: qty 1

## 2013-04-16 MED ORDER — PROMETHAZINE HCL 25 MG/ML IJ SOLN
6.2500 mg | INTRAMUSCULAR | Status: DC | PRN
Start: 2013-04-16 — End: 2013-04-18

## 2013-04-16 MED ORDER — HYDROCODONE-ACETAMINOPHEN 5-325 MG PO TABS: 1.0000 | ORAL_TABLET | Freq: Four times a day (QID) | ORAL | Status: DC | PRN

## 2013-04-16 SURGICAL SUPPLY — 35 items
BLADE SAW SGTL MED 73X18.5 STR (BLADE) IMPLANT
BNDG COHESIVE 4X5 TAN STRL (GAUZE/BANDAGES/DRESSINGS) ×2 IMPLANT
BNDG GAUZE ELAST 4 BULKY (GAUZE/BANDAGES/DRESSINGS) ×2 IMPLANT
COVER SURGICAL LIGHT HANDLE (MISCELLANEOUS) ×2 IMPLANT
DRAPE U-SHAPE 47X51 STRL (DRAPES) ×2 IMPLANT
DRSG ADAPTIC 3X8 NADH LF (GAUZE/BANDAGES/DRESSINGS) ×2 IMPLANT
DRSG PAD ABDOMINAL 8X10 ST (GAUZE/BANDAGES/DRESSINGS) ×2 IMPLANT
DURAPREP 26ML APPLICATOR (WOUND CARE) ×2 IMPLANT
ELECT REM PT RETURN 9FT ADLT (ELECTROSURGICAL) ×2
ELECTRODE REM PT RTRN 9FT ADLT (ELECTROSURGICAL) ×1 IMPLANT
GLOVE BIOGEL PI IND STRL 7.5 (GLOVE) ×2 IMPLANT
GLOVE BIOGEL PI IND STRL 9 (GLOVE) ×1 IMPLANT
GLOVE BIOGEL PI INDICATOR 7.5 (GLOVE) ×2
GLOVE BIOGEL PI INDICATOR 9 (GLOVE) ×1
GLOVE ECLIPSE 7.0 STRL STRAW (GLOVE) ×2 IMPLANT
GLOVE SURG ORTHO 9.0 STRL STRW (GLOVE) ×2 IMPLANT
GLOVE SURG SS PI 7.0 STRL IVOR (GLOVE) ×2 IMPLANT
GLOVE SURG SS PI 7.5 STRL IVOR (GLOVE) ×2 IMPLANT
GOWN STRL REUS W/ TWL LRG LVL3 (GOWN DISPOSABLE) ×1 IMPLANT
GOWN STRL REUS W/ TWL XL LVL3 (GOWN DISPOSABLE) ×3 IMPLANT
GOWN STRL REUS W/TWL LRG LVL3 (GOWN DISPOSABLE) ×1
GOWN STRL REUS W/TWL XL LVL3 (GOWN DISPOSABLE) ×3
KIT BASIN OR (CUSTOM PROCEDURE TRAY) ×2 IMPLANT
KIT ROOM TURNOVER OR (KITS) ×2 IMPLANT
NS IRRIG 1000ML POUR BTL (IV SOLUTION) ×2 IMPLANT
PACK ORTHO EXTREMITY (CUSTOM PROCEDURE TRAY) ×2 IMPLANT
PAD ARMBOARD 7.5X6 YLW CONV (MISCELLANEOUS) ×4 IMPLANT
SPONGE GAUZE 4X4 12PLY (GAUZE/BANDAGES/DRESSINGS) ×2 IMPLANT
SPONGE LAP 18X18 X RAY DECT (DISPOSABLE) ×2 IMPLANT
STOCKINETTE IMPERVIOUS LG (DRAPES) IMPLANT
SUT ETHILON 2 0 PSLX (SUTURE) ×4 IMPLANT
TOWEL OR 17X24 6PK STRL BLUE (TOWEL DISPOSABLE) ×2 IMPLANT
TOWEL OR 17X26 10 PK STRL BLUE (TOWEL DISPOSABLE) ×2 IMPLANT
UNDERPAD 30X30 INCONTINENT (UNDERPADS AND DIAPERS) IMPLANT
WATER STERILE IRR 1000ML POUR (IV SOLUTION) IMPLANT

## 2013-04-16 NOTE — H&P (Signed)
Paul [name] is an 80 y.o. male.   Chief Complaint: Tomorrow as abscess ulceration left foot fourth and fifth toes HPI: Patient is an 80 year old gentleman with peripheral vascular disease with ischemic ulcer changes to the fourth and fifth toes left foot  Past Medical History  Diagnosis Date  . Hypertension   . COPD (chronic obstructive pulmonary disease)   . Hemorrhoids     external  . 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 GERD (gastroesophageal reflux disease)   . Emphysema   . HOH (hard of hearing)   . Non-small cell carcinoma of lung dx'd 2006    stage 3  . Lung cancer   . Pneumonia   . Seizures     Past Surgical History  Procedure Laterality Date  . Inner ear surgery    . Toe amputation Left   . Tonsillectomy      History reviewed. No pertinent family history. Social History:  reports that he has been smoking Cigarettes.  He has been smoking about 1.50 packs per day. He has never used smokeless tobacco. He reports that he does not drink alcohol or use illicit drugs.  Allergies: No Known Allergies  No prescriptions prior to admission    No results found for this or any previous visit (from the past 48 hour(s)). No results found.  Review of Systems  All other systems reviewed and are negative.    There were no vitals taken for this visit. Physical Exam  Examination patient does not have palpable pulses. There is ischemic changes with ulceration osteomyelitis left foot fourth and fifth toes Assessment/Plan Assessment: Ischemic ulceration left foot fourth and fifth toes.  Plan: We'll plan for left foot fourth and fifth ray amputation. Risks and benefits were discussed including infection neurovascular injury nonhealing wound need for additional surgery. Patient states he understands and wished to proceed at this time.  Alaina Donati V 04/16/2013, 6:47 AM

## 2013-04-16 NOTE — Anesthesia Postprocedure Evaluation (Signed)
  Anesthesia Post-op Note  Patient: Paul [name]  Procedure(s) Performed: Procedure(s) with comments: AMPUTATION RAY (Left) - Left Foot 4th and 5th Ray Amputation  Patient Location: PACU  Anesthesia Type:General  Level of Consciousness: awake and alert   Airway and Oxygen Therapy: Patient Spontanous Breathing  Post-op Pain: mild  Post-op Assessment: Post-op Vital signs reviewed  Post-op Vital Signs: stable  Complications: No apparent anesthesia complications

## 2013-04-16 NOTE — Anesthesia Preprocedure Evaluation (Addendum)
Anesthesia Evaluation  Patient identified by MRN, date of birth, ID band Patient awake    Reviewed: Allergy & Precautions, NPO status , Patient's Chart, lab work & pertinent test results  History of Anesthesia Complications Negative for: history of anesthetic complications  Airway Mallampati: I  Neck ROM: Full    Dental  (+) Edentulous Upper   Pulmonary COPDCurrent Smoker,    + decreased breath sounds      Cardiovascular hypertension, + Peripheral Vascular Disease Rhythm:Regular Rate:Normal     Neuro/Psych Seizures -,     GI/Hepatic GERD-  ,  Endo/Other    Renal/GU      Musculoskeletal   Abdominal   Peds  Hematology  (+) Blood dyscrasia, , On Coumadin   Anesthesia Other Findings   Reproductive/Obstetrics                         Anesthesia Physical Anesthesia Plan  ASA: III  Anesthesia Plan: General   Post-op Pain Management:    Induction: Intravenous  Airway Management Planned: LMA  Additional Equipment:   Intra-op Plan:   Post-operative Plan: Extubation in OR  Informed Consent: I have reviewed the patients History and Physical, chart, labs and discussed the procedure including the risks, benefits and alternatives for the proposed anesthesia with the patient or authorized representative who has indicated his/her understanding and acceptance.   Dental advisory given  Plan Discussed with: CRNA and Surgeon  Anesthesia Plan Comments:        Anesthesia Quick Evaluation

## 2013-04-16 NOTE — Transfer of Care (Signed)
Immediate Anesthesia Transfer of Care Note  Patient: Paul [name]  Procedure(s) Performed: Procedure(s) with comments: AMPUTATION RAY (Left) - Left Foot 4th and 5th Ray Amputation  Patient Location: PACU  Anesthesia Type:General  Level of Consciousness: awake, alert , patient cooperative and confused  Airway & Oxygen Therapy: Patient Spontanous Breathing and Patient connected to nasal cannula oxygen  Post-op Assessment: Report given to PACU RN and Post -op Vital signs reviewed and stable  Post vital signs: Reviewed and stable  Complications: No apparent anesthesia complications

## 2013-04-16 NOTE — Discharge Instructions (Signed)

## 2013-04-16 NOTE — Op Note (Signed)
OPERATIVE REPORT  DATE OF SURGERY: 04/16/2013  PATIENT:  Paul [name],  80 y.o. male  PRE-OPERATIVE DIAGNOSIS:  Osteomyelitis/Gangrene left fourth and fifth toe  POST-OPERATIVE DIAGNOSIS:  Osteomyelitis/Gangrene left fourth and fifth toe  PROCEDURE:  Procedure(s): AMPUTATION RAY left foot fourth and fifth rays  SURGEON:  Surgeon(s): Newt Minion, MD  ANESTHESIA:   general  EBL:  min ML  SPECIMEN:  No Specimen  TOURNIQUET:  * No tourniquets in log *  PROCEDURE DETAILS: Patient is an 80 year old gentleman with severe peripheral vascular disease smoking as well with ischemic wounds status post a fifth toe amputation left foot. Patient presents with progressive gangrenous changes laterally and presents for a fourth and fifth ray amputation. Risks and benefits were discussed including nonhealing wound potential for transtibial amputation. Patient states he understands and wished to proceed at this time. Description of procedure patient was brought to the operating room and underwent a general anesthetic. After adequate levels and anesthesia were obtained patient's left lower extremity was prepped using DuraPrep draped into a sterile field. A racquet incision was made around the fourth and fifth metatarsals. This was carried down to the base of the fourth and fifth metatarsals the was were resected in one block of tissue with the ulcer and metatarsals. The wound was irrigated with normal saline there was good bleeding after debridement. Incision was closed using 2-0 nylon. The wound was covered Adaptic orthopedic sponges AB dressing Kerlix and Coban. Patient was extubated taken to the PACU in stable condition.  PLAN OF CARE: Discharge to home after PACU  PATIENT DISPOSITION:  PACU - hemodynamically stable.   Newt Minion, MD 04/16/2013 12:45 PM

## 2013-04-20 ENCOUNTER — Encounter (HOSPITAL_COMMUNITY): Payer: Self-pay | Admitting: Orthopedic Surgery

## 2013-05-05 ENCOUNTER — Emergency Department (INDEPENDENT_AMBULATORY_CARE_PROVIDER_SITE_OTHER): Payer: Medicare Other

## 2013-05-05 ENCOUNTER — Emergency Department (INDEPENDENT_AMBULATORY_CARE_PROVIDER_SITE_OTHER)
Admission: EM | Admit: 2013-05-05 | Discharge: 2013-05-05 | Disposition: A | Discharge status: Home / Self Care | Payer: Medicare Other | Source: Home / Self Care

## 2013-05-05 ENCOUNTER — Encounter (HOSPITAL_COMMUNITY): Payer: Self-pay | Admitting: Emergency Medicine

## 2013-05-05 DIAGNOSIS — R918 Other nonspecific abnormal finding of lung field: Secondary | ICD-10-CM

## 2013-05-05 DIAGNOSIS — J309 Allergic rhinitis, unspecified: Secondary | ICD-10-CM

## 2013-05-05 MED ORDER — LEVOFLOXACIN 500 MG PO TABS: 500.0000 mg | ORAL_TABLET | Freq: Every day | ORAL | Status: DC

## 2013-05-05 MED ORDER — FLUTICASONE PROPIONATE 50 MCG/ACT NA SUSP: 2.0000 | Freq: Every day | NASAL | Status: DC

## 2013-05-05 MED ORDER — FEXOFENADINE HCL 60 MG PO TABS: 60.0000 mg | ORAL_TABLET | Freq: Two times a day (BID) | ORAL | Status: DC

## 2013-05-05 NOTE — ED Notes (Signed)
Late entry 9:17 am discharge instructions not this patient

## 2013-05-05 NOTE — Discharge Instructions (Signed)
Allergic Rhinitis Allergic rhinitis is when the mucous membranes in the nose respond to allergens. Allergens are particles in the air that cause your body to have an allergic reaction. This causes you to release allergic antibodies. Through a chain of events, these eventually cause you to release histamine into the blood stream. Although meant to protect the body, it is this release of histamine that causes your discomfort, such as frequent sneezing, congestion, and an itchy, runny nose.  CAUSES  Seasonal allergic rhinitis (hay fever) is caused by pollen allergens that may come from grasses, trees, and weeds. Year-round allergic rhinitis (perennial allergic rhinitis) is caused by allergens such as house dust mites, pet dander, and mold spores.  SYMPTOMS   Nasal stuffiness (congestion).  Itchy, runny nose with sneezing and tearing of the eyes. DIAGNOSIS  Your health care provider can help you determine the allergen or allergens that trigger your symptoms. If you and your health care provider are unable to determine the allergen, skin or blood testing may be used. TREATMENT  Allergic Rhinitis does not have a cure, but it can be controlled by:  Medicines and allergy shots (immunotherapy).  Avoiding the allergen. Hay fever may often be treated with antihistamines in pill or nasal spray forms. Antihistamines block the effects of histamine. There are over-the-counter medicines that may help with nasal congestion and swelling around the eyes. Check with your health care provider before taking or giving this medicine.  If avoiding the allergen or the medicine prescribed do not work, there are many new medicines your health care provider can prescribe. Stronger medicine may be used if initial measures are ineffective. Desensitizing injections can be used if medicine and avoidance does not work. Desensitization is when a patient is given ongoing shots until the body becomes less sensitive to the allergen.  Make sure you follow up with your health care provider if problems continue. HOME CARE INSTRUCTIONS It is not possible to completely avoid allergens, but you can reduce your symptoms by taking steps to limit your exposure to them. It helps to know exactly what you are allergic to so that you can avoid your specific triggers. SEEK MEDICAL CARE IF:   You have a fever.  You develop a cough that does not stop easily (persistent).  You have shortness of breath.  You start wheezing.  Symptoms interfere with normal daily activities. Document Released: 10/02/2000 Document Revised: 10/28/2012 Document Reviewed: 09/14/2012 [employer] Patient Information 2014 [employer].  

## 2013-05-05 NOTE — ED Provider Notes (Signed)
CSN: [id number]     Arrival date & time 05/05/13  0818 History   First MD Initiated Contact with Patient 05/05/13 [phone]     Chief Complaint  Patient presents with  . URI   (Consider location/radiation/quality/duration/timing/severity/associated sxs/prior Treatment) HPI Comments: 80 year old male with a 60-pack-year history of smoking presents with PND, nasal congestion, increased weakness, cough and malaise. The symptoms started in the past 2-3 days. He has a history of severe COPD, lung cancer, pneumonia, PVD, hypertension and seizures. He states that he called his pulmonologist and was advised to go to the urgent care to obtain a chest x-ray.   Past Medical History  Diagnosis Date  . Hypertension   . COPD (chronic obstructive pulmonary disease)   . Hemorrhoids     external  . 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 GERD (gastroesophageal reflux disease)   . Emphysema   . HOH (hard of hearing)   . Non-small cell carcinoma of lung dx'd 2006    stage 3  . Lung cancer   . Pneumonia   . Seizures    Past Surgical History  Procedure Laterality Date  . Inner ear surgery    . Toe amputation Left   . Tonsillectomy    . Amputation Left 04/16/2013    Procedure: AMPUTATION RAY;  Surgeon: Newt Minion, MD;  Location: [place];  Service: Orthopedics;  Laterality: Left;  Left Foot 4th and 5th Ray Amputation   No family history on file. History  Substance Use Topics  . Smoking status: Current Every Day Smoker -- 1.50 packs/day    Types: Cigarettes  . Smokeless tobacco: Never Used  . Alcohol Use: No    Review of Systems  Constitutional: Positive for activity change and fatigue. Negative for fever and diaphoresis.  HENT: Positive for congestion, postnasal drip, rhinorrhea, sinus pressure and trouble swallowing. Negative for ear pain, facial swelling and sore throat.   Eyes: Negative for pain, discharge and redness.  Respiratory: Positive for cough and shortness of breath.  Negative for chest tightness.   Cardiovascular: Negative.   Gastrointestinal: Positive for nausea. Negative for vomiting and diarrhea.  Genitourinary: Negative.   Musculoskeletal: Negative.  Negative for neck pain and neck stiffness.  Neurological: Negative.     Allergies  Review of patient's allergies indicates no known allergies.  Home Medications   Prior to Admission medications   Medication Sig Start Date End Date Taking? Authorizing Provider  acetaminophen (TYLENOL) 325 MG tablet Take 2 tablets (650 mg total) by mouth every 6 (six) hours as needed for mild pain (or Fever >/= 101). 04/03/13   Clinton Gallant, MD  albuterol (PROVENTIL HFA;VENTOLIN HFA) 108 (90 BASE) MCG/ACT inhaler Inhale 2 puffs into the lungs every 6 (six) hours as needed for wheezing or shortness of breath. 11/26/12   Gregor Hams, MD  feeding supplement, ENSURE COMPLETE, (ENSURE COMPLETE) LIQD Take 237 mLs by mouth 2 (two) times daily between meals. 04/03/13   Clinton Gallant, MD  HYDROcodone-acetaminophen (NORCO) 5-325 MG per tablet Take 1 tablet by mouth every 6 (six) hours as needed. 04/16/13   Newt Minion, MD  HYDROcodone-acetaminophen (NORCO/VICODIN) 5-325 MG per tablet Take 1 tablet by mouth every 6 (six) hours as needed. For pain 03/26/13   Historical Provider, MD  phenytoin (DILANTIN) 100 MG ER capsule Take 300 mg by mouth at bedtime and may repeat dose one time if needed.    Historical Provider, MD  warfarin (COUMADIN) 5  MG tablet Take 5 mg by mouth daily at 6 PM.    Historical Provider, MD   BP 108/62  Pulse 97  Temp(Src) 97.5 F (36.4 C) (Oral)  Resp 12  SpO2 100% Physical Exam  Nursing note and vitals reviewed. Constitutional: He is oriented to person, place, and time. He appears well-developed and well-nourished. No distress.  Does not appear acutely ill. Energetic speech and movement. No resp distress.  HENT:  Mouth/Throat: No oropharyngeal exudate.  OP with PND and minor erythema.  HOH  Eyes:  Conjunctivae and EOM are normal.  Neck: Normal range of motion. Neck supple.  Cardiovascular: Normal rate, regular rhythm and normal heart sounds.   Pulmonary/Chest: Effort normal. No respiratory distress. He has wheezes.  Very poor air movement and diminished BS throughout. Rare exp wheeze. Per hx expect this to be his baseline.  Musculoskeletal: Normal range of motion. He exhibits no edema.  Lymphadenopathy:    He has no cervical adenopathy.  Neurological: He is alert and oriented to person, place, and time. He exhibits normal muscle tone.  Skin: Skin is warm and dry. No rash noted.  Psychiatric: He has a normal mood and affect.    ED Course  Procedures (including critical care time) Labs Review Labs Reviewed - No data to display  Results for orders placed during the hospital encounter of 04/16/13  PROTIME-INR      Result Value Ref Range   Prothrombin Time 26.5 (*) 11.6 - 15.2 seconds   INR 2.54 (*) 0.00 - 1.49   Imaging Review Dg Chest 2 View  05/05/2013   CLINICAL DATA:  Sick for 2 weeks, cough, sore chest, smoker, hypertension, COPD, history non-small-cell lung cancer  EXAM: CHEST  2 VIEW  COMPARISON:  04/01/2013  FINDINGS: Normal heart size and pulmonary vascularity.  Small hiatal hernia.  Right upper lobe volume loss and opacity, in part related to known posttherapy changes in the right upper lobe from prior lung cancer.  However there appears to be increase in peripheral density in the right upper lobe adjacent to the minor fissure which could reflect a peripheral infiltrate/pneumonia.  Underlying emphysematous and bronchitic changes with right basilar scarring.  Mild infiltrate at lateral left base.  No gross pleural effusion or pneumothorax.  IMPRESSION: COPD changes with right basilar scarring.  Right upper lobe opacity corresponding to site of prior tumor therapy, question scarring, residual tumor not excluded.  Suspect mild right upper lobe and left basilar infiltrates.    Electronically Signed   By: Lavonia Dana M.D.   On: 05/05/2013 09:49     MDM   1. Allergic rhinitis   2. Pulmonary infiltrate in right lung on chest x-ray     Unclear as to etiology for infiltrates, noninfectious vs infectious. Due to chronic pulmonary disease, immunocompromized condition will tx with Levaquin 500 q d for 5 d Also allegra flonase nasal spray.      Janne Napoleon, NP 05/05/13 1010

## 2013-05-05 NOTE — ED Notes (Signed)
Provided coffee

## 2013-05-05 NOTE — ED Notes (Signed)
C/o 2 week history of runny nose, feel bad, and weakness, and c/o trouble breathing in am.  Patient speaking in complete sentences.  Patient has had 2 surgeries in the past week-left foot with toes amputated- 2 different episodes.

## 2013-05-06 NOTE — ED Provider Notes (Signed)
Medical screening examination/treatment/procedure(s) were performed by non-physician practitioner and as supervising physician I was immediately available for consultation/collaboration.  Philipp Deputy, M.D.  Harden Mo, MD 05/06/13 [phone]

## 2013-05-24 ENCOUNTER — Encounter (HOSPITAL_COMMUNITY): Payer: Self-pay | Admitting: Emergency Medicine

## 2013-05-24 ENCOUNTER — Emergency Department (HOSPITAL_COMMUNITY)
Admission: EM | Admit: 2013-05-24 | Discharge: 2013-05-24 | Disposition: A | Discharge status: Home / Self Care | Payer: Medicare Other | Attending: Emergency Medicine | Admitting: Emergency Medicine

## 2013-05-24 ENCOUNTER — Emergency Department (HOSPITAL_COMMUNITY): Payer: Medicare Other

## 2013-05-24 DIAGNOSIS — T8149XA Infection following a procedure, other surgical site, initial encounter: Secondary | ICD-10-CM

## 2013-05-24 DIAGNOSIS — Z7901 Long term (current) use of anticoagulants: Secondary | ICD-10-CM | POA: Insufficient documentation

## 2013-05-24 DIAGNOSIS — H9209 Otalgia, unspecified ear: Secondary | ICD-10-CM | POA: Insufficient documentation

## 2013-05-24 DIAGNOSIS — Z85118 Personal history of other malignant neoplasm of bronchus and lung: Secondary | ICD-10-CM | POA: Insufficient documentation

## 2013-05-24 DIAGNOSIS — H9202 Otalgia, left ear: Secondary | ICD-10-CM

## 2013-05-24 DIAGNOSIS — R42 Dizziness and giddiness: Secondary | ICD-10-CM | POA: Insufficient documentation

## 2013-05-24 DIAGNOSIS — G40209 Localization-related (focal) (partial) symptomatic epilepsy and epileptic syndromes with complex partial seizures, not intractable, without status epilepticus: Secondary | ICD-10-CM | POA: Insufficient documentation

## 2013-05-24 DIAGNOSIS — J320 Chronic maxillary sinusitis: Secondary | ICD-10-CM | POA: Insufficient documentation

## 2013-05-24 DIAGNOSIS — Z8701 Personal history of pneumonia (recurrent): Secondary | ICD-10-CM | POA: Insufficient documentation

## 2013-05-24 DIAGNOSIS — H9210 Otorrhea, unspecified ear: Secondary | ICD-10-CM | POA: Insufficient documentation

## 2013-05-24 DIAGNOSIS — T8140XA Infection following a procedure, unspecified, initial encounter: Secondary | ICD-10-CM | POA: Insufficient documentation

## 2013-05-24 DIAGNOSIS — M79672 Pain in left foot: Secondary | ICD-10-CM

## 2013-05-24 DIAGNOSIS — J438 Other emphysema: Secondary | ICD-10-CM | POA: Insufficient documentation

## 2013-05-24 DIAGNOSIS — H9190 Unspecified hearing loss, unspecified ear: Secondary | ICD-10-CM | POA: Insufficient documentation

## 2013-05-24 DIAGNOSIS — Y835 Amputation of limb(s) as the cause of abnormal reaction of the patient, or of later complication, without mention of misadventure at the time of the procedure: Secondary | ICD-10-CM | POA: Insufficient documentation

## 2013-05-24 DIAGNOSIS — Z8719 Personal history of other diseases of the digestive system: Secondary | ICD-10-CM | POA: Insufficient documentation

## 2013-05-24 DIAGNOSIS — F17200 Nicotine dependence, unspecified, uncomplicated: Secondary | ICD-10-CM | POA: Insufficient documentation

## 2013-05-24 DIAGNOSIS — M79609 Pain in unspecified limb: Secondary | ICD-10-CM | POA: Insufficient documentation

## 2013-05-24 DIAGNOSIS — I10 Essential (primary) hypertension: Secondary | ICD-10-CM | POA: Insufficient documentation

## 2013-05-24 LAB — BASIC METABOLIC PANEL
BUN: 9 mg/dL (ref 6–23)
CO2: 27 mEq/L (ref 19–32)
Calcium: 9 mg/dL (ref 8.4–10.5)
Chloride: 96 mEq/L (ref 96–112)
Creatinine, Ser: 0.64 mg/dL (ref 0.50–1.35)
GFR calc Af Amer: >90 mL/min (ref >90)
GFR calc non Af Amer: >90 mL/min (ref >90)
Glucose, Bld: 111 mg/dL — ABNORMAL HIGH (ref 70–99)
Potassium: 4.4 mEq/L (ref 3.7–5.3)
Sodium: 133 mEq/L — ABNORMAL LOW (ref 137–147)

## 2013-05-24 LAB — CBC
HCT: 43 % (ref 39.0–52.0)
Hemoglobin: 14.9 g/dL (ref 13.0–17.0)
MCH: 29.3 pg (ref 26.0–34.0)
MCHC: 34.7 g/dL (ref 30.0–36.0)
MCV: 84.6 fL (ref 78.0–100.0)
Platelets: 251 10*3/uL (ref 150–400)
RBC: 5.08 MIL/uL (ref 4.22–5.81)
RDW: 13.1 % (ref 11.5–15.5)
WBC: 6.8 10*3/uL (ref 4.0–10.5)

## 2013-05-24 LAB — SEDIMENTATION RATE: Sed Rate: 25 mm/hr — ABNORMAL HIGH (ref 0–16)

## 2013-05-24 MED ORDER — PIPERACILLIN-TAZOBACTAM 3.375 G IVPB
3.3750 g | Freq: Once | INTRAVENOUS | Status: AC
  Administered 2013-05-24: 3.375 g via INTRAVENOUS
  Filled 2013-05-24: qty 50

## 2013-05-24 MED ORDER — NICOTINE 21 MG/24HR TD PT24
21.0000 mg | MEDICATED_PATCH | Freq: Once | TRANSDERMAL | Status: DC
  Administered 2013-05-24: 21 mg via TRANSDERMAL
  Filled 2013-05-24: qty 1

## 2013-05-24 MED ORDER — CEFDINIR 300 MG PO CAPS: 300.0000 mg | ORAL_CAPSULE | Freq: Two times a day (BID) | ORAL | Status: DC

## 2013-05-24 MED ORDER — HYDROCODONE-ACETAMINOPHEN 5-325 MG PO TABS: 1.0000 | ORAL_TABLET | Freq: Four times a day (QID) | ORAL | Status: DC | PRN

## 2013-05-24 NOTE — ED Provider Notes (Signed)
CSN: [id number]     Arrival date & time 05/24/13  1147 History   First MD Initiated Contact with Patient 05/24/13 1354     Chief Complaint  Patient presents with  . Headache  . Fatigue     (Consider location/radiation/quality/duration/timing/severity/associated sxs/prior Treatment) HPI Pt is an 80yo male with hx of non-small cell carcinoma of lung (stage 3), seizures, COPD, HTN, hard of hearing and s/o left 5th toe amputation by Dr. Sharol Given on 04/16/13 for abscess ulceration of left foot, 4th and 5th toes due to PVD.  Pt has not f/u as scheduled with Dr. Sharol Given as he does not recall when he was suppose to have sutures removed. Sister has accompanied pt and states she is concerned he is not able to adequately take care of his surgical wounds and had requested a home health nurse. Sister states a home health nurse was never assigned. Today, pt requesting wound check of left foot but also c/o left ear pain associated with hearing loss, headache, and dizziness. Pt states he feels "off balance" when he gets up to walk and believes it is due to his left ear. Pt is concerned he might have an ear infection.  Denies fever, n/v/d. Denies recent falls or head trauma.  Past Medical History  Diagnosis Date  . Hypertension   . COPD (chronic obstructive pulmonary disease)   . Hemorrhoids     external  . 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 GERD (gastroesophageal reflux disease)   . Emphysema   . HOH (hard of hearing)   . Non-small cell carcinoma of lung dx'd 2006    stage 3  . Lung cancer   . Pneumonia   . Seizures    Past Surgical History  Procedure Laterality Date  . Inner ear surgery    . Toe amputation Left   . Tonsillectomy    . Amputation Left 04/16/2013    Procedure: AMPUTATION RAY;  Surgeon: Newt Minion, MD;  Location: [place];  Service: Orthopedics;  Laterality: Left;  Left Foot 4th and 5th Ray Amputation   History reviewed. No pertinent family history. History  Substance  Use Topics  . Smoking status: Current Every Day Smoker -- 1.50 packs/day for 60 years    Types: Cigarettes  . Smokeless tobacco: Never Used  . Alcohol Use: No    Review of Systems  HENT: Positive for ear discharge ( left), ear pain and hearing loss ( left). Negative for facial swelling, rhinorrhea, sinus pressure, sore throat, trouble swallowing and voice change.   Respiratory: Negative for shortness of breath.   Cardiovascular: Negative for chest pain.  Skin: Positive for wound ( left foot).  Neurological: Positive for dizziness, light-headedness and headaches. Negative for tremors, seizures, syncope, facial asymmetry, speech difficulty, weakness and numbness.  All other systems reviewed and are negative.     Allergies  Review of patient's allergies indicates no known allergies.  Home Medications   Prior to Admission medications   Medication Sig Start Date End Date Taking? Authorizing Provider  acetaminophen (TYLENOL) 325 MG tablet Take 2 tablets (650 mg total) by mouth every 6 (six) hours as needed for mild pain (or Fever >/= 101). 04/03/13  Yes Clinton Gallant, MD  HYDROcodone-acetaminophen (NORCO) 5-325 MG per tablet Take 1 tablet by mouth every 6 (six) hours as needed. 04/16/13  Yes Newt Minion, MD  phenytoin (DILANTIN) 100 MG ER capsule Take 300 mg by mouth at bedtime and may  repeat dose one time if needed.   Yes Historical Provider, MD  warfarin (COUMADIN) 5 MG tablet Take 5 mg by mouth daily at 6 PM.   Yes Historical Provider, MD   BP 136/70  Pulse 82  Temp(Src) 98.4 F (36.9 C) (Oral)  Resp 24  SpO2 100% Physical Exam  Nursing note and vitals reviewed. Constitutional: He is oriented to person, place, and time. He appears well-developed and well-nourished.  HENT:  Head: Normocephalic and atraumatic.  Right Ear: Hearing, tympanic membrane, external ear and ear canal normal.  Left Ear: Decreased hearing is noted.  Left side of face: mild erythema with diffuse scaling and  crusting scabs.    Eyes: Conjunctivae are normal. No scleral icterus.  Neck: Normal range of motion.  Cardiovascular: Normal rate, regular rhythm and normal heart sounds.   Pulmonary/Chest: Effort normal and breath sounds normal. No respiratory distress. He has no wheezes. He has no rales. He exhibits no tenderness.  Abdominal: Soft. Bowel sounds are normal. He exhibits no distension and no mass. There is no tenderness. There is no rebound and no guarding.  Musculoskeletal: Normal range of motion.  Neurological: He is alert and oriented to person, place, and time.  Skin: Skin is warm and dry.  Poorly healing surgical incision on left lateral foot s/p 5th toe removal.  Moist, yellow and white tissues present, no surrounding ecchymosis or erythema.  2 sutures in place. Mild tenderness along left 5th metatarsal.    ED Course  Procedures (including critical care time) Labs Review Labs Reviewed  BASIC METABOLIC PANEL - Abnormal; Notable for the following:    Sodium 133 (*)    Glucose, Bld 111 (*)    All other components within normal limits  SEDIMENTATION RATE - Abnormal; Notable for the following:    Sed Rate 25 (*)    All other components within normal limits  CBC    Imaging Review Dg Foot Complete Left  05/24/2013   CLINICAL DATA:  History of partial left foot amputation due to gangrene. Surgical site is erythematous and painful.  EXAM: LEFT FOOT - COMPLETE 3+ VIEW  COMPARISON:  DG FOOT COMPLETE*L* dated 04/01/2013  FINDINGS: Interval amputation of the fourth and fifth rays, at the proximal metatarsal shafts. There is cortical regularity at the surgical margin, especially along the residual fourth metatarsal.  IMPRESSION: 1. Partial amputations of the fourth and fifth rays with erosive changes along the surgical margin of the fourth metatarsal, worrisome for osteomyelitis. 2. Slight irregularity of the fifth metatarsal surgical margin, also worrisome for early osteomyelitis.   Electronically  Signed   By: Lorin Picket M.D.   On: 05/24/2013 15:13     EKG Interpretation   Date/Time:  Monday May 24 2013 13:23:32 EDT Ventricular Rate:  80 PR Interval:  166 QRS Duration: 127 QT Interval:  392 QTC Calculation: 452 R Axis:   81 Text Interpretation:  Sinus rhythm Right bundle branch block Nonspecific  ST abnormality No significant change since last tracing Confirmed by  POLLINA  MD, CHRISTOPHER [phone]) on 05/24/2013 1:30:40 PM      MDM   Final diagnoses:  Right maxillary sinusitis  Left ear pain  Left foot pain  Surgical wound infection    pt presenting for wound chest of left foot s/o 5th toe amputation by Dr. Sharol Given on 04/16/13. Today, wound found to be concerning for osteomyelitis of 4th and 5th metatarsal surgical margin.  Pt also c/o left ear pain and dizziness "off balance" with  ambulation.  Consult with Dr. Sharol Given about possible osteomyelitis.  Head CT pending. Will also get ESR to r/o temporal arteritis.   ESR: mildly elevated-25   Consulted with Dr. Sharol Given who recommended IV zosyn in ED and pt may be discharged home with PO antibiotics. Originally recommended doxycycline, however pt is on coumadin and does not have good f/u.  Will start pt on Omnicef. Advised to call Dr. Jess Barters office tomorrow to schedule a follow up appointment. Return precautions provided. Pt verbalized understanding and agreement with tx plan.     Noland Fordyce, PA-C 05/24/13 [phone]

## 2013-05-24 NOTE — ED Provider Notes (Signed)
Pt is currently receiving zosyn IV for possible osteomyelitis.  Will be able to be discharge once IV abx finished.  Care worker is currently working getting pt a walker but it will not be until 6pm.  Pt will stay until receive walker.    5:54 PM Pt stable for discharge.  I have filled out the face to face RN home health order as per request.    BP 136/70  Pulse 82  Temp(Src) 98.4 F (36.9 C) (Oral)  Resp 24  SpO2 100%  I have reviewed nursing notes and vital signs. I personally reviewed the imaging tests through PACS system  I reviewed available ER/hospitalization records thought the EMR  Results for orders placed during the hospital encounter of 05/24/13  CBC      Result Value Ref Range   WBC 6.8  4.0 - 10.5 K/uL   RBC 5.08  4.22 - 5.81 MIL/uL   Hemoglobin 14.9  13.0 - 17.0 g/dL   HCT 43.0  39.0 - 52.0 %   MCV 84.6  78.0 - 100.0 fL   MCH 29.3  26.0 - 34.0 pg   MCHC 34.7  30.0 - 36.0 g/dL   RDW 13.1  11.5 - 15.5 %   Platelets 251  150 - 400 K/uL  BASIC METABOLIC PANEL      Result Value Ref Range   Sodium 133 (*) 137 - 147 mEq/L   Potassium 4.4  3.7 - 5.3 mEq/L   Chloride 96  96 - 112 mEq/L   CO2 27  19 - 32 mEq/L   Glucose, Bld 111 (*) 70 - 99 mg/dL   BUN 9  6 - 23 mg/dL   Creatinine, Ser 0.64  0.50 - 1.35 mg/dL   Calcium 9.0  8.4 - 10.5 mg/dL   GFR calc non Af Amer >90  >90 mL/min   GFR calc Af Amer >90  >90 mL/min  SEDIMENTATION RATE      Result Value Ref Range   Sed Rate 25 (*) 0 - 16 mm/hr   Dg Chest 2 View  05/05/2013   CLINICAL DATA:  Sick for 2 weeks, cough, sore chest, smoker, hypertension, COPD, history non-small-cell lung cancer  EXAM: CHEST  2 VIEW  COMPARISON:  04/01/2013  FINDINGS: Normal heart size and pulmonary vascularity.  Small hiatal hernia.  Right upper lobe volume loss and opacity, in part related to known posttherapy changes in the right upper lobe from prior lung cancer.  However there appears to be increase in peripheral density in the right upper  lobe adjacent to the minor fissure which could reflect a peripheral infiltrate/pneumonia.  Underlying emphysematous and bronchitic changes with right basilar scarring.  Mild infiltrate at lateral left base.  No gross pleural effusion or pneumothorax.  IMPRESSION: COPD changes with right basilar scarring.  Right upper lobe opacity corresponding to site of prior tumor therapy, question scarring, residual tumor not excluded.  Suspect mild right upper lobe and left basilar infiltrates.   Electronically Signed   By: Lavonia Dana M.D.   On: 05/05/2013 09:49   Ct Head Wo Contrast  05/24/2013   CLINICAL DATA:  Generalized weakness for 3 weeks. Intermittent headaches.  EXAM: CT HEAD WITHOUT CONTRAST  TECHNIQUE: Contiguous axial images were obtained from the base of the skull through the vertex without intravenous contrast.  COMPARISON:  CT C SPINE W/O CM dated 04/01/2013; CT HEAD W/O CM dated 07/13/2011  FINDINGS: No mass lesion, mass effect, midline shift, hydrocephalus, hemorrhage. No acute  territorial cortical ischemia/infarct. Atrophy and chronic ischemic white matter disease is present. Frothy secretions are present within the right maxillary sinus which layer dependently. This can be associated with acute sinusitis. The appearance is little changed compared to prior exam. Calvarium appears intact. Bilateral mastoid fluid noted. . Left maxillary sinus disease appears resolved. Intracranial atherosclerosis.  IMPRESSION: 1. Atrophy and chronic ischemic white matter disease without acute intracranial abnormality. 2. Right maxillary sinus disease with frothy secretions. Fluid level can be associated with acute sinusitis in the appropriate clinical setting.   Electronically Signed   By: Dereck Ligas M.D.   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 Surgical site is erythematous and painful.  EXAM: LEFT FOOT - COMPLETE 3+ VIEW   COMPARISON:  DG FOOT COMPLETE*L* dated 04/01/2013  FINDINGS: Interval amputation of the fourth and fifth rays, at the proximal metatarsal shafts. There is cortical regularity at the surgical margin, especially along the residual fourth metatarsal.  IMPRESSION: 1. Partial amputations of the fourth and fifth rays with erosive changes along the surgical margin of the fourth metatarsal, worrisome for osteomyelitis. 2. Slight irregularity of the fifth metatarsal surgical margin, also worrisome for early osteomyelitis.   Electronically Signed   By: Lorin Picket M.D.   On: 05/24/2013 15:13      Domenic Moras, PA-C 05/24/13 1755

## 2013-05-24 NOTE — Progress Notes (Signed)
05/24/2013 Paul [name] RNCM 1738pm EDCM spoke to patient and his friend Paul [name] at bedside.  Patient's friend Paul [name] reports she will be calling private duty agencies and Tax adviser for patient. EDCM spoke to EDP who placed hoem health orders.  Paul [name] also reports she will be able to be at patient's house when home health agency comes. [facility] faxed home health orders to [place] at 1733pm with confirmation of recepit at 1737pm.  Patient and patient's friend aware of walker being delivered to patient's room in ED.  EDP aware patient waiting for a walker.  Informed patient's friend of the impotance of finding a pcp for patient.  Patient's friend Paul [name] reports she will be looking for a pcp for the patient immediately.  EDCM also informed patient's friend that patient will need follow up visit with Dr. Sharol Given.  Patient's friend Paul [name] reports she has the contact information and private number for Dr. Sharol Given and will contact him for follow up.   No further EDCM needs at this time.

## 2013-05-24 NOTE — ED Notes (Signed)
2 black sutures taken out of L foot.  Pt tolerated it very well.

## 2013-05-24 NOTE — Discharge Instructions (Signed)
Be sure to call to schedule follow up appointment with Dr. Sharol Given for continued management of left foot wound care. Return to ER for NEW or worsening symptoms including increased pain, fever, or unable to keep down fluids.

## 2013-05-24 NOTE — ED Notes (Signed)
Pt resting comfortably with family at bedside.  Waiting for admit MD.  Nicotine patch placed, pt states "I'd rather have a smoke", pt notified he cannot smoke in the facility, pt verbalized understanding.

## 2013-05-24 NOTE — ED Provider Notes (Signed)
I was available for consult during the completion of this patient's care.   Paul Muskrat, MD 05/24/13 [phone]

## 2013-05-24 NOTE — Progress Notes (Signed)
05/24/2013 A. Ra Pfiester RNCM 1837pm EDCM contacted patient's friend Olin Hauser who reports, "He had to leave because he was tired of waiting."  EDCM informed Olin Hauser that patient's walker was delivered to his home and was placed on the porch behind a white chair.  Patient's friend Olin Hauser reports she is driving there currently to get it.  Patient's friend informed EDCM in earlier conversation that patient's doctor at [facility] at  [facility] is Dr. Lorna Few.  No further EDCM needs at this time.

## 2013-05-24 NOTE — ED Notes (Signed)
Pt reports generalized weakness for past 3 weeks with intermittent headaches. Pt had procedure done on L side of face a week ago, pt states intermittent headaches started after that. Pt states he has dizzy spells at times.

## 2013-05-24 NOTE — ED Notes (Signed)
Case mgt was at bedside

## 2013-05-24 NOTE — ED Notes (Signed)
Pt waiting for walker to be delivered

## 2013-05-24 NOTE — Progress Notes (Signed)
05/24/2013 A. Lafe Clerk RNCM 1812pm EDCM asked patient's nurse if patient received his walker prior to discharge.  Patient's RN reports the patient's friend said it was going to be delivered to patient's home.  EDCM contacted [place] DME rep for [facility] who will deliver walker to patient's home.  No further EDCM needs at this time.

## 2013-05-24 NOTE — Progress Notes (Signed)
  CARE MANAGEMENT ED NOTE 05/24/2013  Patient:  Paul [name], Paul [name]   Account Number:  [id number]  Date Initiated:  05/24/2013  Documentation initiated by:  Livia Snellen  Subjective/Objective Assessment:   Patient presents to Ed with generalized weakness, intermittent headaches and wound check     Subjective/Objective Assessment Detail:     Action/Plan:   Action/Plan Detail:   Anticipated DC Date:  05/24/2013     Status Recommendation to Physician:   Result of Recommendation:    Other ED Services  Consult Working [place]  CM consult  Other  PCP issues    Choice offered to / List presented to:  C-1 Patient  DME arranged  [place]ford Rile     DME agency  [place] arranged  HH-1 RN      [place].    Status of service:    ED Comments:   ED Comments Detail:  EDCM spoke to patient at bedside. Patient lives alone. Patient reports he has a friend who with her family, check in on the patient frequently.  Patient does not have any home health services at this time.  Patient reports he is able to complete all of is ADL's without difficulty, but "I have to be careful when I walk.  I have to get me one of those walkers."  EDCM called [place] DME rep for [facility] who will bring walker to Ed around 6pm for patient.  Patient is agreeable to wait for walker.  Patient reports there will be someone at his house for teaching purposes when home health agency arrives.  [facility] provided patient with list of home health agencies, a printed private duty nursing list and printed information regarding the [employer] [facility] [place].  EDCM explained to patient that with home health, the patient may receive a visiting RN, PT, OT, aide and social worker if needed.  Patient agreeable to [facility] for home health services.  Also provided patient with list of pcps who accept Medicare insurance with in a 5-10 mile radius of patient's zip code.  Patient  reports, "I don't really have a family doctor."  Discussed patient with Erasmo Downer transition care specialist for [facility] who will accept patient.   Discussed patient with EDPA.

## 2013-05-26 NOTE — ED Provider Notes (Signed)
Medical screening examination/treatment/procedure(s) were conducted as a shared visit with non-physician practitioner(s) and myself.  I personally evaluated the patient during the encounter.   EKG Interpretation   Date/Time:  Monday May 24 2013 13:23:32 EDT Ventricular Rate:  80 PR Interval:  166 QRS Duration: 127 QT Interval:  392 QTC Calculation: 452 R Axis:   81 Text Interpretation:  Sinus rhythm Right bundle branch block Nonspecific  ST abnormality No significant change since last tracing Confirmed by  Oksana Deberry  MD, [place] [phone]) on 05/24/2013 1:30:40 PM      Patient presents to the ER for evaluation of possible infection to the surgical site on his foot. Also has been experiencing headache. Patient did undergo amputation on March 27, has not followup with his surgeon. It was a has not been performing dressing changes. He has not had followup for suture removal. There does appear to be infected, and no concern for osteomyelitis at this time. Initiate IV antibiotic therapy here in the ER at the request of Doctor Sharol Given, will be maintained on oral antibiotics as an outpatient, follow up in office.  Head CT negative. Sedimentation rate not elevated to the point where her temperatures would be considered.  Orpah Greek, MD 05/26/13 1800

## 2013-06-03 ENCOUNTER — Emergency Department (HOSPITAL_BASED_OUTPATIENT_CLINIC_OR_DEPARTMENT_OTHER)
Admission: EM | Admit: 2013-06-03 | Discharge: 2013-06-03 | Disposition: A | Discharge status: Home / Self Care | Payer: Medicare Other | Attending: Emergency Medicine | Admitting: Emergency Medicine

## 2013-06-03 ENCOUNTER — Encounter (HOSPITAL_BASED_OUTPATIENT_CLINIC_OR_DEPARTMENT_OTHER): Payer: Self-pay | Admitting: Emergency Medicine

## 2013-06-03 DIAGNOSIS — J449 Chronic obstructive pulmonary disease, unspecified: Secondary | ICD-10-CM | POA: Insufficient documentation

## 2013-06-03 DIAGNOSIS — Z8701 Personal history of pneumonia (recurrent): Secondary | ICD-10-CM | POA: Insufficient documentation

## 2013-06-03 DIAGNOSIS — Z792 Long term (current) use of antibiotics: Secondary | ICD-10-CM | POA: Insufficient documentation

## 2013-06-03 DIAGNOSIS — R51 Headache: Secondary | ICD-10-CM | POA: Insufficient documentation

## 2013-06-03 DIAGNOSIS — H9190 Unspecified hearing loss, unspecified ear: Secondary | ICD-10-CM | POA: Insufficient documentation

## 2013-06-03 DIAGNOSIS — R638 Other symptoms and signs concerning food and fluid intake: Secondary | ICD-10-CM | POA: Insufficient documentation

## 2013-06-03 DIAGNOSIS — H9202 Otalgia, left ear: Secondary | ICD-10-CM

## 2013-06-03 DIAGNOSIS — J4489 Other specified chronic obstructive pulmonary disease: Secondary | ICD-10-CM | POA: Insufficient documentation

## 2013-06-03 DIAGNOSIS — I10 Essential (primary) hypertension: Secondary | ICD-10-CM | POA: Insufficient documentation

## 2013-06-03 DIAGNOSIS — H9209 Otalgia, unspecified ear: Secondary | ICD-10-CM | POA: Insufficient documentation

## 2013-06-03 DIAGNOSIS — F17200 Nicotine dependence, unspecified, uncomplicated: Secondary | ICD-10-CM | POA: Insufficient documentation

## 2013-06-03 DIAGNOSIS — Z7901 Long term (current) use of anticoagulants: Secondary | ICD-10-CM | POA: Insufficient documentation

## 2013-06-03 DIAGNOSIS — G40209 Localization-related (focal) (partial) symptomatic epilepsy and epileptic syndromes with complex partial seizures, not intractable, without status epilepticus: Secondary | ICD-10-CM | POA: Insufficient documentation

## 2013-06-03 DIAGNOSIS — M542 Cervicalgia: Secondary | ICD-10-CM | POA: Insufficient documentation

## 2013-06-03 DIAGNOSIS — Z85118 Personal history of other malignant neoplasm of bronchus and lung: Secondary | ICD-10-CM | POA: Insufficient documentation

## 2013-06-03 MED ORDER — HYDROCODONE-ACETAMINOPHEN 5-325 MG PO TABS: 1.0000 | ORAL_TABLET | Freq: Four times a day (QID) | ORAL | Status: DC | PRN

## 2013-06-03 MED ORDER — NEOMYCIN-POLYMYXIN-HC 1 % OT SOLN
3.0000 [drp] | Freq: Four times a day (QID) | OTIC | Status: DC
  Administered 2013-06-03: 3 [drp] via OTIC
  Filled 2013-06-03: qty 10

## 2013-06-03 NOTE — Discharge Instructions (Signed)
Otalgia °The most common reason for this in children is an infection of the middle ear. Pain from the middle ear is usually caused by a build-up of fluid and pressure behind the eardrum. Pain from an earache can be sharp, dull, or burning. The pain may be temporary or constant. The middle ear is connected to the nasal passages by a short narrow tube called the Eustachian tube. The Eustachian tube allows fluid to drain out of the middle ear, and helps keep the pressure in your ear equalized. °CAUSES  °A cold or allergy can block the Eustachian tube with inflammation and the build-up of secretions. This is especially likely in small children, because their Eustachian tube is shorter and more horizontal. When the Eustachian tube closes, the normal flow of fluid from the middle ear is stopped. Fluid can accumulate and cause stuffiness, pain, hearing loss, and an ear infection if germs start growing in this area. °SYMPTOMS  °The symptoms of an ear infection may include fever, ear pain, fussiness, increased crying, and irritability. Many children will have temporary and minor hearing loss during and right after an ear infection. Permanent hearing loss is rare, but the risk increases the more infections a child has. Other causes of ear pain include retained water in the outer ear canal from swimming and bathing. °Ear pain in adults is less likely to be from an ear infection. Ear pain may be referred from other locations. Referred pain may be from the joint between your jaw and the skull. It may also come from a tooth problem or problems in the neck. Other causes of ear pain include: °· A foreign body in the ear. °· Outer ear infection. °· Sinus infections. °· Impacted ear wax. °· Ear injury. °· Arthritis of the jaw or TMJ problems. °· Middle ear infection. °· Tooth infections. °· Sore throat with pain to the ears. °DIAGNOSIS  °Your caregiver can usually make the diagnosis by examining you. Sometimes other special studies,  including x-rays and lab work may be necessary. °TREATMENT  °· If antibiotics were prescribed, use them as directed and finish them even if you or your child's symptoms seem to be improved. °· Sometimes PE tubes are needed in children. These are little plastic tubes which are put into the eardrum during a simple surgical procedure. They allow fluid to drain easier and allow the pressure in the middle ear to equalize. This helps relieve the ear pain caused by pressure changes. °HOME CARE INSTRUCTIONS  °· Only take over-the-counter or prescription medicines for pain, discomfort, or fever as directed by your caregiver. DO NOT GIVE CHILDREN ASPIRIN because of the association of Reye's Syndrome in children taking aspirin. °· Use a cold pack applied to the outer ear for 15-20 minutes, 03-04 times per day or as needed may reduce pain. Do not apply ice directly to the skin. You may cause frost bite. °· Over-the-counter ear drops used as directed may be effective. Your caregiver may sometimes prescribe ear drops. °· Resting in an upright position may help reduce pressure in the middle ear and relieve pain. °· Ear pain caused by rapidly descending from high altitudes can be relieved by swallowing or chewing gum. Allowing infants to suck on a bottle during airplane travel can help. °· Do not smoke in the house or near children. If you are unable to quit smoking, smoke outside. °· Control allergies. °SEEK IMMEDIATE MEDICAL CARE IF:  °· You or your child are becoming sicker. °· Pain or fever   relief is not obtained with medicine.  You or your child's symptoms (pain, fever, or irritability) do not improve within 24 to 48 hours or as instructed.  Severe pain suddenly stops hurting. This may indicate a ruptured eardrum.  You or your children develop new problems such as severe headaches, stiff neck, difficulty swallowing, or swelling of the face or around the ear. Document Released: 08/25/2003 Document Revised: 04/01/2011  Document Reviewed: 12/30/2007 [facility] Patient Information 2014 [place].

## 2013-06-03 NOTE — ED Provider Notes (Signed)
CSN: [id number]     Arrival date & time 06/03/13  1315 History   First MD Initiated Contact with Patient 06/03/13 1326     Chief Complaint  Patient presents with  . Otalgia     (Consider location/radiation/quality/duration/timing/severity/associated sxs/prior Treatment) HPI Comments: 80 year old with a h/o lung cancer presents today with right sided otalgia.  Pain is intermittent in the day, mostly occuring at night.  He feels a sharp stabbing pain in the ear that radiates down his jaw and sometimes into the shoulder when it comes on.  He has been taking norco for pain from a foot infection and that does not help the ear pain.  He has also tried tylenol and ibuprofen with no relief.  He was prescribed cefdinir for sinusitis 15 days ago and the ear pain has still not resolved.  He has been having trouble with coordination and balance and has experienced some headaches.  He has had a decreased appetite, and has pain when opening and closing the jaw when the painful spell is taking place.  He has had no fevers, nausea or vomiting.  Caregiver states that he had a recent head CT that showed no masses or metastasis of his lung cancer.   He states that he is trying to see an ENT specialist and would like to request a referral.    Patient is a 80 y.o. male presenting with ear pain. The history is provided by the patient. No language interpreter was used.  Otalgia Associated symptoms: headaches, hearing loss and neck pain   Associated symptoms: no abdominal pain, no congestion, no ear discharge, no fever, no rhinorrhea, no sore throat and no vomiting       Past Medical History  Diagnosis Date  . Hypertension   . COPD (chronic obstructive pulmonary disease)   . Hemorrhoids     external  . 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 GERD (gastroesophageal reflux disease)   . Emphysema   . HOH (hard of hearing)   . Non-small cell carcinoma of lung dx'd 2006    stage 3  . Lung cancer    . Pneumonia   . Seizures    Past Surgical History  Procedure Laterality Date  . Inner ear surgery    . Toe amputation Left   . Tonsillectomy    . Amputation Left 04/16/2013    Procedure: AMPUTATION RAY;  Surgeon: Newt Minion, MD;  Location: [facility];  Service: Orthopedics;  Laterality: Left;  Left Foot 4th and 5th Ray Amputation   No family history on file. History  Substance Use Topics  . Smoking status: Current Every Day Smoker -- 1.50 packs/day for 60 years    Types: Cigarettes  . Smokeless tobacco: Never Used  . Alcohol Use: No    Review of Systems  Constitutional: Positive for appetite change. Negative for fever and activity change.  HENT: Positive for ear pain and hearing loss. Negative for congestion, dental problem, ear discharge, facial swelling, postnasal drip, rhinorrhea, sinus pressure and sore throat.   Eyes: Negative for pain, discharge, itching and visual disturbance.  Respiratory: Negative for chest tightness and shortness of breath.   Cardiovascular: Negative for chest pain.  Gastrointestinal: Negative for nausea, vomiting and abdominal pain.  Musculoskeletal: Positive for neck pain.       He has associated radiation of the ear pain into his neck  Neurological: Positive for dizziness, weakness and headaches.  He has felt as though he has less control of his balance as he walks      Allergies  Review of patient's allergies indicates no known allergies.  Home Medications   Prior to Admission medications   Medication Sig Start Date End Date Taking? Authorizing Provider  acetaminophen (TYLENOL) 325 MG tablet Take 2 tablets (650 mg total) by mouth every 6 (six) hours as needed for mild pain (or Fever >/= 101). 04/03/13   Clinton Gallant, MD  cefdinir (OMNICEF) 300 MG capsule Take 1 capsule (300 mg total) by mouth 2 (two) times daily. 05/24/13   Noland Fordyce, PA-C  HYDROcodone-acetaminophen (NORCO) 5-325 MG per tablet Take 1-2 tablets by mouth every 6 (six) hours  as needed for severe pain. 06/03/13   Zakeria Kulzer A Amiere Cawley, PA-C  phenytoin (DILANTIN) 100 MG ER capsule Take 300 mg by mouth at bedtime and may repeat dose one time if needed.    Historical Provider, MD  warfarin (COUMADIN) 5 MG tablet Take 5 mg by mouth daily at 6 PM.    Historical Provider, MD   BP 131/68  Pulse 98  Temp(Src) 98.1 F (36.7 C) (Oral)  Resp 20  SpO2 98% Physical Exam  Constitutional: He is oriented to person, place, and time. He appears well-developed and well-nourished.  HENT:  Head: Normocephalic and atraumatic.  Right Ear: External ear normal.  Nose: Nose normal.  Mouth/Throat: Oropharynx is clear and moist.  Right external ear with cerumen.  Left external ear with white purulent drainage but without swelling of the external canal or painful ear movement.   Eyes: Conjunctivae and EOM are normal. Pupils are equal, round, and reactive to light. Right eye exhibits no discharge. Left eye exhibits no discharge.  Neck: Normal range of motion. No tracheal deviation present.  Neurological: He is alert and oriented to person, place, and time. No cranial nerve deficit.    ED Course  Procedures (including critical care time) Labs Review Labs Reviewed - No data to display  Imaging Review No results found.   EKG Interpretation None      MDM   Final diagnoses:  Ear pain, left    Will refer to ENT for further management. Will refill pain medication for attempt at symptomatic relief.  Dewaine Oats, PA-C 06/06/13 1446

## 2013-06-03 NOTE — ED Notes (Signed)
C/o pain to left ear and numbness to left side of face-states he is on abx for ear ache

## 2013-06-10 ENCOUNTER — Inpatient Hospital Stay (HOSPITAL_COMMUNITY): Payer: Medicare Other

## 2013-06-10 ENCOUNTER — Emergency Department (HOSPITAL_COMMUNITY): Payer: Medicare Other

## 2013-06-10 ENCOUNTER — Encounter (HOSPITAL_COMMUNITY): Payer: Self-pay | Admitting: Emergency Medicine

## 2013-06-10 ENCOUNTER — Inpatient Hospital Stay (HOSPITAL_COMMUNITY)
Admission: EM | Admit: 2013-06-10 | Discharge: 2013-06-11 | DRG: 066 | Disposition: A | Discharge status: Home / Self Care | Payer: Medicare Other | Attending: Oncology | Admitting: Oncology

## 2013-06-10 DIAGNOSIS — H6692 Otitis media, unspecified, left ear: Secondary | ICD-10-CM

## 2013-06-10 DIAGNOSIS — I6350 Cerebral infarction due to unspecified occlusion or stenosis of unspecified cerebral artery: Secondary | ICD-10-CM

## 2013-06-10 DIAGNOSIS — Z9119 Patient's noncompliance with other medical treatment and regimen: Secondary | ICD-10-CM

## 2013-06-10 DIAGNOSIS — H9190 Unspecified hearing loss, unspecified ear: Secondary | ICD-10-CM | POA: Diagnosis present

## 2013-06-10 DIAGNOSIS — R279 Unspecified lack of coordination: Secondary | ICD-10-CM | POA: Diagnosis present

## 2013-06-10 DIAGNOSIS — J320 Chronic maxillary sinusitis: Secondary | ICD-10-CM | POA: Diagnosis present

## 2013-06-10 DIAGNOSIS — Z86718 Personal history of other venous thrombosis and embolism: Secondary | ICD-10-CM

## 2013-06-10 DIAGNOSIS — H6690 Otitis media, unspecified, unspecified ear: Secondary | ICD-10-CM | POA: Diagnosis present

## 2013-06-10 DIAGNOSIS — I639 Cerebral infarction, unspecified: Secondary | ICD-10-CM | POA: Diagnosis present

## 2013-06-10 DIAGNOSIS — K219 Gastro-esophageal reflux disease without esophagitis: Secondary | ICD-10-CM | POA: Diagnosis present

## 2013-06-10 DIAGNOSIS — Z85118 Personal history of other malignant neoplasm of bronchus and lung: Secondary | ICD-10-CM

## 2013-06-10 DIAGNOSIS — R791 Abnormal coagulation profile: Secondary | ICD-10-CM | POA: Diagnosis present

## 2013-06-10 DIAGNOSIS — Z7901 Long term (current) use of anticoagulants: Secondary | ICD-10-CM

## 2013-06-10 DIAGNOSIS — Z86711 Personal history of pulmonary embolism: Secondary | ICD-10-CM

## 2013-06-10 DIAGNOSIS — F17200 Nicotine dependence, unspecified, uncomplicated: Secondary | ICD-10-CM | POA: Diagnosis present

## 2013-06-10 DIAGNOSIS — I10 Essential (primary) hypertension: Secondary | ICD-10-CM | POA: Diagnosis present

## 2013-06-10 DIAGNOSIS — J4489 Other specified chronic obstructive pulmonary disease: Secondary | ICD-10-CM | POA: Diagnosis present

## 2013-06-10 DIAGNOSIS — Z91199 Patient's noncompliance with other medical treatment and regimen due to unspecified reason: Secondary | ICD-10-CM

## 2013-06-10 DIAGNOSIS — Z923 Personal history of irradiation: Secondary | ICD-10-CM

## 2013-06-10 DIAGNOSIS — H60399 Other infective otitis externa, unspecified ear: Secondary | ICD-10-CM | POA: Diagnosis present

## 2013-06-10 DIAGNOSIS — I6340 Cerebral infarction due to embolism of unspecified cerebral artery: Principal | ICD-10-CM | POA: Diagnosis present

## 2013-06-10 DIAGNOSIS — R1310 Dysphagia, unspecified: Secondary | ICD-10-CM

## 2013-06-10 DIAGNOSIS — H7093 Unspecified mastoiditis, bilateral: Secondary | ICD-10-CM

## 2013-06-10 DIAGNOSIS — S98139A Complete traumatic amputation of one unspecified lesser toe, initial encounter: Secondary | ICD-10-CM

## 2013-06-10 DIAGNOSIS — J449 Chronic obstructive pulmonary disease, unspecified: Secondary | ICD-10-CM

## 2013-06-10 LAB — I-STAT CHEM 8, ED
BUN: 9 mg/dL (ref 6–23)
CALCIUM ION: 1.19 mmol/L (ref 1.13–1.30)
CREATININE: 0.8 mg/dL (ref 0.50–1.35)
Chloride: 98 mEq/L (ref 96–112)
Glucose, Bld: 111 mg/dL — ABNORMAL HIGH (ref 70–99)
HCT: 43 % (ref 39.0–52.0)
Hemoglobin: 14.6 g/dL (ref 13.0–17.0)
Potassium: 3.8 mEq/L (ref 3.7–5.3)
Sodium: 137 mEq/L (ref 137–147)
TCO2: 30 mmol/L (ref 0–100)

## 2013-06-10 LAB — CBC WITH DIFFERENTIAL/PLATELET
BASOS ABS: 0 10*3/uL (ref 0.0–0.1)
Basophils Relative: 1 % (ref 0–1)
EOS PCT: 1 % (ref 0–5)
Eosinophils Absolute: 0.1 10*3/uL (ref 0.0–0.7)
HCT: 42.3 % (ref 39.0–52.0)
Hemoglobin: 14.4 g/dL (ref 13.0–17.0)
LYMPHS ABS: 0.7 10*3/uL (ref 0.7–4.0)
Lymphocytes Relative: 11 % — ABNORMAL LOW (ref 12–46)
MCH: 29.2 pg (ref 26.0–34.0)
MCHC: 34 g/dL (ref 30.0–36.0)
MCV: 85.8 fL (ref 78.0–100.0)
MONO ABS: 0.5 10*3/uL (ref 0.1–1.0)
Monocytes Relative: 9 % (ref 3–12)
Neutro Abs: 4.5 10*3/uL (ref 1.7–7.7)
Neutrophils Relative %: 78 % — ABNORMAL HIGH (ref 43–77)
Platelets: 193 10*3/uL (ref 150–400)
RBC: 4.93 MIL/uL (ref 4.22–5.81)
RDW: 13.6 % (ref 11.5–15.5)
WBC: 5.8 10*3/uL (ref 4.0–10.5)

## 2013-06-10 LAB — PROTIME-INR
INR: 1.02 (ref 0.00–1.49)
Prothrombin Time: 13.2 seconds (ref 11.6–15.2)

## 2013-06-10 MED ORDER — WARFARIN SODIUM 7.5 MG PO TABS
7.5000 mg | ORAL_TABLET | Freq: Once | ORAL | Status: AC
  Administered 2013-06-10: 7.5 mg via ORAL
  Filled 2013-06-10: qty 1

## 2013-06-10 MED ORDER — MORPHINE SULFATE 2 MG/ML IJ SOLN
2.0000 mg | Freq: Once | INTRAMUSCULAR | Status: AC
  Administered 2013-06-10: 2 mg via INTRAVENOUS
  Filled 2013-06-10: qty 1

## 2013-06-10 MED ORDER — SODIUM CHLORIDE 0.9 % IV SOLN
INTRAVENOUS | Status: DC
  Administered 2013-06-10: 12:00:00 via INTRAVENOUS

## 2013-06-10 MED ORDER — ACETAMINOPHEN 325 MG PO TABS
650.0000 mg | ORAL_TABLET | Freq: Four times a day (QID) | ORAL | Status: DC | PRN
  Administered 2013-06-11 (×2): 650 mg via ORAL
  Filled 2013-06-10 (×2): qty 2

## 2013-06-10 MED ORDER — ONDANSETRON HCL 4 MG/2ML IJ SOLN
4.0000 mg | Freq: Once | INTRAMUSCULAR | Status: AC
  Administered 2013-06-10: 4 mg via INTRAVENOUS
  Filled 2013-06-10: qty 2

## 2013-06-10 MED ORDER — AMOXICILLIN-POT CLAVULANATE 875-125 MG PO TABS
1.0000 | ORAL_TABLET | Freq: Two times a day (BID) | ORAL | Status: DC
  Administered 2013-06-10 – 2013-06-11 (×2): 1 via ORAL
  Filled 2013-06-10 (×3): qty 1

## 2013-06-10 MED ORDER — CIPROFLOXACIN-DEXAMETHASONE 0.3-0.1 % OT SUSP
4.0000 [drp] | Freq: Two times a day (BID) | OTIC | Status: DC
  Administered 2013-06-10 – 2013-06-11 (×2): 4 [drp] via OTIC
  Filled 2013-06-10: qty 7.5

## 2013-06-10 MED ORDER — SENNOSIDES-DOCUSATE SODIUM 8.6-50 MG PO TABS: 1.0000 | ORAL_TABLET | Freq: Every evening | ORAL | Status: DC | PRN

## 2013-06-10 MED ORDER — IOHEXOL 300 MG/ML  SOLN
100.0000 mL | Freq: Once | INTRAMUSCULAR | Status: AC | PRN
  Administered 2013-06-10: 100 mL via INTRAVENOUS

## 2013-06-10 MED ORDER — WARFARIN - PHARMACIST DOSING INPATIENT: Freq: Every day | Status: DC

## 2013-06-10 MED ORDER — FLUTICASONE PROPIONATE 50 MCG/ACT NA SUSP
2.0000 | Freq: Every day | NASAL | Status: DC
  Administered 2013-06-10: 2 via NASAL
  Filled 2013-06-10: qty 16

## 2013-06-10 MED ORDER — LORAZEPAM 2 MG/ML IJ SOLN
0.5000 mg | Freq: Once | INTRAMUSCULAR | Status: AC
  Administered 2013-06-10: 0.5 mg via INTRAVENOUS
  Filled 2013-06-10: qty 1

## 2013-06-10 MED ORDER — SODIUM CHLORIDE 0.9 % IV SOLN
INTRAVENOUS | Status: AC
  Administered 2013-06-11: 03:00:00 via INTRAVENOUS

## 2013-06-10 MED ORDER — PHENYTOIN SODIUM EXTENDED 100 MG PO CAPS
300.0000 mg | ORAL_CAPSULE | Freq: Every day | ORAL | Status: DC
  Administered 2013-06-10: 300 mg via ORAL
  Filled 2013-06-10 (×2): qty 3

## 2013-06-10 NOTE — ED Notes (Signed)
Per EMS - pt from home with c/o L sided face/ear/jaw pain x4 weeks, comes intermittently every hour, lasting approx 30 seconds.  Pt reports being seen previously for same with dx ear infection.  A+Ox4.

## 2013-06-10 NOTE — H&P (Addendum)
Date: 06/11/2013               Patient Name:  Paul [name] MRN: [id number]  DOB: February 04, 1930 Age / Sex: 80 y.o. male   PCP: Billy Fischer, MD         Medical Service: Internal Medicine Teaching Service         Attending Physician: Dr. Annia Belt, MD    First Contact: Dr. Margart Sickles Pager: [phone]  Second Contact: Dr. Algis Liming Pager: [phone]       After Hours (After 5p/  First Contact Pager: [phone]  weekends / holidays): Second Contact Pager: [phone]   Chief Complaint: Facial pain  History of Present Illness: Paul [name] is a 80 y.o. male w/ PMHx of HTN, COPD, GERD, h/o Lung CA, and h/o seizures, presented to the [facility] ED w/ complaints of left facial pain and left ear pain. The patient is very hard of hearing and a poor historian, however, he claims the facial pain has been present for over a week now, located over the left side of his face, described as a pressure sensation. Paul [name] was seen in the ED recently for sinusitis, discharged on Cefdinir, but continues to have pain, congestion, and headache. The patient has also had recent issues w/ otalgia for which he was seen in the ED on 06/03/13 for right ear pain. Today, he complains more of left ear ache, tender to the touch over the helix of the ear. He has been using Cortisporine for this, but still has pain in both ears. The patient also describes recent symptoms of dizziness, vision changes, and right-sided facial weakness, all of which he claims have been present for 3-4 weeks. He denies any significant weakness in his extremities, but does say he has been more fatigued recently. He denies fever, chills, nausea, vomiting, abdominal pain, diarrhea, chest pain, SOB, or palpitations. 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  On arrival to the ED, patient received maxillofacial CT which showed RIGHT-sided maxillary sinusitis, bilateral mastoid effusions, and middle ear  disease. MRI was also performed which showed several scattered small acute infarcts in the posterior cerebral and cerebellar hemispheres.    Meds: Current Facility-Administered Medications  Medication Dose Route Frequency Provider Last Rate Last Dose  . 0.9 %  sodium chloride infusion   Intravenous Continuous Richarda Blade, MD 125 mL/hr at 06/10/13 1227    . 0.9 %  sodium chloride infusion   Intravenous STAT Richarda Blade, MD      . acetaminophen (TYLENOL) tablet 650 mg  650 mg Oral Q6H PRN Otho Bellows, MD   650 mg at 06/11/13 0129  . amoxicillin-clavulanate (AUGMENTIN) 875-125 MG per tablet 1 tablet  1 tablet Oral Q12H Otho Bellows, MD   1 tablet at 06/10/13 2315  . ciprofloxacin-dexamethasone (CIPRODEX) 0.3-0.1 % otic suspension 4 drop  4 drop Both Ears BID Otho Bellows, MD   4 drop at 06/10/13 2319  . fluticasone (FLONASE) 50 MCG/ACT nasal spray 2 spray  2 spray Each Nare Daily Otho Bellows, MD   2 spray at 06/10/13 2321  . phenytoin (DILANTIN) ER capsule 300 mg  300 mg Oral QHS Otho Bellows, MD   300 mg at 06/10/13 2314  . senna-docusate (Senokot-S) tablet 1 tablet  1 tablet Oral QHS PRN Otho Bellows, MD      . Warfarin -  Pharmacist Dosing Inpatient   Does not apply [place], [facility]       Medications Prior to Admission  Medication Sig Dispense Refill  . acetaminophen (TYLENOL) 325 MG tablet Take 2 tablets (650 mg total) by mouth every 6 (six) hours as needed for mild pain (or Fever >/= 101).  30 tablet  0  . cefdinir (OMNICEF) 300 MG capsule Take 1 capsule (300 mg total) by mouth 2 (two) times daily.  60 capsule  0  . fluticasone (FLONASE) 50 MCG/ACT nasal spray Place 2 sprays into both nostrils daily.      Marland Kitchen HYDROcodone-acetaminophen (NORCO) 5-325 MG per tablet Take 1-2 tablets by mouth every 6 (six) hours as needed for severe pain.  15 tablet  0  . NEOMYCIN-POLYMYXIN-HYDROCORTISONE (CORTISPORIN) 1 % SOLN otic solution Place 3 drops into the left ear 4  (four) times daily.      . phenytoin (DILANTIN) 100 MG ER capsule Take 300 mg by mouth at bedtime.       Marland Kitchen warfarin (COUMADIN) 5 MG tablet Take 5 mg by mouth daily at 6 PM.        Allergies: Allergies as of 06/10/2013 - Review Complete 06/10/2013  Allergen Reaction Noted  . No known allergies  06/10/2013   Past Medical History  Diagnosis Date  . Hypertension   . COPD (chronic obstructive pulmonary disease)   . Hemorrhoids     external  . 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 GERD (gastroesophageal reflux disease)   . Emphysema   . HOH (hard of hearing)   . Non-small cell carcinoma of lung dx'd 2006    stage 3  . Lung cancer   . Pneumonia   . Seizures    Past Surgical History  Procedure Laterality Date  . Inner ear surgery    . Toe amputation Left   . Tonsillectomy    . Amputation Left 04/16/2013    Procedure: AMPUTATION RAY;  Surgeon: Newt Minion, MD;  Location: [place];  Service: Orthopedics;  Laterality: Left;  Left Foot 4th and 5th Ray Amputation   History reviewed. No pertinent family history. History   Social History  . Marital Status: Divorced    Spouse Name: N/A    Number of Children: N/A  . Years of Education: N/A   Occupational History  . Not on file.   Social History Main Topics  . Smoking status: Current Every Day Smoker -- 1.50 packs/day for 60 years    Types: Cigarettes  . Smokeless tobacco: Never Used  . Alcohol Use: No  . Drug Use: No  . Sexual Activity: Not on file   Other Topics Concern  . Not on file   Social History Narrative  . No narrative on file    Review of Systems: General: Positive for fatigue. Denies fever, chills, diaphoresis, appetite change.  HEENT: Positive for left-sided facial pain, numbness, and pressure. Also w/ bilateral ear pain.  Respiratory: Positive for chronic cough. Denies SOB, DOE, chest tightness, and wheezing.   Cardiovascular: Denies chest pain and palpitations.  Gastrointestinal: Denies  nausea, vomiting, abdominal pain, diarrhea, constipation, blood in stool and abdominal distention.  Genitourinary: Denies dysuria, urgency, frequency, hematuria, and flank pain. Endocrine: Denies hot or cold intolerance, polyuria, and polydipsia. Musculoskeletal: Positive for difficulty w/ gait. Denies myalgias, back pain, joint swelling, arthralgias.  Skin: Denies pallor, rash and wounds.  Neurological: Positive for dizziness, lightheadedness, generalized weakness, facial numbness/weakness and  headaches. Denies seizures and syncope.  Psychiatric/Behavioral: Denies mood changes, confusion, nervousness, sleep disturbance and agitation.   Physical Exam: Filed Vitals:   06/10/13 1849 06/10/13 2100 06/10/13 2133 06/11/13 0055  BP: 110/71  134/68 118/54  Pulse: 81  79 88  Temp: 97.5 F (36.4 C)  97.5 F (36.4 C) 98.1 F (36.7 C)  TempSrc: Oral  Oral Oral  Resp: 18  16 18   Height:  5\' 10"  (1.778 m)    Weight:  143 lb 15.4 oz (65.3 kg)    SpO2: 95%  100% 97%   General: Vital signs reviewed.  Patient is a frail elderly male, in no acute distress and cooperative with exam. Hard of hearing.  HEENT: Normocephalic and atraumatic. PERRL, EOMI, conjunctivae normal, No scleral icterus. Left-sided eyelid droop. Left ear w/ TTP over helix/anti-helix. Scabbed lesion on helix. External auditory canal purple (2/2 ear drops), effusion present behind tympanic membrane. Right ear w/out TTP, however auditory canal tender. Also purple, w/ erythema and questionable effusion. Cerumen present. Patient also w/ left-sided facial droop.  Neck: Supple, trachea midline, normal ROM, No JVD, masses, thyromegaly, or carotid bruit present.  Cardiovascular: RRR, S1 normal, S2 normal, no murmurs, gallops, or rubs. Pulmonary/Chest: Air entry equal bilaterally, mildly coarse breath sounds throughout, no wheezes or rhonchi. Abdominal: Soft, non-tender, non-distended, BS +, no masses, organomegaly, or guarding present.    Musculoskeletal: No joint deformities, erythema, or stiffness, ROM full and nontender. Extremities: No swelling or edema,  pulses symmetric and intact bilaterally. No cyanosis or clubbing. 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 No drainage or erythema. Neurological: A&O x3, Strength is normal and symmetric bilaterally, mild left facial droop, otherwise cranial nerve II-XII are grossly intact, no focal motor deficit, sensory intact to light touch bilaterally.  Skin: Warm, dry and intact. No rashes or erythema. Psychiatric: Normal mood and affect. Speech and behavior is normal. Cognition and memory are normal.    Lab results: Basic Metabolic Panel:  Recent Labs  06/10/13 1235  NA 137  K 3.8  CL 98  GLUCOSE 111*  BUN 9  CREATININE 0.80   CBC:  Recent Labs  06/10/13 1225 06/10/13 1235  WBC 5.8  --   NEUTROABS 4.5  --   HGB 14.4 14.6  HCT 42.3 43.0  MCV 85.8  --   PLT 193  --    Hemoglobin A1C: No results found for this basename: HGBA1C,  in the last 72 hours Fasting Lipid Panel: No results found for this basename: CHOL, HDL, LDLCALC, TRIG, CHOLHDL, LDLDIRECT,  in the last 72 hours  Coagulation:  Recent Labs  06/10/13 1225  LABPROT 13.2  INR 1.02   Urine Drug Screen: Drugs of Abuse     Component Value Date/Time   LABOPIA NONE DETECTED 04/01/2013 1858   COCAINSCRNUR NONE DETECTED 04/01/2013 1858   LABBENZ NONE DETECTED 04/01/2013 1858   AMPHETMU NONE DETECTED 04/01/2013 1858   THCU NONE DETECTED 04/01/2013 1858   LABBARB NONE DETECTED 04/01/2013 1858     Imaging results:  Mr Jodene Nam Head Wo Contrast  06/10/2013   CLINICAL DATA:  80 year old male with new onset left facial droop. Left facial pain with recent year and mastoid pain. Initial encounter. History of lung cancer.  EXAM: MRI HEAD WITHOUT CONTRAST  MRA HEAD WITHOUT CONTRAST  TECHNIQUE: Multiplanar, multiecho pulse sequences of the brain and surrounding structures were obtained without  intravenous contrast. Angiographic images of the head were obtained using MRA technique without contrast.  COMPARISON:  Face CT with contrast from the same day. Head CT without contrast 05/24/2013 and earlier.  FINDINGS: MRI HEAD FINDINGS  The examination had to be discontinued prior to completion due to the patient's condition.  Axial in coronal diffusion weighted images demonstrate about 6 small areas of restricted diffusion occurring in the bilateral posterior hemispheres and bilateral cerebellar hemispheres (series 4 and series 5). The largest and most intense of these areas are in the left occipital periventricular white matter (7 mm, series 4, image 16) and left posterior cerebellar hemisphere (6 mm image 7).  No associated mass effect or hemorrhage. Mild associated T2 hyperintensity.  Major intracranial vascular flow voids are preserved except for the distal right vertebral artery, see below.  No midline shift, mass effect, or evidence of intracranial mass lesion. Generalized cerebral volume loss. No ventriculomegaly. No acute or chronic blood products identified. Patchy cerebral white matter T2 and FLAIR hyperintensity, nonspecific. Negative pituitary, cervicomedullary junction and visualized cervical spine.  Visualized orbit soft tissues are within normal limits. Fluid level and mucosal thickening in the right maxillary sinus. Bilateral mastoid fluid. Small volume retained secretions in the nasopharynx. Bilateral seventh nerves at the stylomastoid foramina appear normal. Visualized parotid glands are within normal limits. Normal bone marrow signal.  MRA HEAD FINDINGS  There is antegrade flow in the distal right vertebral artery, but the signal is diminished compared to that on the left. Antegrade flow in the distal left vertebral artery appears normal. The basilar artery is patent without stenosis. AICA, SCA, and PCA origins are within normal limits. Bilateral PCA branches are within normal limits.  Posterior communicating arteries are diminutive or absent.  Antegrade flow in both ICA siphons. Moderate to severe stenosis in the left vertical P each wrist ICA segment (series 703, image 15). Despite this, the left ICA remains patent to the terminus. Ophthalmic artery origins within normal limits. Normal right ICA terminus. MCA and ACA origins are within normal limits. Diminutive anterior communicating artery. Visualized ACA branches are within normal limits. Visualized bilateral MCA branches are within normal limits.  IMPRESSION: 1. Scattered small acute infarcts. About 6 lesions are identified in the posterior cerebral and cerebellar hemispheres. No associated mass effect or hemorrhage. 2. The pattern raises the possibility of a recent embolic event. Synchronous small vessel ischemia it is less likely. 3. Posterior circulation remarkable for poor flow in the distal right vertebral artery. This could be related to the acute findings. 4. Anterior circulation remarkable for high-grade stenosis of the left ICA siphon. 5. No major circle of Willis branch occlusion identified.   Electronically Signed   By: Lars Pinks M.D.   On: 06/10/2013 16:16   Mr Brain Wo Contrast  06/10/2013   CLINICAL DATA:  80 year old male with new onset left facial droop. Left facial pain with recent year and mastoid pain. Initial encounter. History of lung cancer.  EXAM: MRI HEAD WITHOUT CONTRAST  MRA HEAD WITHOUT CONTRAST  TECHNIQUE: Multiplanar, multiecho pulse sequences of the brain and surrounding structures were obtained without intravenous contrast. Angiographic images of the head were obtained using MRA technique without contrast.  COMPARISON:  Face CT with contrast from the same day. Head CT without contrast 05/24/2013 and earlier.  FINDINGS: MRI HEAD FINDINGS  The examination had to be discontinued prior to completion due to the patient's condition.  Axial in coronal diffusion weighted images demonstrate about 6 small areas of  restricted diffusion occurring in the bilateral posterior hemispheres and bilateral cerebellar hemispheres (series 4 and  series 5). The largest and most intense of these areas are in the left occipital periventricular white matter (7 mm, series 4, image 16) and left posterior cerebellar hemisphere (6 mm image 7).  No associated mass effect or hemorrhage. Mild associated T2 hyperintensity.  Major intracranial vascular flow voids are preserved except for the distal right vertebral artery, see below.  No midline shift, mass effect, or evidence of intracranial mass lesion. Generalized cerebral volume loss. No ventriculomegaly. No acute or chronic blood products identified. Patchy cerebral white matter T2 and FLAIR hyperintensity, nonspecific. Negative pituitary, cervicomedullary junction and visualized cervical spine.  Visualized orbit soft tissues are within normal limits. Fluid level and mucosal thickening in the right maxillary sinus. Bilateral mastoid fluid. Small volume retained secretions in the nasopharynx. Bilateral seventh nerves at the stylomastoid foramina appear normal. Visualized parotid glands are within normal limits. Normal bone marrow signal.  MRA HEAD FINDINGS  There is antegrade flow in the distal right vertebral artery, but the signal is diminished compared to that on the left. Antegrade flow in the distal left vertebral artery appears normal. The basilar artery is patent without stenosis. AICA, SCA, and PCA origins are within normal limits. Bilateral PCA branches are within normal limits. Posterior communicating arteries are diminutive or absent.  Antegrade flow in both ICA siphons. Moderate to severe stenosis in the left vertical P each wrist ICA segment (series 703, image 15). Despite this, the left ICA remains patent to the terminus. Ophthalmic artery origins within normal limits. Normal right ICA terminus. MCA and ACA origins are within normal limits. Diminutive anterior communicating artery.  Visualized ACA branches are within normal limits. Visualized bilateral MCA branches are within normal limits.  IMPRESSION: 1. Scattered small acute infarcts. About 6 lesions are identified in the posterior cerebral and cerebellar hemispheres. No associated mass effect or hemorrhage. 2. The pattern raises the possibility of a recent embolic event. Synchronous small vessel ischemia it is less likely. 3. Posterior circulation remarkable for poor flow in the distal right vertebral artery. This could be related to the acute findings. 4. Anterior circulation remarkable for high-grade stenosis of the left ICA siphon. 5. No major circle of Willis branch occlusion identified.   Electronically Signed   By: Lars Pinks M.D.   On: 06/10/2013 16:16   Ct Maxillofacial W/cm  06/10/2013   CLINICAL DATA:  Left-sided mastoid pain and left facial redness.  EXAM: CT MAXILLOFACIAL WITH CONTRAST  TECHNIQUE: Multidetector CT imaging of the maxillofacial structures was performed with intravenous contrast. Multiplanar CT image reconstructions were also generated. A small metallic BB was placed on the right temple in order to reliably differentiate right from left.  CONTRAST:  156mL OMNIPAQUE IOHEXOL 300 MG/ML  SOLN  COMPARISON:  Head CT 05/25/2010  FINDINGS: There is chronic right maxillary sinus disease with mucoperiosteal thickening, fluid and mildly thickened sinus walls. The left maxillary sinus is clear. The frontal and ethmoid sinuses are clear. Both half of the sphenoid sinus are clear.  There are bilateral mastoid effusions and middle ear disease. No discrete abscess. The visualized portion of the brain is unremarkable. Chronic atrophy and ventriculomegaly are noted. The globes are intact. No neck mass or adenopathy. The parotid and submandibular glands are unremarkable. There are dense carotid artery calcifications noted bilaterally.  IMPRESSION: Chronic right maxillary sinusitis.  Bilateral mastoid effusions and middle ear  disease.  No subcutaneous soft tissue abscess or significant cellulitis.   Electronically Signed   By: Kalman Jewels M.D.   On:  06/10/2013 13:38    Other results: EKG: pending  Assessment & Plan by Problem: Paul [name] is a 80 y.o. male w/ PMHx of HTN, COPD, GERD, h/o Lung CA, and h/o seizures, admitted for likely embolic stroke involving left posterior cerebral and cerebellar hemispheres.  Embolic Stroke- Patient w/ recent issues w/ dizziness, lightheadedness, generalized weakness, and left-sided facial droop. Patient's main complaint was left-sided facial pain, however, MRI was performed in the ED, showed several scattered small acute infarcts (~6) in the posterior cerebral and cerebellar hemispheres, raising the possibility of a recent embolic event. MRA also suggests posterior circulation defect w/ poor flow in the distal right vertebral artery. Patient has h/o DVT, on Coumadin at home, compliance however, is in question, given admission INR of 1.02.  -Admit to telemetry -ECHO in AM -CA Dopplers -HbA1c, Lipid panel in AM -Continue Coumadin -PT/OT/SLP evaluation  Bilateral Ear Pain and Sinusitis- Patient w/ recent h/o sinusitis, treated w/ cephalosporin, but continues to have facial pain and pressure. Also w/ ear pain, now left more than right, recently seen in ED for this, discharged w/ ear drops. Now w/ left-sided effusion seen on exam, tenderness to palpation over helix, therefore likely both otitis media and externa in the left ear. Also w/ erythema and questionable effusion on the right ear. CT maxillofacial shows chronic right maxillary sinusitis w/ bilateral mastoid effusions and middle ear disease, w/ no subcutaneous soft tissue abscess or significant cellulitis. No leukocytosis on admission.  -Started Augmentin 875-125 po bid -Ciprodex ear drops; 4 drops in each ear bid -Consider ENT consult in AM -Tylenol prn for headache -PT/OT vestibular evaluation  H/o Non-Small Cell  Lung CA- Patient previously w/ non-small cell CA obstructing lesion in the right upper lobe. Received radiation, completed in 2007. Patient still smokes 2 packs daily.  -2 view CXR pending  HTN- Not on any home medications. Normotensive on admission.  -Allow for permissive HTN  COPD- No active issues. Mild coarse breath sounds on exam w/ chronic cough. Patient claims this is his baseline. No SOB noted.  -Continue to monitor  H/o Seizures- No recent seizures. -Continue home Dilantin  DVT/PE PPx- Coumadin  Dispo: Disposition is deferred at this time, awaiting improvement of current medical problems. Anticipated discharge in approximately 1-2 day(s).   The patient does have a current PCP Billy Fischer, MD) and does not need an [facility] hospital follow-up appointment after discharge.  The patient does not have transportation limitations that hinder transportation to clinic appointments.  Signed:   Corky Sox, MD 06/11/2013, 1:42 AM  Attending physician note: History and physical, impression and plan, accurate as recorded above by resident physician Dr. Luanne Bras. I personally interviewed and examined this patient, reviewed the database and pertinent x-ray studies. Summary: Pleasant 80 year old man who comes in ostensibly complaining of left face pain, ear pain, and numbness most of which is chronic in nature. He has also been having some right-sided weakness and vertigo over the last 3-4 weeks. Difficult to obtain an accurate history due to his advanced neurosensory hearing loss. Scans in the emergency department show bilateral, small, cerebral, and cerebellar infarcts. These were not seen on a 04/01/2013 study done to evaluate mental status changes. He has a remote history of non-small cell lung cancer treated with primary radiation.  The lesions on MRI do not appear to be related to metastatic tumor. MR angiogram shows poor flow in the distal right vertebral artery.  He has no heart murmur on  exam.  No carotid bruits. Electrocardiogram with a chronic right bundle-branch block related to his obstructive airway disease. No other obvious arrhythmia. He was taking warfarin as an outpatient for unclear reasons and levels were subtherapeutic at presentation. An echocardiogram was done back in March and repeated again on 06/11/2013 and show no gross abnormalities other than those consistent with age. No obvious cardiac embolic source.  On my exam: He is alert and oriented. Hard of hearing. He is normotensive. There is anisocoria with the right 2-3 mm greater than the left. Both reactive. Full extraocular movements. Mild left facial droop. Motor strength is 5 over 5 all extremities. Reflexes 1+ symmetric. Upper body coordination normal. Gait not tested. Regular cardiac rhythm no murmur or gallop. No carotid bruits.  Impression: Multifocal cerebral and cerebellar infarcts consistent with embolic stroke. Minimal neurologic deficits on exam.  Stroke team has been consulted. I'm going to start him on aspirin pending further evaluation. Resume his Coumadin and adjust to therapeutic dose. Lovenox bridge until Coumadin therapeutic. Further recommendations per neurology consultant but I anticipate that he will need to be on chronic warfarin anticoagulation. He is not a good candidate for one of the new target specific oral anticoagulants given his age.  Murriel Hopper, M.D., FACP   Chart review reveals that the patient had a pulmonary embolus found incidentally on a CT scan in May of 2006. He subsequently had a right lower show any DVT in August 2007 when he was not on anticoagulation. This was indication for chronic anticoagulation.  Initial cancer stage IIIa diagnosed November 2005. He was treated with concomitant chemotherapy and radiation. He recurred in February 2007. He had a prolonged course of Alimta chemotherapy and achieved a complete and durable remission.  Murriel Hopper, MD, FACP    Hematology-Oncology/Internal Medicine    Is a

## 2013-06-10 NOTE — Progress Notes (Signed)
ANTICOAGULATION CONSULT NOTE - Initial Consult  Pharmacy Consult for Coumadin Indication: stroke  Allergies  Allergen Reactions  . No Known Allergies     Patient Measurements: Height: 5\' 10"  (177.8 cm) Weight: 143 lb 15.4 oz (65.3 kg) IBW/kg (Calculated) : 73  Vital Signs: Temp: 97.5 F (36.4 C) (05/21 1849) Temp src: Oral (05/21 1849) BP: 110/71 mmHg (05/21 1849) Pulse Rate: 81 (05/21 1849)  Labs:  Recent Labs  06/10/13 1225 06/10/13 1235  HGB 14.4 14.6  HCT 42.3 43.0  PLT 193  --   LABPROT 13.2  --   INR 1.02  --   CREATININE  --  0.80    Estimated Creatinine Clearance: 68 ml/min (by C-G formula based on Cr of 0.8).   Medical History: Past Medical History  Diagnosis Date  . Hypertension   . COPD (chronic obstructive pulmonary disease)   . Hemorrhoids     external  . 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 GERD (gastroesophageal reflux disease)   . Emphysema   . HOH (hard of hearing)   . Non-small cell carcinoma of lung dx'd 2006    stage 3  . Lung cancer   . Pneumonia   . Seizures    Assessment:   Admitted with new stroke.  Coumadin 5 mg daily is among his home meds, but INR only 1.02.  Last dose noted 5/19, though patient could not confirm.   Rx had assisted with Coumadin dosing during March 12-14, 2015 admit.  Admit INR was also subtherapeutic at that time, though he did have an INR of 2.54 on 04/16/13 when here for left 5th toe amputation.   Hx lung cancer in 2005; PE in May 2006 and RLE DVT in 2007.   Goal of Therapy:  INR 2-3 Monitor platelets by anticoagulation protocol: Yes   Plan:   Coumadin 7.5 mg tonight.  Daily PT/INR.  Arty Baumgartner, [place] Pager: [phone] 06/10/2013,9:23 PM

## 2013-06-10 NOTE — ED Notes (Signed)
[facility] here to provide safe transport to [facility].  Verbal report provided to [facility].  NAD upon leaving dept.

## 2013-06-10 NOTE — ED Provider Notes (Signed)
CSN: [id number]     Arrival date & time 06/10/13  1126 History   First MD Initiated Contact with Patient 06/10/13 1142     Chief Complaint  Patient presents with  . Facial Pain     (Consider location/radiation/quality/duration/timing/severity/associated sxs/prior Treatment) HPI  Paul [name] is a 80 y.o. male who states that he has pain and numbness in his left face that is present. For several weeks despite  Treatment. He's been treated with a course of Cefdinir, and then received ear drops (polymyxin hydrocortisone neomycin). He stopped using the eardrops 4 days ago. He feels like he is not improved. He has not yet followed up with ENT, as directed. He denies fever, chills, nausea, vomiting, weakness, or dizziness. He's taking his usual medications, without relief. There are no other known modifying factors.  Past Medical History  Diagnosis Date  . Hypertension   . COPD (chronic obstructive pulmonary disease)   . Hemorrhoids     external  . 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 GERD (gastroesophageal reflux disease)   . Emphysema   . HOH (hard of hearing)   . Non-small cell carcinoma of lung dx'd 2006    stage 3  . Lung cancer   . Pneumonia   . Seizures    Past Surgical History  Procedure Laterality Date  . Inner ear surgery    . Toe amputation Left   . Tonsillectomy    . Amputation Left 04/16/2013    Procedure: AMPUTATION RAY;  Surgeon: Newt Minion, MD;  Location: [place];  Service: Orthopedics;  Laterality: Left;  Left Foot 4th and 5th Ray Amputation   No family history on file. History  Substance Use Topics  . Smoking status: Current Every Day Smoker -- 1.50 packs/day for 60 years    Types: Cigarettes  . Smokeless tobacco: Never Used  . Alcohol Use: No    Review of Systems  All other systems reviewed and are negative.     Allergies  No known allergies  Home Medications   Prior to Admission medications   Medication Sig Start Date End  Date Taking? Authorizing Provider  acetaminophen (TYLENOL) 325 MG tablet Take 2 tablets (650 mg total) by mouth every 6 (six) hours as needed for mild pain (or Fever >/= 101). 04/03/13  Yes Clinton Gallant, MD  cefdinir (OMNICEF) 300 MG capsule Take 1 capsule (300 mg total) by mouth 2 (two) times daily. 05/24/13  Yes Noland Fordyce, PA-C  fluticasone (FLONASE) 50 MCG/ACT nasal spray Place 2 sprays into both nostrils daily.   Yes Historical Provider, MD  HYDROcodone-acetaminophen (NORCO) 5-325 MG per tablet Take 1-2 tablets by mouth every 6 (six) hours as needed for severe pain. 06/03/13  Yes Shari A Upstill, PA-C  NEOMYCIN-POLYMYXIN-HYDROCORTISONE (CORTISPORIN) 1 % SOLN otic solution Place 3 drops into the left ear 4 (four) times daily.   Yes Historical Provider, MD  phenytoin (DILANTIN) 100 MG ER capsule Take 300 mg by mouth at bedtime.    Yes Historical Provider, MD  warfarin (COUMADIN) 5 MG tablet Take 5 mg by mouth daily at 6 PM.   Yes Historical Provider, MD   BP 172/85  Pulse 79  Temp(Src) 97.4 F (36.3 C) (Oral)  Resp 18  SpO2 97% Physical Exam  Nursing note and vitals reviewed. Constitutional: He is oriented to person, place, and time. He appears well-developed.  Elderly, frail  HENT:  Head: Normocephalic and atraumatic.  Right Ear: External  ear normal.  Left Ear: External ear normal.  Nose: Nose normal.  Mouth/Throat: Oropharynx is clear and moist.  Right tympanic membrane, distorted with likely cholesteatoma,  and auditory canal is normal. Left tympanic membrane is distorted, without usual landmarks and appears retracted. There is an unusual bright purple discoloration to the left TM and EAC. No significant D/C in EAC.   Eyes: Conjunctivae and EOM are normal. Pupils are equal, round, and reactive to light.  Mild injection, left conjunctiva, without discharge.  Neck: Normal range of motion and phonation normal. Neck supple.  Cardiovascular: Normal rate, regular rhythm, normal heart sounds  and intact distal pulses.   Pulmonary/Chest: Effort normal and breath sounds normal. He exhibits no bony tenderness.  Abdominal: Soft. There is no tenderness.  Musculoskeletal: Normal range of motion.  Neurological: He is alert and oriented to person, place, and time. A cranial nerve deficit (Left upper and lower facial nerve weakness) is present. No sensory deficit. He exhibits normal muscle tone. Coordination normal.  Skin: Skin is warm, dry and intact.  Psychiatric: He has a normal mood and affect. His behavior is normal. Judgment and thought content normal.    ED Course  Procedures (including critical care time)  Medications  0.9 %  sodium chloride infusion ( Intravenous New Bag/Given 06/10/13 1227)  iohexol (OMNIPAQUE) 300 MG/ML solution 100 mL (100 mLs Intravenous Contrast Given 06/10/13 1303)  LORazepam (ATIVAN) injection 0.5 mg (0.5 mg Intravenous Given 06/10/13 1454)  morphine 2 MG/ML injection 2 mg (2 mg Intravenous Given 06/10/13 1454)  ondansetron (ZOFRAN) injection 4 mg (4 mg Intravenous Given 06/10/13 1454)    Patient Vitals for the past 24 hrs:  BP Temp Temp src Pulse Resp SpO2  06/10/13 1544 172/85 mmHg 97.4 F (36.3 C) Oral 79 18 97 %  06/10/13 1431 159/81 mmHg - - 72 15 99 %  06/10/13 1131 138/73 mmHg 98.5 F (36.9 C) Oral 84 20 97 %   12:35- I discussed imaging choices with Dr. Nevada Crane , Radiologist.  4:36 PM-Consult complete with Dr. Letta Median. Patient case explained and discussed. He agrees to admit patient for further evaluation and treatment. Call ended at 1642. He saw the pt and believes that it is a Internal Medicine Teaching Service patient- 16:52-- he arranged for the resident to admit the pt.  16:40 PM Reevaluation with update and discussion. After initial assessment and treatment, an updated evaluation reveals no further complaints. He pulled his IV out and was walking in the hall, but came back, to his room, when the nurse asked him to. He is cooperative. Richarda Blade   16:55- I discussed the case with the Neuro Hospitalist [facility])- who will see the patient at [facility], for evaluation and treatment recommendation.    Labs Review Labs Reviewed  CBC WITH DIFFERENTIAL - Abnormal; Notable for the following:    Neutrophils Relative % 78 (*)    Lymphocytes Relative 11 (*)    All other components within normal limits  I-STAT CHEM 8, ED - Abnormal; Notable for the following:    Glucose, Bld 111 (*)    All other components within normal limits  PROTIME-INR    Imaging Review Mr [name] Wo Contrast  06/10/2013   CLINICAL DATA:  80 year old male with new onset left facial droop. Left facial pain with recent year and mastoid pain. Initial encounter. History of lung cancer.  EXAM: MRI HEAD WITHOUT CONTRAST  MRA HEAD WITHOUT CONTRAST  TECHNIQUE: Multiplanar, multiecho pulse sequences of the  brain and surrounding structures were obtained without intravenous contrast. Angiographic images of the head were obtained using MRA technique without contrast.  COMPARISON:  Face CT with contrast from the same day. Head CT without contrast 05/24/2013 and earlier.  FINDINGS: MRI HEAD FINDINGS  The examination had to be discontinued prior to completion due to the patient's condition.  Axial in coronal diffusion weighted images demonstrate about 6 small areas of restricted diffusion occurring in the bilateral posterior hemispheres and bilateral cerebellar hemispheres (series 4 and series 5). The largest and most intense of these areas are in the left occipital periventricular white matter (7 mm, series 4, image 16) and left posterior cerebellar hemisphere (6 mm image 7).  No associated mass effect or hemorrhage. Mild associated T2 hyperintensity.  Major intracranial vascular flow voids are preserved except for the distal right vertebral artery, see below.  No midline shift, mass effect, or evidence of intracranial mass lesion. Generalized cerebral volume loss. No  ventriculomegaly. No acute or chronic blood products identified. Patchy cerebral white matter T2 and FLAIR hyperintensity, nonspecific. Negative pituitary, cervicomedullary junction and visualized cervical spine.  Visualized orbit soft tissues are within normal limits. Fluid level and mucosal thickening in the right maxillary sinus. Bilateral mastoid fluid. Small volume retained secretions in the nasopharynx. Bilateral seventh nerves at the stylomastoid foramina appear normal. Visualized parotid glands are within normal limits. Normal bone marrow signal.  MRA HEAD FINDINGS  There is antegrade flow in the distal right vertebral artery, but the signal is diminished compared to that on the left. Antegrade flow in the distal left vertebral artery appears normal. The basilar artery is patent without stenosis. AICA, SCA, and PCA origins are within normal limits. Bilateral PCA branches are within normal limits. Posterior communicating arteries are diminutive or absent.  Antegrade flow in both ICA siphons. Moderate to severe stenosis in the left vertical P each wrist ICA segment (series 703, image 15). Despite this, the left ICA remains patent to the terminus. Ophthalmic artery origins within normal limits. Normal right ICA terminus. MCA and ACA origins are within normal limits. Diminutive anterior communicating artery. Visualized ACA branches are within normal limits. Visualized bilateral MCA branches are within normal limits.  IMPRESSION: 1. Scattered small acute infarcts. About 6 lesions are identified in the posterior cerebral and cerebellar hemispheres. No associated mass effect or hemorrhage. 2. The pattern raises the possibility of a recent embolic event. Synchronous small vessel ischemia it is less likely. 3. Posterior circulation remarkable for poor flow in the distal right vertebral artery. This could be related to the acute findings. 4. Anterior circulation remarkable for high-grade stenosis of the left ICA  siphon. 5. No major circle of Willis branch occlusion identified.   Electronically Signed   By: Lars Pinks M.D.   On: 06/10/2013 16:16   Mr Brain Wo Contrast  06/10/2013   CLINICAL DATA:  80 year old male with new onset left facial droop. Left facial pain with recent year and mastoid pain. Initial encounter. History of lung cancer.  EXAM: MRI HEAD WITHOUT CONTRAST  MRA HEAD WITHOUT CONTRAST  TECHNIQUE: Multiplanar, multiecho pulse sequences of the brain and surrounding structures were obtained without intravenous contrast. Angiographic images of the head were obtained using MRA technique without contrast.  COMPARISON:  Face CT with contrast from the same day. Head CT without contrast 05/24/2013 and earlier.  FINDINGS: MRI HEAD FINDINGS  The examination had to be discontinued prior to completion due to the patient's condition.  Axial in coronal diffusion weighted  images demonstrate about 6 small areas of restricted diffusion occurring in the bilateral posterior hemispheres and bilateral cerebellar hemispheres (series 4 and series 5). The largest and most intense of these areas are in the left occipital periventricular white matter (7 mm, series 4, image 16) and left posterior cerebellar hemisphere (6 mm image 7).  No associated mass effect or hemorrhage. Mild associated T2 hyperintensity.  Major intracranial vascular flow voids are preserved except for the distal right vertebral artery, see below.  No midline shift, mass effect, or evidence of intracranial mass lesion. Generalized cerebral volume loss. No ventriculomegaly. No acute or chronic blood products identified. Patchy cerebral white matter T2 and FLAIR hyperintensity, nonspecific. Negative pituitary, cervicomedullary junction and visualized cervical spine.  Visualized orbit soft tissues are within normal limits. Fluid level and mucosal thickening in the right maxillary sinus. Bilateral mastoid fluid. Small volume retained secretions in the nasopharynx.  Bilateral seventh nerves at the stylomastoid foramina appear normal. Visualized parotid glands are within normal limits. Normal bone marrow signal.  MRA HEAD FINDINGS  There is antegrade flow in the distal right vertebral artery, but the signal is diminished compared to that on the left. Antegrade flow in the distal left vertebral artery appears normal. The basilar artery is patent without stenosis. AICA, SCA, and PCA origins are within normal limits. Bilateral PCA branches are within normal limits. Posterior communicating arteries are diminutive or absent.  Antegrade flow in both ICA siphons. Moderate to severe stenosis in the left vertical P each wrist ICA segment (series 703, image 15). Despite this, the left ICA remains patent to the terminus. Ophthalmic artery origins within normal limits. Normal right ICA terminus. MCA and ACA origins are within normal limits. Diminutive anterior communicating artery. Visualized ACA branches are within normal limits. Visualized bilateral MCA branches are within normal limits.  IMPRESSION: 1. Scattered small acute infarcts. About 6 lesions are identified in the posterior cerebral and cerebellar hemispheres. No associated mass effect or hemorrhage. 2. The pattern raises the possibility of a recent embolic event. Synchronous small vessel ischemia it is less likely. 3. Posterior circulation remarkable for poor flow in the distal right vertebral artery. This could be related to the acute findings. 4. Anterior circulation remarkable for high-grade stenosis of the left ICA siphon. 5. No major circle of Willis branch occlusion identified.   Electronically Signed   By: Lars Pinks M.D.   On: 06/10/2013 16:16   Ct Maxillofacial W/cm  06/10/2013   CLINICAL DATA:  Left-sided mastoid pain and left facial redness.  EXAM: CT MAXILLOFACIAL WITH CONTRAST  TECHNIQUE: Multidetector CT imaging of the maxillofacial structures was performed with intravenous contrast. Multiplanar CT image  reconstructions were also generated. A small metallic BB was placed on the right temple in order to reliably differentiate right from left.  CONTRAST:  19mL OMNIPAQUE IOHEXOL 300 MG/ML  SOLN  COMPARISON:  Head CT 05/25/2010  FINDINGS: There is chronic right maxillary sinus disease with mucoperiosteal thickening, fluid and mildly thickened sinus walls. The left maxillary sinus is clear. The frontal and ethmoid sinuses are clear. Both half of the sphenoid sinus are clear.  There are bilateral mastoid effusions and middle ear disease. No discrete abscess. The visualized portion of the brain is unremarkable. Chronic atrophy and ventriculomegaly are noted. The globes are intact. No neck mass or adenopathy. The parotid and submandibular glands are unremarkable. There are dense carotid artery calcifications noted bilaterally.  IMPRESSION: Chronic right maxillary sinusitis.  Bilateral mastoid effusions and middle ear disease.  No subcutaneous soft tissue abscess or significant cellulitis.   Electronically Signed   By: Kalman Jewels M.D.   On: 06/10/2013 13:38   Medications  0.9 %  sodium chloride infusion ( Intravenous New Bag/Given 06/10/13 1227)  iohexol (OMNIPAQUE) 300 MG/ML solution 100 mL (100 mLs Intravenous Contrast Given 06/10/13 1303)  LORazepam (ATIVAN) injection 0.5 mg (0.5 mg Intravenous Given 06/10/13 1454)  morphine 2 MG/ML injection 2 mg (2 mg Intravenous Given 06/10/13 1454)  ondansetron (ZOFRAN) injection 4 mg (4 mg Intravenous Given 06/10/13 1454)    Patient Vitals for the past 24 hrs:  BP Temp Temp src Pulse Resp SpO2  06/10/13 1544 172/85 mmHg 97.4 F (36.3 C) Oral 79 18 97 %  06/10/13 1431 159/81 mmHg - - 72 15 99 %  06/10/13 1131 138/73 mmHg 98.5 F (36.9 C) Oral 84 20 97 %      Date: 06/10/13  Rate: 86  Rhythm: normal sinus rhythm  QRS Axis: normal  PR and QT Intervals: normal  ST/T Wave abnormalities: nonspecific ST changes  PR and QRS Conduction Disutrbances:nonspecific  intraventricular conduction delay  Narrative Interpretation:   Old EKG Reviewed: changes noted- PVCs are new- 05/24/13    EKG Interpretation None      MDM   Final diagnoses:  CVA (cerebral infarction)  Otitis media, left  Mastoiditis of both sides  Maxillary sinusitis    Presentation with ear, pain, revealing infectious process causing this discomfort. He has been partially treated with a cephalosporin. Physical examination was abnormal, initially , suggesting a facial nerve palsy, peripheral; with respect to ear pain. Since the CT scan of the face did not show any temporal region tumor, an MRI of the brain was done, that reveals multiple embolic strokes. Patient will need to be admitted for further evaluation and  Treatment. He will be transferred to [facility] hospital.   Nursing Notes Reviewed/ Care Coordinated, and agree without changes. Applicable Imaging Reviewed.  Interpretation of Laboratory Data incorporated into ED treatment  Plan: Admit   Richarda Blade, MD 06/10/13 1710

## 2013-06-10 NOTE — ED Notes (Signed)
Pt to MRI at this time.

## 2013-06-10 NOTE — Progress Notes (Signed)
Pt vitals taken and placed on telemetry. P. Amo Oswald Pott Rn.

## 2013-06-10 NOTE — ED Notes (Signed)
Pt ambulatory with steady gait to void in BR.

## 2013-06-10 NOTE — ED Notes (Signed)
Pt brought back from MRI clutching L side of face/neck and screaming, 10/10 pain.  Dr. Eulis Foster aware and verbal orders obtained for medications.

## 2013-06-10 NOTE — ED Notes (Signed)
Initial Contact - pt resting on stretcher reports L sided facial/ear pain intermittently x3-4 weeks, no aggravating/alleviating factors.  10/10 when present.  Lasts approx 30 seconds.  Pt denies pain at this time.  Pt reports has been seen by doctor's for the same complaint with only dx an ear infection "but i think i had a stroke".  A+Ox4.  Speaking full/clear sentences.  L eye appears drooped and reddened.  L sided tongue deviation.  LS congested, pt reports this is normal for him.  Pt denies cp/sob.  Skin PWD.  MAEI, +csm/+pulses.  No pronator drift.

## 2013-06-10 NOTE — ED Notes (Signed)
Pt reports pain subsided at this time, med per [facility], and back to MRI.  NAD.

## 2013-06-10 NOTE — Progress Notes (Signed)
Pt arrived by EMS to the unit at 1820 via a stretcher. Pt A&O x4, MAE x4, pt HOH, skin flaky and dry, no pressure ulcer noted except dry dsg to right foot which pt said is an unhealing ulcer which two of his toes are amputated and someone comes home to do dsg changes. Pt IV remains intact to right upper arm and infusing. Pt assisted to bedside to use urinal with 225ml output, clear urine. Pt noted to be unsteady, ataxia; pt oriented to the units, in bed with call light within reach. Pt noted to be impulsive and needed a sitter for safety reasons. Charge RN Vicente Males informed. Pt in bed with call light within reach and bed alarm on. Will report off to incoming RN. Francis Gaines Graylin Sperling RN.

## 2013-06-10 NOTE — ED Notes (Signed)
Pt ret from MRI at this time, per MRI tech pt refused exam.  Pt sts "it took too long and it was too loud, that's for the animals".  Dr. Eulis Foster aware.

## 2013-06-10 NOTE — ED Notes (Signed)
Coffee given to pt per request with Dr. Eulis Foster verbal ok.

## 2013-06-10 NOTE — ED Notes (Signed)
Pt confused and getting out of bed.  Pulled #3 IV out.  Pt redirected to room, assisted w/ use of urinal, given a sandwich and coffee in hopes that pt would stay in bed.

## 2013-06-10 NOTE — ED Notes (Signed)
Bed: WA01 Expected date:  Expected time:  Means of arrival:  Comments: "episodes of discomfort every hour for 20 seconds"

## 2013-06-10 NOTE — Progress Notes (Signed)
Internal Medicine Resident Dr. Algis Liming paged and notified of pt's arrival to the floor at 1937. Reported off to incoming [place].

## 2013-06-10 NOTE — Consult Note (Signed)
Referring Physician: [facility]    Chief Complaint: left face pain/numbness, imbalance, stroke on MRI  HPI:                                                                                                                                         Paul [name] is an 80 y.o. male with a past medical history significant for HTN, partial complex seizures, COPD, hearing impairment, lung cancer, transferred to [facility] for further evaluation of stroke. He is hard of hearing and seems to be a poor historian, thus all clinical information is obtained from the chart. Paul [name] has been experiencing pain and numbness in the left side of his face intermittently x3-4 weeks which last only 30-40 seconds.Pt reports has been seen by doctor's for the same complaint with only dx an ear infection "but i think i had a stroke. He denies HA, vertigo, double vision, focal weakness, speech changes, language or vision impairment. MRI brain was obtained at [facility] and revealed " scattered small acute infarcts. About 6 lesions are identified in  the posterior cerebral and cerebellar hemispheres". MRA brain poor flow in the distal right vertebral artery. This could be related to the acute findings. Anterior circulation remarkable for high-grade stenosis of the left ICA siphon. He takes dilantin and coumadin (unclear reason). No atrial fibrillation noted on EKG. INR 1.02 Date last known well: uncertain  Time last known well: uncertain tPA Given: no, late presentation   Past Medical History  Diagnosis Date  . Hypertension   . COPD (chronic obstructive pulmonary disease)   . Hemorrhoids     external  . 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 GERD (gastroesophageal reflux disease)   . Emphysema   . HOH (hard of hearing)   . Non-small cell carcinoma of lung dx'd 2006    stage 3  . Lung cancer   . Pneumonia   . Seizures     Past Surgical History  Procedure Laterality Date  . Inner ear surgery    . Toe  amputation Left   . Tonsillectomy    . Amputation Left 04/16/2013    Procedure: AMPUTATION RAY;  Surgeon: Newt Minion, MD;  Location: [place];  Service: Orthopedics;  Laterality: Left;  Left Foot 4th and 5th Ray Amputation    No family history on file. Social History:  reports that he has been smoking Cigarettes.  He has a 90 pack-year smoking history. He has never used smokeless tobacco. He reports that he does not drink alcohol or use illicit drugs.  Allergies:  Allergies  Allergen Reactions  . No Known Allergies     Medications:  I have reviewed the patient's current medications.  ROS: patient is very hard of hearing and will defer at this moment.                                                                              History obtained from chart review  Physical exam: pleasant male in no apparent distress. Blood pressure 110/71, pulse 81, temperature 97.5 F (36.4 C), temperature source Oral, resp. rate 18, SpO2 95.00%. Head: normocephalic. Neck: supple, no bruits, no JVD. Cardiac: no murmurs. Lungs: clear. Abdomen: soft, no tender, no mass. Extremities: no edema.  Neurologic Examination:                                                                                                      Mental Status: Alert, oriented, thought content appropriate.  Speech fluent without evidence of aphasia.  Able to follow 3 step commands without difficulty. Cranial Nerves: II: Discs flat bilaterally; Visual fields grossly normal, pupils equal, round, reactive to light and accommodation III,IV, VI: ptosis not present, extra-ocular motions intact bilaterally V,VII: smile asymmetric due to left face weakness, facial light touch sensation normal bilaterally VIII: hearing normal bilaterally IX,X: gag reflex present XI: bilateral shoulder shrug XII: midline tongue  extension without atrophy or fasciculations  Motor: Moves all limbs symmetrically Tone and bulk:normal tone throughout; no atrophy noted Sensory: Pinprick and light touch intact throughout, bilaterally Deep Tendon Reflexes:  1 all over Plantars: Right: downgoing   Left: no tested Cerebellar: normal finger-to-nose,  normal heel-to-shin test Gait:  No tested   Results for orders placed during the hospital encounter of 06/10/13 (from the past 48 hour(s))  CBC WITH DIFFERENTIAL     Status: Abnormal   Collection Time    06/10/13 12:25 PM      Result Value Ref Range   WBC 5.8  4.0 - 10.5 K/uL   RBC 4.93  4.22 - 5.81 MIL/uL   Hemoglobin 14.4  13.0 - 17.0 g/dL   HCT 42.3  39.0 - 52.0 %   MCV 85.8  78.0 - 100.0 fL   MCH 29.2  26.0 - 34.0 pg   MCHC 34.0  30.0 - 36.0 g/dL   RDW 13.6  11.5 - 15.5 %   Platelets 193  150 - 400 K/uL   Neutrophils Relative % 78 (*) 43 - 77 %   Neutro Abs 4.5  1.7 - 7.7 K/uL   Lymphocytes Relative 11 (*) 12 - 46 %   Lymphs Abs 0.7  0.7 - 4.0 K/uL   Monocytes Relative 9  3 - 12 %   Monocytes Absolute 0.5  0.1 - 1.0 K/uL   Eosinophils Relative 1  0 - 5 %   Eosinophils Absolute 0.1  0.0 - 0.7 K/uL   Basophils  Relative 1  0 - 1 %   Basophils Absolute 0.0  0.0 - 0.1 K/uL  PROTIME-INR     Status: None   Collection Time    06/10/13 12:25 PM      Result Value Ref Range   Prothrombin Time 13.2  11.6 - 15.2 seconds   INR 1.02  0.00 - 1.49  I-STAT CHEM 8, ED     Status: Abnormal   Collection Time    06/10/13 12:35 PM      Result Value Ref Range   Sodium 137  137 - 147 mEq/L   Potassium 3.8  3.7 - 5.3 mEq/L   Chloride 98  96 - 112 mEq/L   BUN 9  6 - 23 mg/dL   Creatinine, Ser 0.80  0.50 - 1.35 mg/dL   Glucose, Bld 111 (*) 70 - 99 mg/dL   Calcium, Ion 1.19  1.13 - 1.30 mmol/L   TCO2 30  0 - 100 mmol/L   Hemoglobin 14.6  13.0 - 17.0 g/dL   HCT 43.0  39.0 - 52.0 %   Mr Paul [name] Wo Contrast  06/10/2013   CLINICAL DATA:  80 year old male with new onset  left facial droop. Left facial pain with recent year and mastoid pain. Initial encounter. History of lung cancer.  EXAM: MRI HEAD WITHOUT CONTRAST  MRA HEAD WITHOUT CONTRAST  TECHNIQUE: Multiplanar, multiecho pulse sequences of the brain and surrounding structures were obtained without intravenous contrast. Angiographic images of the head were obtained using MRA technique without contrast.  COMPARISON:  Face CT with contrast from the same day. Head CT without contrast 05/24/2013 and earlier.  FINDINGS: MRI HEAD FINDINGS  The examination had to be discontinued prior to completion due to the patient's condition.  Axial in coronal diffusion weighted images demonstrate about 6 small areas of restricted diffusion occurring in the bilateral posterior hemispheres and bilateral cerebellar hemispheres (series 4 and series 5). The largest and most intense of these areas are in the left occipital periventricular white matter (7 mm, series 4, image 16) and left posterior cerebellar hemisphere (6 mm image 7).  No associated mass effect or hemorrhage. Mild associated T2 hyperintensity.  Major intracranial vascular flow voids are preserved except for the distal right vertebral artery, see below.  No midline shift, mass effect, or evidence of intracranial mass lesion. Generalized cerebral volume loss. No ventriculomegaly. No acute or chronic blood products identified. Patchy cerebral white matter T2 and FLAIR hyperintensity, nonspecific. Negative pituitary, cervicomedullary junction and visualized cervical spine.  Visualized orbit soft tissues are within normal limits. Fluid level and mucosal thickening in the right maxillary sinus. Bilateral mastoid fluid. Small volume retained secretions in the nasopharynx. Bilateral seventh nerves at the stylomastoid foramina appear normal. Visualized parotid glands are within normal limits. Normal bone marrow signal.  MRA HEAD FINDINGS  There is antegrade flow in the distal right vertebral  artery, but the signal is diminished compared to that on the left. Antegrade flow in the distal left vertebral artery appears normal. The basilar artery is patent without stenosis. AICA, SCA, and PCA origins are within normal limits. Bilateral PCA branches are within normal limits. Posterior communicating arteries are diminutive or absent.  Antegrade flow in both ICA siphons. Moderate to severe stenosis in the left vertical P each wrist ICA segment (series 703, image 15). Despite this, the left ICA remains patent to the terminus. Ophthalmic artery origins within normal limits. Normal right ICA terminus. MCA and ACA origins are within normal limits.  Diminutive anterior communicating artery. Visualized ACA branches are within normal limits. Visualized bilateral MCA branches are within normal limits.  IMPRESSION: 1. Scattered small acute infarcts. About 6 lesions are identified in the posterior cerebral and cerebellar hemispheres. No associated mass effect or hemorrhage. 2. The pattern raises the possibility of a recent embolic event. Synchronous small vessel ischemia it is less likely. 3. Posterior circulation remarkable for poor flow in the distal right vertebral artery. This could be related to the acute findings. 4. Anterior circulation remarkable for high-grade stenosis of the left ICA siphon. 5. No major circle of Willis branch occlusion identified.   Electronically Signed   By: Lars Pinks M.D.   On: 06/10/2013 16:16   Mr Brain Wo Contrast  06/10/2013   CLINICAL DATA:  80 year old male with new onset left facial droop. Left facial pain with recent year and mastoid pain. Initial encounter. History of lung cancer.  EXAM: MRI HEAD WITHOUT CONTRAST  MRA HEAD WITHOUT CONTRAST  TECHNIQUE: Multiplanar, multiecho pulse sequences of the brain and surrounding structures were obtained without intravenous contrast. Angiographic images of the head were obtained using MRA technique without contrast.  COMPARISON:  Face CT  with contrast from the same day. Head CT without contrast 05/24/2013 and earlier.  FINDINGS: MRI HEAD FINDINGS  The examination had to be discontinued prior to completion due to the patient's condition.  Axial in coronal diffusion weighted images demonstrate about 6 small areas of restricted diffusion occurring in the bilateral posterior hemispheres and bilateral cerebellar hemispheres (series 4 and series 5). The largest and most intense of these areas are in the left occipital periventricular white matter (7 mm, series 4, image 16) and left posterior cerebellar hemisphere (6 mm image 7).  No associated mass effect or hemorrhage. Mild associated T2 hyperintensity.  Major intracranial vascular flow voids are preserved except for the distal right vertebral artery, see below.  No midline shift, mass effect, or evidence of intracranial mass lesion. Generalized cerebral volume loss. No ventriculomegaly. No acute or chronic blood products identified. Patchy cerebral white matter T2 and FLAIR hyperintensity, nonspecific. Negative pituitary, cervicomedullary junction and visualized cervical spine.  Visualized orbit soft tissues are within normal limits. Fluid level and mucosal thickening in the right maxillary sinus. Bilateral mastoid fluid. Small volume retained secretions in the nasopharynx. Bilateral seventh nerves at the stylomastoid foramina appear normal. Visualized parotid glands are within normal limits. Normal bone marrow signal.  MRA HEAD FINDINGS  There is antegrade flow in the distal right vertebral artery, but the signal is diminished compared to that on the left. Antegrade flow in the distal left vertebral artery appears normal. The basilar artery is patent without stenosis. AICA, SCA, and PCA origins are within normal limits. Bilateral PCA branches are within normal limits. Posterior communicating arteries are diminutive or absent.  Antegrade flow in both ICA siphons. Moderate to severe stenosis in the left  vertical P each wrist ICA segment (series 703, image 15). Despite this, the left ICA remains patent to the terminus. Ophthalmic artery origins within normal limits. Normal right ICA terminus. MCA and ACA origins are within normal limits. Diminutive anterior communicating artery. Visualized ACA branches are within normal limits. Visualized bilateral MCA branches are within normal limits.  IMPRESSION: 1. Scattered small acute infarcts. About 6 lesions are identified in the posterior cerebral and cerebellar hemispheres. No associated mass effect or hemorrhage. 2. The pattern raises the possibility of a recent embolic event. Synchronous small vessel ischemia it is less likely. 3. Posterior  circulation remarkable for poor flow in the distal right vertebral artery. This could be related to the acute findings. 4. Anterior circulation remarkable for high-grade stenosis of the left ICA siphon. 5. No major circle of Willis branch occlusion identified.   Electronically Signed   By: Lars Pinks M.D.   On: 06/10/2013 16:16   Ct Maxillofacial W/cm  06/10/2013   CLINICAL DATA:  Left-sided mastoid pain and left facial redness.  EXAM: CT MAXILLOFACIAL WITH CONTRAST  TECHNIQUE: Multidetector CT imaging of the maxillofacial structures was performed with intravenous contrast. Multiplanar CT image reconstructions were also generated. A small metallic BB was placed on the right temple in order to reliably differentiate right from left.  CONTRAST:  118mL OMNIPAQUE IOHEXOL 300 MG/ML  SOLN  COMPARISON:  Head CT 05/25/2010  FINDINGS: There is chronic right maxillary sinus disease with mucoperiosteal thickening, fluid and mildly thickened sinus walls. The left maxillary sinus is clear. The frontal and ethmoid sinuses are clear. Both half of the sphenoid sinus are clear.  There are bilateral mastoid effusions and middle ear disease. No discrete abscess. The visualized portion of the brain is unremarkable. Chronic atrophy and ventriculomegaly  are noted. The globes are intact. No neck mass or adenopathy. The parotid and submandibular glands are unremarkable. There are dense carotid artery calcifications noted bilaterally.  IMPRESSION: Chronic right maxillary sinusitis.  Bilateral mastoid effusions and middle ear disease.  No subcutaneous soft tissue abscess or significant cellulitis.   Electronically Signed   By: Kalman Jewels M.D.   On: 06/10/2013 13:38     Assessment: 80 y.o. male, hard of hearing and poor historian, with several areas of small acute infarcts left posterior cerebral and cerebellar hemispheres suggesting embolism. Has left face weakness but no other obvious neurological signs on exam He is on coumadin for unclear reasons and has a sub-therapeutic INR. Although I am tempted to switch to a NOAC that will make him therapeutic quickly, I don't know the indication for him being on coumadin and thus will ask for a pharmacy consult to manage his coumadin in the meantime. Complete stroke work up. Stroke team will resume care tomorrow.  Stroke Risk Factors - age,HTN  Plan: 1. HgbA1c, fasting lipid panel 2. MRI, MRA  of the brain without contrast 3. Echocardiogram 4. Carotid dopplers 5. Prophylactic therapy-coumadin 6. Risk factor modification 7. Telemetry monitoring 8. Frequent neuro checks 9. PT/OT SLP   Dorian Pod, MD [facility] [phone]  06/10/2013, 6:59 PM

## 2013-06-11 DIAGNOSIS — Z91199 Patient's noncompliance with other medical treatment and regimen due to unspecified reason: Secondary | ICD-10-CM

## 2013-06-11 DIAGNOSIS — Z9119 Patient's noncompliance with other medical treatment and regimen: Secondary | ICD-10-CM

## 2013-06-11 DIAGNOSIS — I10 Essential (primary) hypertension: Secondary | ICD-10-CM

## 2013-06-11 DIAGNOSIS — I059 Rheumatic mitral valve disease, unspecified: Secondary | ICD-10-CM

## 2013-06-11 DIAGNOSIS — I6340 Cerebral infarction due to embolism of unspecified cerebral artery: Principal | ICD-10-CM

## 2013-06-11 DIAGNOSIS — Z85118 Personal history of other malignant neoplasm of bronchus and lung: Secondary | ICD-10-CM

## 2013-06-11 DIAGNOSIS — J449 Chronic obstructive pulmonary disease, unspecified: Secondary | ICD-10-CM

## 2013-06-11 DIAGNOSIS — H9209 Otalgia, unspecified ear: Secondary | ICD-10-CM

## 2013-06-11 DIAGNOSIS — J329 Chronic sinusitis, unspecified: Secondary | ICD-10-CM

## 2013-06-11 LAB — HEMOGLOBIN A1C
Hgb A1c MFr Bld: 5.7 % — ABNORMAL HIGH (ref <5.7)
Mean Plasma Glucose: 117 mg/dL — ABNORMAL HIGH (ref <117)

## 2013-06-11 LAB — LIPID PANEL
CHOL/HDL RATIO: 4 ratio
Cholesterol: 159 mg/dL (ref 0–200)
HDL: 40 mg/dL (ref >39)
LDL CALC: 94 mg/dL (ref 0–99)
Triglycerides: 124 mg/dL (ref <150)
VLDL: 25 mg/dL (ref 0–40)

## 2013-06-11 LAB — PROTIME-INR
INR: 0.98 (ref 0.00–1.49)
PROTHROMBIN TIME: 12.8 s (ref 11.6–15.2)

## 2013-06-11 MED ORDER — ASPIRIN 325 MG PO TABS
325.0000 mg | ORAL_TABLET | Freq: Every day | ORAL | Status: DC
  Administered 2013-06-11: 325 mg via ORAL
  Filled 2013-06-11: qty 1

## 2013-06-11 MED ORDER — ENOXAPARIN SODIUM 40 MG/0.4ML ~~LOC~~ SOLN
40.0000 mg | SUBCUTANEOUS | Status: DC
  Administered 2013-06-11: 40 mg via SUBCUTANEOUS
  Filled 2013-06-11: qty 0.4

## 2013-06-11 MED ORDER — WARFARIN SODIUM 5 MG PO TABS: 5.0000 mg | ORAL_TABLET | Freq: Every day | ORAL | Status: DC

## 2013-06-11 MED ORDER — ASPIRIN EC 81 MG PO TBEC: 81.0000 mg | DELAYED_RELEASE_TABLET | Freq: Every day | ORAL | Status: DC

## 2013-06-11 MED ORDER — WARFARIN SODIUM 7.5 MG PO TABS
7.5000 mg | ORAL_TABLET | Freq: Once | ORAL | Status: DC
  Filled 2013-06-11: qty 1

## 2013-06-11 MED ORDER — AMOXICILLIN-POT CLAVULANATE 875-125 MG PO TABS: 1.0000 | ORAL_TABLET | Freq: Two times a day (BID) | ORAL | Status: DC

## 2013-06-11 MED ORDER — ATORVASTATIN CALCIUM 40 MG PO TABS: 40.0000 mg | ORAL_TABLET | Freq: Every day | ORAL | Status: DC

## 2013-06-11 MED ORDER — CIPROFLOXACIN-DEXAMETHASONE 0.3-0.1 % OT SUSP: 4.0000 [drp] | Freq: Two times a day (BID) | OTIC | Status: DC

## 2013-06-11 MED ORDER — ATORVASTATIN CALCIUM 40 MG PO TABS
40.0000 mg | ORAL_TABLET | Freq: Every day | ORAL | Status: DC
  Filled 2013-06-11: qty 1

## 2013-06-11 NOTE — Progress Notes (Signed)
CARE MANAGEMENT NOTE 06/11/2013  Patient:  Paul [name], Paul [name]   Account Number:  [id number]  Date Initiated:  06/11/2013  Documentation initiated by:  Olga Coaster  Subjective/Objective Assessment:   ADMITTED FOR STROKE WORKUP     Action/Plan:   CM FOLLOWING FOR DCP   Anticipated DC Date:  06/16/2013   Anticipated DC Plan:  AWAITING FOR PT/OT EVALS FOR DISPOSITION NEEDS WITH ATTENDING MD APPROVAL     DC Planning Services  CM consult              Status of service:  In process, Paul [name] continue to follow Medicare Important Message given?  NA - LOS <3 / Initial given by admissions (If response is "NO", the following Medicare IM given date fields Paul [name] be blank)  Per UR Regulation:  Reviewed for med. necessity/level of care/duration of stay  Comments:  5/22/2015[facility] RN,BSN,MHA [phone]

## 2013-06-11 NOTE — Progress Notes (Signed)
Stroke Team Progress Note   SUBJECTIVE His sitter is at the bedside.  Overall he feels his condition is stable. He is having some "headache spells", otherwise is anxious for discharge  OBJECTIVE Most recent Vital Signs: Filed Vitals:   06/11/13 0239 06/11/13 0500 06/11/13 0743 06/11/13 1101  BP: 114/61 126/82 135/66 155/85  Pulse: 86 88 85 80  Temp: 98 F (36.7 C) 97.3 F (36.3 C) 98 F (36.7 C) 98.6 F (37 C)  TempSrc: Oral Oral Oral Oral  Resp: 18 16 16 18   Height:      Weight:      SpO2: 95% 97% 100% 100%   CBG (last 3)  No results found for this basename: GLUCAP,  in the last 72 hours  IV Fluid Intake:   . sodium chloride 125 mL/hr at 06/10/13 1227    MEDICATIONS  . sodium chloride   Intravenous STAT  . amoxicillin-clavulanate  1 tablet Oral Q12H  . aspirin  325 mg Oral Daily  . atorvastatin  40 mg Oral q1800  . ciprofloxacin-dexamethasone  4 drop Both Ears BID  . enoxaparin (LOVENOX) injection  40 mg Subcutaneous Q24H  . fluticasone  2 spray Each Nare Daily  . phenytoin  300 mg Oral QHS  . warfarin  7.5 mg Oral ONCE-1800  . Warfarin - Pharmacist Dosing Inpatient   Does not apply q1800   PRN:  acetaminophen, senna-docusate  CLINICALLY SIGNIFICANT STUDIES Basic Metabolic Panel:  Recent Labs Lab 06/10/13 1235  NA 137  K 3.8  CL 98  GLUCOSE 111*  BUN 9  CREATININE 0.80   Liver Function Tests: No results found for this basename: AST, ALT, ALKPHOS, BILITOT, PROT, ALBUMIN,  in the last 168 hours CBC:  Recent Labs Lab 06/10/13 1225 06/10/13 1235  WBC 5.8  --   NEUTROABS 4.5  --   HGB 14.4 14.6  HCT 42.3 43.0  MCV 85.8  --   PLT 193  --    Coagulation:  Recent Labs Lab 06/10/13 1225 06/11/13 0810  LABPROT 13.2 12.8  INR 1.02 0.98   Cardiac Enzymes: No results found for this basename: CKTOTAL, CKMB, CKMBINDEX, TROPONINI,  in the last 168 hours Urinalysis: No results found for this basename: COLORURINE, APPERANCEUR, LABSPEC, PHURINE, GLUCOSEU,  HGBUR, BILIRUBINUR, KETONESUR, PROTEINUR, UROBILINOGEN, NITRITE, LEUKOCYTESUR,  in the last 168 hours Lipid Panel    Component Value Date/Time   CHOL 159 06/11/2013 0810   TRIG 124 06/11/2013 0810   HDL 40 06/11/2013 0810   CHOLHDL 4.0 06/11/2013 0810   VLDL 25 06/11/2013 0810   LDLCALC 94 06/11/2013 0810   HgbA1C  No results found for this basename: HGBA1C    Urine Drug Screen:     Component Value Date/Time   LABOPIA NONE DETECTED 04/01/2013 1858   COCAINSCRNUR NONE DETECTED 04/01/2013 1858   LABBENZ NONE DETECTED 04/01/2013 1858   AMPHETMU NONE DETECTED 04/01/2013 1858   THCU NONE DETECTED 04/01/2013 1858   LABBARB NONE DETECTED 04/01/2013 1858    Alcohol Level: No results found for this basename: ETH,  in the last 168 hours   Ct Maxillofacial W/cm   06/10/2013   Chronic right maxillary sinusitis.  Bilateral mastoid effusions and middle ear disease.  No subcutaneous soft tissue abscess or significant cellulitis.     MRI of the brain  06/10/2013    1. Scattered small acute infarcts. About 6 lesions are identified in the posterior cerebral and cerebellar hemispheres. No associated mass effect or hemorrhage. 2. The pattern  raises the possibility of a recent embolic event. Synchronous small vessel ischemia it is less likely.  MRA of the brain  06/10/2013    . 3. Posterior circulation remarkable for poor flow in the distal right vertebral artery. This could be related to the acute findings. 4. Anterior circulation remarkable for high-grade stenosis of the left ICA siphon. 5. No major circle of Willis branch occlusion identified.  2D Echocardiogram  EF 55-60% with no source of embolus.   Carotid Doppler  No evidence of hemodynamically significant internal carotid artery stenosis. Vertebral artery flow is antegrade.   CXR  06/11/2013   1. Severe emphysema with post radiation therapy related scarring in the right upper lobe. Mild scarring along the left basilar bulla. Currently no acute findings or  evidence of aspiration pneumonitis.    EKG  normal sinus rhythm, RBBB. For complete results please see formal report.   Therapy Recommendations HH PT  Physical Exam   Frail elderly Caucasian male not in distress.Awake alert. Afebrile. Head is nontraumatic. Neck is supple without bruit. Hearing is poor .Marland Kitchen Cardiac exam no murmur or gallop. Lungs are clear to auscultation. Distal pulses are well felt. Neurological Exam : Awake alert oriented x3. Severe bilateral hearing loss limits cognitive evaluation. Follows commands well. Speech appears normal. Hearing is decreased right greater than left. Tongue midline. Extraocular movements full range. Mild saccadic dysmetria when he looks to the left. No upper or lower extremity drift. Mild left finger-to-nose and knee to his ataxia. Mild truncal ataxia and insula left. No focal weakness. Gait not tested. ASSESSMENT Mr. CROCKETT RALLO is a 80 y.o. male presenting with left face pain/numbness, imbalance. Imaging confirms a bilateral scattered infarcts. Infarct felt to be embolic secondary to hypercaogulability from lung cancer in setting of previous PE and DVTs.  On warfarin prior to admissionl INR subtherapeutic 1.02. Now on warfarin for secondary stroke prevention. Patient with resultant ataxia. Stroke work up completed.  Hospital day # 1  TREATMENT/PLAN  Agree no TEE indicated  Agree with continuing warfarin for secondary stroke prevention.  No further stroke workup indicated. Patient has a 10-15% risk of having another stroke over the next year, the highest risk is within 2 weeks of the most recent stroke/TIA (risk of having a stroke following a stroke or TIA is the same). Ongoing risk factor control by Primary Care Physician Stroke Service will sign off. Please call should any needs arise. Follow up with Dr. Leonie Man, [facility], in 2 months.  Burnetta Sabin, MSN, RN, ANVP-BC, AGPCNP-BC [facility] Pager: [phone] 06/11/2013 11:28  AM  I have personally obtained a history, examined the patient, evaluated imaging results, and formulated the assessment and plan of care. I agree with the above. Antony Contras, MD   To contact Stroke Continuity provider, please refer to [email or web address] After hours, contact General Neurology

## 2013-06-11 NOTE — Progress Notes (Signed)
OT Cancellation Note  Patient Details Name: Paul [name] MRN: [id number] DOB: 1930/08/06   Cancelled Treatment:    Reason Eval/Treat Not Completed: Patient at procedure or test/ unavailable. OT will follow-up with pt later for ADL assessment.  [name] [phone] 06/11/2013, 10:00 AM

## 2013-06-11 NOTE — ED Provider Notes (Signed)
Medical screening examination/treatment/procedure(s) were performed by non-physician practitioner and as supervising physician I was immediately available for consultation/collaboration.   EKG Interpretation None        Ephraim Hamburger, MD 06/11/13 [phone]

## 2013-06-11 NOTE — Progress Notes (Signed)
Patient given discharge teaching and paperwork regarding medications, diet, follow-up appointments and activity. Patient understanding verbalized. IV site discontinued. Telemetry monitor removed. Skin assessment as previously charted and vitals are stable. Patient being discharged to home.

## 2013-06-11 NOTE — Progress Notes (Signed)
  Echocardiogram 2D Echocardiogram has been performed. This was a technically difficult study due to patient's noncompliance.  Valinda Hoar 06/11/2013, 9:34 AM

## 2013-06-11 NOTE — Progress Notes (Signed)
Throughout the night patient has had three episodes of severe headache/left facial pain. During each pain episode patient awakens from sleep, becomes very agitated and trys to get out of bed. Pt also franticly rubs his head until the pain subsides. Each episode last around three minutes. Afterward pt reports that pain is almost completely gone and pt is able to immediately go back to sleep.  Md notified. RN will continue to monitor.

## 2013-06-11 NOTE — Evaluation (Signed)
Physical Therapy Evaluation Patient Details Name: Paul [name] MRN: [id number] DOB: [birth year] Today's Date: 06/11/2013   History of Present Illness  80 y.o. male admitted to [facility] on 06/10/13 with facial pain and left ear pain.  MRI revealed Scattered small acute infarcts. About 6 lesions are identified in the posterior cerebral and cerebellar hemispheres.  Pt with significant PMHx of COPD, lung CA, HTN, HOH (L ear better, no hearing aids), seizure, L toe and ray amputation (for which he is currently active with [facility] RN for wound care and wears a post-op shoe).    Clinical Impression  Pt seems to be pretty close to his normal baseline level of mobility, which per pt report is "staggery".  I advised him to use his RW full time now, not just when he feels he needs it.  He did not want any home therapy f/u as he believes he is supposed to be unsteady at 82.  He is agreeable for the RN to continue to come out to his home and check on his wound.  PT will follow acutely.     Follow Up Recommendations Home health PT;Supervision - Intermittent (pt is refusing HHPT)    Equipment Recommendations  None recommended by PT    Recommendations for Other Services   NA     Precautions / Restrictions Precautions Precautions: Fall Precaution Comments: h/o falls and stumbles.  "I am staggery, but I am also 82!" Required Braces or Orthoses: Other Brace/Splint Other Brace/Splint: post-op shoe left Restrictions Weight Bearing Restrictions: No      Mobility  Bed Mobility Overal bed mobility: Modified Independent             General bed mobility comments: using railing for leverage  Transfers Overall transfer level: Needs assistance Equipment used: None Transfers: Sit to/from Stand Sit to Stand: Supervision         General transfer comment: supervision for safety as pt is quick to move and uses his hands during transitions  Ambulation/Gait Ambulation/Gait assistance: Supervision Ambulation  Distance (Feet): 90 Feet Assistive device: None Gait Pattern/deviations: Step-through pattern;Staggering left;Staggering right Gait velocity: very quick- a bit too fast to be safe   General Gait Details: Pt does have abnormal gait pattern and poor fitting shoes.  He is quick to move and I advised him it would be better to use an assistive device right now (RW preferred, but any better than none).  He does have increased DOE with gait, but reports this is his baseline and he continues to smoke.  O2 sats 94% on RA during gait despite 2/4 DOE level.  Pt avoids bending over because this makes him dizzy and when he is particularly dizzy he uses his RW at home.      Modified Rankin (Stroke Patients Only) Modified Rankin (Stroke Patients Only) Pre-Morbid Rankin Score: Slight disability Modified Rankin: Moderately severe disability     Balance Overall balance assessment: History of Falls;Needs assistance Sitting-balance support: Feet supported Sitting balance-Leahy Scale: Good Sitting balance - Comments: can bend over to put shoes and sock on in sitting, avoided bending over in standing in hallway.   Standing balance support: No upper extremity supported Standing balance-Leahy Scale: Fair Standing balance comment: signs of imbalance, but no significant LOB during gait requiring external assist.  He would look more stable with RW or cane.  Pertinent Vitals/Pain No pain or HA reported.  DOE 2/4 with gait with O2 sats 94% on RA.  Per pt DOE is his baseline and limits his gait distance normally.      Home Living Family/patient expects to be discharged to:: Private residence Living Arrangements: Alone Available Help at Discharge: Family;Available PRN/intermittently ("checks in") Type of Home: Apartment Home Access: Level entry     Home Layout: One level Home Equipment: Walker - 2 wheels;Cane - single point      Prior Function Level of Independence:  Independent with assistive device(s)         Comments: pt still drives     Hand Dominance        Extremity/Trunk Assessment   Upper Extremity Assessment: Defer to OT evaluation           Lower Extremity Assessment: Overall WFL for tasks assessed (per seated MMT 4+-5/5)      Cervical / Trunk Assessment: Normal  Communication   Communication: HOH (left ear is his best ear)  Cognition Arousal/Alertness: Awake/alert Behavior During Therapy: Impulsive Overall Cognitive Status: No family/caregiver present to determine baseline cognitive functioning                      General Comments General comments (skin integrity, edema, etc.): modified dix hallpike (-) for peripheral vertigo.  He does show signs of nystagmus with occulomotor testing, but this is likely central in nature.           Assessment/Plan    PT Assessment Patient needs continued PT services  PT Diagnosis Difficulty walking;Abnormality of gait   PT Problem List Decreased balance;Decreased mobility;Decreased knowledge of use of DME;Decreased safety awareness;Cardiopulmonary status limiting activity  PT Treatment Interventions Gait training;DME instruction;Functional mobility training;Therapeutic activities;Therapeutic exercise;Balance training;Neuromuscular re-education;Patient/family education   PT Goals (Current goals can be found in the Care Plan section) Acute Rehab PT Goals Patient Stated Goal: to go home PT Goal Formulation: With patient Time For Goal Achievement: 06/25/13 Potential to Achieve Goals: Good    Frequency Min 4X/week   Barriers to discharge Decreased caregiver support pt lives alone       End of Session Equipment Utilized During Treatment: Gait belt Activity Tolerance: Patient limited by fatigue (limited by DOe) Patient left: in chair;with call bell/phone within reach;with nursing/sitter in room Nurse Communication: Mobility status;Other (comment) (pt refusing HHPT, ok  with HHRN resuming)         Time: 6195-0932 PT Time Calculation (min): 25 min   Charges:   PT Evaluation $Initial PT Evaluation Tier I: 1 Procedure PT Treatments $Gait Training: 8-22 mins        Javien Tesch B. [place], [place], DPT [phone]   06/11/2013, 1:29 PM

## 2013-06-11 NOTE — Evaluation (Signed)
SLP Cancellation Note  Patient Details Name: Paul [name] MRN: [id number] DOB: [birth year]   Cancelled treatment:       Reason Eval/Treat Not Completed: Patient at procedure or test/unavailable  Will continue efforts.   [place], [place] [facility] SLP [phone]

## 2013-06-11 NOTE — Evaluation (Signed)
Speech Language Pathology Evaluation Patient Details Name: Paul [name] MRN: [id number] DOB: 10/04/1930 Today's Date: 06/11/2013 Time: 7829-5621 SLP Time Calculation (min): 34 min  Problem List:  Patient Active Problem List   Diagnosis Date Noted  . CVA (cerebral infarction) 06/10/2013  . Acute CVA (cerebrovascular accident) 06/10/2013  . Syncope 04/01/2013  . Accidental drug overdose--narcotics 04/01/2013  . Chronic osteomyelitis of left foot s/p toe amputation and ischemic changes 04/01/2013  . Acute encephalopathy 04/01/2013  . Frequent falls 07/15/2012  . Mass of right ear canal 07/15/2012  . Dysphagia 01/16/2012  . Cellulitis of left lower extremity 01/16/2012  . PERSONAL HISTORY, VENOUS THROMBOSIS AND EMBOLISM 03/23/2008  . DVT 12/03/2006  . INSOMNIA 12/03/2006  . TOBACCO ABUSE 01/02/2006  . HYPERTENSION 01/02/2006  . EXTERNAL HEMORRHOIDS 01/02/2006  . COPD, MILD 01/02/2006  . GASTROESOPHAGEAL REFLUX DISEASE 01/02/2006  . BURSITIS, SHOULDER 01/02/2006  . SEIZURE DISORDER 01/02/2006  . ALCOHOL ABUSE, HX OF 01/02/2006  . CARCINOMA, LUNG, SQUAMOUS CELL 02/07/2004   Past Medical History:  Past Medical History  Diagnosis Date  . Hypertension   . COPD (chronic obstructive pulmonary disease)   . Hemorrhoids     external  . 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 GERD (gastroesophageal reflux disease)   . Emphysema   . HOH (hard of hearing)   . Non-small cell carcinoma of lung dx'd 2006    stage 3  . Lung cancer   . Pneumonia   . Seizures    Past Surgical History:  Past Surgical History  Procedure Laterality Date  . Inner ear surgery    . Toe amputation Left   . Tonsillectomy    . Amputation Left 04/16/2013    Procedure: AMPUTATION RAY;  Surgeon: Newt Minion, MD;  Location: [facility];  Service: Orthopedics;  Laterality: Left;  Left Foot 4th and 5th Ray Amputation   HPI:  80 y.o. male admitted to [facility] on 06/10/13 with facial pain and left ear pain.   MRI revealed Scattered small acute infarcts. About 6 lesions are identified in the posterior cerebral and cerebellar hemispheres.  Pt with significant PMHx of COPD, lung CA, HTN, HOH (L ear better, no hearing aids), seizure, L toe and ray amputation (for which he is currently active with [facility] RN for wound care and wears a post-op shoe).      Assessment / Plan / Recommendation Clinical Impression  Pt was easily frustrated and restless throughout limited evaluation, and had difficulty maintaining topic of conversation. He was unable to recall events from earlier in the day, despite cueing. SLP vocied concerns regarding pt going home alone due to difficulty with storage and recall, at which time pt became increasingly verbally agitated. SLP offered memory compensatory strategies, however pt will require acute f/u to attempt reinforcement. Recommend that pt have 24/7 supervision and [place] SLP upon d/c home, although pt declines.    SLP Assessment  Patient needs continued Speech Lanaguage Pathology Services    Follow Up Recommendations  Home health SLP;24 hour supervision/assistance (pt declines at this time)    Frequency and Duration min 1 x/week  1 week   Pertinent Vitals/Pain N/A   SLP Goals  SLP Goals Potential to Achieve Goals: Fair Potential Considerations: Cooperation/participation level  SLP Evaluation Prior Functioning  Cognitive/Linguistic Baseline: Information not available Type of Home: Apartment  Lives With: Alone Available Help at Discharge: Other (Comment) (pt reports "no one, my people are all dead")   Cognition  Overall Cognitive Status: No family/caregiver present to determine baseline cognitive functioning Arousal/Alertness: Awake/alert Orientation Level: Oriented to person;Oriented to place;Oriented to time;Disoriented to situation Attention: Sustained Sustained Attention: Impaired Sustained Attention Impairment: Verbal basic Memory: Impaired Memory Impairment:  Decreased recall of new information (did not recall daily events) Behaviors: Restless;Impulsive;Verbal agitation;Poor frustration tolerance Safety/Judgment: Impaired    Comprehension  Auditory Comprehension Overall Auditory Comprehension: Appears within functional limits for tasks assessed (for basic conversation, pt frustrated and would not let SLP assess further) Visual Recognition/Discrimination Discrimination: Not tested Reading Comprehension Reading Status: Not tested    Expression Expression Primary Mode of Expression: Verbal Verbal Expression Overall Verbal Expression: Appears within functional limits for tasks assessed Written Expression Written Expression: Not tested   Oral / Motor Oral Motor/Sensory Function Overall Oral Motor/Sensory Function: Other (comment) (unable to assess as pt refused, however he did report decreased sensation on the left side of his face) Motor Speech Overall Motor Speech: Appears within functional limits for tasks assessed   GO      Germain Osgood, M.A. CCC-SLP [phone]  Germain Osgood 06/11/2013, 2:16 PM

## 2013-06-11 NOTE — Progress Notes (Signed)
Subjective: Patient is agitated requesting to leave today. He became agitated with sitter in room and was easily redirected by MD at this time. States that he needs to leave the hospital today.  Objective: Vital signs in last 24 hours: Filed Vitals:   06/11/13 0055 06/11/13 0239 06/11/13 0500 06/11/13 0743  BP: 118/54 114/61 126/82 135/66  Pulse: 88 86 88 85  Temp: 98.1 F (36.7 C) 98 F (36.7 C) 97.3 F (36.3 C) 98 F (36.7 C)  TempSrc: Oral Oral Oral Oral  Resp: 18 18 16 16   Height:      Weight:      SpO2: 97% 95% 97% 100%   Weight change:  No intake or output data in the 24 hours ending 06/11/13 1049 Physical Exam  Constitutional: He is oriented to person, place, and time. He appears well-developed and well-nourished. No distress.  Neurological: He is alert and oriented to person, place, and time.  Left sided facial droop.  Skin: He is not diaphoretic.   General: Vital signs reviewed. Patient is a frail elderly male, in no acute distress and cooperative with exam. Hard of hearing.  HEENT: Left lower lid ectropion. Left ear w/ TTP over helix/anti-helix. Scabbed lesion on helix. External auditory canal purple (2/2 ear drops), effusion present behind tympanic membrane. Right ear w/out TTP, however auditory canal tender. Also purple, w/ erythema and questionable effusion. Cerumen present. Patient also w/ left-sided facial droop.  Neck: Supple, trachea midline, normal ROM, No JVD, masses, thyromegaly, or carotid bruit present.  Cardiovascular: RRR, S1 normal, S2 normal, no murmurs, gallops, or rubs.  Pulmonary/Chest: Air entry equal bilaterally, mildly coarse breath sounds throughout, no wheezes or rhonchi.  Abdominal: Soft, non-tender, non-distended, BS +, no masses, organomegaly, or guarding present.  Musculoskeletal: No joint deformities, erythema, or stiffness, ROM full and nontender.  Extremities: No swelling or edema, pulses symmetric and intact bilaterally. No cyanosis or  clubbing. 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 No drainage or erythema. Neurological: A&O x3, Strength is normal and symmetric bilaterally, left facial droop, otherwise cranial nerve II-XII are grossly intact, no focal motor deficit, sensory intact to light touch bilaterally.    Lab Results: Basic Metabolic Panel:  Recent Labs Lab 06/10/13 1235  NA 137  K 3.8  CL 98  GLUCOSE 111*  BUN 9  CREATININE 0.80   CBC:  Recent Labs Lab 06/10/13 1225 06/10/13 1235  WBC 5.8  --   NEUTROABS 4.5  --   HGB 14.4 14.6  HCT 42.3 43.0  MCV 85.8  --   PLT 193  --    Fasting Lipid Panel:  Recent Labs Lab 06/11/13 0810  CHOL 159  HDL 40  LDLCALC 94  TRIG 124  CHOLHDL 4.0   Coagulation:  Recent Labs Lab 06/10/13 1225 06/11/13 0810  LABPROT 13.2 12.8  INR 1.02 0.98   Urine Drug Screen: Drugs of Abuse     Component Value Date/Time   LABOPIA NONE DETECTED 04/01/2013 1858   COCAINSCRNUR NONE DETECTED 04/01/2013 1858   LABBENZ NONE DETECTED 04/01/2013 1858   AMPHETMU NONE DETECTED 04/01/2013 1858   THCU NONE DETECTED 04/01/2013 1858   LABBARB NONE DETECTED 04/01/2013 1858    Studies/Results: Dg Chest 2 View  06/11/2013   CLINICAL DATA:  Stroke  EXAM: CHEST  2 VIEW  COMPARISON:  05/05/2013  FINDINGS: Prior radiation therapy related scarring, right upper lobe. Emphysema. The scarring noted along the bullous lung disease at the left lung  base, with reduced localized airspace opacity in this region compared to the prior exam.  Thoracic spondylosis. Right clavicular deformity related old fracture. The patient is rotated to the right on today's radiograph, reducing diagnostic sensitivity and specificity.  IMPRESSION: 1. Severe emphysema with post radiation therapy related scarring in the right upper lobe. Mild scarring along the left basilar bulla. Currently no acute findings or evidence of aspiration pneumonitis.   Electronically Signed   By: Sherryl Barters M.D.    On: 06/11/2013 07:57   Mr [name] Wo Contrast  06/10/2013   CLINICAL DATA:  80 year old male with new onset left facial droop. Left facial pain with recent year and mastoid pain. Initial encounter. History of lung cancer.  EXAM: MRI HEAD WITHOUT CONTRAST  MRA HEAD WITHOUT CONTRAST  TECHNIQUE: Multiplanar, multiecho pulse sequences of the brain and surrounding structures were obtained without intravenous contrast. Angiographic images of the head were obtained using MRA technique without contrast.  COMPARISON:  Face CT with contrast from the same day. Head CT without contrast 05/24/2013 and earlier.  FINDINGS: MRI HEAD FINDINGS  The examination had to be discontinued prior to completion due to the patient's condition.  Axial in coronal diffusion weighted images demonstrate about 6 small areas of restricted diffusion occurring in the bilateral posterior hemispheres and bilateral cerebellar hemispheres (series 4 and series 5). The largest and most intense of these areas are in the left occipital periventricular white matter (7 mm, series 4, image 16) and left posterior cerebellar hemisphere (6 mm image 7).  No associated mass effect or hemorrhage. Mild associated T2 hyperintensity.  Major intracranial vascular flow voids are preserved except for the distal right vertebral artery, see below.  No midline shift, mass effect, or evidence of intracranial mass lesion. Generalized cerebral volume loss. No ventriculomegaly. No acute or chronic blood products identified. Patchy cerebral white matter T2 and FLAIR hyperintensity, nonspecific. Negative pituitary, cervicomedullary junction and visualized cervical spine.  Visualized orbit soft tissues are within normal limits. Fluid level and mucosal thickening in the right maxillary sinus. Bilateral mastoid fluid. Small volume retained secretions in the nasopharynx. Bilateral seventh nerves at the stylomastoid foramina appear normal. Visualized parotid glands are within normal  limits. Normal bone marrow signal.  MRA HEAD FINDINGS  There is antegrade flow in the distal right vertebral artery, but the signal is diminished compared to that on the left. Antegrade flow in the distal left vertebral artery appears normal. The basilar artery is patent without stenosis. AICA, SCA, and PCA origins are within normal limits. Bilateral PCA branches are within normal limits. Posterior communicating arteries are diminutive or absent.  Antegrade flow in both ICA siphons. Moderate to severe stenosis in the left vertical P each wrist ICA segment (series 703, image 15). Despite this, the left ICA remains patent to the terminus. Ophthalmic artery origins within normal limits. Normal right ICA terminus. MCA and ACA origins are within normal limits. Diminutive anterior communicating artery. Visualized ACA branches are within normal limits. Visualized bilateral MCA branches are within normal limits.  IMPRESSION: 1. Scattered small acute infarcts. About 6 lesions are identified in the posterior cerebral and cerebellar hemispheres. No associated mass effect or hemorrhage. 2. The pattern raises the possibility of a recent embolic event. Synchronous small vessel ischemia it is less likely. 3. Posterior circulation remarkable for poor flow in the distal right vertebral artery. This could be related to the acute findings. 4. Anterior circulation remarkable for high-grade stenosis of the left ICA siphon. 5. No major  circle of Willis branch occlusion identified.   Electronically Signed   By: Lars Pinks M.D.   On: 06/10/2013 16:16   Mr Brain Wo Contrast  06/10/2013   CLINICAL DATA:  80 year old male with new onset left facial droop. Left facial pain with recent year and mastoid pain. Initial encounter. History of lung cancer.  EXAM: MRI HEAD WITHOUT CONTRAST  MRA HEAD WITHOUT CONTRAST  TECHNIQUE: Multiplanar, multiecho pulse sequences of the brain and surrounding structures were obtained without intravenous contrast.  Angiographic images of the head were obtained using MRA technique without contrast.  COMPARISON:  Face CT with contrast from the same day. Head CT without contrast 05/24/2013 and earlier.  FINDINGS: MRI HEAD FINDINGS  The examination had to be discontinued prior to completion due to the patient's condition.  Axial in coronal diffusion weighted images demonstrate about 6 small areas of restricted diffusion occurring in the bilateral posterior hemispheres and bilateral cerebellar hemispheres (series 4 and series 5). The largest and most intense of these areas are in the left occipital periventricular white matter (7 mm, series 4, image 16) and left posterior cerebellar hemisphere (6 mm image 7).  No associated mass effect or hemorrhage. Mild associated T2 hyperintensity.  Major intracranial vascular flow voids are preserved except for the distal right vertebral artery, see below.  No midline shift, mass effect, or evidence of intracranial mass lesion. Generalized cerebral volume loss. No ventriculomegaly. No acute or chronic blood products identified. Patchy cerebral white matter T2 and FLAIR hyperintensity, nonspecific. Negative pituitary, cervicomedullary junction and visualized cervical spine.  Visualized orbit soft tissues are within normal limits. Fluid level and mucosal thickening in the right maxillary sinus. Bilateral mastoid fluid. Small volume retained secretions in the nasopharynx. Bilateral seventh nerves at the stylomastoid foramina appear normal. Visualized parotid glands are within normal limits. Normal bone marrow signal.  MRA HEAD FINDINGS  There is antegrade flow in the distal right vertebral artery, but the signal is diminished compared to that on the left. Antegrade flow in the distal left vertebral artery appears normal. The basilar artery is patent without stenosis. AICA, SCA, and PCA origins are within normal limits. Bilateral PCA branches are within normal limits. Posterior communicating  arteries are diminutive or absent.  Antegrade flow in both ICA siphons. Moderate to severe stenosis in the left vertical P each wrist ICA segment (series 703, image 15). Despite this, the left ICA remains patent to the terminus. Ophthalmic artery origins within normal limits. Normal right ICA terminus. MCA and ACA origins are within normal limits. Diminutive anterior communicating artery. Visualized ACA branches are within normal limits. Visualized bilateral MCA branches are within normal limits.  IMPRESSION: 1. Scattered small acute infarcts. About 6 lesions are identified in the posterior cerebral and cerebellar hemispheres. No associated mass effect or hemorrhage. 2. The pattern raises the possibility of a recent embolic event. Synchronous small vessel ischemia it is less likely. 3. Posterior circulation remarkable for poor flow in the distal right vertebral artery. This could be related to the acute findings. 4. Anterior circulation remarkable for high-grade stenosis of the left ICA siphon. 5. No major circle of Willis branch occlusion identified.   Electronically Signed   By: Lars Pinks M.D.   On: 06/10/2013 16:16   Ct Maxillofacial W/cm  06/10/2013   CLINICAL DATA:  Left-sided mastoid pain and left facial redness.  EXAM: CT MAXILLOFACIAL WITH CONTRAST  TECHNIQUE: Multidetector CT imaging of the maxillofacial structures was performed with intravenous contrast. Multiplanar CT image reconstructions  were also generated. A small metallic BB was placed on the right temple in order to reliably differentiate right from left.  CONTRAST:  111mL OMNIPAQUE IOHEXOL 300 MG/ML  SOLN  COMPARISON:  Head CT 05/25/2010  FINDINGS: There is chronic right maxillary sinus disease with mucoperiosteal thickening, fluid and mildly thickened sinus walls. The left maxillary sinus is clear. The frontal and ethmoid sinuses are clear. Both half of the sphenoid sinus are clear.  There are bilateral mastoid effusions and middle ear disease.  No discrete abscess. The visualized portion of the brain is unremarkable. Chronic atrophy and ventriculomegaly are noted. The globes are intact. No neck mass or adenopathy. The parotid and submandibular glands are unremarkable. There are dense carotid artery calcifications noted bilaterally.  IMPRESSION: Chronic right maxillary sinusitis.  Bilateral mastoid effusions and middle ear disease.  No subcutaneous soft tissue abscess or significant cellulitis.   Electronically Signed   By: Kalman Jewels M.D.   On: 06/10/2013 13:38   Medications: I have reviewed the patient's current medications. Scheduled Meds: . sodium chloride   Intravenous STAT  . amoxicillin-clavulanate  1 tablet Oral Q12H  . aspirin  325 mg Oral Daily  . ciprofloxacin-dexamethasone  4 drop Both Ears BID  . enoxaparin (LOVENOX) injection  40 mg Subcutaneous Q24H  . fluticasone  2 spray Each Nare Daily  . phenytoin  300 mg Oral QHS  . warfarin  7.5 mg Oral ONCE-1800  . Warfarin - Pharmacist Dosing Inpatient   Does not apply q1800   Continuous Infusions: . sodium chloride 125 mL/hr at 06/10/13 1227   PRN Meds:.acetaminophen, senna-docusate Assessment/Plan: Principal Problem:   Acute CVA (cerebrovascular accident) Active Problems:   CVA (cerebral infarction)  Embolic Stroke- Patient w/ recent issues w/ dizziness, lightheadedness, generalized weakness, and left-sided facial droop. Patient's main complaint was left-sided facial pain, however, MRI was performed in the ED, showed several scattered small acute infarcts (~6) in the posterior cerebral and cerebellar hemispheres, raising the possibility of a recent embolic event. MRA also suggests posterior circulation defect w/ poor flow in the distal right vertebral artery. Patient has h/o DVT, on Coumadin at home, compliance however, is in question, given admission INR of 1.02. Echocardiogram demonstrated EF of 55-60% and grade I diastolic dysfunction. Carotid dopplers were 1-39%  stenosis.   The value of a TEE and loop recorder is uncertain in this patient given that he will be indicated for lifelong anticoagulation regardless of the findings of PFO or paroxysmal atrial fibrillation.  -HbA1c (in process), Lipid panel (LDL 94) - neurology consulted will appreciate rec's. -Coumadin per pharmacy  -PT/OT/SLP evaluation  - ASA and statin  Lifelong Anticoagulation History of pulmonary emboli diagnosed incidentally on CT scan of the chest in May 2006. Right lower extremity deep venous thrombosis diagnosed in August 2007 and the patient was off anticoagulation at that time. Patients INR sub therapeutic on arrival. Question of medication non-compliance. - coumadin per pharmacy   Bilateral Ear Pain and Sinusitis- Patient w/ recent h/o sinusitis, treated w/ cephalosporin, but continues to have facial pain and pressure. Also w/ ear pain, now left more than right, recently seen in ED for this, discharged w/ ear drops. Now w/ left-sided effusion seen on exam, tenderness to palpation over helix, therefore likely both otitis media and externa in the left ear. Also w/ erythema and questionable effusion on the right ear. CT maxillofacial shows chronic right maxillary sinusitis w/ bilateral mastoid effusions and middle ear disease, w/ no subcutaneous soft tissue abscess or  significant cellulitis. No leukocytosis on admission.  -Started Augmentin 875-125 po bid  -Ciprodex ear drops; 4 drops in each ear bid  -Tylenol prn for headache  -PT/OT vestibular evaluation    Medication non-compliance The patient is very hard of hearing. Communication is difficult. The patient states that he will be compliant with medications. I am concerned that he has trouble taking his medications as directed. Will need close hospital f/u to prevent readmission.  H/o Non-Small Cell Lung CA- Patient previously w/ non-small cell CA obstructing lesion in the right upper lobe. Received radiation, completed in 2007.  Patient still smokes 2 packs daily. CXR revealed severe emphysema with post radiation therapy related scarring in the right upper lobe. Mild scarring along the left basilar bulla.  HTN- Not on any home medications. Normotensive on admission.  -Allow for permissive HTN   COPD- No active issues. Mild coarse breath sounds on exam w/ chronic cough. Patient claims this is his baseline. No SOB noted.  -Continue to monitor   H/o Seizures- No recent seizures.  -Continue home Dilantin   DVT/PE PPx- Coumadin   Dispo: Anticipate discharge later today pending PT/OT and stroke team.  The patient does have a current PCP Billy Fischer, MD) and does not need an [facility] hospital follow-up appointment after discharge.  The patient does have transportation limitations that hinder transportation to clinic appointments.  .Services Needed at time of discharge: Y = Yes, Blank = No PT:   OT:   RN:   Equipment:   Other:     LOS: 1 day   Marrion Coy, MD 06/11/2013, 10:49 AM

## 2013-06-11 NOTE — Progress Notes (Addendum)
Bilateral carotid artery duplex:  1-39% ICA stenosis.  Right:  vertebral artery flow is retrograde.  Left:  Vertebral artery flow is antegrade.

## 2013-06-11 NOTE — Progress Notes (Signed)
[facility] for Coumadin Indication: stroke, hx PE and DVT  Allergies  Allergen Reactions  . No Known Allergies     Patient Measurements: Height: 5\' 10"  (177.8 cm) Weight: 143 lb 15.4 oz (65.3 kg) IBW/kg (Calculated) : 73  Vital Signs: Temp: 98 F (36.7 C) (05/22 0743) Temp src: Oral (05/22 0743) BP: 135/66 mmHg (05/22 0743) Pulse Rate: 85 (05/22 0743)  Labs:  Recent Labs  06/10/13 1225 06/10/13 1235  HGB 14.4 14.6  HCT 42.3 43.0  PLT 193  --   LABPROT 13.2  --   INR 1.02  --   CREATININE  --  0.80    Estimated Creatinine Clearance: 68 ml/min (by C-G formula based on Cr of 0.8).   Assessment:   Admitted with new stroke.  Coumadin 5 mg daily is among his home meds, but INR only 1.02.  Last dose noted 5/19, though patient could not confirm.   Rx had assisted with Coumadin dosing during March 12-14, 2015 admit.  Admit INR was also subtherapeutic at that time, though he did have an INR of 2.54 on 04/16/13 when here for left 5th toe amputation.   Hx lung cancer in 2005; PE in May 2006 and RLE DVT in 2007.  He has an order for aspirin 325 qday.  MD ordered heparin drip to bridge until INR > 2.  Heparin is contraindicated in acute stroke.  D/w MD: plan is LMWH 40 until INR >=2 and stop aspirin once INR >=2. INR 0.98 today after 7.5 mg of coumadin given last night.   Goal of Therapy:  INR 2-3 Monitor platelets by anticoagulation protocol: Yes   Plan:  repeat Coumadin 7.5 mg tonight. Daily PT/INR. LMWH 40 qday until INR > = 2 Stop aspirin once INR >= 2  Eudelia Bunch, Pharm.D. [phone] 06/11/2013 8:00 AM

## 2013-06-11 NOTE — Progress Notes (Signed)
[facility]  Patient Status: Active (receiving services up to time of hospitalization)  [facility] is providing the following services: RN - wound care visits 3xw ordered by Dr. Sharol Given  If patient discharges after hours, please call [phone].   [facility] 06/11/2013, 10:15 AM

## 2013-06-11 NOTE — Clinical Social Work Note (Signed)
CSW consulted for taxi voucher per RN. Per RN pt has exhausted all other discharge transportation options. CSW provided taxi voucher to RN. RN to confirm pt's home address and call for taxi cab via [facility]. CSW signing off. Thank you.  Pati Gallo, [place] Social Worker [phone]

## 2013-06-15 ENCOUNTER — Ambulatory Visit: Payer: Medicare Other | Admitting: Internal Medicine

## 2013-06-17 NOTE — Discharge Summary (Signed)
Name: Paul [name] MRN: [id number] DOB: 07/29/1930 80 y.o. PCP: Billy Fischer, MD  Date of Admission: 06/10/2013 11:27 AM Date of Discharge: 06/11/2013 Attending Physician: No att. providers found  Discharge Diagnosis:  Principal Problem:   Acute CVA (cerebrovascular accident) Active Problems:   CVA (cerebral infarction)  Discharge Medications:   Medication List    STOP taking these medications       cefdinir 300 MG capsule  Commonly known as:  OMNICEF     NEOMYCIN-POLYMYXIN-HYDROCORTISONE 1 % Soln otic solution  Commonly known as:  CORTISPORIN      TAKE these medications       acetaminophen 325 MG tablet  Commonly known as:  TYLENOL  Take 2 tablets (650 mg total) by mouth every 6 (six) hours as needed for mild pain (or Fever >/= 101).     amoxicillin-clavulanate 875-125 MG per tablet  Commonly known as:  AUGMENTIN  Take 1 tablet by mouth 2 (two) times daily.     aspirin EC 81 MG tablet  Take 1 tablet (81 mg total) by mouth daily.     atorvastatin 40 MG tablet  Commonly known as:  LIPITOR  Take 1 tablet (40 mg total) by mouth daily at 6 PM.     ciprofloxacin-dexamethasone otic suspension  Commonly known as:  CIPRODEX  Place 4 drops into both ears 2 (two) times daily.     FLONASE 50 MCG/ACT nasal spray  Generic drug:  fluticasone  Place 2 sprays into both nostrils daily.     HYDROcodone-acetaminophen 5-325 MG per tablet  Commonly known as:  NORCO  Take 1-2 tablets by mouth every 6 (six) hours as needed for severe pain.     phenytoin 100 MG ER capsule  Commonly known as:  DILANTIN  Take 300 mg by mouth at bedtime.     warfarin 5 MG tablet  Commonly known as:  COUMADIN  Take 1 tablet (5 mg total) by mouth daily at 6 PM.        Disposition and follow-up:   Paul [name] was discharged from [facility] in Stable condition.  At the hospital follow up visit please address:  1.  CVA, Medication compliance, INR, Anticoagulation,  Sinusitis, Otitis media  2.  Labs / imaging needed at time of follow-up: INR, BMP, CBC  3.  Pending labs/ test needing follow-up: None  Follow-up Appointments:     Follow-up Information   Follow up with [facility], MD On 06/15/2013. (9:45 am)    Specialty:  Internal Medicine   Contact information:   [place] [address] [phone]       Follow up with Forbes Cellar, MD. Schedule an appointment as soon as possible for a visit in 2 months.   Specialties:  Neurology, Radiology   Contact information:   [place] [address] [phone]       Discharge Instructions: Discharge Instructions   Call MD for:    Complete by:  As directed   New or worsening symptoms.     Diet - low sodium heart healthy    Complete by:  As directed      Discharge instructions    Complete by:  As directed   You have been diagnosed with a stroke. Please take the coumadin 5 mg each day. Please follow up with the appointments that we made for you.  Also please take the antibiotics that we prescribed for you.  Increase activity slowly    Complete by:  As directed            Consultations:    Procedures Performed:  Dg Chest 2 View  06/11/2013   CLINICAL DATA:  Stroke  EXAM: CHEST  2 VIEW  COMPARISON:  05/05/2013  FINDINGS: Prior radiation therapy related scarring, right upper lobe. Emphysema. The scarring noted along the bullous lung disease at the left lung base, with reduced localized airspace opacity in this region compared to the prior exam.  Thoracic spondylosis. Right clavicular deformity related old fracture. The patient is rotated to the right on today's radiograph, reducing diagnostic sensitivity and specificity.  IMPRESSION: 1. Severe emphysema with post radiation therapy related scarring in the right upper lobe. Mild scarring along the left basilar bulla. Currently no acute findings or evidence of aspiration pneumonitis.    Electronically Signed   By: Sherryl Barters M.D.   On: 06/11/2013 07:57   Ct Head Wo Contrast  05/24/2013   CLINICAL DATA:  Generalized weakness for 3 weeks. Intermittent headaches.  EXAM: CT HEAD WITHOUT CONTRAST  TECHNIQUE: Contiguous axial images were obtained from the base of the skull through the vertex without intravenous contrast.  COMPARISON:  CT C SPINE W/O CM dated 04/01/2013; CT HEAD W/O CM dated 07/13/2011  FINDINGS: No mass lesion, mass effect, midline shift, hydrocephalus, hemorrhage. No acute territorial cortical ischemia/infarct. Atrophy and chronic ischemic white matter disease is present. Frothy secretions are present within the right maxillary sinus which layer dependently. This can be associated with acute sinusitis. The appearance is little changed compared to prior exam. Calvarium appears intact. Bilateral mastoid fluid noted. . Left maxillary sinus disease appears resolved. Intracranial atherosclerosis.  IMPRESSION: 1. Atrophy and chronic ischemic white matter disease without acute intracranial abnormality. 2. Right maxillary sinus disease with frothy secretions. Fluid level can be associated with acute sinusitis in the appropriate clinical setting.   Electronically Signed   By: Dereck Ligas M.D.   On: 05/24/2013 15:51   Mr Paul [name] Wo Contrast  06/10/2013   CLINICAL DATA:  80 year old male with new onset left facial droop. Left facial pain with recent year and mastoid pain. Initial encounter. History of lung cancer.  EXAM: MRI HEAD WITHOUT CONTRAST  MRA HEAD WITHOUT CONTRAST  TECHNIQUE: Multiplanar, multiecho pulse sequences of the brain and surrounding structures were obtained without intravenous contrast. Angiographic images of the head were obtained using MRA technique without contrast.  COMPARISON:  Face CT with contrast from the same day. Head CT without contrast 05/24/2013 and earlier.  FINDINGS: MRI HEAD FINDINGS  The examination had to be discontinued prior to completion due to  the patient's condition.  Axial in coronal diffusion weighted images demonstrate about 6 small areas of restricted diffusion occurring in the bilateral posterior hemispheres and bilateral cerebellar hemispheres (series 4 and series 5). The largest and most intense of these areas are in the left occipital periventricular white matter (7 mm, series 4, image 16) and left posterior cerebellar hemisphere (6 mm image 7).  No associated mass effect or hemorrhage. Mild associated T2 hyperintensity.  Major intracranial vascular flow voids are preserved except for the distal right vertebral artery, see below.  No midline shift, mass effect, or evidence of intracranial mass lesion. Generalized cerebral volume loss. No ventriculomegaly. No acute or chronic blood products identified. Patchy cerebral white matter T2 and FLAIR hyperintensity, nonspecific. Negative pituitary, cervicomedullary junction and visualized cervical spine.  Visualized orbit soft tissues are within normal limits. Fluid level  and mucosal thickening in the right maxillary sinus. Bilateral mastoid fluid. Small volume retained secretions in the nasopharynx. Bilateral seventh nerves at the stylomastoid foramina appear normal. Visualized parotid glands are within normal limits. Normal bone marrow signal.  MRA HEAD FINDINGS  There is antegrade flow in the distal right vertebral artery, but the signal is diminished compared to that on the left. Antegrade flow in the distal left vertebral artery appears normal. The basilar artery is patent without stenosis. AICA, SCA, and PCA origins are within normal limits. Bilateral PCA branches are within normal limits. Posterior communicating arteries are diminutive or absent.  Antegrade flow in both ICA siphons. Moderate to severe stenosis in the left vertical P each wrist ICA segment (series 703, image 15). Despite this, the left ICA remains patent to the terminus. Ophthalmic artery origins within normal limits. Normal right  ICA terminus. MCA and ACA origins are within normal limits. Diminutive anterior communicating artery. Visualized ACA branches are within normal limits. Visualized bilateral MCA branches are within normal limits.  IMPRESSION: 1. Scattered small acute infarcts. About 6 lesions are identified in the posterior cerebral and cerebellar hemispheres. No associated mass effect or hemorrhage. 2. The pattern raises the possibility of a recent embolic event. Synchronous small vessel ischemia it is less likely. 3. Posterior circulation remarkable for poor flow in the distal right vertebral artery. This could be related to the acute findings. 4. Anterior circulation remarkable for high-grade stenosis of the left ICA siphon. 5. No major circle of Willis branch occlusion identified.   Electronically Signed   By: Lars Pinks M.D.   On: 06/10/2013 16:16   Mr Brain Wo Contrast  06/10/2013   CLINICAL DATA:  80 year old male with new onset left facial droop. Left facial pain with recent year and mastoid pain. Initial encounter. History of lung cancer.  EXAM: MRI HEAD WITHOUT CONTRAST  MRA HEAD WITHOUT CONTRAST  TECHNIQUE: Multiplanar, multiecho pulse sequences of the brain and surrounding structures were obtained without intravenous contrast. Angiographic images of the head were obtained using MRA technique without contrast.  COMPARISON:  Face CT with contrast from the same day. Head CT without contrast 05/24/2013 and earlier.  FINDINGS: MRI HEAD FINDINGS  The examination had to be discontinued prior to completion due to the patient's condition.  Axial in coronal diffusion weighted images demonstrate about 6 small areas of restricted diffusion occurring in the bilateral posterior hemispheres and bilateral cerebellar hemispheres (series 4 and series 5). The largest and most intense of these areas are in the left occipital periventricular white matter (7 mm, series 4, image 16) and left posterior cerebellar hemisphere (6 mm image 7).  No  associated mass effect or hemorrhage. Mild associated T2 hyperintensity.  Major intracranial vascular flow voids are preserved except for the distal right vertebral artery, see below.  No midline shift, mass effect, or evidence of intracranial mass lesion. Generalized cerebral volume loss. No ventriculomegaly. No acute or chronic blood products identified. Patchy cerebral white matter T2 and FLAIR hyperintensity, nonspecific. Negative pituitary, cervicomedullary junction and visualized cervical spine.  Visualized orbit soft tissues are within normal limits. Fluid level and mucosal thickening in the right maxillary sinus. Bilateral mastoid fluid. Small volume retained secretions in the nasopharynx. Bilateral seventh nerves at the stylomastoid foramina appear normal. Visualized parotid glands are within normal limits. Normal bone marrow signal.  MRA HEAD FINDINGS  There is antegrade flow in the distal right vertebral artery, but the signal is diminished compared to that on the left. Antegrade  flow in the distal left vertebral artery appears normal. The basilar artery is patent without stenosis. AICA, SCA, and PCA origins are within normal limits. Bilateral PCA branches are within normal limits. Posterior communicating arteries are diminutive or absent.  Antegrade flow in both ICA siphons. Moderate to severe stenosis in the left vertical P each wrist ICA segment (series 703, image 15). Despite this, the left ICA remains patent to the terminus. Ophthalmic artery origins within normal limits. Normal right ICA terminus. MCA and ACA origins are within normal limits. Diminutive anterior communicating artery. Visualized ACA branches are within normal limits. Visualized bilateral MCA branches are within normal limits.  IMPRESSION: 1. Scattered small acute infarcts. About 6 lesions are identified in the posterior cerebral and cerebellar hemispheres. No associated mass effect or hemorrhage. 2. The pattern raises the possibility  of a recent embolic event. Synchronous small vessel ischemia it is less likely. 3. Posterior circulation remarkable for poor flow in the distal right vertebral artery. This could be related to the acute findings. 4. Anterior circulation remarkable for high-grade stenosis of the left ICA siphon. 5. No major circle of Willis branch occlusion identified.   Electronically Signed   By: Lars Pinks M.D.   On: 06/10/2013 16:16   Ct Maxillofacial W/cm  06/10/2013   CLINICAL DATA:  Left-sided mastoid pain and left facial redness.  EXAM: CT MAXILLOFACIAL WITH CONTRAST  TECHNIQUE: Multidetector CT imaging of the maxillofacial structures was performed with intravenous contrast. Multiplanar CT image reconstructions were also generated. A small metallic BB was placed on the right temple in order to reliably differentiate right from left.  CONTRAST:  184mL OMNIPAQUE IOHEXOL 300 MG/ML  SOLN  COMPARISON:  Head CT 05/25/2010  FINDINGS: There is chronic right maxillary sinus disease with mucoperiosteal thickening, fluid and mildly thickened sinus walls. The left maxillary sinus is clear. The frontal and ethmoid sinuses are clear. Both half of the sphenoid sinus are clear.  There are bilateral mastoid effusions and middle ear disease. No discrete abscess. The visualized portion of the brain is unremarkable. Chronic atrophy and ventriculomegaly are noted. The globes are intact. No neck mass or adenopathy. The parotid and submandibular glands are unremarkable. There are dense carotid artery calcifications noted bilaterally.  IMPRESSION: Chronic right maxillary sinusitis.  Bilateral mastoid effusions and middle ear disease.  No subcutaneous soft tissue abscess or significant cellulitis.   Electronically Signed   By: Kalman Jewels M.D.   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 Surgical site is erythematous and painful.  EXAM: LEFT FOOT -  COMPLETE 3+ VIEW  COMPARISON:  DG FOOT COMPLETE*L* dated 04/01/2013  FINDINGS: Interval amputation of the fourth and fifth rays, at the proximal metatarsal shafts. There is cortical regularity at the surgical margin, especially along the residual fourth metatarsal.  IMPRESSION: 1. Partial amputations of the fourth and fifth rays with erosive changes along the surgical margin of the fourth metatarsal, worrisome for osteomyelitis. 2. Slight irregularity of the fifth metatarsal surgical margin, also worrisome for early osteomyelitis.   Electronically Signed   By: Lorin Picket M.D.   On: 05/24/2013 15:13    2D Echo: - Left ventricle: The cavity size was normal. Wall thickness was normal. Systolic function was normal. The estimated ejection fraction was in the range of 55% to 60%. Wall motion was normal; there were no regional wall motion abnormalities. Doppler parameters are consistent with  abnormal left ventricular relaxation (grade 1 diastolic dysfunction). - Mitral valve: Mild prolapse, involving the anterior leaflet. There was mild regurgitation.  Admission HPI: [name] is a 80 y.o. male w/ PMHx of HTN, COPD, GERD, h/o Lung CA, and h/o seizures, presented to the [facility] ED w/ complaints of left facial pain and left ear pain. The patient is very hard of hearing and a poor historian, however, he claims the facial pain has been present for over a week now, located over the left side of his face, described as a pressure sensation. Mr. Tucker was seen in the ED recently for sinusitis, discharged on Cefdinir, but continues to have pain, congestion, and headache. The patient has also had recent issues w/ otalgia for which he was seen in the ED on 06/03/13 for right ear pain. Today, he complains more of left ear ache, tender to the touch over the helix of the ear. He has been using Cortisporine for this, but still has pain in both ears. The patient also describes recent symptoms of dizziness, vision  changes, and right-sided facial weakness, all of which he claims have been present for 3-4 weeks. He denies any significant weakness in his extremities, but does say he has been more fatigued recently. He denies fever, chills, nausea, vomiting, abdominal pain, diarrhea, chest pain, SOB, or palpitations. 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  On arrival to the ED, patient received maxillofacial CT which showed RIGHT-sided maxillary sinusitis, bilateral mastoid effusions, and middle ear disease. MRI was also performed which showed several scattered small acute infarcts in the posterior cerebral and cerebellar hemispheres.   Hospital Course by problem list: Principal Problem:   Acute CVA (cerebrovascular accident) Active Problems:   CVA (cerebral infarction)   Embolic Stroke-  Patient presented w/ complains of dizziness, lightheadedness, generalized weakness, and left-sided facial droop. MRI revealed acute stroke concerning for embolic event.Patient on chronic coumadin with an INR of 1.02 (compliance was questioned). Echocardiogram demonstrated EF of 55-60% and grade I diastolic dysfunction. Carotid dopplers were 1-39% stenosis. The patient was discharge on ASA, statin, and home coumadin.  Lifelong Anticoagulation  History of pulmonary emboli diagnosed incidentally on CT scan of the chest in May 2006. Right lower extremity deep venous thrombosis diagnosed in August 2007 and the patient was off anticoagulation at that time. Patients INR sub therapeutic on arrival. Question of medication non-compliance. Patient was discharged on home coumadin regimen which will require outpatient management and likely titration.  Otitis media and Sinusitis-  The patient presented with face and ear pain. Discharged on Augmentin 875-125 po bid and Ciprodex ear drops; 4 drops in each ear bid. Further the patient was encouraged to use tylenol prn for  headache.  Medication non-compliance  The patient is very hard of hearing. Communication is difficult. The patient states that he will be compliant with medications. I am concerned that he has trouble taking his medications as directed. Will need close hospital f/u to prevent readmission.   H/o Non-Small Cell Lung CA- Stable on admission. Will suggest this be followed as outpatient.  HTN- Not on any home medications. Normotensive on admission.  COPD- Stable on admission.  H/o Seizures- Stable on admission. Continueed home Dilantin on discharge.   Discharge Vitals:   BP 155/85  Pulse 80  Temp(Src) 98.6 F (37 C) (Oral)  Resp 18  Ht 5\' 10"  (1.778 m)  Wt 143 lb 15.4 oz (  65.3 kg)  BMI 20.66 kg/m2  SpO2 100%  Discharge Labs:  No results found for this or any previous visit (from the past 24 hour(s)).  Signed: Marrion Coy, MD 06/17/2013, 10:24 AM   Time Spent on Discharge: 25 minutes Services Ordered on Discharge: None Equipment Ordered on Discharge: None   Attending physician note: I personally interviewed and examined this patient on the day of discharge. Problem list, physical findings, and management plan accurate as recorded above by resident physician Dr. Marrion Coy.  Murriel Hopper, MD, [place]  Hematology-Oncology/Internal Medicine

## 2013-06-18 ENCOUNTER — Emergency Department (HOSPITAL_COMMUNITY)
Admission: EM | Admit: 2013-06-18 | Discharge: 2013-06-18 | Disposition: A | Discharge status: Home / Self Care | Payer: Medicare Other | Attending: Emergency Medicine | Admitting: Emergency Medicine

## 2013-06-18 ENCOUNTER — Encounter (HOSPITAL_COMMUNITY): Payer: Self-pay | Admitting: Emergency Medicine

## 2013-06-18 ENCOUNTER — Emergency Department (HOSPITAL_COMMUNITY): Payer: Medicare Other

## 2013-06-18 DIAGNOSIS — Z7901 Long term (current) use of anticoagulants: Secondary | ICD-10-CM | POA: Insufficient documentation

## 2013-06-18 DIAGNOSIS — J438 Other emphysema: Secondary | ICD-10-CM | POA: Insufficient documentation

## 2013-06-18 DIAGNOSIS — Z792 Long term (current) use of antibiotics: Secondary | ICD-10-CM | POA: Insufficient documentation

## 2013-06-18 DIAGNOSIS — Z85118 Personal history of other malignant neoplasm of bronchus and lung: Secondary | ICD-10-CM | POA: Insufficient documentation

## 2013-06-18 DIAGNOSIS — I639 Cerebral infarction, unspecified: Secondary | ICD-10-CM

## 2013-06-18 DIAGNOSIS — Z8673 Personal history of transient ischemic attack (TIA), and cerebral infarction without residual deficits: Secondary | ICD-10-CM | POA: Insufficient documentation

## 2013-06-18 DIAGNOSIS — R791 Abnormal coagulation profile: Secondary | ICD-10-CM

## 2013-06-18 DIAGNOSIS — Z79899 Other long term (current) drug therapy: Secondary | ICD-10-CM | POA: Insufficient documentation

## 2013-06-18 DIAGNOSIS — Z8669 Personal history of other diseases of the nervous system and sense organs: Secondary | ICD-10-CM | POA: Insufficient documentation

## 2013-06-18 DIAGNOSIS — I6350 Cerebral infarction due to unspecified occlusion or stenosis of unspecified cerebral artery: Secondary | ICD-10-CM

## 2013-06-18 DIAGNOSIS — F17200 Nicotine dependence, unspecified, uncomplicated: Secondary | ICD-10-CM | POA: Insufficient documentation

## 2013-06-18 DIAGNOSIS — Z8719 Personal history of other diseases of the digestive system: Secondary | ICD-10-CM | POA: Insufficient documentation

## 2013-06-18 DIAGNOSIS — Z7982 Long term (current) use of aspirin: Secondary | ICD-10-CM | POA: Insufficient documentation

## 2013-06-18 DIAGNOSIS — Z8701 Personal history of pneumonia (recurrent): Secondary | ICD-10-CM | POA: Insufficient documentation

## 2013-06-18 DIAGNOSIS — G40909 Epilepsy, unspecified, not intractable, without status epilepticus: Secondary | ICD-10-CM | POA: Insufficient documentation

## 2013-06-18 DIAGNOSIS — I10 Essential (primary) hypertension: Secondary | ICD-10-CM | POA: Insufficient documentation

## 2013-06-18 DIAGNOSIS — IMO0002 Reserved for concepts with insufficient information to code with codable children: Secondary | ICD-10-CM | POA: Insufficient documentation

## 2013-06-18 DIAGNOSIS — G500 Trigeminal neuralgia: Secondary | ICD-10-CM

## 2013-06-18 LAB — CBC
HCT: 47.2 % (ref 39.0–52.0)
Hemoglobin: 15.9 g/dL (ref 13.0–17.0)
MCH: 29.8 pg (ref 26.0–34.0)
MCHC: 33.7 g/dL (ref 30.0–36.0)
MCV: 88.6 fL (ref 78.0–100.0)
PLATELETS: 229 10*3/uL (ref 150–400)
RBC: 5.33 MIL/uL (ref 4.22–5.81)
RDW: 14.4 % (ref 11.5–15.5)
WBC: 7 10*3/uL (ref 4.0–10.5)

## 2013-06-18 LAB — BASIC METABOLIC PANEL
BUN: 9 mg/dL (ref 6–23)
CALCIUM: 9.3 mg/dL (ref 8.4–10.5)
CO2: 29 meq/L (ref 19–32)
CREATININE: 0.7 mg/dL (ref 0.50–1.35)
Chloride: 96 mEq/L (ref 96–112)
GFR calc Af Amer: >90 mL/min (ref >90)
GFR, EST NON AFRICAN AMERICAN: 87 mL/min — AB (ref >90)
Glucose, Bld: 133 mg/dL — ABNORMAL HIGH (ref 70–99)
Potassium: 4 mEq/L (ref 3.7–5.3)
SODIUM: 135 meq/L — AB (ref 137–147)

## 2013-06-18 LAB — PROTIME-INR
INR: 5.42 (ref 0.00–1.49)
PROTHROMBIN TIME: 46.7 s — AB (ref 11.6–15.2)

## 2013-06-18 LAB — PHENYTOIN LEVEL, TOTAL: PHENYTOIN LVL: 11.3 ug/mL (ref 10.0–20.0)

## 2013-06-18 MED ORDER — WARFARIN SODIUM 5 MG PO TABS: 2.5000 mg | ORAL_TABLET | Freq: Every day | ORAL | Status: DC

## 2013-06-18 MED ORDER — BACLOFEN 10 MG PO TABS: 5.0000 mg | ORAL_TABLET | Freq: Three times a day (TID) | ORAL | Status: DC

## 2013-06-18 MED ORDER — OXYCODONE-ACETAMINOPHEN 5-325 MG PO TABS
2.0000 | ORAL_TABLET | Freq: Once | ORAL | Status: AC
Start: 2013-06-18 — End: 2013-06-18
  Administered 2013-06-18: 2 via ORAL
  Filled 2013-06-18: qty 2

## 2013-06-18 MED ORDER — LAMOTRIGINE 25 MG PO TABS: 25.0000 mg | ORAL_TABLET | Freq: Every day | ORAL | Status: DC

## 2013-06-18 NOTE — ED Provider Notes (Signed)
CSN: [id number]     Arrival date & time 06/18/13  7510 History   First MD Initiated Contact with Patient 06/18/13 1044     Chief Complaint  Patient presents with  . Headache  . Numbness    Patient is very hard of hearing.  (Consider location/radiation/quality/duration/timing/severity/associated sxs/prior Treatment) Patient is a 80 y.o. male presenting with headaches.  Headache  80 y.o. 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  He has been having sharp stabbing pain to left side of head since then.  This occurs intermittently up to every 1.5 hours and last for a short time.  He has been taking norco for this without relief.  He has not noted any change in his weakness.  He has had ongoing weakness of left side of face since stroke and has not noted any change in weakness, rash, or vision change.  He is taking medication as prescribed on discharge which includes anticoagulation.   Past Medical History  Diagnosis Date  . Hypertension   . COPD (chronic obstructive pulmonary disease)   . Hemorrhoids     external  . 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 GERD (gastroesophageal reflux disease)   . Emphysema   . HOH (hard of hearing)   . Non-small cell carcinoma of lung dx'd 2006    stage 3  . Lung cancer   . Pneumonia   . Seizures    Past Surgical History  Procedure Laterality Date  . Inner ear surgery    . Toe amputation Left   . Tonsillectomy    . Amputation Left 04/16/2013    Procedure: AMPUTATION Abed Schar;  Surgeon: Newt Minion, MD;  Location: [facility];  Service: Orthopedics;  Laterality: Left;  Left Foot 4th and 5th [place] Amputation   No family history on file. History  Substance Use Topics  . Smoking status: Current Every Day Smoker -- 1.50 packs/day for 60 years    Types: Cigarettes  . Smokeless tobacco: Never Used  . Alcohol Use: No    Review of Systems  Neurological: Positive for headaches.  All other systems reviewed and are  negative.     Allergies  No known allergies  Home Medications   Prior to Admission medications   Medication Sig Start Date End Date Taking? Authorizing Provider  acetaminophen (TYLENOL) 325 MG tablet Take 2 tablets (650 mg total) by mouth every 6 (six) hours as needed for mild pain (or Fever >/= 101). 04/03/13   Clinton Gallant, MD  amoxicillin-clavulanate (AUGMENTIN) 875-125 MG per tablet Take 1 tablet by mouth 2 (two) times daily. 06/11/13   Marrion Coy, MD  aspirin EC 81 MG tablet Take 1 tablet (81 mg total) by mouth daily. 06/11/13   Marrion Coy, MD  atorvastatin (LIPITOR) 40 MG tablet Take 1 tablet (40 mg total) by mouth daily at 6 PM. 06/11/13   Marrion Coy, MD  ciprofloxacin-dexamethasone [facility]) otic suspension Place 4 drops into both ears 2 (two) times daily. 06/11/13   Marrion Coy, MD  fluticasone (FLONASE) 50 MCG/ACT nasal spray Place 2 sprays into both nostrils daily.    Historical Provider, MD  HYDROcodone-acetaminophen (NORCO) 5-325 MG per tablet Take 1-2 tablets by mouth every 6 (six) hours as needed for severe pain. 06/03/13   Shari A Upstill, PA-C  phenytoin (DILANTIN) 100 MG ER capsule Take 300 mg by mouth at bedtime.     Historical Provider, MD  warfarin (COUMADIN) 5 MG tablet  Take 1 tablet (5 mg total) by mouth daily at 6 PM. 06/11/13   Marrion Coy, MD   BP 148/76  Pulse 76  Temp(Src) 98 F (36.7 C) (Oral)  Resp 16  SpO2 99% Physical Exam  Nursing note and vitals reviewed. Constitutional: He is oriented to person, place, and time. He appears well-developed and well-nourished.  HENT:  Head: Normocephalic and atraumatic.  Right Ear: External ear normal.  Left Ear: External ear normal.  Nose: Nose normal.  Mouth/Throat: Oropharynx is clear and moist.  Eyes: Conjunctivae and EOM are normal. Pupils are equal, round, and reactive to light.  Neck: Normal range of motion. Neck supple.  Cardiovascular: Normal rate, regular rhythm,  normal heart sounds and intact distal pulses.   Pulmonary/Chest:  Prolonged expiration and decreased breaths sounds  Abdominal: Soft. Bowel sounds are normal.  Musculoskeletal: Normal range of motion. He exhibits no edema and no tenderness.  Neurological: He is alert and oriented to person, place, and time. He has normal reflexes. He displays normal reflexes. He exhibits normal muscle tone. Coordination normal.  Left side facial droop  Skin: Skin is warm and dry.  Psychiatric: He has a normal mood and affect. His behavior is normal.    ED Course  Procedures (including critical care time) Labs Review Labs Reviewed  BASIC METABOLIC PANEL - Abnormal; Notable for the following:    Sodium 135 (*)    Glucose, Bld 133 (*)    GFR calc non Af Amer 87 (*)    All other components within normal limits  PROTIME-INR - Abnormal; Notable for the following:    Prothrombin Time 46.7 (*)    INR 5.42 (*)    All other components within normal limits  CBC  PHENYTOIN LEVEL, TOTAL    Imaging Review Ct Head Wo Contrast  06/18/2013   CLINICAL DATA:  Recent infarct  EXAM: CT HEAD WITHOUT CONTRAST  TECHNIQUE: Contiguous axial images were obtained from the base of the skull through the vertex without intravenous contrast.  COMPARISON:  06/10/2013  FINDINGS: The bony calvarium is intact. Mucosal thickening is noted in the right maxillary antrum stable from the prior exam. Atrophic changes and chronic white matter ischemic change is again identified. No acute hemorrhage, acute infarction or space-occupying mass lesion is identified. The previously seen punctate embolic it areas of ischemia are not well appreciated on this exam.  IMPRESSION: Chronic changes without acute abnormality.   Electronically Signed   By: Inez Catalina M.D.   On: 06/18/2013 13:02     EKG Interpretation None      MDM   Final diagnoses:  Trigeminal neuralgia  Elevated INR   No evidence of bleed as cause of pain.  Patient is on  coumading 5 mg /day with inr elevated at 5.  Plan hold coumadin today and tomorrow and restart on 2.5 mg and recheck next week.  Patient advised regarding s/s bleeding.  Pain appears consistent with trigeminal neuralgia as they are recurring in paroxysma episodes, severe intensity, electric shock like shooting pain, occurs in division of trigeminal nerve.  There is neuro deficity of left face weakness but he has had a recently diagnosed cva.  He is on phenytoin and will have baclofen added.    Shaune Pollack, MD 06/21/13 [phone]

## 2013-06-18 NOTE — ED Notes (Signed)
INR 5.42 CRITICAL LAB notified Dr. Jeanell Sparrow

## 2013-06-18 NOTE — ED Notes (Signed)
MD at bedside. 

## 2013-06-18 NOTE — Discharge Instructions (Signed)
Begin baclofen for pain.  Hold coumadin (do not take) today or tomorrow, then begin at new, lower dose of 2.5 mg.  Have your inr (how thin your blood is) checked mid week next week.   Trigeminal Neuralgia Trigeminal neuralgia is a nerve disorder that causes sudden attacks of severe facial pain. It is caused by damage to the trigeminal nerve, a major nerve in the face. It is more common in women and in the elderly, although it can also happen in younger patients. Attacks last from a few seconds to several minutes and can occur from a couple of times per year to several times per day. Trigeminal neuralgia can be a very distressing and disabling condition. Surgery may be needed in very severe cases if medical treatment does not give relief. HOME CARE INSTRUCTIONS   If your caregiver prescribed medication to help prevent attacks, take as directed.  To help prevent attacks:  Chew on the unaffected side of the mouth.  Avoid touching your face.  Avoid blasts of hot or cold air.  Men may wish to grow a beard to avoid having to shave. SEEK IMMEDIATE MEDICAL CARE IF:  Pain is unbearable and your medicine does not help.  You develop new, unexplained symptoms (problems).  You have problems that may be related to a medication you are taking. Document Released: 01/05/2000 Document Revised: 04/01/2011 Document Reviewed: 11/04/2008 [facility] Patient Information 2014 [place], [place].  Warfarin Coagulopathy Warfarin (Coumadin) coagulopathy refers to bleeding that may occur as a complication of the medicine warfarin. Warfarin is an oral blood thinner (anticoagulant). Warfarin is used for medical conditions where thinning of the blood is needed to prevent blood clots.  CAUSES Bleeding is the most common and most serious complication of warfarin. The amount of bleeding is related to the warfarin dose and length of treatment. In addition, bleeding complications can also occur due to:  Intentional or  accidental warfarin overdose.  Underlying medical conditions.  Dietary changes.  Medicine, herbal, supplement, or alcohol interactions. SYMPTOMS Severe bleeding while on warfarin may occur from any tissue or organ. Symptoms of the blood being too thin may include:  Bleeding from the nose or gums.  Blood in bowel movements which may appear as bright red, dark, or black tarry stools.  Blood in the urine which may appear as pink, red, or brown urine.  Unusual bruising or bruising easily.  A cut that does not stop bleeding within 10 minutes.  Vomiting blood or continuous nausea for more than 1 day.  Coughing up blood.  Broken blood vessels in your eye (subconjunctival hemorrhage).  Abdominal or back pain with or without flank bruising.  Sudden, severe headache.  Sudden weakness or numbness of the face, arm, or leg, especially on one side of the body.  Sudden confusion.  Trouble speaking (aphasia) or understanding.  Sudden trouble seeing in one or both eyes.  Sudden trouble walking.  Dizziness.  Loss of balance or coordination.  Vaginal bleeding.  Swelling or pain at an injection site.  Superficial fat tissue death (necrosis) which may cause skin scarring. This is more common in women and may first present as pain in the waist, thighs, and buttocks.  Fever. HOME CARE INSTRUCTIONS  Always contact your caregiver of any concerns or signs of possible warfarin coagulopathy as soon as possible.  Take warfarin exactly as directed by your caregiver. It is recommended that you take your warfarin dose at the same time of the day. It is preferred that you take  warfarin in the late afternoon. If you have been told to stop taking warfarin, do not resume taking warfarin until directed to do so by your caregiver. Follow your caregiver's instructions if you accidentally take an extra dose or miss a dose of warfarin. It is very important to take warfarin as directed since bleeding or  blood clots could result in chronic or permanent injury, pain, or disability.  Keep all follow-up appointments with your caregiver as directed. It is very important to keep your appointments. Not keeping appointments could result in a chronic or permanent injury, pain, or disability because warfarin is a medicine that requires close monitoring.  While taking warfarin, you will need to have regular blood tests to measure your blood clotting time. These blood tests usually include both the prothrombin time (PT) and International Normalized Ratio (INR) tests. The PT and INR results allow your caregiver to adjust your dose of warfarin. The dose can change for many reasons. It is critically important that you have your PT and INR levels drawn exactly as directed. PT and INR lab draws are usually done in the morning. Your warfarin dose may stay the same or change depending on what the PT and INR results are. Be sure to follow up with your caregiver regarding your PT and INR test results and what your warfarin dosage should be.  Many medicines can interfere with warfarin and affect the PT and INR results. You must tell your caregiver about any and all medicines you take, this includes all vitamins and supplements. Ask your caregiver before taking these. Prescription and over-the-counter medicine consistency is critical to warfarin management. It is important that potential interactions are checked before you start a new medicine. Be especially cautious with aspirin and anti-inflammatory medicines. Ask your caregiver before taking these. Medicines such as antibiotics and acid-reducing medicine can interact with warfarin and can cause an increased warfarin effect. Warfarin can also interfere with the effectiveness of medicines you are taking. Do not take or discontinue any prescribed or over-the-counter medicine except on the advice of your caregiver or pharmacist.  Some vitamins, supplements, and herbal products  interfere with the effectiveness of warfarin. Vitamin E may increase the anticoagulant effects of warfarin. Vitamin K may can cause warfarin to be less effective. Do not take or discontinue any vitamin, supplement, or herbal product except on the advice of your caregiver or pharmacist.  Some foods, especially foods high in vitamin K can interfere with the effectiveness of warfarin and affect the PT and INR results. A diet too high in vitamin K can cause warfarin to be less effective. A diet too low in foods containing vitamin K may lead to an excessive warfarin effect. Foods high in vitamin K include spinach, kale, broccoli, cabbage, collard and turnip greens, brussels sprouts, peas, cauliflower, seaweed, and parsley as well as beef and pork liver, green tea, and soybean oil. Eat what you normally eat and keep the vitamin K content of your diet consistent. Avoid major changes in your diet, or notify your caregiver before changing your diet. Arrange a visit with a dietitian to answer your questions.  If you have a loss of appetite or get the stomach flu (viral gastroenteritis), talk to your caregiver as soon as possible. A decrease in your normal vitamin K intake can make you more sensitive to your usual dose of warfarin.  Some medical conditions may increase your risk for bleeding while you are taking warfarin. A fever, diarrhea lasting more than a day,  worsening heart failure, or worsening liver function are some medical conditions that could affect warfarin. Contact your caregiver if you have any of these medical conditions.  Be careful not to cut yourself when using sharp objects or while shaving.  Alcohol can change the body's ability to handle warfarin. It is best to avoid alcoholic drinks or consume only very small amounts while taking warfarin. Notify your caregiver if you change your alcohol intake. A sudden increase in alcohol use can increase your risk of bleeding. Chronic alcohol use can cause  warfarin to be less effective.  Limit physical activities or sports that could result in a fall or cause injury.  Do not use warfarin if you are pregnant.  Inform all your caregivers and your dentist that you take warfarin.  Inform all caregivers if you are taking warfarin and aspirin or platelet inhibitor medicines such as clopidogrel, ticagrelor, or prasugrel. Use of these medicines in conjunction with warfarin can increase your risk of bleeding or death. Taking these medicines together should only be done under the direct care of your caregiver. SEEK IMMEDIATE MEDICAL CARE IF:  You cough up blood.  You have dark or black stools or there is bright red blood coming from your rectum.  You vomit blood or have nausea for more than 1 day.  You have blood in the urine or pink colored urine.  You have unusual bruising or have increased bruising.  You have bleeding from the nose or gums that does not stop quickly.  You have a cut that does not stop bleeding within a 2 3 minutes.  You have sudden weakness or numbness of the face, arm, or leg, especially on one side of the body.  You have sudden confusion.  You have trouble speaking (aphasia) or understanding.  You have sudden trouble seeing in one or both eyes.  You have sudden trouble walking.  You have dizziness.  You have a loss of balance or coordination.  You have a sudden, severe headache.  You have a serious fall or head injury, even if you are not bleeding.  You have swelling or pain at an injection site.  You have unexplained tenderness or pain in the abdomen, back, waist, thighs or buttocks.  You have a fever. Any of these symptoms may represent a serious problem that is an emergency. Do not wait to see if the symptoms will go away. Get medical help right away. Call your local emergency services (911 in [place] Do not drive yourself to the hospital. Document Released: 12/16/2005 Document Revised: 07/09/2011 Document  Reviewed: 06/18/2011 [facility] Patient Information 2014 [place].

## 2013-06-18 NOTE — ED Notes (Addendum)
Pt reports was diagnosed with stroke last week at [facility]. States continuing to have sharp shooting pains in head, lasts 10-15 seconds and numbness to left side of face since last week. Pt has facial droop to left side, no arm drift, left hand weaker than right. No slurred speech. Unsure what is baseline from a week ago. Pt very hard of hearing, no family with pt.

## 2013-06-22 ENCOUNTER — Encounter (HOSPITAL_BASED_OUTPATIENT_CLINIC_OR_DEPARTMENT_OTHER): Payer: Medicare Other | Attending: General Surgery

## 2013-06-25 ENCOUNTER — Ambulatory Visit (HOSPITAL_COMMUNITY): Admission: RE | Admit: 2013-06-25 | Payer: Medicare Other | Source: Ambulatory Visit

## 2013-06-25 ENCOUNTER — Other Ambulatory Visit: Payer: Medicare Other

## 2013-06-28 ENCOUNTER — Ambulatory Visit: Payer: Medicare Other | Admitting: Internal Medicine

## 2013-06-30 ENCOUNTER — Encounter: Payer: Self-pay | Admitting: Neurology

## 2013-06-30 ENCOUNTER — Ambulatory Visit (INDEPENDENT_AMBULATORY_CARE_PROVIDER_SITE_OTHER): Payer: Medicare Other | Admitting: Neurology

## 2013-06-30 VITALS — BP 102/68 | HR 66 | Temp 97.9°F | Resp 16 | Ht 70.0 in | Wt 131.6 lb

## 2013-06-30 DIAGNOSIS — R569 Unspecified convulsions: Secondary | ICD-10-CM

## 2013-06-30 DIAGNOSIS — I639 Cerebral infarction, unspecified: Secondary | ICD-10-CM

## 2013-06-30 DIAGNOSIS — G44099 Other trigeminal autonomic cephalgias (TAC), not intractable: Secondary | ICD-10-CM

## 2013-06-30 DIAGNOSIS — I6350 Cerebral infarction due to unspecified occlusion or stenosis of unspecified cerebral artery: Secondary | ICD-10-CM

## 2013-06-30 NOTE — Patient Instructions (Signed)
1.  We will start lamotrigine (Lamictal).  We will have you pick up a starter kit and take as directed.  Call when there is 3 days left and we can place another prescription 2.  Continue Dilantin ER 310m at bedtime for now. 3.  Follow up in 8 weeks.

## 2013-06-30 NOTE — Progress Notes (Addendum)
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  Records and images personally reviewed.  He developed symptoms about two months ago.  He describes severe throbbing pain beginning on this for head and radiating down the left side of his face and neck and into the left shoulder and upper arm. It also involves the left ear and notes aural fullness.  It lasts about 15 seconds and occurs approximately every 2 hours. There is associated left facial numbness and left facial swelling. It is also associated with left conjunctival injection and rhinorrhea. It could be triggered by getting irritated. He is able to eat without any problems. Light touch and eating does not trigger the episodes. He doesn't brush his teeth as he has no teeth.  He denies visual changes.  He denies nausea.  He went to the ED on 05/24/13.  Maxillofacial CT revealed right-sided maxillary sinusitis, bilateral mastoid effusions and middle ear disease.  He was treated for sinusitis but the pain got worse.  He saw ENT who agreed with this diagnosis.  He presented to the ED on 06/10/13.  MRI of the brain without contrast revealed small acute infarcts in the bilateral posterior hemispheres and bilateral cerebellar hemispheres.  He was admitted for stroke workup.  MRA of the head revealed poor flow in the distal right vertebral artery.  High grade stenosis noted involving the left ICA siphon.  2D echo revealed LVEF 55-60^  and grade I diastolic dysfunction.  Carotid dopplers showed 1-39% stenosis in both carotid arteries.  EKG showed normal sinus rhythm.  He takes Coumadin for history of pulmonary emboli and DVT.  He was non-compliant and INR was 1.02.  He was discharged on ASA, statin and home Coumadin.  For otitis media and sinusitis, he was discharged on Augmentin and Ciprodex ear drops.  He has been prescribed Norco for the pain.  He had overdosed at one point.  He already takes Dilantin ER 300mg  at bedtime for remote history of seizures.  He cannot tell me more details about this and he does not remember the last time he had a seizure.    06/18/13 LABS:  Na 135, K 4.0, BUN 9, Cr 0.70, WBC 7, Hgb 15.9, HCT 47.2, PLT 229, phenytoin level 11.3, INR 5.42.    PAST MEDICAL HISTORY: Past Medical History  Diagnosis Date  . Hypertension   . COPD (chronic obstructive pulmonary disease)   . Hemorrhoids     external  . 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 GERD (gastroesophageal reflux disease)   . Emphysema   . HOH (hard of hearing)   . Non-small cell carcinoma of lung dx'd 2006    stage 3  . Lung cancer   . Pneumonia   . Seizures   . Headache(784.0)     PAST SURGICAL HISTORY: Past Surgical History  Procedure Laterality Date  . Inner ear surgery    . Toe amputation Left   . Tonsillectomy    . Amputation Left  04/16/2013    Procedure: AMPUTATION RAY;  Surgeon: Newt Minion, MD;  Location: [place];  Service: Orthopedics;  Laterality: Left;  Left Foot 4th and 5th Ray Amputation    MEDICATIONS: Current Outpatient Prescriptions on File Prior to Visit  Medication Sig Dispense Refill  . acetaminophen (TYLENOL) 325 MG tablet Take 2 tablets (650 mg total) by mouth every 6 (six) hours as needed for mild pain (or Fever >/= 101).  30 tablet  0  . ciprofloxacin-dexamethasone (CIPRODEX) otic suspension Place 4 drops into both ears 2 (two) times daily.  7.5 mL  0  . fluticasone (FLONASE) 50 MCG/ACT  nasal spray Place 2 sprays into both nostrils daily.      . phenytoin (DILANTIN) 100 MG ER capsule Take 300 mg by mouth at bedtime.       Marland Kitchen warfarin (COUMADIN) 5 MG tablet Take 0.5 tablets (2.5 mg total) by mouth daily at 6 PM.  30 tablet  0  . amoxicillin-clavulanate (AUGMENTIN) 875-125 MG per tablet Take 1 tablet by mouth 2 (two) times daily.  14 tablet  0  . aspirin EC 81 MG tablet Take 1 tablet (81 mg total) by mouth daily.  30 tablet  0  . atorvastatin (LIPITOR) 40 MG tablet Take 1 tablet (40 mg total) by mouth daily at 6 PM.  30 tablet  0  . HYDROcodone-acetaminophen (NORCO) 5-325 MG per tablet Take 1-2 tablets by mouth every 6 (six) hours as needed for severe pain.  15 tablet  0  . lamoTRIgine (LAMICTAL) 25 MG tablet Take 1 tablet (25 mg total) by mouth daily.  14 tablet  0   No current facility-administered medications on file prior to visit.    ALLERGIES: Allergies  Allergen Reactions  . No Known Allergies     FAMILY HISTORY: Family History  Problem Relation Age of Onset  . Heart failure Brother     SOCIAL HISTORY: History   Social History  . Marital Status: Divorced    Spouse Name: N/A    Number of Children: N/A  . Years of Education: N/A   Occupational History  . Not on file.   Social History Main Topics  . Smoking status: Current Every Day Smoker -- 1.50 packs/day for 60 years    Types: Cigarettes  . Smokeless tobacco: Never Used  . Alcohol Use: No  . Drug Use: No  . Sexual Activity: No   Other Topics Concern  . Not on file   Social History Narrative  . No narrative on file    REVIEW OF SYSTEMS: Constitutional: No fevers, chills, or sweats, no generalized fatigue, change in appetite Eyes: No visual changes, double vision, eye pain Ear, nose and throat: hearing loss, ear pain, nasal congestion Cardiovascular: No chest pain, palpitations Respiratory:  No shortness of breath at rest or with exertion, wheezes GastrointestinaI: No nausea, vomiting,  diarrhea, abdominal pain, fecal incontinence Genitourinary:  No dysuria, urinary retention or frequency Musculoskeletal:  Transient neck pain Integumentary: No rash, pruritus, skin lesions Neurological: as above Psychiatric: No depression, insomnia, anxiety Endocrine: No palpitations, fatigue, diaphoresis, mood swings, change in appetite, change in weight, increased thirst Hematologic/Lymphatic:  No anemia, purpura, petechiae. Allergic/Immunologic: no itchy/runny eyes, nasal congestion, recent allergic reactions, rashes  PHYSICAL EXAM: Filed Vitals:   06/30/13 1258  BP: 102/68  Pulse: 66  Temp: 97.9 F (36.6 C)  Resp: 16   General: No acute distress Head:  Normocephalic/atraumatic Neck: supple, no paraspinal tenderness, full range of motion Back: No  paraspinal tenderness Heart: regular rate and rhythm Lungs: Clear to auscultation bilaterally. Vascular: No carotid bruits. Neurological Exam: Mental status: alert and oriented to person, place, and time, recent and remote memory intact, fund of knowledge intact, attention and concentration intact, speech fluent and not dysarthric, language intact. Cranial nerves: CN I: not tested CN II: pupils equal, round and reactive to light, visual fields intact, fundi unremarkable, without vessel changes, exudates, hemorrhages or papilledema. CN III, IV, VI:  full range of motion, no nystagmus, no ptosis CN V: facial sensation intact CN VII: upper and lower face symmetric CN VIII: significantly hard of hearing CN IX, X: gag intact, uvula midline CN XI: sternocleidomastoid and trapezius muscles intact CN XII: tongue midline Bulk & Tone: normal, no fasciculations. Motor: 5/5 throughout Sensation: Reduced pinprick sensation in the hands and feet. Reduced vibration sensation in the feet. Deep Tendon Reflexes: 2+ throughout except absent in the ankles. Toes downgoing Finger to nose testing: No dysmetria Gait: Normal station and stride. Romberg  negative.  IMPRESSION: Atypical trigeminal autonomic cephalgia.  Not classic trigeminal neuralgia.  With autonomic symptoms, consider SUNCT Recent embolic stroke, likely secondary to hypercoagulability History of seizures  PLAN: 1.  Will start and titrate lamotrigine ER, starting at 50mg  daily and titrating to 100mg  daily.  At that point, he will contact us and we can further titrate dose up.  His neighbor will administer the medication to him. 2.  Continue Dilantin ER 300mg  at bedtime for now.  Once he is on a therapeutic dose of lamotrigine, consider tapering off Dilantin. 3.  Continue anticoagulation 4.  Smoking cessation 5.  Check sed rate. 6.  Follow up in 8 weeks  Thank you for allowing me to take part in the care of this patient.  Metta Clines, DO  CC:  Ihor Gully, MD

## 2013-07-13 ENCOUNTER — Telehealth: Payer: Self-pay | Admitting: *Deleted

## 2013-07-13 NOTE — Telephone Encounter (Signed)
I left a message for contact person Ms Lacinda Axon that patient has a lab slip at the front desk of our office he needs to take downstairs  At [facility] lab . I have tried several times to contact him with no luck

## 2013-08-02 ENCOUNTER — Telehealth: Payer: Self-pay | Admitting: *Deleted

## 2013-08-02 NOTE — Telephone Encounter (Signed)
Niece  of pt insist pt comes to [facility] - unable to find notes office visit. [facility] was admitting group for notes in [employer]. PCP listed is Dr Juventino Slovak.  Refuses to come to [facility]. Has had pain outer ear past week and going down to neck area. Tried to explain pt needs to be seen before any referrals are done.  Suggest to call Dr Julien Nordmann.  Phone call goes in and out. No contact on phone line. Anderson Malta from ENT office calls and states we are listed PCP on insurance card. Susette Racer talked with Anderson Malta and decided to do an over ride. Hilda Blades Yaiden Yang RN 08/02/13 10:45AM

## 2013-08-09 ENCOUNTER — Telehealth: Payer: Self-pay | Admitting: Medical Oncology

## 2013-08-09 NOTE — Telephone Encounter (Signed)
I faxed pt records to Dr Constance Holster with note to contact PCP for clearance as pt was a no show at last appt.

## 2013-08-09 NOTE — Telephone Encounter (Signed)
Is it okay for Dr Constance Holster to do facial surgery for left facial skin cancer?

## 2013-09-03 ENCOUNTER — Encounter (HOSPITAL_BASED_OUTPATIENT_CLINIC_OR_DEPARTMENT_OTHER): Payer: Self-pay | Admitting: *Deleted

## 2013-09-03 NOTE — Progress Notes (Signed)
Pt has no phone-his niece takes care of him-copd-pvd still smokes-needs pt ptt-she will bring him in 8/18 for labs and instructions

## 2013-09-07 ENCOUNTER — Encounter (HOSPITAL_COMMUNITY): Payer: Self-pay | Admitting: *Deleted

## 2013-09-07 ENCOUNTER — Ambulatory Visit: Payer: Medicare Other | Admitting: Neurology

## 2013-09-08 ENCOUNTER — Ambulatory Visit (HOSPITAL_COMMUNITY)
Admission: RE | Admit: 2013-09-08 | Discharge: 2013-09-08 | Disposition: A | Discharge status: Home / Self Care | Payer: Medicare Other | Source: Ambulatory Visit | Attending: Otolaryngology | Admitting: Otolaryngology

## 2013-09-08 ENCOUNTER — Ambulatory Visit (HOSPITAL_COMMUNITY): Payer: Medicare Other | Admitting: Certified Registered Nurse Anesthetist

## 2013-09-08 ENCOUNTER — Encounter (HOSPITAL_COMMUNITY): Payer: Medicare Other | Admitting: Certified Registered Nurse Anesthetist

## 2013-09-08 ENCOUNTER — Encounter (HOSPITAL_BASED_OUTPATIENT_CLINIC_OR_DEPARTMENT_OTHER): Admission: RE | Payer: Self-pay | Source: Ambulatory Visit

## 2013-09-08 ENCOUNTER — Encounter (HOSPITAL_COMMUNITY): Payer: Self-pay | Admitting: *Deleted

## 2013-09-08 ENCOUNTER — Ambulatory Visit (HOSPITAL_BASED_OUTPATIENT_CLINIC_OR_DEPARTMENT_OTHER): Admission: RE | Admit: 2013-09-08 | Payer: Medicare Other | Source: Ambulatory Visit | Admitting: Otolaryngology

## 2013-09-08 ENCOUNTER — Encounter (HOSPITAL_COMMUNITY)
Admission: RE | Disposition: A | Discharge status: Home / Self Care | Payer: Self-pay | Source: Ambulatory Visit | Attending: Otolaryngology

## 2013-09-08 DIAGNOSIS — Z85118 Personal history of other malignant neoplasm of bronchus and lung: Secondary | ICD-10-CM | POA: Insufficient documentation

## 2013-09-08 DIAGNOSIS — Z8701 Personal history of pneumonia (recurrent): Secondary | ICD-10-CM | POA: Diagnosis not present

## 2013-09-08 DIAGNOSIS — Z8679 Personal history of other diseases of the circulatory system: Secondary | ICD-10-CM | POA: Insufficient documentation

## 2013-09-08 DIAGNOSIS — Z7901 Long term (current) use of anticoagulants: Secondary | ICD-10-CM | POA: Insufficient documentation

## 2013-09-08 DIAGNOSIS — I10 Essential (primary) hypertension: Secondary | ICD-10-CM | POA: Diagnosis not present

## 2013-09-08 DIAGNOSIS — R569 Unspecified convulsions: Secondary | ICD-10-CM | POA: Insufficient documentation

## 2013-09-08 DIAGNOSIS — F17200 Nicotine dependence, unspecified, uncomplicated: Secondary | ICD-10-CM | POA: Diagnosis not present

## 2013-09-08 DIAGNOSIS — J449 Chronic obstructive pulmonary disease, unspecified: Secondary | ICD-10-CM | POA: Insufficient documentation

## 2013-09-08 DIAGNOSIS — C44221 Squamous cell carcinoma of skin of unspecified ear and external auricular canal: Secondary | ICD-10-CM | POA: Insufficient documentation

## 2013-09-08 DIAGNOSIS — C44209 Unspecified malignant neoplasm of skin of left ear and external auricular canal: Secondary | ICD-10-CM

## 2013-09-08 DIAGNOSIS — K219 Gastro-esophageal reflux disease without esophagitis: Secondary | ICD-10-CM | POA: Insufficient documentation

## 2013-09-08 DIAGNOSIS — Z8673 Personal history of transient ischemic attack (TIA), and cerebral infarction without residual deficits: Secondary | ICD-10-CM | POA: Insufficient documentation

## 2013-09-08 DIAGNOSIS — J4489 Other specified chronic obstructive pulmonary disease: Secondary | ICD-10-CM | POA: Insufficient documentation

## 2013-09-08 DIAGNOSIS — Z79899 Other long term (current) drug therapy: Secondary | ICD-10-CM | POA: Diagnosis not present

## 2013-09-08 HISTORY — DX: Cerebral infarction, unspecified: I63.9

## 2013-09-08 HISTORY — PX: LIPOMA EXCISION: SHX5283

## 2013-09-08 LAB — CBC
HCT: 41.1 % (ref 39.0–52.0)
HEMOGLOBIN: 13.8 g/dL (ref 13.0–17.0)
MCH: 29.7 pg (ref 26.0–34.0)
MCHC: 33.6 g/dL (ref 30.0–36.0)
MCV: 88.4 fL (ref 78.0–100.0)
Platelets: 220 10*3/uL (ref 150–400)
RBC: 4.65 MIL/uL (ref 4.22–5.81)
RDW: 13.9 % (ref 11.5–15.5)
WBC: 5.7 10*3/uL (ref 4.0–10.5)

## 2013-09-08 LAB — BASIC METABOLIC PANEL
Anion gap: 13 (ref 5–15)
BUN: 9 mg/dL (ref 6–23)
CHLORIDE: 100 meq/L (ref 96–112)
CO2: 24 mEq/L (ref 19–32)
CREATININE: 0.69 mg/dL (ref 0.50–1.35)
Calcium: 9.1 mg/dL (ref 8.4–10.5)
GFR calc non Af Amer: 87 mL/min — ABNORMAL LOW (ref >90)
GLUCOSE: 108 mg/dL — AB (ref 70–99)
POTASSIUM: 4.3 meq/L (ref 3.7–5.3)
Sodium: 137 mEq/L (ref 137–147)

## 2013-09-08 LAB — PROTIME-INR
INR: 1.05 (ref 0.00–1.49)
Prothrombin Time: 13.7 seconds (ref 11.6–15.2)

## 2013-09-08 SURGERY — EXCISION MASS
Anesthesia: Monitor Anesthesia Care | Site: Face | Laterality: Left

## 2013-09-08 SURGERY — EXCISION LIPOMA
Anesthesia: Monitor Anesthesia Care | Site: Face | Laterality: Left

## 2013-09-08 MED ORDER — MIDAZOLAM HCL 5 MG/5ML IJ SOLN
INTRAMUSCULAR | Status: DC | PRN
  Administered 2013-09-08: 1 mg via INTRAVENOUS

## 2013-09-08 MED ORDER — PHENYLEPHRINE HCL 10 MG/ML IJ SOLN
INTRAMUSCULAR | Status: DC | PRN
  Administered 2013-09-08: 40 ug via INTRAVENOUS
  Administered 2013-09-08: 80 ug via INTRAVENOUS
  Administered 2013-09-08 (×2): 40 ug via INTRAVENOUS

## 2013-09-08 MED ORDER — ONDANSETRON HCL 4 MG/2ML IJ SOLN
INTRAMUSCULAR | Status: AC
  Filled 2013-09-08: qty 2

## 2013-09-08 MED ORDER — ALBUTEROL SULFATE (2.5 MG/3ML) 0.083% IN NEBU
INHALATION_SOLUTION | RESPIRATORY_TRACT | Status: AC
  Administered 2013-09-08: 2.5 mg via ORAL
  Filled 2013-09-08: qty 3

## 2013-09-08 MED ORDER — BACITRACIN ZINC 500 UNIT/GM EX OINT
TOPICAL_OINTMENT | CUTANEOUS | Status: DC | PRN
  Administered 2013-09-08: 1 via TOPICAL

## 2013-09-08 MED ORDER — CEFAZOLIN SODIUM-DEXTROSE 2-3 GM-% IV SOLR
INTRAVENOUS | Status: DC | PRN
  Administered 2013-09-08: 2 g via INTRAVENOUS

## 2013-09-08 MED ORDER — PROMETHAZINE HCL 25 MG RE SUPP: 25.0000 mg | Freq: Four times a day (QID) | RECTAL | Status: DC | PRN

## 2013-09-08 MED ORDER — DEXAMETHASONE SODIUM PHOSPHATE 4 MG/ML IJ SOLN
INTRAMUSCULAR | Status: DC | PRN
  Administered 2013-09-08: 4 mg via INTRAVENOUS

## 2013-09-08 MED ORDER — BACITRACIN ZINC 500 UNIT/GM EX OINT
TOPICAL_OINTMENT | CUTANEOUS | Status: AC
  Filled 2013-09-08: qty 15

## 2013-09-08 MED ORDER — 0.9 % SODIUM CHLORIDE (POUR BTL) OPTIME
TOPICAL | Status: DC | PRN
  Administered 2013-09-08: 1000 mL

## 2013-09-08 MED ORDER — CEPHALEXIN 500 MG PO CAPS: 500.0000 mg | ORAL_CAPSULE | Freq: Three times a day (TID) | ORAL | Status: DC

## 2013-09-08 MED ORDER — DEXAMETHASONE SODIUM PHOSPHATE 4 MG/ML IJ SOLN
INTRAMUSCULAR | Status: AC
  Filled 2013-09-08: qty 1

## 2013-09-08 MED ORDER — PHENYLEPHRINE 40 MCG/ML (10ML) SYRINGE FOR IV PUSH (FOR BLOOD PRESSURE SUPPORT)
PREFILLED_SYRINGE | INTRAVENOUS | Status: AC
  Filled 2013-09-08: qty 10

## 2013-09-08 MED ORDER — LACTATED RINGERS IV SOLN
INTRAVENOUS | Status: DC
  Administered 2013-09-08: 13:00:00 via INTRAVENOUS

## 2013-09-08 MED ORDER — MIDAZOLAM HCL 2 MG/2ML IJ SOLN
INTRAMUSCULAR | Status: AC
  Filled 2013-09-08: qty 2

## 2013-09-08 MED ORDER — LIDOCAINE-EPINEPHRINE 1 %-1:100000 IJ SOLN
INTRAMUSCULAR | Status: AC
  Filled 2013-09-08: qty 1

## 2013-09-08 MED ORDER — ONDANSETRON HCL 4 MG/2ML IJ SOLN
INTRAMUSCULAR | Status: DC | PRN
  Administered 2013-09-08: 4 mg via INTRAVENOUS

## 2013-09-08 MED ORDER — CEFAZOLIN SODIUM-DEXTROSE 2-3 GM-% IV SOLR
INTRAVENOUS | Status: AC
  Filled 2013-09-08: qty 50

## 2013-09-08 MED ORDER — HYDROCODONE-ACETAMINOPHEN 7.5-325 MG PO TABS: 1.0000 | ORAL_TABLET | Freq: Four times a day (QID) | ORAL | Status: DC | PRN

## 2013-09-08 MED ORDER — LIDOCAINE-EPINEPHRINE 1 %-1:100000 IJ SOLN
INTRAMUSCULAR | Status: DC | PRN
  Administered 2013-09-08: 3 mL

## 2013-09-08 MED ORDER — LIDOCAINE HCL (PF) 1 % IJ SOLN
INTRAMUSCULAR | Status: AC
  Filled 2013-09-08: qty 30

## 2013-09-08 MED ORDER — LIDOCAINE-EPINEPHRINE (PF) 1 %-1:200000 IJ SOLN
INTRAMUSCULAR | Status: AC
  Filled 2013-09-08: qty 10

## 2013-09-08 MED ORDER — FENTANYL CITRATE 0.05 MG/ML IJ SOLN
INTRAMUSCULAR | Status: AC
  Filled 2013-09-08: qty 5

## 2013-09-08 MED ORDER — PROPOFOL INFUSION 10 MG/ML OPTIME
INTRAVENOUS | Status: DC | PRN
  Administered 2013-09-08: 50 ug/kg/min via INTRAVENOUS
  Administered 2013-09-08: 16:00:00 via INTRAVENOUS

## 2013-09-08 MED ORDER — FENTANYL CITRATE 0.05 MG/ML IJ SOLN
INTRAMUSCULAR | Status: DC | PRN
Start: 2013-09-08 — End: 2013-09-08
  Administered 2013-09-08: 25 ug via INTRAVENOUS
  Administered 2013-09-08: 50 ug via INTRAVENOUS

## 2013-09-08 MED ORDER — ROCURONIUM BROMIDE 50 MG/5ML IV SOLN
INTRAVENOUS | Status: AC
  Filled 2013-09-08: qty 1

## 2013-09-08 SURGICAL SUPPLY — 42 items
AIRSTRIP 4 3/4X3 1/4 7185 (GAUZE/BANDAGES/DRESSINGS) IMPLANT
ATTRACTOMAT 16X20 MAGNETIC DRP (DRAPES) IMPLANT
BNDG CONFORM 2 STRL LF (GAUZE/BANDAGES/DRESSINGS) IMPLANT
BNDG GAUZE ELAST 4 BULKY (GAUZE/BANDAGES/DRESSINGS) IMPLANT
CANISTER SUCTION 2500CC (MISCELLANEOUS) IMPLANT
CATH ROBINSON RED A/P 16FR (CATHETERS) IMPLANT
CLEANER TIP ELECTROSURG 2X2 (MISCELLANEOUS) ×3 IMPLANT
CONT SPEC 4OZ CLIKSEAL STRL BL (MISCELLANEOUS) ×6 IMPLANT
COTTON STERILE ROLL (GAUZE/BANDAGES/DRESSINGS) ×3 IMPLANT
COVER SURGICAL LIGHT HANDLE (MISCELLANEOUS) ×3 IMPLANT
DRAIN PENROSE 1/4X12 LTX STRL (WOUND CARE) IMPLANT
DRSG EMULSION OIL 3X3 NADH (GAUZE/BANDAGES/DRESSINGS) IMPLANT
ELECT COATED BLADE 2.86 ST (ELECTRODE) ×3 IMPLANT
ELECT NEEDLE TIP 2.8 STRL (NEEDLE) IMPLANT
ELECT REM PT RETURN 9FT ADLT (ELECTROSURGICAL) ×3
ELECTRODE REM PT RTRN 9FT ADLT (ELECTROSURGICAL) ×1 IMPLANT
GAUZE SPONGE 4X4 12PLY STRL (GAUZE/BANDAGES/DRESSINGS) IMPLANT
GAUZE SPONGE 4X4 16PLY XRAY LF (GAUZE/BANDAGES/DRESSINGS) IMPLANT
GLOVE ECLIPSE 7.5 STRL STRAW (GLOVE) ×3 IMPLANT
GOWN STRL REUS W/ TWL LRG LVL3 (GOWN DISPOSABLE) ×2 IMPLANT
GOWN STRL REUS W/TWL LRG LVL3 (GOWN DISPOSABLE) ×4
KIT BASIN OR (CUSTOM PROCEDURE TRAY) ×3 IMPLANT
KIT ROOM TURNOVER OR (KITS) ×3 IMPLANT
NEEDLE 25GX 5/8IN NON SAFETY (NEEDLE) ×3 IMPLANT
NS IRRIG 1000ML POUR BTL (IV SOLUTION) ×3 IMPLANT
PAD ARMBOARD 7.5X6 YLW CONV (MISCELLANEOUS) ×6 IMPLANT
PENCIL FOOT CONTROL (ELECTRODE) ×3 IMPLANT
POUCH STERILIZING 3 X22 (STERILIZATION PRODUCTS) IMPLANT
SUT CHROMIC 4 0 P 3 18 (SUTURE) ×6 IMPLANT
SUT ETHILON 4 0 PS 2 18 (SUTURE) ×3 IMPLANT
SUT ETHILON 5 0 P 3 18 (SUTURE) ×2
SUT NYLON ETHILON 5-0 P-3 1X18 (SUTURE) ×1 IMPLANT
SUT SILK 4 0 REEL (SUTURE) ×3 IMPLANT
SWAB COLLECTION DEVICE MRSA (MISCELLANEOUS) IMPLANT
SYR BULB IRRIGATION 50ML (SYRINGE) IMPLANT
SYR TB 1ML LUER SLIP (SYRINGE) IMPLANT
TOWEL OR 17X24 6PK STRL BLUE (TOWEL DISPOSABLE) ×3 IMPLANT
TOWEL OR 17X26 10 PK STRL BLUE (TOWEL DISPOSABLE) ×3 IMPLANT
TRAY ENT MC OR (CUSTOM PROCEDURE TRAY) ×3 IMPLANT
TUBE ANAEROBIC SPECIMEN COL (MISCELLANEOUS) IMPLANT
WATER STERILE IRR 1000ML POUR (IV SOLUTION) ×3 IMPLANT
YANKAUER SUCT BULB TIP NO VENT (SUCTIONS) IMPLANT

## 2013-09-08 NOTE — Anesthesia Preprocedure Evaluation (Addendum)
Anesthesia Evaluation  Patient identified by MRN, date of birth, ID band Patient awake    Reviewed: Allergy & Precautions, H&P , NPO status , Patient's Chart, lab work & pertinent test results  History of Anesthesia Complications Negative for: history of anesthetic complications  Airway Mallampati: I TM Distance: >3 FB Neck ROM: Full    Dental  (+) Dental Advisory Given, Edentulous Upper, Edentulous Lower   Pulmonary COPDCurrent Smoker,    + wheezing      Cardiovascular hypertension, + Peripheral Vascular Disease     Neuro/Psych Seizures -, Well Controlled,  CVA negative psych ROS   GI/Hepatic Neg liver ROS, GERD-  ,  Endo/Other  negative endocrine ROS  Renal/GU negative Renal ROS     Musculoskeletal   Abdominal   Peds  Hematology   Anesthesia Other Findings   Reproductive/Obstetrics                          Anesthesia Physical Anesthesia Plan  ASA: III  Anesthesia Plan: MAC   Post-op Pain Management:    Induction: Intravenous  Airway Management Planned: Simple Face Mask  Additional Equipment:   Intra-op Plan:   Post-operative Plan:   Informed Consent: I have reviewed the patients History and Physical, chart, labs and discussed the procedure including the risks, benefits and alternatives for the proposed anesthesia with the patient or authorized representative who has indicated his/her understanding and acceptance.   Dental advisory given  Plan Discussed with: CRNA, Anesthesiologist and Surgeon  Anesthesia Plan Comments:         Anesthesia Quick Evaluation

## 2013-09-08 NOTE — Op Note (Signed)
OPERATIVE REPORT  DATE OF SURGERY: 09/08/2013  PATIENT:  Paul [name],  80 y.o. male  PRE-OPERATIVE DIAGNOSIS:  LEFT  EAR SKIN CANCER   POST-OPERATIVE DIAGNOSIS:  LEFT  EAR SKIN CANCER   PROCEDURE:  Procedure(s): EXCISION OF LEFT PINNA SKIN CANCER WITH FROZEN SECTION   SURGEON:  Beckie Salts, MD  ASSISTANTS: none  ANESTHESIA:   Local anesthetic with intravenous sedation and monitored anesthesia care  EBL:  5 ml  DRAINS: None   LOCAL MEDICATIONS USED:  1% Xylocaine with epinephrine  SPECIMEN:  Left partial pinnectomy, frozen section evaluation, squamous cell carcinoma, negative margins.  COUNTS:  Correct  PROCEDURE DETAILS: The patient was taken to the operating room and placed on the operating table in the supine position. Following induction of intravenous sedation the left ear was prepped and draped in a standard fashion. A greater regular nerve block was accomplished with Xylocaine with epinephrine solution. Proposed incision were marked with a marking pen around the lesion was a wedge shaped defect, and additional local anesthetic was applied. Small piece of the central part of the skin lesion on the anterior surface of the upper scaphoid was sent for frozen section analysis and was positive for squamous cell carcinoma. A wedge resection was then performed. 15 scalpel was used for the entire procedure. The specimen was  oriented with sutures and sent for frozen section analysis. The defect was reapproximated in 2 layers using interrupted 4-0 chromic sutures. Cosmetic result was adequate. There is no further bleeding. Topical bacitracin and a cottonball was applied inside the pinna. Patient was transferred to PACU in stable condition.    PATIENT DISPOSITION:  To PACU, stable

## 2013-09-08 NOTE — Anesthesia Procedure Notes (Signed)
Procedure Name: MAC Date/Time: 09/08/2013 2:45 PM Performed by: Carola Frost Pre-anesthesia Checklist: Patient identified, Timeout performed, Emergency Drugs available, Suction available and Patient being monitored Patient Re-evaluated:Patient Re-evaluated prior to inductionOxygen Delivery Method: Nasal cannula Preoxygenation: Pre-oxygenation with 100% oxygen Intubation Type: IV induction Placement Confirmation: positive ETCO2 and breath sounds checked- equal and bilateral Dental Injury: Teeth and Oropharynx as per pre-operative assessment

## 2013-09-08 NOTE — H&P (Signed)
Paul [name] is an 80 y.o. male.   Chief Complaint: Left ear skin cancer HPI: Long history of crusted lesion left auricle  Past Medical History  Diagnosis Date  . Hypertension   . COPD (chronic obstructive pulmonary disease)   . Hemorrhoids     external  . 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 GERD (gastroesophageal reflux disease)   . Emphysema   . HOH (hard of hearing)   . Pneumonia   . Seizures   . Headache(784.0)   . Non-small cell carcinoma of lung dx'd 2006    stage 3  . Lung cancer   . Stroke     Past Surgical History  Procedure Laterality Date  . Inner ear surgery    . Toe amputation Left   . Tonsillectomy    . Amputation Left 04/16/2013    Procedure: AMPUTATION RAY;  Surgeon: Newt Minion, MD;  Location: [place];  Service: Orthopedics;  Laterality: Left;  Left Foot 4th and 5th Ray Amputation    Family History  Problem Relation Age of Onset  . Heart failure Brother    Social History:  reports that he has been smoking Cigarettes.  He has a 90 pack-year smoking history. He has never used smokeless tobacco. He reports that he does not drink alcohol or use illicit drugs.  Allergies:  Allergies  Allergen Reactions  . No Known Allergies     Medications Prior to Admission  Medication Sig Dispense Refill  . HYDROcodone-acetaminophen (NORCO) 7.5-325 MG per tablet Take 1 tablet by mouth every 6 (six) hours as needed for moderate pain.      . phenytoin (DILANTIN) 100 MG ER capsule Take 300 mg by mouth daily.      Marland Kitchen warfarin (COUMADIN) 5 MG tablet Take 5 mg by mouth daily.        Results for orders placed during the hospital encounter of 09/08/13 (from the past 48 hour(s))  PROTIME-INR     Status: None   Collection Time    09/08/13 12:49 PM      Result Value Ref Range   Prothrombin Time 13.7  11.6 - 15.2 seconds   INR 1.05  0.00 - 1.22  BASIC METABOLIC PANEL     Status: Abnormal   Collection Time    09/08/13 12:49 PM      Result Value Ref Range    Sodium 137  137 - 147 mEq/L   Potassium 4.3  3.7 - 5.3 mEq/L   Chloride 100  96 - 112 mEq/L   CO2 24  19 - 32 mEq/L   Glucose, Bld 108 (*) 70 - 99 mg/dL   BUN 9  6 - 23 mg/dL   Creatinine, Ser 0.69  0.50 - 1.35 mg/dL   Calcium 9.1  8.4 - 10.5 mg/dL   GFR calc non Af Amer 87 (*) >90 mL/min   GFR calc Af Amer >90  >90 mL/min   Comment: (NOTE)     The eGFR has been calculated using the CKD EPI equation.     This calculation has not been validated in all clinical situations.     eGFR's persistently <90 mL/min signify possible Chronic Kidney     Disease.   Anion gap 13  5 - 15  CBC     Status: None   Collection Time    09/08/13 12:49 PM      Result Value Ref Range   WBC 5.7  4.0 -  10.5 K/uL   RBC 4.65  4.22 - 5.81 MIL/uL   Hemoglobin 13.8  13.0 - 17.0 g/dL   HCT 41.1  39.0 - 52.0 %   MCV 88.4  78.0 - 100.0 fL   MCH 29.7  26.0 - 34.0 pg   MCHC 33.6  30.0 - 36.0 g/dL   RDW 13.9  11.5 - 15.5 %   Platelets 220  150 - 400 K/uL   No results found.  ROS: otherwise negative  Blood pressure 166/82, pulse 92, temperature 97.1 F (36.2 C), temperature source Oral, resp. rate 18, weight 130 lb (58.968 kg), SpO2 100.00%.  PHYSICAL EXAM: Overall appearance:  Healthy appearing, in no distress Head:  Normocephalic, atraumatic. Ears: Crusted lesion left scaphoid. Nose: External nose is healthy in appearance. Internal nasal exam free of any lesions or obstruction. Oral Cavity/pharynx:  There are no mucosal lesions or masses identified. Neuro:  No identifiable neurologic deficits. Neck: No palpable neck masses.  Studies Reviewed: none    Assessment/Plan Excision of left auricular cancer.  Tyyonna Soucy 09/08/2013, 2:38 PM

## 2013-09-08 NOTE — Transfer of Care (Signed)
Immediate Anesthesia Transfer of Care Note  Patient: Paul [name]  Procedure(s) Performed: Procedure(s): EXCISION OF LEFT PINNA SKIN CANCER WITH FROZEN SECTION  (Left)  Patient Location: PACU  Anesthesia Type:MAC  Level of Consciousness: awake and alert   Airway & Oxygen Therapy: Patient Spontanous Breathing and Patient connected to nasal cannula oxygen  Post-op Assessment: Report given to PACU RN, Post -op Vital signs reviewed and stable and Patient moving all extremities X 4  Post vital signs: Reviewed and stable  Complications: No apparent anesthesia complications

## 2013-09-08 NOTE — Progress Notes (Signed)
Pt very concerned about Jaw pain, md discussed with pt again that he did not fix his jaw but only his ear, family aware and also discussed with patient, pt reports understanding

## 2013-09-08 NOTE — Discharge Instructions (Signed)
Keep the left ear clean and dry. Apply bacitracin ointment to both sides of the ear twice daily. Keep a cotton ball inside the ear.

## 2013-09-09 ENCOUNTER — Encounter (HOSPITAL_COMMUNITY): Payer: Self-pay | Admitting: Otolaryngology

## 2013-09-09 NOTE — Anesthesia Postprocedure Evaluation (Signed)
Anesthesia Post Note  Patient: Paul [name]  Procedure(s) Performed: Procedure(s) (LRB): EXCISION OF LEFT PINNA SKIN CANCER WITH FROZEN SECTION  (Left)  Anesthesia type: MAC  Patient location: PACU  Post pain: Pain level controlled  Post assessment: Patient's Cardiovascular Status Stable  Last Vitals:  Filed Vitals:   09/08/13 1645  BP: 143/73  Pulse: 78  Temp:   Resp: 16    Post vital signs: Reviewed and stable  Level of consciousness: sedated  Complications: No apparent anesthesia complications

## 2013-09-12 ENCOUNTER — Telehealth: Payer: Self-pay | Admitting: Neurology

## 2013-09-12 NOTE — Telephone Encounter (Signed)
Pt no showed 09/07/13 appt w/ Dr. Tomi Likens. No show letter sent to pt / Sherri S.

## 2013-09-13 ENCOUNTER — Encounter: Payer: Self-pay | Admitting: *Deleted

## 2014-02-03 ENCOUNTER — Encounter (HOSPITAL_COMMUNITY): Payer: Self-pay | Admitting: Orthopedic Surgery

## 2014-03-25 ENCOUNTER — Ambulatory Visit: Payer: Medicare Other | Admitting: Diagnostic Neuroimaging

## 2014-04-04 ENCOUNTER — Telehealth: Payer: Self-pay | Admitting: *Deleted

## 2014-04-04 ENCOUNTER — Encounter: Payer: Self-pay | Admitting: Diagnostic Neuroimaging

## 2014-04-04 ENCOUNTER — Ambulatory Visit (INDEPENDENT_AMBULATORY_CARE_PROVIDER_SITE_OTHER): Payer: Medicare Other | Admitting: Diagnostic Neuroimaging

## 2014-04-04 VITALS — BP 118/78 | HR 84 | Ht 70.0 in | Wt 136.4 lb

## 2014-04-04 DIAGNOSIS — R413 Other amnesia: Secondary | ICD-10-CM | POA: Diagnosis not present

## 2014-04-04 DIAGNOSIS — C3490 Malignant neoplasm of unspecified part of unspecified bronchus or lung: Secondary | ICD-10-CM | POA: Diagnosis not present

## 2014-04-04 DIAGNOSIS — Z720 Tobacco use: Secondary | ICD-10-CM

## 2014-04-04 DIAGNOSIS — G501 Atypical facial pain: Secondary | ICD-10-CM | POA: Diagnosis not present

## 2014-04-04 NOTE — Patient Instructions (Signed)
I will contact your primary care doctor to help coordinate your care.

## 2014-04-04 NOTE — Telephone Encounter (Signed)
Spoke with the receptionist at the [facility] clinic for [facility] FNP and explained that the pt was seen today and asked about his next follow-up with the office. She stated it was Wednesday. I asked that they please call him again and remind him as he had forgotten.  I also expressed that Dr. Leta Baptist wanted the pt to get Home Health management and a palliative consult for pain management/ end-of-life goals. She asked me to fax a copy of the office notes when Dr. Mamie Nick was done.

## 2014-04-04 NOTE — Progress Notes (Signed)
[facility]  PATIENT: Paul [name] DOB: 11/21/1930  REFERRING CLINICIAN: D Smothers, NP HISTORY FROM: patient (EPIC chart review; outside PCP and neurology records reviewed) REASON FOR VISIT: new consult   HISTORICAL  CHIEF COMPLAINT:  Chief Complaint  Patient presents with  . 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 Patient presents for consultation of left facial pain, trigeminal neuralgia at request of D Smothers, NP (PCP).  Patient presents to this visit alone. He is significant hard of hearing, with low educational status, poor memory of prior evaluations, which makes this evaluation challenging. He states that he has had left facial pain for several years and has seen ear nose throat several times, who ultimately performed a surgery on his left ear. He states that no one is helping him with his left facial pain. He does not remember seeing a neurologist in June 2015 (of which I reviewed the notes in the computer system). He does not remember being in the hospital for strokes in May 2015 (of which I reviewed the notes in the computer system). Multiple instances are noted in the medical record of missed appointments, poor follow-up, poor compliance, possible dementia diagnosis. Apparently patient is living alone, driving, taking care of himself. He has a friend who helps him at times but is not here for this visit. He denies any close family members that he has contact with. He says that there are some friends who may check in on him once in a while. He does not know their names or phone numbers.   REVIEW OF SYSTEMS: Full 14 system review of systems performed and notable only for only as per history of present illness.   ALLERGIES: Allergies    Allergen Reactions  . No Known Allergies     HOME MEDICATIONS: Outpatient Prescriptions Prior to Visit  Medication Sig Dispense Refill  . phenytoin (DILANTIN) 100 MG ER capsule Take 300 mg by mouth daily.    Marland Kitchen warfarin (COUMADIN) 5 MG tablet Take 5 mg by mouth daily.    . cephALEXin (KEFLEX) 500 MG capsule Take 1 capsule (500 mg total) by mouth 3 (three) times daily. 15 capsule 0  . HYDROcodone-acetaminophen (NORCO) 7.5-325 MG per tablet Take 1 tablet by mouth every 6 (six) hours as needed for moderate pain.    Marland Kitchen HYDROcodone-acetaminophen (NORCO) 7.5-325 MG per tablet Take 1 tablet by mouth every 6 (six) hours as needed for moderate pain. 30 tablet 0  . promethazine (PHENERGAN) 25 MG suppository Place 1 suppository (25 mg total) rectally every 6 (six) hours as needed for nausea or vomiting. 12 suppository 1   No facility-administered medications prior to visit.    PAST MEDICAL HISTORY: Past Medical History  Diagnosis Date  . Hypertension   . COPD (chronic obstructive pulmonary disease)   . Hemorrhoids     external  . 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 GERD (gastroesophageal reflux disease)   . Emphysema   . HOH (hard of hearing)   . Pneumonia   . Seizures   . Headache(784.0)   . Non-small cell carcinoma of lung dx'd 2006    stage 3  . Lung cancer   . Stroke     PAST SURGICAL HISTORY: Past Surgical History  Procedure Laterality Date  .  Inner ear surgery    . Toe amputation Left   . Tonsillectomy    . Amputation Left 04/16/2013    Procedure: AMPUTATION RAY;  Surgeon: Newt Minion, MD;  Location: [place];  Service: Orthopedics;  Laterality: Left;  Left Foot 4th and 5th Ray Amputation  . Lipoma excision Left 09/08/2013    Procedure: EXCISION OF LEFT PINNA SKIN CANCER WITH FROZEN SECTION ;  Surgeon: Izora Gala, MD;  Location: [facility] OR;  Service: ENT;  Laterality: Left;    FAMILY HISTORY: Family History  Problem Relation Age of Onset  . Heart failure  Brother     SOCIAL HISTORY:  History   Social History  . Marital Status: Divorced    Spouse Name: N/A  . Number of Children: 2  . Years of Education: 12   Occupational History  . Not on file.   Social History Main Topics  . Smoking status: Current Every Day Smoker -- 1.50 packs/day for 60 years    Types: Cigarettes  . Smokeless tobacco: Never Used  . Alcohol Use: No  . Drug Use: No  . Sexual Activity: No   Other Topics Concern  . Not on file   Social History Narrative   Lives alone    Drinks a lot of caffeine per the pt (5 or greater a day)   Right handed     PHYSICAL EXAM  Filed Vitals:   04/04/14 1018  BP: 118/78  Pulse: 84  Height: 5\' 10"  (1.778 m)  Weight: 136 lb 6.4 oz (61.871 kg)    Body mass index is 19.57 kg/(m^2).   Visual Acuity Screening   Right eye Left eye Both eyes  Without correction: unable unable   With correction:       No flowsheet data found.  GENERAL EXAM: Patient is in no distress; well developed, nourished and groomed; neck is supple; POST-SURG LEFT EAR  CARDIOVASCULAR: Regular rate and rhythm, no murmurs, no carotid bruits  NEUROLOGIC: MENTAL STATUS: awake, alert, oriented to person, place and time "April 04, 2014, [facility]", REGISTERS AND RECALLS 3/3. DEFICITS IN MEMORY RE: TESTING AND TREATMENT IN 2015. CANNOT SUBTRACT 100-7. DECR ATTENTION AND CONCENTRATION. LANGUAGE FLUENT BUT REFLECTS LOW EDUCATIONAL STATUS, comprehension intact, naming intact, fund of knowledge appropriate CRANIAL NERVE: no papilledema on fundoscopic exam, pupils equal and reactive to light, visual fields full to confrontation, extraocular muscles intact, no nystagmus, facial sensation and strength symmetric, SEVERE HEARING LOSS, palate elevates symmetrically, uvula midline, shoulder shrug symmetric, tongue midline. MOTOR: normal bulk and tone, full strength in the BUE, BLE SENSORY: normal and symmetric to light touch, pinprick, temperature,  vibration COORDINATION: finger-nose-finger, fine finger movements normal REFLEXES: deep tendon reflexes TRACE and symmetric GAIT/STATION: narrow based gait; romberg is negative    DIAGNOSTIC DATA (LABS, IMAGING, TESTING) - I reviewed patient records, labs, notes, testing and imaging myself where available.  Lab Results  Component Value Date   WBC 5.7 09/08/2013   HGB 13.8 09/08/2013   HCT 41.1 09/08/2013   MCV 88.4 09/08/2013   PLT 220 09/08/2013      Component Value Date/Time   NA 137 09/08/2013 1249   NA 140 12/25/2012 0832   NA 142 02/15/2011 0956   K 4.3 09/08/2013 1249   K 4.7 12/25/2012 0832   K 4.4 02/15/2011 0956   CL 100 09/08/2013 1249   CL 100 04/16/2012 0832   CL 99 02/15/2011 0956   CO2 24 09/08/2013 1249   CO2 27 12/25/2012 4403  CO2 29 02/15/2011 0956   GLUCOSE 108* 09/08/2013 1249   GLUCOSE 123 12/25/2012 0832   GLUCOSE 113* 04/16/2012 0832   GLUCOSE 137* 02/15/2011 0956   BUN 9 09/08/2013 1249   BUN 7.5 12/25/2012 0832   BUN 8 02/15/2011 0956   CREATININE 0.69 09/08/2013 1249   CREATININE 0.8 12/25/2012 0832   CREATININE 0.5* 02/15/2011 0956   CALCIUM 9.1 09/08/2013 1249   CALCIUM 9.3 12/25/2012 0832   CALCIUM 8.8 02/15/2011 0956   PROT 6.9 04/09/2013 1214   PROT 6.9 12/25/2012 0832   PROT 7.4 02/15/2011 0956   ALBUMIN 3.6 04/09/2013 1214   ALBUMIN 3.6 12/25/2012 0832   AST 19 04/09/2013 1214   AST 14 12/25/2012 0832   AST 18 02/15/2011 0956   ALT 14 04/09/2013 1214   ALT 13 12/25/2012 0832   ALT 19 02/15/2011 0956   ALKPHOS 117 04/09/2013 1214   ALKPHOS 118 12/25/2012 0832   ALKPHOS 109* 02/15/2011 0956   BILITOT 0.2* 04/09/2013 1214   BILITOT 0.32 12/25/2012 0832   BILITOT 0.50 02/15/2011 0956   GFRNONAA 87* 09/08/2013 1249   GFRAA >90 09/08/2013 1249   Lab Results  Component Value Date   CHOL 159 06/11/2013   HDL 40 06/11/2013   LDLCALC 94 06/11/2013   TRIG 124 06/11/2013   CHOLHDL 4.0 06/11/2013   Lab Results  Component  Value Date   HGBA1C 5.7* 06/11/2013   No results found for: [id number] Lab Results  Component Value Date   TSH 2.741 04/02/2013    I reviewed images myself and agree with interpretation. -VRP  06/10/13 MRI BRAIN 1. Scattered small acute infarcts. About 6 lesions are identified in the posterior cerebral and cerebellar hemispheres. No associated mass effect or hemorrhage. 2. The pattern raises the possibility of a recent embolic event. Synchronous small vessel ischemia it is less likely.  06/10/13 MRA HEAD  3. Posterior circulation remarkable for poor flow in the distal right vertebral artery. This could be related to the acute findings.  4. Anterior circulation remarkable for high-grade stenosis of the left ICA siphon. 5. No major circle of Willis branch occlusion identified.  06/11/13 CAROTID U/S Bilateral: 1-39% ICA stenosis.  Right: Vertebral artery flow is retrograde. ICA/CCA ratio is 1.03.  Left: Vertebral artery flow is antegrade. ICA/CCA ratio is 0.88. Moderate ECA stenosis.  06/11/13 TTE  Left ventricle: The cavity size was normal. Wall thickness was normal. Systolic function was normal. The estimated ejection fraction was in the range of 55% to 60%. Wall motion was normal; there were no regional wall motion abnormalities. Doppler parameters are consistent with abnormal left ventricularrelaxation (grade 1 diastolic dysfunction). - Mitral valve: Mild prolapse, involving the anterior leaflet. There was mild regurgitation.      ASSESSMENT AND PLAN  81 y.o. year old male here with chronic tobacco abuse, non-small cell lung cancer, hypertension, COPD, DVT/PE, remote seizure disorder, embolic strokes, presented for evaluation of left facial pain, starting sometime in 2014-2015.  May represent atypical facial pain or trigeminal neuralgia. No clear etiology. He was evaluated by another neurologist in June 2015 recommended to start lamotrigine and follow-up in 2 months. Unfortunately  patient did not take this medication nor did he follow up. Patient has significant memory deficits and poor social support. He has low educational status and significant hearing loss. In summary his compliance and ability to understand directions is very much in question. Extensive review of [facility] chart demonstrates multiple instances of missed appointments, confusion on following medical  directions and non-compliance. Also has had stroke diagnosis without follow up in stroke clinic.  PROBLEM 1 - Ddx facial pain: trigeminal neuralgia vs atypical facial pain; could be idiopathic, paraneoplastic, inflamm, or vascular; worsening  PROBLEM 2  -stroke, cardioembolic; stable  PROBLEM 3 - non-small cell lung CA; stable  PROBLEM 4 - cognitive impairment, decr hearing, poor social support --> questionable safety of living alone   PLAN: - advised patient to return to PCP; he needs to be stabilized with his home/living situation. He is living alone, continuing to drive, continuing to smoke, with poor social support. He has significant memory deficits and hearing loss. I am not sure he has the ability to live independently. Additional testing or treatment would be futile at this point due to his unreliability to understand and follow instructions, which is due to suspected underlying dementia, severe hearing loss, and low educational status. He would benefit from home health agency and social services. He would benefit from palliative care as well for pain mgmt. Carbamazepine could be tried, but not until he is in a supervised setting where we could monitor compliance and proper usage of medications. I will ask his PCP to arrange these services on his behalf.  Return for return to PCP.    Penni Bombard, MD 7/62/2633, 35:45 AM Certified in Neurology, Neurophysiology and Neuroimaging  [facility] [address] [place],  [address] [phone]

## 2014-04-29 ENCOUNTER — Telehealth: Payer: Self-pay | Admitting: Internal Medicine

## 2014-04-29 NOTE — Telephone Encounter (Signed)
returned call and sched appt.Marland KitchenMarland KitchenMarland KitchenMarland Kitchenpt ok and aware

## 2014-05-24 ENCOUNTER — Ambulatory Visit: Payer: Medicare Other | Admitting: Internal Medicine

## 2014-07-01 ENCOUNTER — Emergency Department (HOSPITAL_COMMUNITY): Payer: Medicare Other

## 2014-07-01 ENCOUNTER — Emergency Department (HOSPITAL_COMMUNITY)
Admission: EM | Admit: 2014-07-01 | Discharge: 2014-07-01 | Disposition: A | Discharge status: Home / Self Care | Payer: Medicare Other | Attending: Emergency Medicine | Admitting: Emergency Medicine

## 2014-07-01 ENCOUNTER — Encounter (HOSPITAL_COMMUNITY): Payer: Self-pay | Admitting: Emergency Medicine

## 2014-07-01 DIAGNOSIS — G40909 Epilepsy, unspecified, not intractable, without status epilepticus: Secondary | ICD-10-CM | POA: Insufficient documentation

## 2014-07-01 DIAGNOSIS — J449 Chronic obstructive pulmonary disease, unspecified: Secondary | ICD-10-CM | POA: Insufficient documentation

## 2014-07-01 DIAGNOSIS — H9190 Unspecified hearing loss, unspecified ear: Secondary | ICD-10-CM | POA: Insufficient documentation

## 2014-07-01 DIAGNOSIS — Z7901 Long term (current) use of anticoagulants: Secondary | ICD-10-CM | POA: Insufficient documentation

## 2014-07-01 DIAGNOSIS — I10 Essential (primary) hypertension: Secondary | ICD-10-CM | POA: Insufficient documentation

## 2014-07-01 DIAGNOSIS — R569 Unspecified convulsions: Secondary | ICD-10-CM

## 2014-07-01 DIAGNOSIS — Z8719 Personal history of other diseases of the digestive system: Secondary | ICD-10-CM | POA: Insufficient documentation

## 2014-07-01 DIAGNOSIS — Z7952 Long term (current) use of systemic steroids: Secondary | ICD-10-CM | POA: Insufficient documentation

## 2014-07-01 DIAGNOSIS — Z79899 Other long term (current) drug therapy: Secondary | ICD-10-CM | POA: Diagnosis not present

## 2014-07-01 DIAGNOSIS — Z8701 Personal history of pneumonia (recurrent): Secondary | ICD-10-CM | POA: Diagnosis not present

## 2014-07-01 DIAGNOSIS — R55 Syncope and collapse: Secondary | ICD-10-CM | POA: Diagnosis present

## 2014-07-01 DIAGNOSIS — Z720 Tobacco use: Secondary | ICD-10-CM | POA: Insufficient documentation

## 2014-07-01 DIAGNOSIS — Z85118 Personal history of other malignant neoplasm of bronchus and lung: Secondary | ICD-10-CM | POA: Insufficient documentation

## 2014-07-01 DIAGNOSIS — R4182 Altered mental status, unspecified: Secondary | ICD-10-CM

## 2014-07-01 DIAGNOSIS — Z8673 Personal history of transient ischemic attack (TIA), and cerebral infarction without residual deficits: Secondary | ICD-10-CM | POA: Insufficient documentation

## 2014-07-01 DIAGNOSIS — H5702 Anisocoria: Secondary | ICD-10-CM | POA: Insufficient documentation

## 2014-07-01 LAB — TROPONIN I

## 2014-07-01 LAB — PHENYTOIN LEVEL, TOTAL: Phenytoin Lvl: 3.5 ug/mL — ABNORMAL LOW (ref 10.0–20.0)

## 2014-07-01 LAB — CBC
HEMATOCRIT: 41.5 % (ref 39.0–52.0)
Hemoglobin: 13.7 g/dL (ref 13.0–17.0)
MCH: 29.5 pg (ref 26.0–34.0)
MCHC: 33 g/dL (ref 30.0–36.0)
MCV: 89.4 fL (ref 78.0–100.0)
Platelets: 247 10*3/uL (ref 150–400)
RBC: 4.64 MIL/uL (ref 4.22–5.81)
RDW: 13.6 % (ref 11.5–15.5)
WBC: 6.6 10*3/uL (ref 4.0–10.5)

## 2014-07-01 LAB — COMPREHENSIVE METABOLIC PANEL
ALK PHOS: 132 U/L — AB (ref 38–126)
ALT: 9 U/L — AB (ref 17–63)
ANION GAP: 6 (ref 5–15)
AST: 12 U/L — ABNORMAL LOW (ref 15–41)
Albumin: 3.2 g/dL — ABNORMAL LOW (ref 3.5–5.0)
BUN: 15 mg/dL (ref 6–20)
CHLORIDE: 101 mmol/L (ref 101–111)
CO2: 28 mmol/L (ref 22–32)
Calcium: 8.3 mg/dL — ABNORMAL LOW (ref 8.9–10.3)
Creatinine, Ser: 0.7 mg/dL (ref 0.61–1.24)
GFR calc non Af Amer: >60 mL/min (ref >60)
GLUCOSE: 114 mg/dL — AB (ref 65–99)
Potassium: 4.4 mmol/L (ref 3.5–5.1)
Sodium: 135 mmol/L (ref 135–145)
Total Bilirubin: 0.4 mg/dL (ref 0.3–1.2)
Total Protein: 6.1 g/dL — ABNORMAL LOW (ref 6.5–8.1)

## 2014-07-01 LAB — PROTIME-INR
INR: 2.71 — AB (ref 0.00–1.49)
Prothrombin Time: 28.3 seconds — ABNORMAL HIGH (ref 11.6–15.2)

## 2014-07-01 LAB — CBG MONITORING, ED: Glucose-Capillary: 95 mg/dL (ref 65–99)

## 2014-07-01 MED ORDER — PHENYTOIN SODIUM 50 MG/ML IJ SOLN
500.0000 mg | Freq: Once | INTRAMUSCULAR | Status: AC
  Administered 2014-07-01: 500 mg via INTRAVENOUS
  Filled 2014-07-01: qty 10

## 2014-07-01 NOTE — ED Notes (Signed)
Olin Hauser will be here at 1930 to transport patient home.
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
## 2014-07-01 NOTE — ED Notes (Signed)
Paul [name] driving patient home. Pt was assisted to lobby via wheelchair by EMT Claiborne Billings B. He was advised to take his medication as prescribed and follow up with PCP.

## 2014-07-01 NOTE — ED Notes (Signed)
Patient wanting to leave, getting somewhat agitated and impatient. Explained to him the process and that we are waiting for lab results to come back. Patient is willing to stay at the moment.

## 2014-07-01 NOTE — ED Notes (Signed)
cbg 95

## 2014-07-01 NOTE — ED Notes (Signed)
Bed: OZ30 Expected date:  Expected time:  Means of arrival:  Comments: Elderly, "unresponsive", responsive now

## 2014-07-01 NOTE — Discharge Instructions (Signed)
Please call your doctor for a followup appointment within 24-48 hours. When you talk to your doctor please let them know that you were seen in the emergency department and have them acquire all of your records so that they can discuss the findings with you and formulate a treatment plan to fully care for your new and ongoing problems. ° °

## 2014-07-01 NOTE — ED Notes (Signed)
Attempted to call Paul [name]  At [phone] to make her aware that it will 30 min before he is discharged.

## 2014-07-13 ENCOUNTER — Emergency Department (HOSPITAL_COMMUNITY)
Admission: EM | Admit: 2014-07-13 | Discharge: 2014-07-13 | Disposition: A | Discharge status: Home / Self Care | Payer: Medicare Other | Attending: Emergency Medicine | Admitting: Emergency Medicine

## 2014-07-13 ENCOUNTER — Encounter (HOSPITAL_COMMUNITY): Payer: Self-pay

## 2014-07-13 ENCOUNTER — Emergency Department (HOSPITAL_COMMUNITY): Payer: Medicare Other

## 2014-07-13 DIAGNOSIS — S0081XA Abrasion of other part of head, initial encounter: Secondary | ICD-10-CM | POA: Diagnosis not present

## 2014-07-13 DIAGNOSIS — R059 Cough, unspecified: Secondary | ICD-10-CM

## 2014-07-13 DIAGNOSIS — Y998 Other external cause status: Secondary | ICD-10-CM | POA: Insufficient documentation

## 2014-07-13 DIAGNOSIS — H9190 Unspecified hearing loss, unspecified ear: Secondary | ICD-10-CM | POA: Diagnosis not present

## 2014-07-13 DIAGNOSIS — S60221A Contusion of right hand, initial encounter: Secondary | ICD-10-CM | POA: Insufficient documentation

## 2014-07-13 DIAGNOSIS — Z79899 Other long term (current) drug therapy: Secondary | ICD-10-CM | POA: Diagnosis not present

## 2014-07-13 DIAGNOSIS — Z85118 Personal history of other malignant neoplasm of bronchus and lung: Secondary | ICD-10-CM | POA: Insufficient documentation

## 2014-07-13 DIAGNOSIS — Z7951 Long term (current) use of inhaled steroids: Secondary | ICD-10-CM | POA: Diagnosis not present

## 2014-07-13 DIAGNOSIS — R05 Cough: Secondary | ICD-10-CM | POA: Insufficient documentation

## 2014-07-13 DIAGNOSIS — Z86718 Personal history of other venous thrombosis and embolism: Secondary | ICD-10-CM | POA: Diagnosis not present

## 2014-07-13 DIAGNOSIS — Z8673 Personal history of transient ischemic attack (TIA), and cerebral infarction without residual deficits: Secondary | ICD-10-CM | POA: Insufficient documentation

## 2014-07-13 DIAGNOSIS — Y92009 Unspecified place in unspecified non-institutional (private) residence as the place of occurrence of the external cause: Secondary | ICD-10-CM | POA: Diagnosis not present

## 2014-07-13 DIAGNOSIS — Z8701 Personal history of pneumonia (recurrent): Secondary | ICD-10-CM | POA: Insufficient documentation

## 2014-07-13 DIAGNOSIS — Z7901 Long term (current) use of anticoagulants: Secondary | ICD-10-CM | POA: Insufficient documentation

## 2014-07-13 DIAGNOSIS — Y9389 Activity, other specified: Secondary | ICD-10-CM | POA: Diagnosis not present

## 2014-07-13 DIAGNOSIS — S40011A Contusion of right shoulder, initial encounter: Secondary | ICD-10-CM | POA: Insufficient documentation

## 2014-07-13 DIAGNOSIS — S60222A Contusion of left hand, initial encounter: Secondary | ICD-10-CM | POA: Insufficient documentation

## 2014-07-13 DIAGNOSIS — G40909 Epilepsy, unspecified, not intractable, without status epilepticus: Secondary | ICD-10-CM | POA: Diagnosis not present

## 2014-07-13 DIAGNOSIS — R569 Unspecified convulsions: Secondary | ICD-10-CM

## 2014-07-13 DIAGNOSIS — W1839XA Other fall on same level, initial encounter: Secondary | ICD-10-CM | POA: Diagnosis not present

## 2014-07-13 DIAGNOSIS — S40012A Contusion of left shoulder, initial encounter: Secondary | ICD-10-CM | POA: Diagnosis not present

## 2014-07-13 DIAGNOSIS — Z8719 Personal history of other diseases of the digestive system: Secondary | ICD-10-CM | POA: Insufficient documentation

## 2014-07-13 DIAGNOSIS — Z7952 Long term (current) use of systemic steroids: Secondary | ICD-10-CM | POA: Diagnosis not present

## 2014-07-13 DIAGNOSIS — Z720 Tobacco use: Secondary | ICD-10-CM | POA: Diagnosis not present

## 2014-07-13 DIAGNOSIS — I10 Essential (primary) hypertension: Secondary | ICD-10-CM | POA: Insufficient documentation

## 2014-07-13 DIAGNOSIS — W19XXXA Unspecified fall, initial encounter: Secondary | ICD-10-CM

## 2014-07-13 LAB — COMPREHENSIVE METABOLIC PANEL
ALBUMIN: 3.9 g/dL (ref 3.5–5.0)
ALT: 25 U/L (ref 17–63)
ANION GAP: 7 (ref 5–15)
AST: 25 U/L (ref 15–41)
Alkaline Phosphatase: 138 U/L — ABNORMAL HIGH (ref 38–126)
BUN: 11 mg/dL (ref 6–20)
CHLORIDE: 97 mmol/L — AB (ref 101–111)
CO2: 28 mmol/L (ref 22–32)
CREATININE: 0.83 mg/dL (ref 0.61–1.24)
Calcium: 8.9 mg/dL (ref 8.9–10.3)
GFR calc Af Amer: >60 mL/min (ref >60)
GFR calc non Af Amer: >60 mL/min (ref >60)
Glucose, Bld: 126 mg/dL — ABNORMAL HIGH (ref 65–99)
Potassium: 4.4 mmol/L (ref 3.5–5.1)
Sodium: 132 mmol/L — ABNORMAL LOW (ref 135–145)
TOTAL PROTEIN: 6.9 g/dL (ref 6.5–8.1)
Total Bilirubin: 1.1 mg/dL (ref 0.3–1.2)

## 2014-07-13 LAB — PHENYTOIN LEVEL, TOTAL: Phenytoin Lvl: 19.8 ug/mL (ref 10.0–20.0)

## 2014-07-13 LAB — CBC WITH DIFFERENTIAL/PLATELET
BASOS PCT: 0 % (ref 0–1)
Basophils Absolute: 0 10*3/uL (ref 0.0–0.1)
EOS ABS: 0 10*3/uL (ref 0.0–0.7)
Eosinophils Relative: 0 % (ref 0–5)
HCT: 41.2 % (ref 39.0–52.0)
Hemoglobin: 14.1 g/dL (ref 13.0–17.0)
LYMPHS ABS: 0.6 10*3/uL — AB (ref 0.7–4.0)
Lymphocytes Relative: 7 % — ABNORMAL LOW (ref 12–46)
MCH: 29 pg (ref 26.0–34.0)
MCHC: 34.2 g/dL (ref 30.0–36.0)
MCV: 84.6 fL (ref 78.0–100.0)
Monocytes Absolute: 1 10*3/uL (ref 0.1–1.0)
Monocytes Relative: 11 % (ref 3–12)
Neutro Abs: 6.9 10*3/uL (ref 1.7–7.7)
Neutrophils Relative %: 82 % — ABNORMAL HIGH (ref 43–77)
Platelets: 258 10*3/uL (ref 150–400)
RBC: 4.87 MIL/uL (ref 4.22–5.81)
RDW: 13 % (ref 11.5–15.5)
WBC: 8.5 10*3/uL (ref 4.0–10.5)

## 2014-07-13 LAB — PROTIME-INR
INR: 2.1 — ABNORMAL HIGH (ref 0.00–1.49)
PROTHROMBIN TIME: 23.4 s — AB (ref 11.6–15.2)

## 2014-07-13 MED ORDER — DOXYCYCLINE HYCLATE 100 MG PO CAPS: 100.0000 mg | ORAL_CAPSULE | Freq: Two times a day (BID) | ORAL | Status: DC

## 2014-07-13 MED ORDER — PREDNISONE 20 MG PO TABS: 20.0000 mg | ORAL_TABLET | Freq: Two times a day (BID) | ORAL | Status: DC

## 2014-07-13 MED ORDER — ALBUTEROL SULFATE (2.5 MG/3ML) 0.083% IN NEBU
2.5000 mg | INHALATION_SOLUTION | RESPIRATORY_TRACT | Status: DC | PRN
  Administered 2014-07-13: 2.5 mg via RESPIRATORY_TRACT
  Filled 2014-07-13: qty 3

## 2014-07-13 MED ORDER — SODIUM CHLORIDE 0.9 % IV SOLN
1000.0000 mg | Freq: Once | INTRAVENOUS | Status: AC
  Administered 2014-07-13: 1000 mg via INTRAVENOUS
  Filled 2014-07-13: qty 20

## 2014-07-13 MED ORDER — METHYLPREDNISOLONE SODIUM SUCC 125 MG IJ SOLR
125.0000 mg | Freq: Once | INTRAMUSCULAR | Status: AC
  Administered 2014-07-13: 125 mg via INTRAVENOUS
  Filled 2014-07-13: qty 2

## 2014-07-13 NOTE — ED Notes (Signed)
Family called and said that he's been having seizures and falling for several weeks, he's not been taking his medication, he also smokes 5 packs of cigarettes a day and has recently been given medication for wheezing and doesn't take that either

## 2014-07-13 NOTE — ED Notes (Signed)
Per EMS, patient has been having seizures ans falling, he currently has old blood on him and in his house, pt is slightly demented, pt not taking his medications

## 2014-07-13 NOTE — ED Notes (Signed)
Pt is demented, cussing and yelling at RN when RN trying to put hospital gown on, place on monitor and get EKG, pt refusing to let RN do any of the following, refusing to let RN get blood work or start IV.

## 2014-07-13 NOTE — ED Notes (Signed)
Antibiotic gauze placed over wounds on bil arms, wrapped w/ gauze.

## 2014-07-13 NOTE — ED Notes (Signed)
Patient transported to CT 

## 2014-07-13 NOTE — ED Notes (Signed)
UNABLE TO COLLECT LABS AT THIS TIME PATIENT NOT IN ROOM.

## 2014-07-13 NOTE — ED Provider Notes (Signed)
CSN: [id number]     Arrival date & time 07/13/14  2026 History   First MD Initiated Contact with Patient 07/13/14 2051     Chief Complaint  Patient presents with  . Seizures    Social history evaluation after a seizure at home. Family states that he had a seizure 2 days ago and then yesterday. He states that he has not taken his Dilantin yesterday or today that took it yesterday morning.  Has history of lung cancer. History of a DVT 10 years ago for which she still takes Coumadin.  Daughters accompany him here. This is he does have some mild dementia. But he continues to live independently.  He has no complaints HPI  Past Medical History  Diagnosis Date  . Hypertension   . COPD (chronic obstructive pulmonary disease)   . Hemorrhoids     external  . 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 GERD (gastroesophageal reflux disease)   . Emphysema   . HOH (hard of hearing)   . Pneumonia   . Seizures   . Headache(784.0)   . Non-small cell carcinoma of lung dx'd 2006    stage 3  . Lung cancer   . Stroke    Past Surgical History  Procedure Laterality Date  . Inner ear surgery    . Toe amputation Left   . Tonsillectomy    . Amputation Left 04/16/2013    Procedure: AMPUTATION RAY;  Surgeon: Newt Minion, MD;  Location: [place];  Service: Orthopedics;  Laterality: Left;  Left Foot 4th and 5th Ray Amputation  . 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 Heart failure Brother    History  Substance Use Topics  . Smoking status: Current Every Day Smoker -- 1.50 packs/day for 60 years    Types: Cigarettes  . Smokeless tobacco: Never Used  . Alcohol Use: No    Review of Systems  Constitutional: Negative for fever, chills, diaphoresis, appetite change and fatigue.  HENT: Negative  for mouth sores, sore throat and trouble swallowing.   Eyes: Negative for visual disturbance.  Respiratory: Negative for cough, chest tightness, shortness of breath and wheezing.   Cardiovascular: Negative for chest pain.  Gastrointestinal: Negative for nausea, vomiting, abdominal pain, diarrhea and abdominal distention.  Endocrine: Negative for polydipsia, polyphagia and polyuria.  Genitourinary: Negative for dysuria, frequency and hematuria.  Musculoskeletal: Negative for gait problem.  Skin: Negative for color change, pallor and rash.  Neurological: Positive for seizures. Negative for dizziness, syncope, light-headedness and headaches.  Hematological: Does not bruise/bleed easily.  Psychiatric/Behavioral: Negative for behavioral problems and confusion.      Allergies  Review of patient's allergies indicates no known allergies.  Home Medications   Prior to Admission medications   Medication Sig Start Date End Date Taking? Authorizing Provider  Fluticasone-Salmeterol (ADVAIR) 250-50 MCG/DOSE AEPB Inhale 1 puff into the lungs 2 (two) times daily.   Yes Historical Provider, MD  gabapentin (NEURONTIN) 300 MG capsule Take 300 mg by mouth every evening.   Yes Historical Provider, MD  ipratropium-albuterol (DUONEB) 0.5-2.5 (3) MG/3ML SOLN Inhale 3 mL by nebulization every six (6) hours as needed. For shortness of breath/wheezing 07/06/14 07/06/15 Yes Historical Provider, MD  warfarin (COUMADIN) 5 MG tablet Take  5 mg by mouth daily.   Yes Historical Provider, MD  amoxicillin-clavulanate (AUGMENTIN) 875-125 MG per tablet TK 1 T PO  Q 12 HOURS FOR 10 DAYS 07/06/14   Historical Provider, MD  doxycycline (VIBRAMYCIN) 100 MG capsule Take 1 capsule (100 mg total) by mouth 2 (two) times daily. 07/13/14   Tanna Furry, MD  hydrocortisone 2.5 % cream Apply 1 application topically daily.  09/03/13   Historical Provider, MD  phenytoin (DILANTIN) 100 MG ER capsule Take 300 mg by mouth daily.    Historical  Provider, MD  predniSONE (DELTASONE) 20 MG tablet Take 1 tablet (20 mg total) by mouth 2 (two) times daily with a meal. 07/13/14   Tanna Furry, MD   BP 113/61 mmHg  Pulse 71  Temp(Src) 98 F (36.7 C) (Oral)  Resp 19  SpO2 99% Physical Exam  Constitutional: He is oriented to person, place, and time. He appears well-developed and well-nourished. No distress.  HENT:  Head: Normocephalic.  No clinical signs of basilar skull fracture. Intact cranial nerves.  Eyes: Conjunctivae are normal. Pupils are equal, round, and reactive to light. No scleral icterus.  Neck: Normal range of motion. Neck supple. No thyromegaly present.  Cardiovascular: Normal rate and regular rhythm.  Exam reveals no gallop and no friction rub.   No murmur heard. Pulmonary/Chest: Effort normal and breath sounds normal. No respiratory distress. He has no wheezes. He has no rales.  Abdominal: Soft. Bowel sounds are normal. He exhibits no distension. There is no tenderness. There is no rebound.  Musculoskeletal: Normal range of motion.  Neurological: He is alert and oriented to person, place, and time.  Skin: Skin is warm and dry. No rash noted.     Multiple areas of abrasion and contusion and subcutaneous ecchymosis.  Psychiatric: He has a normal mood and affect. His behavior is normal.    ED Course  Procedures (including critical care time) Labs Review Labs Reviewed  CBC WITH DIFFERENTIAL/PLATELET - Abnormal; Notable for the following:    Neutrophils Relative % 82 (*)    Lymphocytes Relative 7 (*)    Lymphs Abs 0.6 (*)    All other components within normal limits  COMPREHENSIVE METABOLIC PANEL - Abnormal; Notable for the following:    Sodium 132 (*)    Chloride 97 (*)    Glucose, Bld 126 (*)    Alkaline Phosphatase 138 (*)    All other components within normal limits  PROTIME-INR - Abnormal; Notable for the following:    Prothrombin Time 23.4 (*)    INR 2.10 (*)    All other components within normal limits    PHENYTOIN LEVEL, TOTAL  PHENYTOIN LEVEL, FREE AND TOTAL    Imaging Review Dg Chest 2 View  07/13/2014   CLINICAL DATA:  Patient with seizures and multiple falls. Not taking medications.  EXAM: CHEST  2 VIEW  COMPARISON:  Chest radiograph 07/01/2014  FINDINGS: Monitoring leads overlie the patient. Stable cardiac and mediastinal contours given differences in patient rotation. Re- demonstrated probable scarring within the right upper and right lower lung. Additionally stable probable scarring within left lower lung. No new areas of consolidation identified. Mid thoracic spine degenerative changes.  IMPRESSION: Chronic changes with scarring bilaterally. Re- demonstrated right upper lobe opacity which may correspond with site of prior tumor. Residual tumor not excluded.   Electronically Signed   By: Lovey Newcomer M.D.   On: 07/13/2014 22:48     EKG Interpretation None      MDM  Final diagnoses:  Fall  Cough  Seizure    Patient is nonfocal awake alert oriented. Normal cranial nerve exam normal peripheral neurological exam. Ambulatory. Was told initially historical and will not be available tonight. He was given a gram of IV Dilantin. His Dilantin does return in 26. He has stated that he had not taken it for a day or 2. Discussed with him not taking his Dilantin tonight or in the morning and then resuming the following day. He has had multiple seizures and several falls over the last several weeks. Excluded because the history of a DVT in his leg over 10 years ago. I have advised him to stop taking his Coumadin last sling symptoms with frequent falls. Also asked him to address this with his primary care physician.    Tanna Furry, MD 07/13/14 [phone]

## 2014-07-13 NOTE — Discharge Instructions (Signed)
Stop taking Coumadin. Do not take dilantin tonight or tomorrow. Resume your dilantin on Friday. Use your inhaler 2 puffs every 4 hours when you are awake. Return here with any worsening symptoms. Absolutely NO DRIVING!   Cough, Adult  A cough is a reflex that helps clear your throat and airways. It can help heal the body or may be a reaction to an irritated airway. A cough may only last 2 or 3 weeks (acute) or may last more than 8 weeks (chronic).  CAUSES Acute cough:  Viral or bacterial infections. Chronic cough:  Infections.  Allergies.  Asthma.  Post-nasal drip.  Smoking.  Heartburn or acid reflux.  Some medicines.  Chronic lung problems (COPD).  Cancer. SYMPTOMS   Cough.  Fever.  Chest pain.  Increased breathing rate.  High-pitched whistling sound when breathing (wheezing).  Colored mucus that you cough up (sputum). TREATMENT   A bacterial cough may be treated with antibiotic medicine.  A viral cough must run its course and will not respond to antibiotics.  Your caregiver may recommend other treatments if you have a chronic cough. HOME CARE INSTRUCTIONS   Only take over-the-counter or prescription medicines for pain, discomfort, or fever as directed by your caregiver. Use cough suppressants only as directed by your caregiver.  Use a cold steam vaporizer or humidifier in your bedroom or home to help loosen secretions.  Sleep in a semi-upright position if your cough is worse at night.  Rest as needed.  Stop smoking if you smoke. SEEK IMMEDIATE MEDICAL CARE IF:   You have pus in your sputum.  Your cough starts to worsen.  You cannot control your cough with suppressants and are losing sleep.  You begin coughing up blood.  You have difficulty breathing.  You develop pain which is getting worse or is uncontrolled with medicine.  You have a fever. MAKE SURE YOU:   Understand these instructions.  Will watch your condition.  Will get  help right away if you are not doing well or get worse. Document Released: 07/06/2010 Document Revised: 04/01/2011 Document Reviewed: 07/06/2010 [facility] Patient Information 2015 [place], [place]. This information is not intended to replace advice given to you by your health care provider. Make sure you discuss any questions you have with your health care provider.  Epilepsy Epilepsy is a disorder in which a person has repeated seizures over time. A seizure is a release of abnormal electrical activity in the brain. Seizures can cause a change in attention, behavior, or the ability to remain awake and alert (altered mental status). Seizures often involve uncontrollable shaking (convulsions).  Most people with epilepsy lead normal lives. However, people with epilepsy are at an increased risk of falls, accidents, and injuries. Therefore, it is important to begin treatment right away. CAUSES  Epilepsy has many possible causes. Anything that disturbs the normal pattern of brain cell activity can lead to seizures. This may include:   Head injury.  Birth trauma.  High fever as a child.  Stroke.  Bleeding into or around the brain.  Certain drugs.  Prolonged low oxygen, such as what occurs after CPR efforts.  Abnormal brain development.  Certain illnesses, such as meningitis, encephalitis (brain infection), malaria, and other infections.  An imbalance of nerve signaling chemicals (neurotransmitters).  SIGNS AND SYMPTOMS  The symptoms of a seizure can vary greatly from one person to another. Right before a seizure, you may have a warning (aura) that a seizure is about to occur. An aura may include  the following symptoms:  Fear or anxiety.  Nausea.  Feeling like the room is spinning (vertigo).  Vision changes, such as seeing flashing lights or spots. Common symptoms during a seizure include:  Abnormal sensations, such as an abnormal smell or a bitter taste in the mouth.   Sudden,  general body stiffness.   Convulsions that involve rhythmic jerking of the face, arm, or leg on one or both sides.   Sudden change in consciousness.   Appearing to be awake but not responding.   Appearing to be asleep but cannot be awakened.   Grimacing, chewing, lip smacking, drooling, tongue biting, or loss of bowel or bladder control. After a seizure, you may feel sleepy for a while. DIAGNOSIS  Your health care provider will ask about your symptoms and take a medical history. Descriptions from any witnesses to your seizures will be very helpful in the diagnosis. A physical exam, including a detailed neurological exam, is necessary. Various tests may be done, such as:   An electroencephalogram (EEG). This is a painless test of your brain waves. In this test, a diagram is created of your brain waves. These diagrams can be interpreted by a specialist.  An MRI of the brain.   A CT scan of the brain.   A spinal tap (lumbar puncture, LP).  Blood tests to check for signs of infection or abnormal blood chemistry. TREATMENT  There is no cure for epilepsy, but it is generally treatable. Once epilepsy is diagnosed, it is important to begin treatment as soon as possible. For most people with epilepsy, seizures can be controlled with medicines. The following may also be used:  A pacemaker for the brain (vagus nerve stimulator) can be used for people with seizures that are not well controlled by medicine.  Surgery on the brain. For some people, epilepsy eventually goes away. HOME CARE INSTRUCTIONS   Follow your health care provider's recommendations on driving and safety in normal activities.  Get enough rest. Lack of sleep can cause seizures.  Only take over-the-counter or prescription medicines as directed by your health care provider. Take any prescribed medicine exactly as directed.  Avoid any known triggers of your seizures.  Keep a seizure diary. Record what you recall  about any seizure, especially any possible trigger.   Make sure the people you live and work with know that you are prone to seizures. They should receive instructions on how to help you. In general, a witness to a seizure should:   Cushion your head and body.   Turn you on your side.   Avoid unnecessarily restraining you.   Not place anything inside your mouth.   Call for emergency medical help if there is any question about what has occurred.   Follow up with your health care provider as directed. You may need regular blood tests to monitor the levels of your medicine.  SEEK MEDICAL CARE IF:   You develop signs of infection or other illness. This might increase the risk of a seizure.   You seem to be having more frequent seizures.   Your seizure pattern is changing.  SEEK IMMEDIATE MEDICAL CARE IF:   You have a seizure that does not stop after a few moments.   You have a seizure that causes any difficulty in breathing.   You have a seizure that results in a very severe headache.   You have a seizure that leaves you with the inability to speak or use a part of your  body.  Document Released: 01/07/2005 Document Revised: 10/28/2012 Document Reviewed: 08/19/2012 [facility] Patient Information 2015 [place], [place]. This information is not intended to replace advice given to you by your health care provider. Make sure you discuss any questions you have with your health care provider.

## 2014-07-15 LAB — PHENYTOIN LEVEL, FREE AND TOTAL
Phenytoin, Free: NOT DETECTED ug/mL (ref 1.0–2.0)
Phenytoin, Total: <0.6 ug/mL — ABNORMAL LOW (ref 10.0–20.0)

## 2014-07-22 ENCOUNTER — Emergency Department (HOSPITAL_COMMUNITY)
Admission: EM | Admit: 2014-07-22 | Discharge: 2014-07-22 | Disposition: A | Discharge status: Home / Self Care | Payer: Medicare Other | Attending: Emergency Medicine | Admitting: Emergency Medicine

## 2014-07-22 ENCOUNTER — Emergency Department (HOSPITAL_COMMUNITY): Payer: Medicare Other

## 2014-07-22 ENCOUNTER — Encounter (HOSPITAL_COMMUNITY): Payer: Self-pay | Admitting: *Deleted

## 2014-07-22 DIAGNOSIS — Z720 Tobacco use: Secondary | ICD-10-CM | POA: Diagnosis not present

## 2014-07-22 DIAGNOSIS — Y92009 Unspecified place in unspecified non-institutional (private) residence as the place of occurrence of the external cause: Secondary | ICD-10-CM | POA: Insufficient documentation

## 2014-07-22 DIAGNOSIS — J449 Chronic obstructive pulmonary disease, unspecified: Secondary | ICD-10-CM | POA: Diagnosis not present

## 2014-07-22 DIAGNOSIS — Z043 Encounter for examination and observation following other accident: Secondary | ICD-10-CM | POA: Diagnosis not present

## 2014-07-22 DIAGNOSIS — I10 Essential (primary) hypertension: Secondary | ICD-10-CM | POA: Diagnosis not present

## 2014-07-22 DIAGNOSIS — G40909 Epilepsy, unspecified, not intractable, without status epilepticus: Secondary | ICD-10-CM | POA: Insufficient documentation

## 2014-07-22 DIAGNOSIS — Z8673 Personal history of transient ischemic attack (TIA), and cerebral infarction without residual deficits: Secondary | ICD-10-CM | POA: Diagnosis not present

## 2014-07-22 DIAGNOSIS — Z8719 Personal history of other diseases of the digestive system: Secondary | ICD-10-CM | POA: Insufficient documentation

## 2014-07-22 DIAGNOSIS — Z79899 Other long term (current) drug therapy: Secondary | ICD-10-CM | POA: Insufficient documentation

## 2014-07-22 DIAGNOSIS — Z8701 Personal history of pneumonia (recurrent): Secondary | ICD-10-CM | POA: Insufficient documentation

## 2014-07-22 DIAGNOSIS — R410 Disorientation, unspecified: Secondary | ICD-10-CM | POA: Diagnosis not present

## 2014-07-22 DIAGNOSIS — Z85118 Personal history of other malignant neoplasm of bronchus and lung: Secondary | ICD-10-CM | POA: Insufficient documentation

## 2014-07-22 DIAGNOSIS — Y939 Activity, unspecified: Secondary | ICD-10-CM | POA: Insufficient documentation

## 2014-07-22 DIAGNOSIS — H9190 Unspecified hearing loss, unspecified ear: Secondary | ICD-10-CM | POA: Diagnosis not present

## 2014-07-22 DIAGNOSIS — Y999 Unspecified external cause status: Secondary | ICD-10-CM | POA: Insufficient documentation

## 2014-07-22 DIAGNOSIS — Z7901 Long term (current) use of anticoagulants: Secondary | ICD-10-CM | POA: Insufficient documentation

## 2014-07-22 DIAGNOSIS — W1839XA Other fall on same level, initial encounter: Secondary | ICD-10-CM | POA: Insufficient documentation

## 2014-07-22 DIAGNOSIS — R4182 Altered mental status, unspecified: Secondary | ICD-10-CM | POA: Diagnosis present

## 2014-07-22 DIAGNOSIS — W19XXXA Unspecified fall, initial encounter: Secondary | ICD-10-CM

## 2014-07-22 DIAGNOSIS — Z7952 Long term (current) use of systemic steroids: Secondary | ICD-10-CM | POA: Insufficient documentation

## 2014-07-22 LAB — CBC WITH DIFFERENTIAL/PLATELET
Basophils Absolute: 0 10*3/uL (ref 0.0–0.1)
Basophils Relative: 0 % (ref 0–1)
Eosinophils Absolute: 0.1 10*3/uL (ref 0.0–0.7)
Eosinophils Relative: 1 % (ref 0–5)
HEMATOCRIT: 40.1 % (ref 39.0–52.0)
HEMOGLOBIN: 13.4 g/dL (ref 13.0–17.0)
LYMPHS ABS: 0.8 10*3/uL (ref 0.7–4.0)
LYMPHS PCT: 10 % — AB (ref 12–46)
MCH: 29.2 pg (ref 26.0–34.0)
MCHC: 33.4 g/dL (ref 30.0–36.0)
MCV: 87.4 fL (ref 78.0–100.0)
Monocytes Absolute: 0.9 10*3/uL (ref 0.1–1.0)
Monocytes Relative: 11 % (ref 3–12)
NEUTROS ABS: 6.2 10*3/uL (ref 1.7–7.7)
Neutrophils Relative %: 78 % — ABNORMAL HIGH (ref 43–77)
Platelets: 304 10*3/uL (ref 150–400)
RBC: 4.59 MIL/uL (ref 4.22–5.81)
RDW: 13.5 % (ref 11.5–15.5)
WBC: 8 10*3/uL (ref 4.0–10.5)

## 2014-07-22 LAB — BASIC METABOLIC PANEL
Anion gap: 9 (ref 5–15)
BUN: 13 mg/dL (ref 6–20)
CHLORIDE: 95 mmol/L — AB (ref 101–111)
CO2: 28 mmol/L (ref 22–32)
CREATININE: 0.67 mg/dL (ref 0.61–1.24)
Calcium: 8.1 mg/dL — ABNORMAL LOW (ref 8.9–10.3)
GFR calc Af Amer: >60 mL/min (ref >60)
Glucose, Bld: 91 mg/dL (ref 65–99)
POTASSIUM: 4.3 mmol/L (ref 3.5–5.1)
Sodium: 132 mmol/L — ABNORMAL LOW (ref 135–145)

## 2014-07-22 LAB — PROTIME-INR
INR: 1.28 (ref 0.00–1.49)
PROTHROMBIN TIME: 16.2 s — AB (ref 11.6–15.2)

## 2014-07-22 LAB — PHENYTOIN LEVEL, TOTAL: Phenytoin Lvl: 34.3 ug/mL (ref 10.0–20.0)

## 2014-07-22 NOTE — ED Notes (Signed)
Bed: CB63 Expected date:  Expected time:  Means of arrival:  Comments: EMS-dilantin OD

## 2014-07-22 NOTE — ED Notes (Signed)
Pt's girlfriend at bedside, pt requesting to go home, pt and girlfriend educated and encouraged to use pt's walker that he has at home to help prevent falls. Pt does not want to wait for discharge papers and is ready to leave right now. Pt assisted to vehicle by this RN via wheelchair. Pt A&O on upon discharge.

## 2014-07-22 NOTE — ED Notes (Addendum)
Main lab called to report that for a phenytoin level they need a red top to be collected. Phlebotomy made aware.

## 2014-07-22 NOTE — ED Notes (Signed)
Patient transported to CT 

## 2014-07-22 NOTE — ED Notes (Signed)
Per [facility] - pt from home, per pt's girlfriend, pt began experiencing confusion and slurred speech approx 11:00am today, pt w/ head injury on Thursday from a fall. Pt admitted he may have taken an extra does dilantin today - per EMS pt disoriented to time, slurred speech appears to have resolved, no focal weakness noted however pt states "I am drunk but I don't drink alcohol" - for this RN pt is A&Ox4 however unable to provide current year. Pt denies pain.

## 2014-07-22 NOTE — ED Provider Notes (Signed)
CSN: [id number]     Arrival date & time 07/22/14  1921 History   First MD Initiated Contact with Patient 07/22/14 1928     Chief Complaint  Patient presents with  . Altered Mental Status      HPI  Patient presents for evaluation from home. He states that his girlfriend called. Since she was concerned that he was confused this morning. He seemed to be "a lot better" as the day has gone by. Was seen 1 week ago after a fall. Had a Dilantin level of 19. Was contacted and held his Dilantin for 24 hours per his family. Seen by myself on that day. I recommended to the family the patient stopped his Coumadin. He states that he continues to take it. Family was able to confirm that he has not. Agent states that he is taking his Dilantin twice per day and started doing that 2 days ago. He states that he thought he reports someone telling him to do this.  Patient has not fallen or had seizure today. He arrives here awake alert oriented lucid. Very hard of hearing.     Past Medical History  Diagnosis Date  . Hypertension   . COPD (chronic obstructive pulmonary disease)   . Hemorrhoids     external  . 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 GERD (gastroesophageal reflux disease)   . Emphysema   . HOH (hard of hearing)   . Pneumonia   . Seizures   . Headache(784.0)   . Non-small cell carcinoma of lung dx'd 2006    stage 3  . Lung cancer   . Stroke    Past Surgical History  Procedure Laterality Date  . Inner ear surgery    . Toe amputation Left   . Tonsillectomy    . Amputation Left 04/16/2013    Procedure: AMPUTATION RAY;  Surgeon: Newt Minion, MD;  Location: [place];  Service: Orthopedics;  Laterality: Left;  Left Foot 4th and 5th Ray Amputation  . 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 Heart failure Brother    History  Substance Use Topics  . Smoking status: Current Every Day Smoker -- 1.50 packs/day for 60 years    Types: Cigarettes  . Smokeless tobacco: Never Used  . Alcohol Use: No    Review of Systems  Constitutional: Negative for fever, chills, diaphoresis, appetite change and fatigue.  HENT: Negative for mouth sores, sore throat and trouble swallowing.   Eyes: Negative for visual disturbance.  Respiratory: Negative for cough, chest tightness, shortness of breath and wheezing.   Cardiovascular: Negative for chest pain.  Gastrointestinal: Negative for nausea, vomiting, abdominal pain, diarrhea and abdominal distention.  Endocrine: Negative for polydipsia, polyphagia and polyuria.  Genitourinary: Negative for dysuria, frequency and hematuria.  Musculoskeletal: Negative for gait problem.  Skin: Negative for color change, pallor and rash.  Neurological: Negative for dizziness, syncope, light-headedness and headaches.  Hematological: Does not bruise/bleed easily.  Psychiatric/Behavioral: Positive for confusion. Negative for behavioral problems.      Allergies  Review of patient's allergies indicates no known allergies.  Home Medications   Prior to Admission medications   Medication Sig Start Date End Date Taking? Authorizing Provider  amoxicillin-clavulanate (AUGMENTIN) 875-125 MG per  tablet TK 1 T PO  Q 12 HOURS FOR 10 DAYS 07/06/14   Historical Provider, MD  doxycycline (VIBRAMYCIN) 100 MG capsule Take 1 capsule (100 mg total) by mouth 2 (two) times daily. 07/13/14   Tanna Furry, MD  Fluticasone-Salmeterol (ADVAIR) 250-50 MCG/DOSE AEPB Inhale 1 puff into the lungs 2 (two) times daily.    Historical Provider, MD  gabapentin (NEURONTIN) 300 MG capsule Take 300 mg by mouth every evening.    Historical Provider, MD  hydrocortisone 2.5 % cream Apply 1 application topically daily.  09/03/13   Historical Provider, MD  ipratropium-albuterol (DUONEB) 0.5-2.5  (3) MG/3ML SOLN Inhale 3 mL by nebulization every six (6) hours as needed. For shortness of breath/wheezing 07/06/14 07/06/15  Historical Provider, MD  phenytoin (DILANTIN) 100 MG ER capsule Take 300 mg by mouth daily.    Historical Provider, MD  predniSONE (DELTASONE) 20 MG tablet Take 1 tablet (20 mg total) by mouth 2 (two) times daily with a meal. 07/13/14   Tanna Furry, MD  warfarin (COUMADIN) 5 MG tablet Take 5 mg by mouth daily.    Historical Provider, MD   BP 113/49 mmHg  Pulse 70  Temp(Src) 98.2 F (36.8 C) (Oral)  Resp 22  SpO2 93% Physical Exam  Constitutional: He is oriented to person, place, and time. He appears well-developed and well-nourished. No distress.  HENT:  Head: Normocephalic.  Eyes: Conjunctivae are normal. Pupils are equal, round, and reactive to light. No scleral icterus.  Neck: Normal range of motion. Neck supple. No thyromegaly present.  Cardiovascular: Normal rate and regular rhythm.  Exam reveals no gallop and no friction rub.   No murmur heard. Pulmonary/Chest: Effort normal and breath sounds normal. No respiratory distress. He has no wheezes. He has no rales.  Abdominal: Soft. Bowel sounds are normal. He exhibits no distension. There is no tenderness. There is no rebound.  Musculoskeletal: Normal range of motion.  Neurological: He is alert and oriented to person, place, and time.  Patient is a ventilatory. He has a broad-based gait. He is able to ambulance back in forth to the restroom simply with his hand on one of our techs arms.  Skin: Skin is warm and dry. No rash noted.  Psychiatric: He has a normal mood and affect. His behavior is normal.    ED Course  Procedures (including critical care time) Labs Review Labs Reviewed  CBC WITH DIFFERENTIAL/PLATELET - Abnormal; Notable for the following:    Neutrophils Relative % 78 (*)    Lymphocytes Relative 10 (*)    All other components within normal limits  PROTIME-INR - Abnormal; Notable for the following:     Prothrombin Time 16.2 (*)    All other components within normal limits  BASIC METABOLIC PANEL - Abnormal; Notable for the following:    Sodium 132 (*)    Chloride 95 (*)    Calcium 8.1 (*)    All other components within normal limits  PHENYTOIN LEVEL, TOTAL - Abnormal; Notable for the following:    Phenytoin Lvl 34.3 (*)    All other components within normal limits    Imaging Review No results found.   EKG Interpretation None      MDM   Final diagnoses:  Fall    Patient remained calm. However, he refuses to stay. He is oriented and lucid. He is not altered. He knows his name and his location. He was a day off on the date. I do not have reason or eye and  continue to treat him. I asked the patient to stay for the results of his Dilantin level. As did his friend Olin Hauser. He would not. He left with Olin Hauser. She asked me about "how I get him into a nursing facility". I told her that in no uncertain terms with his current mental status unless he wanted to go that she could not force him to go to his nose nursing facility. He lives at a "[facility] home". She states that there was a Education officer, museum that as she would speak with on Tuesday after the holiday. Olin Hauser with his permission 30 minutes after the patient left the emergency room with an elevated Dilantin level. I have asked her to hold his Dilantin for 48 hours and then restarted at a more appropriate 300 mg per day dose and return if any worsening symptoms. She states that he will be staying with her she can help look a fall precautions.    Tanna Furry, MD 07/24/14 [phone]

## 2014-09-13 ENCOUNTER — Other Ambulatory Visit: Payer: Self-pay | Admitting: Family

## 2014-09-13 DIAGNOSIS — Z85118 Personal history of other malignant neoplasm of bronchus and lung: Secondary | ICD-10-CM

## 2014-09-13 DIAGNOSIS — R634 Abnormal weight loss: Secondary | ICD-10-CM

## 2014-10-10 ENCOUNTER — Inpatient Hospital Stay (HOSPITAL_COMMUNITY): Payer: Medicare Other

## 2014-10-10 ENCOUNTER — Emergency Department (HOSPITAL_COMMUNITY): Payer: Medicare Other

## 2014-10-10 ENCOUNTER — Inpatient Hospital Stay (HOSPITAL_COMMUNITY)
Admission: EM | Admit: 2014-10-10 | Discharge: 2014-10-13 | DRG: 177 | Disposition: A | Discharge status: Home Health | Payer: Medicare Other | Attending: Internal Medicine | Admitting: Internal Medicine

## 2014-10-10 ENCOUNTER — Encounter (HOSPITAL_COMMUNITY): Payer: Self-pay

## 2014-10-10 DIAGNOSIS — I6340 Cerebral infarction due to embolism of unspecified cerebral artery: Secondary | ICD-10-CM | POA: Diagnosis not present

## 2014-10-10 DIAGNOSIS — R0602 Shortness of breath: Secondary | ICD-10-CM | POA: Diagnosis present

## 2014-10-10 DIAGNOSIS — J869 Pyothorax without fistula: Principal | ICD-10-CM | POA: Diagnosis present

## 2014-10-10 DIAGNOSIS — J9601 Acute respiratory failure with hypoxia: Secondary | ICD-10-CM | POA: Diagnosis not present

## 2014-10-10 DIAGNOSIS — Z923 Personal history of irradiation: Secondary | ICD-10-CM | POA: Diagnosis not present

## 2014-10-10 DIAGNOSIS — Z8249 Family history of ischemic heart disease and other diseases of the circulatory system: Secondary | ICD-10-CM

## 2014-10-10 DIAGNOSIS — J189 Pneumonia, unspecified organism: Secondary | ICD-10-CM | POA: Diagnosis present

## 2014-10-10 DIAGNOSIS — F1021 Alcohol dependence, in remission: Secondary | ICD-10-CM

## 2014-10-10 DIAGNOSIS — R569 Unspecified convulsions: Secondary | ICD-10-CM | POA: Diagnosis present

## 2014-10-10 DIAGNOSIS — F17210 Nicotine dependence, cigarettes, uncomplicated: Secondary | ICD-10-CM | POA: Diagnosis not present

## 2014-10-10 DIAGNOSIS — W19XXXA Unspecified fall, initial encounter: Secondary | ICD-10-CM | POA: Diagnosis not present

## 2014-10-10 DIAGNOSIS — I63411 Cerebral infarction due to embolism of right middle cerebral artery: Secondary | ICD-10-CM | POA: Diagnosis not present

## 2014-10-10 DIAGNOSIS — E43 Unspecified severe protein-calorie malnutrition: Secondary | ICD-10-CM | POA: Diagnosis present

## 2014-10-10 DIAGNOSIS — I639 Cerebral infarction, unspecified: Secondary | ICD-10-CM | POA: Diagnosis not present

## 2014-10-10 DIAGNOSIS — I6789 Other cerebrovascular disease: Secondary | ICD-10-CM | POA: Diagnosis not present

## 2014-10-10 DIAGNOSIS — Z7901 Long term (current) use of anticoagulants: Secondary | ICD-10-CM

## 2014-10-10 DIAGNOSIS — H9190 Unspecified hearing loss, unspecified ear: Secondary | ICD-10-CM | POA: Diagnosis present

## 2014-10-10 DIAGNOSIS — Z86718 Personal history of other venous thrombosis and embolism: Secondary | ICD-10-CM | POA: Diagnosis not present

## 2014-10-10 DIAGNOSIS — C3400 Malignant neoplasm of unspecified main bronchus: Secondary | ICD-10-CM | POA: Diagnosis not present

## 2014-10-10 DIAGNOSIS — R4182 Altered mental status, unspecified: Secondary | ICD-10-CM | POA: Diagnosis not present

## 2014-10-10 DIAGNOSIS — Z9221 Personal history of antineoplastic chemotherapy: Secondary | ICD-10-CM

## 2014-10-10 DIAGNOSIS — R531 Weakness: Secondary | ICD-10-CM | POA: Diagnosis not present

## 2014-10-10 DIAGNOSIS — I2510 Atherosclerotic heart disease of native coronary artery without angina pectoris: Secondary | ICD-10-CM | POA: Diagnosis present

## 2014-10-10 DIAGNOSIS — C3490 Malignant neoplasm of unspecified part of unspecified bronchus or lung: Secondary | ICD-10-CM | POA: Diagnosis not present

## 2014-10-10 DIAGNOSIS — C3431 Malignant neoplasm of lower lobe, right bronchus or lung: Secondary | ICD-10-CM | POA: Diagnosis not present

## 2014-10-10 DIAGNOSIS — E860 Dehydration: Secondary | ICD-10-CM | POA: Diagnosis present

## 2014-10-10 DIAGNOSIS — Z86711 Personal history of pulmonary embolism: Secondary | ICD-10-CM | POA: Diagnosis not present

## 2014-10-10 DIAGNOSIS — Z720 Tobacco use: Secondary | ICD-10-CM | POA: Diagnosis not present

## 2014-10-10 DIAGNOSIS — R627 Adult failure to thrive: Secondary | ICD-10-CM | POA: Diagnosis present

## 2014-10-10 DIAGNOSIS — Z681 Body mass index (BMI) 19 or less, adult: Secondary | ICD-10-CM | POA: Diagnosis not present

## 2014-10-10 DIAGNOSIS — Z8673 Personal history of transient ischemic attack (TIA), and cerebral infarction without residual deficits: Secondary | ICD-10-CM | POA: Diagnosis not present

## 2014-10-10 DIAGNOSIS — I63231 Cerebral infarction due to unspecified occlusion or stenosis of right carotid arteries: Secondary | ICD-10-CM | POA: Diagnosis not present

## 2014-10-10 DIAGNOSIS — E871 Hypo-osmolality and hyponatremia: Secondary | ICD-10-CM | POA: Diagnosis present

## 2014-10-10 DIAGNOSIS — K219 Gastro-esophageal reflux disease without esophagitis: Secondary | ICD-10-CM | POA: Diagnosis present

## 2014-10-10 DIAGNOSIS — Z515 Encounter for palliative care: Secondary | ICD-10-CM | POA: Diagnosis not present

## 2014-10-10 DIAGNOSIS — R1310 Dysphagia, unspecified: Secondary | ICD-10-CM | POA: Diagnosis present

## 2014-10-10 DIAGNOSIS — J984 Other disorders of lung: Secondary | ICD-10-CM | POA: Diagnosis not present

## 2014-10-10 DIAGNOSIS — I10 Essential (primary) hypertension: Secondary | ICD-10-CM | POA: Diagnosis present

## 2014-10-10 DIAGNOSIS — Z79899 Other long term (current) drug therapy: Secondary | ICD-10-CM | POA: Diagnosis not present

## 2014-10-10 DIAGNOSIS — G40209 Localization-related (focal) (partial) symptomatic epilepsy and epileptic syndromes with complex partial seizures, not intractable, without status epilepticus: Secondary | ICD-10-CM | POA: Diagnosis not present

## 2014-10-10 DIAGNOSIS — J449 Chronic obstructive pulmonary disease, unspecified: Secondary | ICD-10-CM | POA: Diagnosis present

## 2014-10-10 DIAGNOSIS — F17200 Nicotine dependence, unspecified, uncomplicated: Secondary | ICD-10-CM | POA: Diagnosis present

## 2014-10-10 LAB — BASIC METABOLIC PANEL
Anion gap: 9 (ref 5–15)
BUN: 11 mg/dL (ref 6–20)
CALCIUM: 9 mg/dL (ref 8.9–10.3)
CO2: 28 mmol/L (ref 22–32)
CREATININE: 0.48 mg/dL — AB (ref 0.61–1.24)
Chloride: 92 mmol/L — ABNORMAL LOW (ref 101–111)
GFR calc non Af Amer: >60 mL/min (ref >60)
Glucose, Bld: 127 mg/dL — ABNORMAL HIGH (ref 65–99)
Potassium: 4.4 mmol/L (ref 3.5–5.1)
SODIUM: 129 mmol/L — AB (ref 135–145)

## 2014-10-10 LAB — URINALYSIS, DIPSTICK ONLY
GLUCOSE, UA: NEGATIVE mg/dL
KETONES UR: NEGATIVE mg/dL
NITRITE: NEGATIVE
Protein, ur: NEGATIVE mg/dL
Specific Gravity, Urine: 1.02 (ref 1.005–1.030)
Urobilinogen, UA: 2 mg/dL — ABNORMAL HIGH (ref 0.0–1.0)
pH: 6.5 (ref 5.0–8.0)

## 2014-10-10 LAB — URINALYSIS, ROUTINE W REFLEX MICROSCOPIC
GLUCOSE, UA: NEGATIVE mg/dL
Ketones, ur: NEGATIVE mg/dL
Nitrite: NEGATIVE
PH: 6.5 (ref 5.0–8.0)
Protein, ur: NEGATIVE mg/dL
SPECIFIC GRAVITY, URINE: 1.019 (ref 1.005–1.030)
Urobilinogen, UA: 2 mg/dL — ABNORMAL HIGH (ref 0.0–1.0)

## 2014-10-10 LAB — PHENYTOIN LEVEL, TOTAL

## 2014-10-10 LAB — CBC
HCT: 38.9 % — ABNORMAL LOW (ref 39.0–52.0)
Hemoglobin: 12.7 g/dL — ABNORMAL LOW (ref 13.0–17.0)
MCH: 26.8 pg (ref 26.0–34.0)
MCHC: 32.6 g/dL (ref 30.0–36.0)
MCV: 82.1 fL (ref 78.0–100.0)
PLATELETS: 414 10*3/uL — AB (ref 150–400)
RBC: 4.74 MIL/uL (ref 4.22–5.81)
RDW: 14.3 % (ref 11.5–15.5)
WBC: 9.9 10*3/uL (ref 4.0–10.5)

## 2014-10-10 LAB — URINE MICROSCOPIC-ADD ON

## 2014-10-10 LAB — TROPONIN I: Troponin I: <0.03 ng/mL (ref <0.031)

## 2014-10-10 LAB — PROTIME-INR
INR: 1.03 (ref 0.00–1.49)
PROTHROMBIN TIME: 13.7 s (ref 11.6–15.2)

## 2014-10-10 LAB — TSH: TSH: 1.322 u[IU]/mL (ref 0.350–4.500)

## 2014-10-10 MED ORDER — ONDANSETRON HCL 4 MG/2ML IJ SOLN: 4.0000 mg | Freq: Four times a day (QID) | INTRAMUSCULAR | Status: DC | PRN

## 2014-10-10 MED ORDER — SODIUM CHLORIDE 0.9 % IV SOLN: 250.0000 mL | INTRAVENOUS | Status: DC | PRN

## 2014-10-10 MED ORDER — OXYCODONE HCL 5 MG PO TABS
5.0000 mg | ORAL_TABLET | ORAL | Status: DC | PRN
  Administered 2014-10-10: 5 mg via ORAL
  Filled 2014-10-10: qty 1

## 2014-10-10 MED ORDER — HALOPERIDOL LACTATE 5 MG/ML IJ SOLN
1.0000 mg | Freq: Four times a day (QID) | INTRAMUSCULAR | Status: DC | PRN
  Administered 2014-10-10 – 2014-10-13 (×8): 2 mg via INTRAVENOUS
  Filled 2014-10-10 (×8): qty 1

## 2014-10-10 MED ORDER — ENOXAPARIN SODIUM 40 MG/0.4ML ~~LOC~~ SOLN
40.0000 mg | SUBCUTANEOUS | Status: DC
  Administered 2014-10-10 – 2014-10-12 (×3): 40 mg via SUBCUTANEOUS
  Filled 2014-10-10 (×4): qty 0.4

## 2014-10-10 MED ORDER — ALUM & MAG HYDROXIDE-SIMETH 200-200-20 MG/5ML PO SUSP: 30.0000 mL | Freq: Four times a day (QID) | ORAL | Status: DC | PRN

## 2014-10-10 MED ORDER — CHLORHEXIDINE GLUCONATE 0.12 % MT SOLN
15.0000 mL | Freq: Two times a day (BID) | OROMUCOSAL | Status: DC
  Administered 2014-10-11 – 2014-10-13 (×3): 15 mL via OROMUCOSAL
  Filled 2014-10-10 (×7): qty 15

## 2014-10-10 MED ORDER — WARFARIN - PHARMACIST DOSING INPATIENT: Freq: Every day | Status: DC

## 2014-10-10 MED ORDER — IPRATROPIUM-ALBUTEROL 0.5-2.5 (3) MG/3ML IN SOLN
3.0000 mL | RESPIRATORY_TRACT | Status: DC
  Administered 2014-10-10 (×2): 3 mL via RESPIRATORY_TRACT
  Filled 2014-10-10 (×2): qty 3

## 2014-10-10 MED ORDER — SODIUM CHLORIDE 0.9 % IJ SOLN
3.0000 mL | Freq: Two times a day (BID) | INTRAMUSCULAR | Status: DC
  Administered 2014-10-12: 3 mL via INTRAVENOUS

## 2014-10-10 MED ORDER — SODIUM CHLORIDE 0.9 % IJ SOLN: 3.0000 mL | INTRAMUSCULAR | Status: DC | PRN

## 2014-10-10 MED ORDER — ONDANSETRON HCL 4 MG PO TABS: 4.0000 mg | ORAL_TABLET | Freq: Four times a day (QID) | ORAL | Status: DC | PRN

## 2014-10-10 MED ORDER — DEXTROSE 5 % IV SOLN
500.0000 mg | INTRAVENOUS | Status: DC
  Filled 2014-10-10: qty 500

## 2014-10-10 MED ORDER — ACETAMINOPHEN 325 MG PO TABS
650.0000 mg | ORAL_TABLET | Freq: Four times a day (QID) | ORAL | Status: DC | PRN
  Filled 2014-10-10: qty 2

## 2014-10-10 MED ORDER — IOHEXOL 300 MG/ML  SOLN
75.0000 mL | Freq: Once | INTRAMUSCULAR | Status: AC | PRN
  Administered 2014-10-10: 75 mL via INTRAVENOUS

## 2014-10-10 MED ORDER — MOMETASONE FURO-FORMOTEROL FUM 100-5 MCG/ACT IN AERO
2.0000 | INHALATION_SPRAY | Freq: Two times a day (BID) | RESPIRATORY_TRACT | Status: DC
  Administered 2014-10-11 – 2014-10-13 (×3): 2 via RESPIRATORY_TRACT
  Filled 2014-10-10 (×2): qty 8.8

## 2014-10-10 MED ORDER — DEXTROSE 5 % IV SOLN
500.0000 mg | Freq: Once | INTRAVENOUS | Status: DC
  Administered 2014-10-10: 500 mg via INTRAVENOUS
  Filled 2014-10-10: qty 500

## 2014-10-10 MED ORDER — WARFARIN SODIUM 5 MG PO TABS
5.0000 mg | ORAL_TABLET | Freq: Once | ORAL | Status: AC
  Administered 2014-10-10: 5 mg via ORAL
  Filled 2014-10-10: qty 1

## 2014-10-10 MED ORDER — GABAPENTIN 300 MG PO CAPS
300.0000 mg | ORAL_CAPSULE | Freq: Every evening | ORAL | Status: DC
  Administered 2014-10-10 – 2014-10-12 (×3): 300 mg via ORAL
  Filled 2014-10-10 (×4): qty 1

## 2014-10-10 MED ORDER — DEXTROSE 5 % IV SOLN
1.0000 g | INTRAVENOUS | Status: DC
  Filled 2014-10-10: qty 10

## 2014-10-10 MED ORDER — PHENYTOIN SODIUM EXTENDED 100 MG PO CAPS
300.0000 mg | ORAL_CAPSULE | Freq: Every day | ORAL | Status: DC
  Administered 2014-10-10 – 2014-10-13 (×4): 300 mg via ORAL
  Filled 2014-10-10 (×4): qty 3

## 2014-10-10 MED ORDER — PREDNISONE 20 MG PO TABS
20.0000 mg | ORAL_TABLET | Freq: Two times a day (BID) | ORAL | Status: DC
  Administered 2014-10-10 – 2014-10-13 (×6): 20 mg via ORAL
  Filled 2014-10-10 (×9): qty 1

## 2014-10-10 MED ORDER — DEXTROSE 5 % IV SOLN
1.0000 g | Freq: Once | INTRAVENOUS | Status: AC
  Administered 2014-10-10: 1 g via INTRAVENOUS
  Filled 2014-10-10: qty 10

## 2014-10-10 MED ORDER — ACETAMINOPHEN 650 MG RE SUPP
650.0000 mg | Freq: Four times a day (QID) | RECTAL | Status: DC | PRN
Start: 2014-10-10 — End: 2014-10-13

## 2014-10-10 MED ORDER — IPRATROPIUM-ALBUTEROL 0.5-2.5 (3) MG/3ML IN SOLN
3.0000 mL | Freq: Four times a day (QID) | RESPIRATORY_TRACT | Status: DC
  Administered 2014-10-11 – 2014-10-13 (×6): 3 mL via RESPIRATORY_TRACT
  Filled 2014-10-10 (×8): qty 3

## 2014-10-10 MED ORDER — SODIUM CHLORIDE 0.9 % IV SOLN
INTRAVENOUS | Status: DC
  Administered 2014-10-10: 17:00:00 via INTRAVENOUS

## 2014-10-10 NOTE — ED Notes (Signed)
Awake. Verbally responsive. A/O x4. Resp even and unlabored. No audible adventitious breath sounds noted. ABC's intact. Continues to have occ productive cough.

## 2014-10-10 NOTE — ED Notes (Signed)
Patient transported to X-ray 

## 2014-10-10 NOTE — ED Notes (Addendum)
Pt reported occ congested productive cough. He does not know the color of sputum because he swallows. Auscultated diminish breath sounds bil and throughout mostly in RLL. Resp even and unlabored. Pt reported [facility] with exertion. Pt reported hematuria.

## 2014-10-10 NOTE — ED Notes (Signed)
Awake. Verbally responsive. A/O x4. Resp even and unlabored. Occ nonproductive congested cough. ABC's intact. SR on monitor. Family at bedside.

## 2014-10-10 NOTE — Progress Notes (Signed)
ANTICOAGULATION CONSULT NOTE - Initial Consult  Pharmacy Consult for warfarin Indication: hx of embolic CVA; hx of DVT, PE  No Known Allergies  Patient Measurements: Height: '5\' 11"'$  (180.3 cm) IBW/kg (Calculated) : 75.3   Vital Signs: Temp: 97.8 F (36.6 C) (09/19 0927) Temp Source: Oral (09/19 0927) BP: 119/55 mmHg (09/19 1430) Pulse Rate: 116 (09/19 1430)  Labs:  Recent Labs  10/10/14 0940 10/10/14 0953  HGB 12.7*  --   HCT 38.9*  --   PLT 414*  --   LABPROT  --  13.7  INR  --  1.03  CREATININE 0.48*  --   TROPONINI  --  <0.03    CrCl cannot be calculated (Unknown ideal weight.).   Medical History: Past Medical History  Diagnosis Date  . Hypertension   . COPD (chronic obstructive pulmonary disease)   . Hemorrhoids     external  . 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 GERD (gastroesophageal reflux disease)   . Emphysema   . HOH (hard of hearing)   . Pneumonia   . Seizures   . Headache(784.0)   . Non-small cell carcinoma of lung dx'd 2006    stage 3  . Lung cancer   . Stroke     Medications:  Scheduled:  . azithromycin  500 mg Intravenous Q24H  . cefTRIAXone (ROCEPHIN)  IV  1 g Intravenous Q24H  . enoxaparin (LOVENOX) injection  40 mg Subcutaneous Q24H  . gabapentin  300 mg Oral QPM  . ipratropium-albuterol  3 mL Nebulization Q4H  . mometasone-formoterol  2 puff Inhalation BID  . phenytoin  300 mg Oral Daily  . predniSONE  20 mg Oral BID WC  . sodium chloride  3 mL Intravenous Q12H  . warfarin  5 mg Oral ONCE-1800  . Warfarin - Pharmacist Dosing Inpatient   Does not apply q1800   Infusions:  . sodium chloride     PRN: sodium chloride, acetaminophen **OR** acetaminophen, alum & mag hydroxide-simeth, ondansetron **OR** ondansetron (ZOFRAN) IV, oxyCODONE, sodium chloride   Diet: Heart healthy  Assessment: 81 y/o M with hx of Stage IIIa NSCLC, DVT, PE, embolic CVA, seizure, admitted 9/19 with CAP and FTT.  Per medical records  patient was previously on lifelong anticoagulation with warfarin.  INR subtherapeutic (1.03) on admission and patient stated to MD that he was instructed to discontinue warfarin about a month ago, although we do not have a record of this.  Due to high risk for CVA and VTE, attending MD has resumed warfarin and requested pharmacy dosing assistance.    According to available records patient's warfarin dosage varied between 2.5 mg daily and 5 mg daily.     Patient reports significant weight loss over past several months  Drug interactions:    Ceftriaxone and azithromycin for CAP. Broad spectrum antibiotics may disrupt vitamin-K producing bowel flora resulting in increased INR response to warfarin.  Phenytoin for hx seizure.  May lead to more rapid warfarin metabolism and decrease INR response  Prednisone for COPD,. May increase INR response to warfarin.  Currently on prophylactic-dose Lovenox (40 mg SQ q24h) per MD orders while INR subtherapeutic.  Goal of Therapy:  INR 2-3    Plan:  1. Warfarin 5 mg PO x 1 today at 6 pm. 2. Follow PT/INR daily while inpatient. 3. When INR 2.0 or above, discontinue prophylactic-dose SQ Lovenox. 4. Monitor for evidence of bleeding, follow clinical course.  Clayburn Pert, PharmD, BCPS Pager:  [phone] 10/10/2014  2:57 PM

## 2014-10-10 NOTE — ED Provider Notes (Signed)
CSN: [id number]     Arrival date & time 10/10/14  3716 History   First MD Initiated Contact with Patient 10/10/14 1000     Chief Complaint  Patient presents with  . Shortness of Breath  . Hematuria     (Consider location/radiation/quality/duration/timing/severity/associated sxs/prior Treatment) HPI Patient is vague historian. Patient complains of jaw pain worse with swallowing for the past several months. He's also had a 30 pound weight loss over proximal the past 3 months has also had cough for several months. He was seen by his primary care physician earlier today. Sent here for further evaluation no other associated complaint. Past Medical History  Diagnosis Date  . Hypertension   . COPD (chronic obstructive pulmonary disease)   . Hemorrhoids     external  . 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 GERD (gastroesophageal reflux disease)   . Emphysema   . HOH (hard of hearing)   . Pneumonia   . Seizures   . Headache(784.0)   . Non-small cell carcinoma of lung dx'd 2006    stage 3  . Lung cancer   . Stroke    Past Surgical History  Procedure Laterality Date  . Inner ear surgery    . Toe amputation Left   . Tonsillectomy    . Amputation Left 04/16/2013    Procedure: AMPUTATION RAY;  Surgeon: Newt Minion, MD;  Location: [place];  Service: Orthopedics;  Laterality: Left;  Left Foot 4th and 5th Ray Amputation  . Lipoma excision Left 09/08/2013    Procedure: EXCISION OF LEFT PINNA SKIN CANCER WITH FROZEN SECTION ;  Surgeon: Izora Gala, MD;  Location: [facility] OR;  Service: ENT;  Laterality: Left;   Family History  Problem Relation Age of Onset  . Heart failure Brother    Social History  Substance Use Topics  . Smoking status: Current Every Day Smoker -- 1.50 packs/day for 60 years    Types: Cigarettes  . Smokeless tobacco: Never Used  . Alcohol Use: No    Review of Systems  Constitutional: Positive for unexpected weight change.  HENT: Positive for hearing  loss.        Pain in left jaw. Chronically hard of hearing  Respiratory: Positive for cough.   Genitourinary: Positive for hematuria.  Allergic/Immunologic: Positive for immunocompromised state.       History of lung cancer  All other systems reviewed and are negative.     Allergies  Review of patient's allergies indicates no known allergies.  Home Medications   Prior to Admission medications   Medication Sig Start Date End Date Taking? Authorizing Provider  amoxicillin-clavulanate (AUGMENTIN) 875-125 MG per tablet TK 1 T PO  Q 12 HOURS FOR 10 DAYS 07/06/14   Historical Provider, MD  doxycycline (VIBRAMYCIN) 100 MG capsule Take 1 capsule (100 mg total) by mouth 2 (two) times daily. 07/13/14   Tanna Furry, MD  Fluticasone-Salmeterol (ADVAIR DISKUS) 250-50 MCG/DOSE AEPB Inhale into the lungs. 08/29/14 11/27/14  Historical Provider, MD  Fluticasone-Salmeterol (ADVAIR) 250-50 MCG/DOSE AEPB Inhale 1 puff into the lungs 2 (two) times daily.    Historical Provider, MD  gabapentin (NEURONTIN) 300 MG capsule Take 300 mg by mouth every evening.    Historical Provider, MD  hydrocortisone 2.5 % cream Apply 1 application topically daily.  09/03/13   Historical Provider, MD  ipratropium-albuterol (DUONEB) 0.5-2.5 (3) MG/3ML SOLN Inhale 3 mL by nebulization every six (6) hours as needed. For shortness of breath/wheezing  07/06/14 07/06/15  Historical Provider, MD  phenytoin (DILANTIN) 100 MG ER capsule Take 300 mg by mouth daily.    Historical Provider, MD  phenytoin (DILANTIN) 100 MG ER capsule Take 300 mg by mouth. 08/29/14   Historical Provider, MD  predniSONE (DELTASONE) 20 MG tablet Take 1 tablet (20 mg total) by mouth 2 (two) times daily with a meal. 07/13/14   Tanna Furry, MD  PROAIR HFA 108 (90 BASE) MCG/ACT inhaler INHALE 2 PUFFS EVERY 4-6 HOURS AS NEEDED FOR COUGH OR WHEEZING 08/29/14   Historical Provider, MD  warfarin (COUMADIN) 5 MG tablet Take 5 mg by mouth daily.    Historical Provider, MD   BP 102/51  mmHg  Pulse 96  Temp(Src) 97.8 F (36.6 C) (Oral)  Resp 20  SpO2 97% Physical Exam  Constitutional: No distress.  Chronically ill-appearing  HENT:  Head: Normocephalic and atraumatic.  Eyes: Conjunctivae are normal. Pupils are equal, round, and reactive to light.  Neck: Neck supple. No tracheal deviation present. No thyromegaly present.  Cardiovascular: Normal rate and regular rhythm.   No murmur heard. Pulmonary/Chest: Effort normal and breath sounds normal.  Abdominal: Soft. Bowel sounds are normal. He exhibits no distension. There is no tenderness.  Musculoskeletal: Normal range of motion. He exhibits no edema or tenderness.  Neurological: He is alert. Coordination normal.  Skin: Skin is warm and dry. No rash noted.  Psychiatric: He has a normal mood and affect.  Nursing note and vitals reviewed.   ED Course  Procedures (including critical care time) Labs Review Labs Reviewed  CBC - Abnormal; Notable for the following:    Hemoglobin 12.7 (*)    HCT 38.9 (*)    Platelets 414 (*)    All other components within normal limits  URINALYSIS, ROUTINE W REFLEX MICROSCOPIC (NOT AT [facility])  BASIC METABOLIC PANEL    Imaging Review No results found. I have personally reviewed and evaluated these images and lab results as part of my medical decision-making.   EKG Interpretation   Date/Time:  Monday October 10 2014 09:41:22 EDT Ventricular Rate:  99 PR Interval:  156 QRS Duration: 125 QT Interval:  365 QTC Calculation: 468 R Axis:   83 Text Interpretation:  Sinus rhythm Multiform ventricular premature  complexes Right bundle branch block Baseline wander in lead(s) V3 V6 no  significant change since June 2016 Confirmed by Regenia Skeeter  MD, SCOTT ([id number])  on 10/10/2014 9:46:32 AM     Chest x-ray reviewed by me  12:10 PM patient resting comfortably. No distress Results for orders placed or performed during the hospital encounter of 32/35/57  Basic metabolic panel  Result  Value Ref Range   Sodium 129 (L) 135 - 145 mmol/L   Potassium 4.4 3.5 - 5.1 mmol/L   Chloride 92 (L) 101 - 111 mmol/L   CO2 28 22 - 32 mmol/L   Glucose, Bld 127 (H) 65 - 99 mg/dL   BUN 11 6 - 20 mg/dL   Creatinine, Ser 0.48 (L) 0.61 - 1.24 mg/dL   Calcium 9.0 8.9 - 10.3 mg/dL   GFR calc non Af Amer >60 >60 mL/min   GFR calc Af Amer >60 >60 mL/min   Anion gap 9 5 - 15  CBC  Result Value Ref Range   WBC 9.9 4.0 - 10.5 K/uL   RBC 4.74 4.22 - 5.81 MIL/uL   Hemoglobin 12.7 (L) 13.0 - 17.0 g/dL   HCT 38.9 (L) 39.0 - 52.0 %   MCV 82.1 78.0 -  100.0 fL   MCH 26.8 26.0 - 34.0 pg   MCHC 32.6 30.0 - 36.0 g/dL   RDW 14.3 11.5 - 15.5 %   Platelets 414 (H) 150 - 400 K/uL  Urinalysis, dipstick only  Result Value Ref Range   Color, Urine AMBER (A) YELLOW   APPearance CLOUDY (A) CLEAR   Specific Gravity, Urine 1.020 1.005 - 1.030   pH 6.5 5.0 - 8.0   Glucose, UA NEGATIVE NEGATIVE mg/dL   Hgb urine dipstick SMALL (A) NEGATIVE   Bilirubin Urine SMALL (A) NEGATIVE   Ketones, ur NEGATIVE NEGATIVE mg/dL   Protein, ur NEGATIVE NEGATIVE mg/dL   Urobilinogen, UA 2.0 (H) 0.0 - 1.0 mg/dL   Nitrite NEGATIVE NEGATIVE   Leukocytes, UA SMALL (A) NEGATIVE  Protime-INR  Result Value Ref Range   Prothrombin Time 13.7 11.6 - 15.2 seconds   INR 1.03 0.00 - 1.49  Troponin I  Result Value Ref Range   Troponin I <0.03 <0.031 ng/mL   Dg Chest 2 View  10/10/2014   CLINICAL DATA:  Shortness of breath, productive cough.  EXAM: CHEST  2 VIEW  COMPARISON:  07/13/2014  FINDINGS: Patchy airspace disease throughout the right lung, increasing since prior study. There is likely chronic underlying scarring, but findings are concerning for superimposed infiltrate/ pneumonia. Scarring noted in the left lung base. Possible early patchy opacity in the left upper lobe. Underlying COPD. Heart is borderline in size. No effusions.  IMPRESSION: Increasing patchy airspace disease throughout the right lung, likely pneumonia  superimposed on chronic scarring. New patchy opacity in the left upper lobe could reflect early pneumonia as well.  COPD.   Electronically Signed   By: Rolm Baptise M.D.   On: 10/10/2014 10:54    MDM  Spoke with Dr.Zamora plan admit med surgical floor intravenous antibiotics Final diagnoses:  None   Dx#1 communitt acquired pneumonia #2 hyponatremia #3 tobacco abuse    Orlie Dakin, MD 10/10/14 1218

## 2014-10-10 NOTE — ED Notes (Signed)
Awake. Verbally responsive. A/O x4. Resp even and unlabored. No audible adventitious breath sounds noted. ABC's intact. SR on monitor. Admitting MD at bedside.

## 2014-10-10 NOTE — ED Notes (Signed)
2x attempt on Blood Cultures - Unsuccessful. 1st attempt pt started coughing and needle came out, 2nd attempt - Flash, but nothing else. Rn Aware.

## 2014-10-10 NOTE — H&P (Addendum)
[facility] History and Physical  [name] UXL:[id number] DOB: 09/12/1930 DOA: 10/10/2014  Referring physician:  PCP: Junie Panning, NP   Chief Complaint: Cough/shortness of breath  HPI: Paul [name] is a 81 y.o. male with a past medical history of stage IIIa non-small cell lung cancer that was diagnosed in November 2005 with disease recurrence in February 2007, had been seen in the cancer center by Dr. Julien Nordmann undergoing chemoradiation therapy, history of pulmonary emboli, history of embolic stroke, on lifelong anticoagulation, chronic obstructive pulmonary disease ongoing tobacco abuse presenting to the emergency department with complaints of shortness of breath associate with [place]. He states having generalized weakness, minimal by mouth intake, difficulties getting around at home over the past 2 weeks. He also reports anorexia having significant weight loss over the past several months. He reports developing worsening shortness of breath associated with productive cough having yellowish sputum production over the past week. He denies fevers, chills, nausea, vomiting, chest pain, abdominal pain, dysuria or hematuria.                                                  Review of Systems:  Constitutional:  No weight loss, night sweats, Fevers, chills, positive for fatigue, malaise, anorexia.  HEENT:  No headaches, Difficulty swallowing,Tooth/dental problems,Sore throat,  No sneezing, itching, ear ache, nasal congestion, post nasal drip,  Cardio-vascular:  No chest pain, Orthopnea, PND, swelling in lower extremities, anasarca, dizziness, palpitations  GI:  No heartburn, indigestion, abdominal pain, nausea, vomiting, diarrhea, change in bowel habits, loss of appetite  Resp:  Positive for shortness of breath with exertion or at rest. No excess mucus, no productive cough, No non-productive cough, No coughing up of blood.No change in color of mucus.No wheezing.No chest wall  deformity  Skin:  no rash or lesions.  GU:  no dysuria, change in color of urine, no urgency or frequency. No flank pain.  Musculoskeletal:  No joint pain or swelling. No decreased range of motion. No back pain.  Psych:  No change in mood or affect. No depression or anxiety. No memory loss.   Past Medical History  Diagnosis Date  . Hypertension   . COPD (chronic obstructive pulmonary disease)   . Hemorrhoids     external  . 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 GERD (gastroesophageal reflux disease)   . Emphysema   . HOH (hard of hearing)   . Pneumonia   . Seizures   . Headache(784.0)   . Non-small cell carcinoma of lung dx'd 2006    stage 3  . Lung cancer   . Stroke    Past Surgical History  Procedure Laterality Date  . Inner ear surgery    . Toe amputation Left   . Tonsillectomy    . Amputation Left 04/16/2013    Procedure: AMPUTATION RAY;  Surgeon: Newt Minion, MD;  Location: [place];  Service: Orthopedics;  Laterality: Left;  Left Foot 4th and 5th Ray Amputation  . Lipoma excision Left 09/08/2013    Procedure: EXCISION OF LEFT PINNA SKIN CANCER WITH FROZEN SECTION ;  Surgeon: Izora Gala, MD;  Location: [place];  Service: ENT;  Laterality: Left;   Social History:  reports that he has been smoking Cigarettes.  He has a 90 pack-year smoking history. He has never  used smokeless tobacco. He reports that he does not drink alcohol or use illicit drugs.  No Known Allergies  Family History  Problem Relation Age of Onset  . Heart failure Brother      Prior to Admission medications   Medication Sig Start Date End Date Taking? Authorizing Provider  amoxicillin-clavulanate (AUGMENTIN) 875-125 MG per tablet TK 1 T PO  Q 12 HOURS FOR 10 DAYS 07/06/14   Historical Provider, MD  doxycycline (VIBRAMYCIN) 100 MG capsule Take 1 capsule (100 mg total) by mouth 2 (two) times daily. 07/13/14   Tanna Furry, MD  Fluticasone-Salmeterol (ADVAIR) 250-50 MCG/DOSE AEPB Inhale 1  puff into the lungs 2 (two) times daily.    Historical Provider, MD  gabapentin (NEURONTIN) 300 MG capsule Take 300 mg by mouth every evening.    Historical Provider, MD  hydrocortisone 2.5 % cream Apply 1 application topically daily.  09/03/13   Historical Provider, MD  ipratropium-albuterol (DUONEB) 0.5-2.5 (3) MG/3ML SOLN Inhale 3 mL by nebulization every six (6) hours as needed. For shortness of breath/wheezing 07/06/14 07/06/15  Historical Provider, MD  phenytoin (DILANTIN) 100 MG ER capsule Take 300 mg by mouth daily.    Historical Provider, MD  predniSONE (DELTASONE) 20 MG tablet Take 1 tablet (20 mg total) by mouth 2 (two) times daily with a meal. 07/13/14   Tanna Furry, MD  PROAIR HFA 108 (90 BASE) MCG/ACT inhaler INHALE 2 PUFFS EVERY 4-6 HOURS AS NEEDED FOR COUGH OR WHEEZING 08/29/14   Historical Provider, MD  warfarin (COUMADIN) 5 MG tablet Take 5 mg by mouth daily.    Historical Provider, MD   Physical Exam: Filed Vitals:   10/10/14 0086 10/10/14 0927 10/10/14 1000 10/10/14 1100  BP:  102/51  114/56  Pulse: 97 96  80  Temp:  97.8 F (36.6 C)    TempSrc: Oral Oral    Resp: '20 20  22  '$ SpO2: 95% 97% 96% 100%    Wt Readings from Last 3 Encounters:  04/04/14 61.871 kg (136 lb 6.4 oz)  09/08/13 58.968 kg (130 lb)  09/03/13 58.968 kg (130 lb)    General:  Patient is cachectic, disheveled, though no acute distress he is awake and alert. Poor historian Eyes: PERRL, normal lids, irises & conjunctiva ENT: grossly normal hearing, lips & tongue, dry oral mucosa Neck: no LAD, masses or thyromegaly Cardiovascular: RRR, no m/r/g. No LE edema. Telemetry: SR, no arrhythmias  Respiratory: Diminished breath sounds bilaterally, no wheezing rhonchi or crackles Abdomen: soft, ntnd Skin: no rash or induration seen on limited exam Musculoskeletal: grossly normal tone BUE/BLE, significant muscle atrophy Psychiatric: grossly normal mood and affect, speech fluent and appropriate Neurologic: grossly  non-focal.          Labs on Admission:  Basic Metabolic Panel:  Recent Labs Lab 10/10/14 0940  NA 129*  K 4.4  CL 92*  CO2 28  GLUCOSE 127*  BUN 11  CREATININE 0.48*  CALCIUM 9.0   Liver Function Tests: No results for input(s): AST, ALT, ALKPHOS, BILITOT, PROT, ALBUMIN in the last 168 hours. No results for input(s): LIPASE, AMYLASE in the last 168 hours. No results for input(s): AMMONIA in the last 168 hours. CBC:  Recent Labs Lab 10/10/14 0940  WBC 9.9  HGB 12.7*  HCT 38.9*  MCV 82.1  PLT 414*   Cardiac Enzymes:  Recent Labs Lab 10/10/14 0953  TROPONINI <0.03    BNP (last 3 results) No results for input(s): BNP in the last 8760 hours.  ProBNP (last 3 results) No results for input(s): PROBNP in the last 8760 hours.  CBG: No results for input(s): GLUCAP in the last 168 hours.  Radiological Exams on Admission: Dg Chest 2 View  10/10/2014   CLINICAL DATA:  Shortness of breath, productive cough.  EXAM: CHEST  2 VIEW  COMPARISON:  07/13/2014  FINDINGS: Patchy airspace disease throughout the right lung, increasing since prior study. There is likely chronic underlying scarring, but findings are concerning for superimposed infiltrate/ pneumonia. Scarring noted in the left lung base. Possible early patchy opacity in the left upper lobe. Underlying COPD. Heart is borderline in size. No effusions.  IMPRESSION: Increasing patchy airspace disease throughout the right lung, likely pneumonia superimposed on chronic scarring. New patchy opacity in the left upper lobe could reflect early pneumonia as well.  COPD.   Electronically Signed   By: Rolm Baptise M.D.   On: 10/10/2014 10:54    EKG: Independently reviewed.   Assessment/Plan Principal Problem:   CAP (community acquired pneumonia) Active Problems:   Shortness of breath   ALCOHOL ABUSE, HX OF   FTT (failure to thrive) in adult   Community acquired pneumonia   1. Community-acquired pneumonia. Patient presenting  with a 2 week history of generalized weakness, malaise, cough associate with shortness of breath. A chest x-ray on admission showed patchy airspace disease throughout the right lung as well as opacity in the left upper lobe having appearance of pneumonia. Will treat for community acquired pneumonia with empiric IV antibiotic therapy with ceftriaxone and azithromycin. Admit to telemetry, provide supportive care, repeat chest x-ray in a.m. 2. Failure to thrive. Patient reporting significant weight loss over the past several months becoming increasingly weak having poor tolerance to physical exertion in the last several weeks. Although pneumonia could explain malaise and weakness in the last 2 weeks I am concerned about significant weight loss in the last several months particularly having a history of cancer and the possibility of disease recurrence. Looking back at his records it appears his last CT scan of lungs was performed on 12/25/2012. Will consult physical therapy 3. Lifelong anticoagulation. Patient having a history of DVT, pulmonary embolism, probable embolic stroke. Per medical records he is on lifelong anticoagulation. Patient reporting that he was instructed to discontinue anticoagulation about a month ago although I do not have record of this. His INR was subtherapeutic at 1.03 with INR 13.7. Given his high risk for stroke and thromboembolism, will restart Coumadin and consult pharmacy for Coumadin dosing.  4. Hyponatremia. Lab work showing a sodium of 129, I suspect secondary to dehydration. Will provide gentle IV fluid hydration overnight. 5. History of stage IIIa non-small cell lung cancer, status post concurrent chemoradiation therapy, who was previously seen in the [place]. He reports having significant weight loss over the past several months as disease recurrence is a concern. Will obtain a CT scan of lungs with IV contrast at this time. 6. Chronic obstructive pulmonary disease. He did  not have significant wheezing on exam, will provide duo nebs every 4 hours 7. Ongoing tobacco abuse. Patient was counseled 8. History of seizure. Patient on Dilantin, will check Dilantin level.    Code Status: Full code Family Communication:  Disposition Plan: Will admit patient to the inpatient service, anticipate require greater than 2 nights hospitalization  Time spent: 70 min  Kelvin Cellar [facility] Pager [phone]

## 2014-10-10 NOTE — ED Notes (Signed)
Per friend, pt went to MD today.  Pt was found to have 87% oxygen sats.  Pt also found to have blood in urine.  Pt is pt at [facility] for lung cancer.  ? Whether lung cancer has returned. [facility] physician sent him here. Pt was told he needed CT and urology consult.  Pt 94% on ra at arrival.  Pt does not wear oxygen at home.

## 2014-10-11 ENCOUNTER — Inpatient Hospital Stay (HOSPITAL_COMMUNITY): Payer: Medicare Other

## 2014-10-11 DIAGNOSIS — C3490 Malignant neoplasm of unspecified part of unspecified bronchus or lung: Secondary | ICD-10-CM | POA: Diagnosis present

## 2014-10-11 DIAGNOSIS — J189 Pneumonia, unspecified organism: Secondary | ICD-10-CM

## 2014-10-11 DIAGNOSIS — J869 Pyothorax without fistula: Principal | ICD-10-CM

## 2014-10-11 DIAGNOSIS — R0602 Shortness of breath: Secondary | ICD-10-CM

## 2014-10-11 DIAGNOSIS — C3400 Malignant neoplasm of unspecified main bronchus: Secondary | ICD-10-CM

## 2014-10-11 LAB — CBC
HCT: 35.7 % — ABNORMAL LOW (ref 39.0–52.0)
HEMOGLOBIN: 11.4 g/dL — AB (ref 13.0–17.0)
MCH: 26.5 pg (ref 26.0–34.0)
MCHC: 31.9 g/dL (ref 30.0–36.0)
MCV: 83 fL (ref 78.0–100.0)
PLATELETS: 379 10*3/uL (ref 150–400)
RBC: 4.3 MIL/uL (ref 4.22–5.81)
RDW: 14.4 % (ref 11.5–15.5)
WBC: 9 10*3/uL (ref 4.0–10.5)

## 2014-10-11 LAB — BASIC METABOLIC PANEL
ANION GAP: 8 (ref 5–15)
BUN: 10 mg/dL (ref 6–20)
CALCIUM: 8.2 mg/dL — AB (ref 8.9–10.3)
CO2: 27 mmol/L (ref 22–32)
Chloride: 96 mmol/L — ABNORMAL LOW (ref 101–111)
Creatinine, Ser: 0.41 mg/dL — ABNORMAL LOW (ref 0.61–1.24)
GFR calc Af Amer: >60 mL/min (ref >60)
GFR calc non Af Amer: >60 mL/min (ref >60)
GLUCOSE: 130 mg/dL — AB (ref 65–99)
Potassium: 4.2 mmol/L (ref 3.5–5.1)
Sodium: 131 mmol/L — ABNORMAL LOW (ref 135–145)

## 2014-10-11 LAB — PROTIME-INR
INR: 1.23 (ref 0.00–1.49)
PROTHROMBIN TIME: 15.6 s — AB (ref 11.6–15.2)

## 2014-10-11 MED ORDER — VANCOMYCIN HCL IN DEXTROSE 1-5 GM/200ML-% IV SOLN
1000.0000 mg | INTRAVENOUS | Status: AC
  Administered 2014-10-11: 1000 mg via INTRAVENOUS
  Filled 2014-10-11: qty 200

## 2014-10-11 MED ORDER — NICOTINE 21 MG/24HR TD PT24
21.0000 mg | MEDICATED_PATCH | Freq: Every day | TRANSDERMAL | Status: DC
  Administered 2014-10-11 – 2014-10-13 (×3): 21 mg via TRANSDERMAL
  Filled 2014-10-11 (×3): qty 1

## 2014-10-11 MED ORDER — HALOPERIDOL LACTATE 5 MG/ML IJ SOLN
2.5000 mg | Freq: Once | INTRAMUSCULAR | Status: AC
  Administered 2014-10-11: 2.5 mg via INTRAMUSCULAR
  Filled 2014-10-11: qty 1

## 2014-10-11 MED ORDER — VANCOMYCIN HCL 500 MG IV SOLR
500.0000 mg | Freq: Two times a day (BID) | INTRAVENOUS | Status: DC
  Administered 2014-10-12: 500 mg via INTRAVENOUS
  Filled 2014-10-11 (×2): qty 500

## 2014-10-11 MED ORDER — PIPERACILLIN-TAZOBACTAM 3.375 G IVPB
3.3750 g | Freq: Three times a day (TID) | INTRAVENOUS | Status: DC
  Administered 2014-10-11 – 2014-10-12 (×3): 3.375 g via INTRAVENOUS
  Filled 2014-10-11 (×4): qty 50

## 2014-10-11 MED ORDER — ENSURE ENLIVE PO LIQD
237.0000 mL | Freq: Three times a day (TID) | ORAL | Status: DC
  Administered 2014-10-11 – 2014-10-13 (×3): 237 mL via ORAL

## 2014-10-11 MED ORDER — WARFARIN SODIUM 5 MG PO TABS
5.0000 mg | ORAL_TABLET | Freq: Once | ORAL | Status: DC
  Filled 2014-10-11: qty 1

## 2014-10-11 NOTE — Consult Note (Signed)
Name: Paul [name] MRN: [id number] DOB: 09/25/1930    ADMISSION DATE:  10/10/2014 CONSULTATION DATE:  10/11/14  REFERRING MD :  Dr. Coralyn Pear   CHIEF COMPLAINT:  Hypoxia  BRIEF PATIENT DESCRIPTION: 81 y/o M, smoker, admitted 9/19 with weakness, fatigue, weight loss of 30 lbs, anorexia.  CT chest concerning for loculated fluid in apical area as well as cavitary lesions with air-fluid levels, increased nodule size in RUL.  PCCM consulted 9/20 for evaluation.   SIGNIFICANT EVENTS  9/19  Admitted to [facility] from MD office for hypoxia (sats 87% on RA), blood in urine, weakness, wt loss, anorexia.  CT chest concerning for loculated pleural collections, air fluid levels in cavitary lesions 9/20 PCCM consulted for evaluation   STUDIES:  9/20  CT Chest >> progression of emphysematous changes, R apical pleural collections, increased airspace disease in lower lobes, development of air-fluid level within a cavitary lesion at left lung base, new fluid levels in RUL, increased size of RUL nodule related to previous lung cancer treatment, concern for recurrence.    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 Julien Nordmann s/p chemo/XRT) who presented to [facility] on 9/19 from his physicians office after being found to be hypoxic on room air with saturations of 87%.  Also, the patient was noted to have blood in his urine.    The patient is a poor historian.  On presentation, he also complained of jaw pain, weakness, shortness of breath, anorexia, difficulty swallowing, cough and a 30 lb weight loss over 3 months.  Initial ER evaluation included a CXR which demonstrated increased patchy airspace disease throughout the right lung and in LUL.  He was admitted per [facility] for possible CAP and treated  with IV antibiotics (rocephin / azithromycin).  Given subjective complaints and abnormal CXR, a CT of the chest was evaluated out of concern for malignancy recurrence.  CT of the chest demonstrated progression of emphysematous changes, R apical pleural collections, increased airspace disease in lower lobes, development of air-fluid level within a cavitary lesion at left lung base, new fluid levels in RUL, increased size of RUL nodule - findings concerning for malignancy recurrnece.  He was also noted to have a sodium of 129 on admission.    PCCM consulted for pulmonary evaluation 9/20.      PAST MEDICAL HISTORY :   has a past medical history of Hypertension; COPD (chronic obstructive pulmonary disease); Hemorrhoids; Complex partial seizure; GERD (gastroesophageal reflux disease); Emphysema; HOH (hard of hearing); Pneumonia; Seizures; Headache(784.0); Non-small cell carcinoma of lung (dx'd 2006); Lung cancer; and Stroke.  has past surgical history that includes Inner ear surgery; Toe amputation (Left); Tonsillectomy; Amputation (Left, 04/16/2013); and Lipoma excision (Left, 09/08/2013).   Prior to Admission medications   Medication Sig Start Date End Date Taking? Authorizing Provider  Fluticasone-Salmeterol (ADVAIR) 250-50 MCG/DOSE AEPB Inhale 1 puff into the lungs 2 (two) times daily.   Yes Historical Provider, MD  PROAIR HFA 108 (90 BASE) MCG/ACT inhaler INHALE 2 PUFFS EVERY 4-6 HOURS AS NEEDED FOR COUGH OR WHEEZING 08/29/14  Yes Historical Provider, MD  gabapentin (NEURONTIN) 300 MG capsule Take 300 mg by mouth every evening.    Historical Provider, MD  hydrocortisone 2.5 % cream Apply 1 application topically daily.  09/03/13   Historical  Provider, MD  ipratropium-albuterol (DUONEB) 0.5-2.5 (3) MG/3ML SOLN Inhale 3 mL by nebulization every six (6) hours as needed. For shortness of breath/wheezing 07/06/14 07/06/15  Historical Provider, MD  phenytoin (DILANTIN) 100 MG ER capsule Take 300 mg by mouth daily.     Historical Provider, MD   No Known Allergies  FAMILY HISTORY:  family history includes Heart failure in his brother. SOCIAL HISTORY:  reports that he has been smoking Cigarettes.  He has a 90 pack-year smoking history. He has never used smokeless tobacco. He reports that he does not drink alcohol or use illicit drugs.  REVIEW OF SYSTEMS:   Constitutional: Negative for fevers, chills, and diaphoresis. Reports 40 lb weight loss over 3 months, malaise/fatigue HENT: Negative for hearing loss, ear pain, nosebleeds, congestion, sore throat, neck pain, tinnitus and ear discharge.   Eyes: Negative for blurred vision, double vision, photophobia, pain, discharge and redness.  Respiratory: Negative for hemoptysis, sputum production, wheezing and stridor. Reports cough / SOB  Cardiovascular: Negative for chest pain, palpitations, orthopnea, claudication, leg swelling and PND.  Gastrointestinal: Negative for heartburn, nausea, vomiting, abdominal pain, diarrhea, constipation, blood in stool and melena.  Genitourinary: Negative for dysuria, urgency, frequency, hematuria and flank pain.  Musculoskeletal: Negative for myalgias, back pain, joint pain and falls.  Skin: Negative for itching and rash.  Neurological: Negative for dizziness, tingling, tremors, sensory change, speech change, focal weakness, seizures, loss of consciousness, weakness and headaches.  Endo/Heme/Allergies: Negative for environmental allergies and polydipsia. Does not bruise/bleed easily.  SUBJECTIVE:   VITAL SIGNS: Temp:  [97.3 F (36.3 C)-98 F (36.7 C)] 98 F (36.7 C) (09/20 1436) Pulse Rate:  [71-102] 102 (09/20 1436) Resp:  [18-20] 20 (09/20 1436) BP: (119)/(56) 119/56 mmHg (09/20 1132) SpO2:  [95 %-97 %] 95 % (09/20 1532) Weight:  [116 lb 13.5 oz (53 kg)] 116 lb 13.5 oz (53 kg) (09/20 1300)  PHYSICAL EXAMINATION: General:  Frail elderly male in NAD  Neuro:  Awake, alert  HEENT:  MM pink/moist, no jvd    Cardiovascular:  s1s2 rrr, no m/r/g Lungs:  Even/non-labored, lungs bilaterally diminished but clear  Abdomen:  NTND, bsx4 active  Musculoskeletal:  No acute deformities  Skin:  Warm/dry, no edema    Recent Labs Lab 10/10/14 0940 10/11/14 0525  NA 129* 131*  K 4.4 4.2  CL 92* 96*  CO2 28 27  BUN 11 10  CREATININE 0.48* 0.41*  GLUCOSE 127* 130*    Recent Labs Lab 10/10/14 0940 10/11/14 0525  HGB 12.7* 11.4*  HCT 38.9* 35.7*  WBC 9.9 9.0  PLT 414* 379   Dg Chest 2 View  10/10/2014   CLINICAL DATA:  Shortness of breath, productive cough.  EXAM: CHEST  2 VIEW  COMPARISON:  07/13/2014  FINDINGS: Patchy airspace disease throughout the right lung, increasing since prior study. There is likely chronic underlying scarring, but findings are concerning for superimposed infiltrate/ pneumonia. Scarring noted in the left lung base. Possible early patchy opacity in the left upper lobe. Underlying COPD. Heart is borderline in size. No effusions.  IMPRESSION: Increasing patchy airspace disease throughout the right lung, likely pneumonia superimposed on chronic scarring. New patchy opacity in the left upper lobe could reflect early pneumonia as well.  COPD.   Electronically Signed   By: Rolm Baptise M.D.   On: 10/10/2014 10:54   Ct Chest W Contrast  10/10/2014   CLINICAL DATA:  Worsening shortness of breath associated with productive cough. Yellow sputum over the past  week. History of stage IIIA non-small cell lung cancer diagnosed in November 2005. Disease recurrence in fibroid 2007. Chemo radiation therapy. History of pulmonary emboli knee, stroke, anticoagulation, chronic obstructive pulmonary disease. Tobacco use.  EXAM: CT CHEST WITH CONTRAST  TECHNIQUE: Multidetector CT imaging of the chest was performed during intravenous contrast administration.  CONTRAST:  94m OMNIPAQUE IOHEXOL 300 MG/ML  SOLN  COMPARISON:  CT of the chest on 12/25/2012  FINDINGS: Heart: Heart is normal in size. There is  significant coronary artery calcification. No pericardial effusion.  Vascular structures: There is atherosclerotic calcification of the thoracic aorta. Numerous foci of mural plaques, especially at the diaphragmatic hiatus. No evidence for aneurysm or dissection. The larger pulmonary arteries are grossly well opacified. Study was not performed for evaluation of pulmonary embolus.  Mediastinum/thyroid: A 6 mm right thyroid nodule is noted. Subcarinal lymph node is 1.5 cm.  Lungs/Airways: There is significant severe emphysema, progressed since previous exam. Significant fibrosis is again identified within the posterior right upper lobe. An adjacent nodule measures 10 x 8 mm. Previously this measured 5 x 9 mm. There is a new cavitary lesion within the left lung base measuring 6.0 x 3.3 cm. Small amount of fluid is identified within the right lower lobe cystic spaces. There is airspace disease involving the lower lobes bilaterally, increased since the prior study.  There are rim enhancing air-fluid collections within the pleural space on the right. The larger is approximately 6.3 x 1.6 x 14 cm. The more medial and smaller collection is 2.7 x 1.0 by 4.3 cm. This  Upper abdomen: Large hiatal hernia is present. There is marked atherosclerotic calcification of the visualized abdominal aorta.  Chest wall/osseous structures: There has been interval wedge compression fracture of T5 with approximately 30% loss of anterior height. No retropulsion of fracture fragments. No suspicious lytic or blastic lesions are identified.  IMPRESSION: 1. Progression emphysematous changes. 2. Interval development of right apical pleural collections. Findings favor small empyemas. 3. Increased airspace disease within the lower lobes bilaterally. 4. Interval development of air-fluid level within a cavitary lesion at the left lung base, likely infectious involvement of emphysematous changes. 5. New fluid levels within right lower lobe emphysematous  changes. 6. Increased size of right upper lobe nodule adjacent to fibrosis related to previous lung cancer treatment. Findings are suspicious for recurrence. Nodule currently measures 0.8 x 1.0 cm.   Electronically Signed   By: ENolon NationsM.D.   On: 10/10/2014 17:30   Dg Chest Port 1 View  10/11/2014   CLINICAL DATA:  Pneumonia.  EXAM: PORTABLE CHEST - 1 VIEW  COMPARISON:  Chest x-ray 10/10/2014, 07/13/2014. Chest CT 10/10/2014 .  FINDINGS: Mediastinum and hilar structures are stable. Loculated pleural fluid/air collection present along the upper medial portion of the chest again noted, slight improvement. Previously identified loculated pleural fluid collection in the left base not identified. Persistent multifocal pulmonary infiltrates particularly from the right upper and right lower lobes again noted. Heart size stable. No acute bony abnormality.  IMPRESSION: 1. Loculated pleural fluid/air collection along the upper medial chest again noted. Slight improvement from prior exam. Loculated pleural fluid collection in the left lower chest no longer identified. 2. Persistent multifocal pulmonary infiltrates, particularly prominent in the right upper and right lower lobes. No interim change.   Electronically Signed   By: [place]  On: 10/11/2014 07:30    ASSESSMENT / PLAN:  Loculated Apical Pleural Fluid Collection ? Bulla/Bullitis with Air Fluid Level Cavitary Lesions  with Air Fluid Levels ? CAP   Plan: Continue antibiotics, recommend 10 days therapy Repeat imaging in 1-2 months to review fluid collections Fluid is not amenable to drainage.  Would not attempt as patient is high risk for creation of BP fistula, pneumothorax etc He would not be a surgical candidate   Acute Hypoxemic Respiratory Failure  COPD  Severe Emphysema  Tobacco Abuse   Plan: Smoking cessation counseling  Duoneb QID Dulera Oxygen as needed to support saturations 88-95%   PE - hx of, on anticoagulation  but pt reports he was recently told to stop.  INR on admit 1.03  Plan: Coumadin per Primary [facility]   Stage IIIa Non-Small Cell Lung Cancer - followed by Dr. Julien Nordmann, s/p chemo / XRT, completed carboplatin/paclitaxel in 2006.  CT findings concerning for recurrence. RUL Nodule - increased in size    Plan: Doubt patient would be a candidate for therapy beyond palliation Recommend ONC input.    Failure to Thrive  Protein Calorie Malnutrition   Plan: Ensure  Diet as tolerated  Consider nutrition input   Noe Gens, NP-C [facility] Pgr: [phone] or if no answer [phone] 10/11/2014, 3:39 PM   STAFF NOTE: I, Merrie Roof, MD FACP have personally reviewed patient's available data, including medical history, events of note, physical examination and test results as part of my evaluation. I have discussed with resident/NP and other care providers such as pharmacist, RN and RRT. In addition, I personally evaluated patient and elicited key findings of: No distress, hard of hearing, cachectic sever muscle wasting, reduced BS rt apical, CT and all films reviewed also from prior imaging from lung cancer diagnosis and treatment, his bolous emphysema is severe and worsening, this small loculation is unlikely empyema, but reoccurence cancer as is the cavitation, we should NOT pursue drainage as such high risk BP fistula leak and limited benedit, would treat empiric 8-10 days ABX and repeat imaging in 4 weeks, his functional status does not appear good, conider oncology assessment.  Lavon Paganini. Titus Mould, MD, [place] Pgr: [facility] 10/11/2014 10:15 PM

## 2014-10-11 NOTE — Progress Notes (Signed)
[facility] PROGRESS NOTE  Paul [name] NID:[id number] DOB: [birth year] DOA: 10/10/2014 PCP: Junie Panning, NP  Assessment/Plan: 1. Empyema/cavitary lesion/pneumonia.  -Paul [name] presented with complaints of functional decline, failure to thrive, significant weight loss, shortness of breath for which was further workup with a CT scan that showed multiple rim-enhancing fluid-air collections within the pleural space. -His antimicrobial regimen has been changed to Zosyn and vancomycin -I think aspiration pneumonia or postobstructive pneumonia are possibilities. Nursing staff reported him getting choked up on his food. Have consulted speech therapy. Changed him to dysphagia 3 diet. -He remains hemodynamically stable, nontoxic appearing, afebrile and hemodynamically stable -Pulmonary critical care medicine consulted  2.  Possible recurrence of lung cancer. -Patient having a history of stage IIIa non-small cell lung cancer briefly receive the treatment at the cancer center by Dr. Julien Nordmann -He presents with significant weight loss, functional decline, failure to thrive, appears cachectic on exam -CT scan of lungs with IV contrast done on admission revealed evidence of progression of disease. -I discussed case with his medical oncologist Dr. Julien Nordmann who recommended that patient follow up clinic for further evaluation and treatment.  3.  Failure to thrive/functional decline -As mentioned above patient presenting with significant weight loss, generalized weakness, having difficulties getting around at home, having an increase healthcare needs -I think this likely multifactorial with recurrence of lung cancer as well as pneumonia/empyema and dehydration all likely contributors -Physical therapy consulted patient will likely require rehabilitation at skilled nursing facility. -I suspect he has cognitive impairment at baseline, on mini-cog he was unable to recall any of the 3 objects. It appears  that he had not been taking any of his medications prior to this hospitalization. He isunsafe to go home.  4.  Lifelong anticoagulation. -Previous notes on Epic reporting patient having a history of DVT, pulmonary embolism, embolic stroke. He states that he was taken off of anticoagulation about a month ago by his primary care provider. Pharmacy discussed this with his significant other who confirmed this. It appears the reason for this was they felt risk of anticoagulation outweigh the benefits.   5.  History of stage IIIa non-small cell lung cancer. -CT scan of lungs with IV contrast showing evidence of recurrence of disease. -I spoke with his oncologist Dr Julien Nordmann who recommended follow-up in the outpatient setting  6.  Hyponatremia. -Labs showing improvement in sodium from 129 on admission to 131 on today's lab work. May be secondary to dehydration as her sodium improved with demonstration of IV fluids in the context of decreased by mouth intake. With underlying lung issues SIADH is also a possibility.  7.  Protein calorie malnutrition -Provide protein boost 3 times a day  Code Status: Full code Family Communication: Family not available Disposition Plan: Today patient will require rehabilitation at skilled nursing facility after this hospitalization   Consultants:  Pulmonary critical care medicine   Antibiotics:  Vancomycin  Zosyn  HPI/Subjective: Paul [name] is a pleasant 81 year old gentleman with a history of stage IIIa non-small cell lung cancer that was diagnosed November 2005 with disease recurrence in February 2007, undergoing treatment at the cancer center with chemoradiation therapy, also having a history of pulmonary emboli and embolic stroke, cognitive impairment, admitted to the medicine service on 10/10/2014 when he presented with complaints of shortness of breath associated with cough. He also reported a history of significant weight loss as well as generalized  weakness, anorexia, having increasing difficulties getting around his home. Chest x-ray on admission showed  possible loculated pleural fluid/air collection as well as multifocal pulmonary infiltrates. He was further worked up with a CT scan of lungs which revealed interval development of air-fluid levels with cavitary lesion at left lung base, increased size of right upper lobe nodule concerning for recurrence of cancer, increase airspace disease within lower lobes bilaterally.  Objective: Filed Vitals:   10/11/14 1132  BP: 119/56  Pulse: 71  Temp: 97.3 F (36.3 C)  Resp: 18    Intake/Output Summary (Last 24 hours) at 10/11/14 1238 Last data filed at 10/11/14 0700  Gross per 24 hour  Intake 1671.25 ml  Output    100 ml  Net 1571.25 ml   There were no vitals filed for this visit.  Exam:   General:  Cachectic, wasted, chronically ill-appearing, confused  Cardiovascular: Regular rate and rhythm normal S1-S2, no edema  Respiratory: Diminished breath sounds bilaterally having a few rhonchi  Abdomen: Soft nontender nondistended  Musculoskeletal: Having significant muscle atrophy  Data Reviewed: Basic Metabolic Panel:  Recent Labs Lab 10/10/14 0940 10/11/14 0525  NA 129* 131*  K 4.4 4.2  CL 92* 96*  CO2 28 27  GLUCOSE 127* 130*  BUN 11 10  CREATININE 0.48* 0.41*  CALCIUM 9.0 8.2*   Liver Function Tests: No results for input(s): AST, ALT, ALKPHOS, BILITOT, PROT, ALBUMIN in the last 168 hours. No results for input(s): LIPASE, AMYLASE in the last 168 hours. No results for input(s): AMMONIA in the last 168 hours. CBC:  Recent Labs Lab 10/10/14 0940 10/11/14 0525  WBC 9.9 9.0  HGB 12.7* 11.4*  HCT 38.9* 35.7*  MCV 82.1 83.0  PLT 414* 379   Cardiac Enzymes:  Recent Labs Lab 10/10/14 0953  TROPONINI <0.03   BNP (last 3 results) No results for input(s): BNP in the last 8760 hours.  ProBNP (last 3 results) No results for input(s): PROBNP in the last 8760  hours.  CBG: No results for input(s): GLUCAP in the last 168 hours.  Recent Results (from the past 240 hour(s))  Culture, blood (routine x 2)     Status: None (Preliminary result)   Collection Time: 10/10/14  1:14 PM  Result Value Ref Range Status   Specimen Description BLOOD LEFT ANTECUBITAL  Final   Special Requests   Final    BOTTLES DRAWN AEROBIC AND ANAEROBIC 5.5CC Performed at [facility]    Culture PENDING  Incomplete   Report Status PENDING  Incomplete     Studies: Dg Chest 2 View  10/10/2014   CLINICAL DATA:  Shortness of breath, productive cough.  EXAM: CHEST  2 VIEW  COMPARISON:  07/13/2014  FINDINGS: Patchy airspace disease throughout the right lung, increasing since prior study. There is likely chronic underlying scarring, but findings are concerning for superimposed infiltrate/ pneumonia. Scarring noted in the left lung base. Possible early patchy opacity in the left upper lobe. Underlying COPD. Heart is borderline in size. No effusions.  IMPRESSION: Increasing patchy airspace disease throughout the right lung, likely pneumonia superimposed on chronic scarring. New patchy opacity in the left upper lobe could reflect early pneumonia as well.  COPD.   Electronically Signed   By: Rolm Baptise M.D.   On: 10/10/2014 10:54   Ct Chest W Contrast  10/10/2014   CLINICAL DATA:  Worsening shortness of breath associated with productive cough. Yellow sputum over the past week. History of stage IIIA non-small cell lung cancer diagnosed in November 2005. Disease recurrence in fibroid 2007. Chemo radiation therapy. History of  pulmonary emboli knee, stroke, anticoagulation, chronic obstructive pulmonary disease. Tobacco use.  EXAM: CT CHEST WITH CONTRAST  TECHNIQUE: Multidetector CT imaging of the chest was performed during intravenous contrast administration.  CONTRAST:  34m OMNIPAQUE IOHEXOL 300 MG/ML  SOLN  COMPARISON:  CT of the chest on 12/25/2012  FINDINGS: Heart: Heart is normal  in size. There is significant coronary artery calcification. No pericardial effusion.  Vascular structures: There is atherosclerotic calcification of the thoracic aorta. Numerous foci of mural plaques, especially at the diaphragmatic hiatus. No evidence for aneurysm or dissection. The larger pulmonary arteries are grossly well opacified. Study was not performed for evaluation of pulmonary embolus.  Mediastinum/thyroid: A 6 mm right thyroid nodule is noted. Subcarinal lymph node is 1.5 cm.  Lungs/Airways: There is significant severe emphysema, progressed since previous exam. Significant fibrosis is again identified within the posterior right upper lobe. An adjacent nodule measures 10 x 8 mm. Previously this measured 5 x 9 mm. There is a new cavitary lesion within the left lung base measuring 6.0 x 3.3 cm. Small amount of fluid is identified within the right lower lobe cystic spaces. There is airspace disease involving the lower lobes bilaterally, increased since the prior study.  There are rim enhancing air-fluid collections within the pleural space on the right. The larger is approximately 6.3 x 1.6 x 14 cm. The more medial and smaller collection is 2.7 x 1.0 by 4.3 cm. This  Upper abdomen: Large hiatal hernia is present. There is marked atherosclerotic calcification of the visualized abdominal aorta.  Chest wall/osseous structures: There has been interval wedge compression fracture of T5 with approximately 30% loss of anterior height. No retropulsion of fracture fragments. No suspicious lytic or blastic lesions are identified.  IMPRESSION: 1. Progression emphysematous changes. 2. Interval development of right apical pleural collections. Findings favor small empyemas. 3. Increased airspace disease within the lower lobes bilaterally. 4. Interval development of air-fluid level within a cavitary lesion at the left lung base, likely infectious involvement of emphysematous changes. 5. New fluid levels within right lower  lobe emphysematous changes. 6. Increased size of right upper lobe nodule adjacent to fibrosis related to previous lung cancer treatment. Findings are suspicious for recurrence. Nodule currently measures 0.8 x 1.0 cm.   Electronically Signed   By: ENolon NationsM.D.   On: 10/10/2014 17:30   Dg Chest Port 1 View  10/11/2014   CLINICAL DATA:  Pneumonia.  EXAM: PORTABLE CHEST - 1 VIEW  COMPARISON:  Chest x-ray 10/10/2014, 07/13/2014. Chest CT 10/10/2014 .  FINDINGS: Mediastinum and hilar structures are stable. Loculated pleural fluid/air collection present along the upper medial portion of the chest again noted, slight improvement. Previously identified loculated pleural fluid collection in the left base not identified. Persistent multifocal pulmonary infiltrates particularly from the right upper and right lower lobes again noted. Heart size stable. No acute bony abnormality.  IMPRESSION: 1. Loculated pleural fluid/air collection along the upper medial chest again noted. Slight improvement from prior exam. Loculated pleural fluid collection in the left lower chest no longer identified. 2. Persistent multifocal pulmonary infiltrates, particularly prominent in the right upper and right lower lobes. No interim change.   Electronically Signed   By: TEastpoint  On: 10/11/2014 07:30    Scheduled Meds: . azithromycin  500 mg Intravenous Q24H  . cefTRIAXone (ROCEPHIN)  IV  1 g Intravenous Q24H  . chlorhexidine  15 mL Mouth/Throat BID  . enoxaparin (LOVENOX) injection  40 mg Subcutaneous Q24H  .  gabapentin  300 mg Oral QPM  . ipratropium-albuterol  3 mL Nebulization QID  . mometasone-formoterol  2 puff Inhalation BID  . phenytoin  300 mg Oral Daily  . predniSONE  20 mg Oral BID WC  . sodium chloride  3 mL Intravenous Q12H   Continuous Infusions: . sodium chloride 75 mL/hr at 10/10/14 1729    Principal Problem:   CAP (community acquired pneumonia) Active Problems:   Shortness of breath    ALCOHOL ABUSE, HX OF   FTT (failure to thrive) in adult   Community acquired pneumonia    Time spent: 37 min    Kelvin Cellar  [facility] Pager [phone]. If 7PM-7AM, please contact night-coverage at [email or web address], password [facility] 10/11/2014, 12:38 PM  LOS: 1 day

## 2014-10-11 NOTE — Progress Notes (Signed)
ANTIBIOTIC CONSULT NOTE - INITIAL  Pharmacy Consult for Vancomycin Indication: Empyema/pneumonia  No Known Allergies  Patient Measurements: Height: '5\' 11"'$  (180.3 cm) Weight: 116 lb 13.5 oz (53 kg) IBW/kg (Calculated) : 75.3   Vital Signs: Temp: 97.3 F (36.3 C) (09/20 1132) Temp Source: Oral (09/20 1132) BP: 119/56 mmHg (09/20 1132) Pulse Rate: 71 (09/20 1132) Intake/Output from previous day: 09/19 0701 - 09/20 0700 In: 1671.3 [P.O.:240; I.V.:1181.3; IV Piggyback:250] Out: 100 [Urine:100] Intake/Output from this shift:    Labs:  Recent Labs  10/10/14 0940 10/11/14 0525  WBC 9.9 9.0  HGB 12.7* 11.4*  PLT 414* 379  CREATININE 0.48* 0.41*   Estimated Creatinine Clearance: 54.3 mL/min (by C-G formula based on Cr of 0.41). No results for input(s): VANCOTROUGH, VANCOPEAK, VANCORANDOM, GENTTROUGH, GENTPEAK, GENTRANDOM, TOBRATROUGH, TOBRAPEAK, TOBRARND, AMIKACINPEAK, AMIKACINTROU, AMIKACIN in the last 72 hours.   Microbiology: Recent Results (from the past 720 hour(s))  Culture, blood (routine x 2)     Status: None (Preliminary result)   Collection Time: 10/10/14  1:14 PM  Result Value Ref Range Status   Specimen Description BLOOD LEFT ANTECUBITAL  Final   Special Requests   Final    BOTTLES DRAWN AEROBIC AND ANAEROBIC 5.5CC Performed at [facility]    Culture PENDING  Incomplete   Report Status PENDING  Incomplete    Medical History: Past Medical History  Diagnosis Date  . Hypertension   . COPD (chronic obstructive pulmonary disease)   . Hemorrhoids     external  . 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 GERD (gastroesophageal reflux disease)   . Emphysema   . HOH (hard of hearing)   . Pneumonia   . Seizures   . Headache(784.0)   . Non-small cell carcinoma of lung dx'd 2006    stage 3  . Lung cancer   . Stroke      Assessment: 81 y/o M with hx of Stage IIIa NSCLC, DVT, PE, embolic CVA, seizure, cognitive impairment,  significant weight loss admitted 9/19 for complaints of SOB, cough CAP and FTT. Patient initially treated with Azithromycin and Ceftriaxone for CAP. CT scan showed multiple rim-enhancing fluid-air collections within the pleural space. Antibiotic regimen changed to Vancomycin per pharmacy dosing and Zosyn per MD for empyema/ aspiration pneumonia vs postobstructive pneumonia.  9/19 >> Azithromycin >> 9/20 9/19 >> Ceftriaxone >> 9/20 9/20 >> Vancomycin >> 9/20 >> Zosyn >>   9/19 blood x 2: pending  Afebrile WBC WNL SCr 0.41, CrCl ~ 54 ml/min CG   Goal of Therapy:  Vancomycin trough level 15-20 mcg/ml  Appropriate antibiotic dosing for renal function and indication Eradication of infection  Plan:   Vancomycin 1g IV x 1, then '500mg'$  IV q12h.  Plan for Vancomycin trough level at steady state.  Continue Zosyn 3.375g IV q8h (infuse over 4 hours) per MD.  Monitor renal function, cultures, clinical course.    Lindell Spar, PharmD, BCPS Pager: [phone] 10/11/2014 1:44 PM

## 2014-10-11 NOTE — Progress Notes (Addendum)
Pt physically agressive towards staff, kicking and grabbing. Refusing to keep telemetry monitor on at this time. IM haldol administered. MD notified, RN and CN will continue to monitor patient.

## 2014-10-11 NOTE — Evaluation (Signed)
Physical Therapy Evaluation Patient Details Name: Paul [name] MRN: [id number] DOB: [birth year] Today's Date: 10/11/2014   History of Present Illness  ROLEN CONGER is a 81 y.o. male with a past medical history of stage IIIa non-small cell lung cancer with disease recurrence in February 2007, H/O seizures,  history of pulmonary emboli, history of embolic stroke, on lifelong anticoagulation, chronic obstructive pulmonary disease ongoing tobacco abuse presenting to the emergency department 10/10/14  with complaints of shortness of breath. He states having generalized weakness, minimal by mouth intake, difficulties getting around at home over the past 2 weeks. He also reports anorexia having significant weight loss over the past several months. Also hematuria, sodium 129.  Clinical Impression  Patient very [place], did participate inn mobility. Did not ambulate due to BP drop-systolic when standing. Patient will benefit from PT to address problems listed in note below( PT Problem List)/    Follow Up Recommendations SNF;Supervision/Assistance - 24 hour    Equipment Recommendations  None recommended by PT    Recommendations for Other Services       Precautions / Restrictions Precautions Precautions: Fall Precaution Comments: monitor BP. low      Mobility  Bed Mobility Overal bed mobility: Independent             General bed mobility comments: patient up and down to edge of bed  several times while PT in room.  Transfers Overall transfer level: Needs assistance Equipment used: Rolling walker (2 wheeled) Transfers: Sit to/from Stand Sit to Stand: Min guard         General transfer comment: cues for safety. patient requested to sit down due to feeling weak.  orthostatic BP taken see Doc Flowsheets.  Ambulation/Gait             General Gait Details: NT due to BP drop  Stairs            Wheelchair Mobility    Modified Rankin (Stroke Patients Only)        Balance Overall balance assessment: History of Falls;Needs assistance Sitting-balance support: No upper extremity supported;Feet supported Sitting balance-Leahy Scale: Good     Standing balance support: During functional activity;Bilateral upper extremity supported Standing balance-Leahy Scale: Fair                               Pertinent Vitals/Pain Pain Assessment: No/denies pain  Supine =101/49, sit 100/51. Standing 82/53, sats 96% RA, HR 96-104    Home Living Family/patient expects to be discharged to:: Private residence Living Arrangements: Alone;Other relatives Available Help at Discharge: Available PRN/intermittently (my friend, all my other family ate dead.) Type of Home: Apartment Home Access: Level entry       Home Equipment: Walker - 2 wheels      Prior Function Level of Independence: Independent with assistive device(s)         Comments: was still driving     Hand Dominance        Extremity/Trunk Assessment   Upper Extremity Assessment: Generalized weakness           Lower Extremity Assessment: Generalized weakness      Cervical / Trunk Assessment: Kyphotic  Communication   Communication: HOH  Cognition Arousal/Alertness: Awake/alert Behavior During Therapy: Agitated (at times when talking about all the people coming in to bother him.) Overall Cognitive Status: No family/caregiver present to determine baseline cognitive functioning Area of Impairment: Orientation;Awareness;Safety/judgement Orientation Level: Time  Memory: Decreased recall of precautions   Safety/Judgement: Decreased awareness of safety     General Comments: stated moth  as 10, knew day of week and place and reason in hospital but not reason  for stayuing inthe hospital.    General Comments      Exercises        Assessment/Plan    PT Assessment Patient needs continued PT services  PT Diagnosis Difficulty walking;Generalized weakness   PT  Problem List Decreased strength;Decreased activity tolerance;Decreased mobility;Decreased cognition;Decreased knowledge of use of DME;Decreased safety awareness;Decreased knowledge of precautions;Cardiopulmonary status limiting activity  PT Treatment Interventions DME instruction;Gait training;Functional mobility training;Therapeutic activities;Therapeutic exercise;Patient/family education   PT Goals (Current goals can be found in the Care Plan section) Acute Rehab PT Goals Patient Stated Goal: I don't want to go to no ALF or nursinghome. PT Goal Formulation: With patient Time For Goal Achievement: 10/25/14 Potential to Achieve Goals: Good    Frequency Min 3X/week   Barriers to discharge Decreased caregiver support      Co-evaluation               End of Session Equipment Utilized During Treatment: Gait belt Activity Tolerance: Treatment limited secondary to medical complications (Comment) (BP drop) Patient left: in bed;with call bell/phone within reach;with bed alarm set Nurse Communication: Mobility status         Time: 0902-0926 PT Time Calculation (min) (ACUTE ONLY): 24 min   Charges:   PT Evaluation $Initial PT Evaluation Tier I: 1 Procedure PT Treatments $Therapeutic Activity: 8-22 mins   PT G Codes:        Claretha Cooper 10/11/2014, 9:46 AM Tresa Endo PT [phone]

## 2014-10-11 NOTE — Progress Notes (Addendum)
ANTICOAGULATION CONSULT NOTE - [place] for warfarin Indication: hx of embolic CVA; hx of DVT, PE  No Known Allergies  Patient Measurements: Height: '5\' 11"'$  (180.3 cm) IBW/kg (Calculated) : 75.3   Vital Signs:    Labs:  Recent Labs  10/10/14 0940 10/10/14 0953 10/11/14 0525  HGB 12.7*  --   --   HCT 38.9*  --   --   PLT 414*  --   --   LABPROT  --  13.7 15.6*  INR  --  1.03 1.23  CREATININE 0.48*  --   --   TROPONINI  --  <0.03  --     CrCl cannot be calculated (Unknown ideal weight.).   Medical History: Past Medical History  Diagnosis Date  . Hypertension   . COPD (chronic obstructive pulmonary disease)   . Hemorrhoids     external  . 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 GERD (gastroesophageal reflux disease)   . Emphysema   . HOH (hard of hearing)   . Pneumonia   . Seizures   . Headache(784.0)   . Non-small cell carcinoma of lung dx'd 2006    stage 3  . Lung cancer   . Stroke     Medications:  Scheduled:  . azithromycin  500 mg Intravenous Q24H  . cefTRIAXone (ROCEPHIN)  IV  1 g Intravenous Q24H  . chlorhexidine  15 mL Mouth/Throat BID  . enoxaparin (LOVENOX) injection  40 mg Subcutaneous Q24H  . gabapentin  300 mg Oral QPM  . ipratropium-albuterol  3 mL Nebulization QID  . mometasone-formoterol  2 puff Inhalation BID  . phenytoin  300 mg Oral Daily  . predniSONE  20 mg Oral BID WC  . sodium chloride  3 mL Intravenous Q12H  . warfarin  5 mg Oral ONCE-1800  . Warfarin - Pharmacist Dosing Inpatient   Does not apply q1800   Infusions:  . sodium chloride 75 mL/hr at 10/10/14 1729   PRN: sodium chloride, acetaminophen **OR** acetaminophen, alum & mag hydroxide-simeth, haloperidol lactate, ondansetron **OR** ondansetron (ZOFRAN) IV, oxyCODONE, sodium chloride   Diet: Regular  Assessment: 81 y/o M with hx of Stage IIIa NSCLC, DVT, PE, embolic CVA, seizure, admitted 9/19 with CAP and FTT.  Per medical  records patient was previously on lifelong anticoagulation with warfarin.  INR subtherapeutic (1.03) on admission and patient stated to MD that he was instructed to discontinue warfarin about a month ago, although we do not have a record of this.  Due to high risk for CVA and VTE, attending MD has resumed warfarin and requested pharmacy dosing assistance.    According to available records patient's warfarin dosage varied between 2.5 mg daily and 5 mg daily.     Patient reports significant weight loss over past several months  Drug interactions:    Ceftriaxone and azithromycin for CAP. Broad spectrum antibiotics may disrupt vitamin-K producing bowel flora resulting in increased INR response to warfarin.  Phenytoin for hx seizure.  May lead to more rapid warfarin metabolism and decrease INR response  Prednisone for COPD,. May increase INR response to warfarin.  Currently on prophylactic-dose Lovenox (40 mg SQ q24h) per MD orders while INR subtherapeutic.  Today, 10/11/2014:  INR 1.23, subtherapeutic after one dose but trending up  Hgb 12.7, Pltc elevated (9/19)  No bleeding issues reported per nursing  Goal of Therapy:  INR 2-3    Plan:  1. Warfarin 5 mg PO  x 1 today at 6 pm. 2. Follow PT/INR daily while inpatient. 3. When INR 2.0 or above, discontinue prophylactic-dose SQ Lovenox. 4. Monitor for evidence of bleeding, follow clinical course.  Lindell Spar, PharmD, BCPS Pager: [phone] 10/11/2014 11:10 AM    Addendum:  Patient's emergency contact/family member states patient was taken off of warfarin ~ 3 months ago by PCP (ED notes indicate ED provider recommended stopping warfarin in June due to frequent falls).  Discussed with Dr. Grant Ruts MD, d/c warfarin for now and continue with prophylactic lovenox '40mg'$  SQ q24h.  Pharmacy will sign off. Please re-consult as needed.   Thanks.  Lindell Spar, PharmD, BCPS Pager: [phone] 10/11/2014 12:35 PM

## 2014-10-11 NOTE — Care Management Note (Signed)
Case Management Note  Patient Details  Name: KHRYSTIAN SCHAUF MRN: [id number] Date of Birth: [birth year]  Subjective/Objective:             Multifocal pna       Action/Plan Date:  Sept. 20, 2016 U.R. performed for needs and level of care. Will continue to follow for Case Management needs.  Velva Harman, RN, BSN, [place]   [phone]  Expected Discharge Date:   (UNKNOWN)               Expected Discharge Plan:  Skilled Nursing Facility  In-House Referral:  Clinical Social Work  Discharge planning Services  CM Consult  Post Acute Care Choice:  NA Choice offered to:  NA  DME Arranged:    DME Agency:     HH Arranged:    [place] Agency:     Status of Service:  In process, will continue to follow  Medicare Important Message Given:    Date Medicare IM Given:    Medicare IM give by:    Date Additional Medicare IM Given:    Additional Medicare Important Message give by:     If discussed at [place] of Stay Meetings, dates discussed:    Additional Comments:  Leeroy Cha, RN 10/11/2014, 10:35 AM

## 2014-10-12 DIAGNOSIS — C3431 Malignant neoplasm of lower lobe, right bronchus or lung: Secondary | ICD-10-CM

## 2014-10-12 DIAGNOSIS — J984 Other disorders of lung: Secondary | ICD-10-CM

## 2014-10-12 DIAGNOSIS — E43 Unspecified severe protein-calorie malnutrition: Secondary | ICD-10-CM | POA: Diagnosis present

## 2014-10-12 DIAGNOSIS — I10 Essential (primary) hypertension: Secondary | ICD-10-CM

## 2014-10-12 DIAGNOSIS — J189 Pneumonia, unspecified organism: Secondary | ICD-10-CM

## 2014-10-12 DIAGNOSIS — Z720 Tobacco use: Secondary | ICD-10-CM

## 2014-10-12 LAB — CBC
HEMATOCRIT: 34.4 % — AB (ref 39.0–52.0)
Hemoglobin: 11.2 g/dL — ABNORMAL LOW (ref 13.0–17.0)
MCH: 26.7 pg (ref 26.0–34.0)
MCHC: 32.6 g/dL (ref 30.0–36.0)
MCV: 81.9 fL (ref 78.0–100.0)
PLATELETS: 354 10*3/uL (ref 150–400)
RBC: 4.2 MIL/uL — AB (ref 4.22–5.81)
RDW: 14.2 % (ref 11.5–15.5)
WBC: 7.9 10*3/uL (ref 4.0–10.5)

## 2014-10-12 LAB — BASIC METABOLIC PANEL
ANION GAP: 8 (ref 5–15)
BUN: 10 mg/dL (ref 6–20)
CALCIUM: 8.4 mg/dL — AB (ref 8.9–10.3)
CO2: 26 mmol/L (ref 22–32)
Chloride: 96 mmol/L — ABNORMAL LOW (ref 101–111)
Creatinine, Ser: 0.4 mg/dL — ABNORMAL LOW (ref 0.61–1.24)
GLUCOSE: 106 mg/dL — AB (ref 65–99)
POTASSIUM: 4 mmol/L (ref 3.5–5.1)
Sodium: 130 mmol/L — ABNORMAL LOW (ref 135–145)

## 2014-10-12 LAB — PROTIME-INR
INR: 1.18 (ref 0.00–1.49)
Prothrombin Time: 15.1 seconds (ref 11.6–15.2)

## 2014-10-12 MED ORDER — AMOXICILLIN-POT CLAVULANATE 875-125 MG PO TABS
1.0000 | ORAL_TABLET | Freq: Two times a day (BID) | ORAL | Status: DC
  Administered 2014-10-12 – 2014-10-13 (×3): 1 via ORAL
  Filled 2014-10-12 (×4): qty 1

## 2014-10-12 NOTE — Evaluation (Addendum)
Clinical/Bedside Swallow Evaluation Patient Details  Name: Paul [name] MRN: [id number] Date of Birth: 09-30-1930  Today's Date: 10/12/2014 Time: SLP Start Time (ACUTE ONLY): 1110 SLP Stop Time (ACUTE ONLY): 1140 SLP Time Calculation (min) (ACUTE ONLY): 30 min  Past Medical History:  Past Medical History  Diagnosis Date  . Hypertension   . COPD (chronic obstructive pulmonary disease)   . Hemorrhoids     external  . 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 GERD (gastroesophageal reflux disease)   . Emphysema   . HOH (hard of hearing)   . Pneumonia   . Seizures   . Headache(784.0)   . Non-small cell carcinoma of lung dx'd 2006    stage 3  . Lung cancer   . Stroke    Past Surgical History:  Past Surgical History  Procedure Laterality Date  . Inner ear surgery    . Toe amputation Left   . Tonsillectomy    . Amputation Left 04/16/2013    Procedure: AMPUTATION RAY;  Surgeon: Newt Minion, MD;  Location: [facility];  Service: Orthopedics;  Laterality: Left;  Left Foot 4th and 5th Ray Amputation  . 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  PMH + for COPD, lung cancer 11/05 s/p chemorad, recurrence 02/2005 s/p chemorad - sent to ED by primary MD after being hypoxic and found to have elevated sodium levels.  Pt with increased size of right upper lobe lung nodule  - pt reports concern for recurrence of cancer.  Pt reports weight loss of 30-40 pounds within six months - he is cachetic with severe muscle wasting per md note.  Pt denies h/o CVA.     Assessment / Plan / Recommendation Clinical Impression  Pt presents with aspiration risk secondary to his COPD, ? impact of prior CVA and deconditioning.  Coughing noted at baseline due to  retained secretions but this also occurred consistently with intake (after approximately 80% of boluses).     Pt admits to coughing but he report this to occur more AFTER eating and states this has improved since admission.    Pt educated to SLP concern of aspiration and he declined to participate in further evaluation, therefore slp educated him to mitigation strategies to maximize airway protection.  SLP to sign off, as all education compelted.    If pt, MD desires further testing, esophagram may be helpful due to pt's report of sensation of slow clearance of esophagus at times and his h/o radiation tx for lung cancer.      Aspiration Risk    moderate and ongoing    Diet Recommendation Dysphagia 3 (Mech soft);Thin (with known aspiration risk)   Medication Administration: Whole meds with liquid Compensations: Slow rate;Follow solids with liquid;Small sips/bites (start meal with liquids, consume liquids t/o meal, rest if short of breath)    Other  Recommendations Oral Care Recommendations: Oral care BID   Follow Up Recommendations       Frequency and Duration        Pertinent Vitals/Pain Afebrile, congested cough      Swallow Study Prior Functional Status   denies dysphagia     General Date of  Onset: 10/12/14 Other Pertinent Information: 81 yo male referred for BSE due to pt coughing with intake for the last two weeks per chart review.  PMH + for COPD, lung cancer 11/05 s/p chemorad, recurrence 02/2005 s/p chemorad - sent to ED by primary MD after being hypoxic and found to have elevated sodium levels.  Pt with increased size of right upper lobe lung nodule  - pt reports concern for recurrence of cancer.  Pt reports weight loss of 30-40 pounds within six months - he is cachetic with severe muscle wasting per md note.  Pt denies h/o CVA.   Type of Study: Bedside swallow evaluation Diet Prior to this Study: Dysphagia 3 (soft);Thin liquids Temperature Spikes Noted: No Respiratory  Status: Room air History of Recent Intubation: No Behavior/Cognition: Alert;Cooperative;Pleasant mood;Other (Comment) (very HOH) Oral Cavity - Dentition: Edentulous Self-Feeding Abilities: Able to feed self Patient Positioning: Upright in bed Baseline Vocal Quality: Hoarse;Low vocal intensity Volitional Cough: Strong Volitional Swallow: Able to elicit    Oral/Motor/Sensory Function Overall Oral Motor/Sensory Function:  (palatal deviation to RT upon phonation, suspect LT weakness from prior cva-pt denies h/o cva)   Ice Chips Ice chips: Not tested   Thin Liquid Thin Liquid: Impaired Presentation: Cup;Self Fed;Straw Pharyngeal  Phase Impairments: Cough - Immediate;Throat Clearing - Delayed    Nectar Thick Nectar Thick Liquid: Impaired Presentation: Cup;Self Fed;Straw Pharyngeal Phase Impairments: Cough - Immediate;Throat Clearing - Delayed   Honey Thick Honey Thick Liquid: Not tested   Puree Puree: Not tested   Solid   GO    Solid: Impaired Presentation: Self Fed Oral Phase Impairments: Reduced lingual movement/coordination;Impaired anterior to posterior transit;Impaired mastication Oral Phase Functional Implications: Other (comment) (prolonged oral transiting) Pharyngeal Phase Impairments: Cough - Delayed       Luanna Salk, [facility] SLP [phone]

## 2014-10-12 NOTE — Progress Notes (Signed)
Initial Nutrition Assessment  DOCUMENTATION CODES:   Severe malnutrition in context of chronic illness, Underweight  INTERVENTION:  - Continue Ensure Enlive po BID, each supplement provides 350 kcal and 20 grams of protein - Encourage PO intakes during meals and of supplements - RD will continue to monitor for needs  NUTRITION DIAGNOSIS:   Increased nutrient needs related to catabolic illness, cancer and cancer related treatments as evidenced by estimated needs.  GOAL:   Patient will meet greater than or equal to 90% of their needs  MONITOR:   PO intake, Supplement acceptance, Weight trends, Labs, I & O's  REASON FOR ASSESSMENT:   Other (Comment) (underweight BMI)  ASSESSMENT:   81 y.o. male with a past medical history of stage IIIa non-small cell lung cancer that was diagnosed in November 2005 with disease recurrence in February 2007, had been seen in the cancer center by Dr. Julien Nordmann undergoing chemoradiation therapy, history of pulmonary emboli, history of embolic stroke, on lifelong anticoagulation, chronic obstructive pulmonary disease ongoing tobacco abuse presenting to the emergency department with complaints of shortness of breath associate with [place]. He states having generalized weakness, minimal by mouth intake, difficulties getting around at home over the past 2 weeks. He also reports anorexia having significant weight loss over the past several months.   Pt seen for underweight BMI. Pt seems to have some confusion during visit but is also very HOH which could be the underlying reason for confusion. Per chart review, pt ate 100% lunch and 75% dinner yesterday. He reports that for breakfast yesterday and today he had coffee, orange juice, milk, grits/oatmeal, and scrambled eggs. He reports good appetite.  Pt does not have any teeth and states he has not had teeth for 30 years. He has dentures but prefers not to wear them as they bother his gum-line (ill-fitting). He states  that his appetite typically fluctuates with some days being good and others he does not want to eat.  Pt reports he has lost 40 lbs but is unsure of time frame for weight loss. Per chart review, pt has lost 20 lbs (15% body weight) in the past 6 months which is significant for time frame. Severe muscle and fat wasting noted throughout.  Ensure Jeanne Ivan has been ordered BID and visitor in the room states that this would be beneficial for pt. Pt states he was not drinking supplement such as Ensure or Boost PTA.   Likely not meeting needs PTA. Medications reviewed. Labs reviewed; Na: 130 mmol/L, Cl: 96 mmol/L, creatinine low, Ca: 8.4 mg/dL.   Diet Order:  DIET DYS 3 Room service appropriate?: Yes; Fluid consistency:: Thin  Skin:  Reviewed, no issues  Last BM:  PTA  Height:   Ht Readings from Last 1 Encounters:  10/11/14 '5\' 11"'$  (1.803 m)    Weight:   Wt Readings from Last 1 Encounters:  10/11/14 116 lb 13.5 oz (53 kg)    Ideal Body Weight:  78.18 kg (kg)  BMI:  Body mass index is 16.3 kg/(m^2).  Estimated Nutritional Needs:   Kcal:  1600-1800  Protein:  65-75 grams  Fluid:  1.8-2 L/day  EDUCATION NEEDS:   No education needs identified at this time      Jarome Matin, RD, LDN Inpatient Clinical Dietitian Pager # [phone] After hours/weekend pager # [phone]

## 2014-10-12 NOTE — Care Management Important Message (Signed)
Important Message  Patient Details  Name: Paul [name] MRN: [id number] Date of Birth: April 22, 1930   Medicare Important Message Given:  Yes-second notification given    Camillo Flaming 10/12/2014, 11:52 AMImportant Message  Patient Details  Name: Paul [name] MRN: [id number] Date of Birth: 1931/01/06   Medicare Important Message Given:  Yes-second notification given    Camillo Flaming 10/12/2014, 11:52 AM

## 2014-10-12 NOTE — Progress Notes (Signed)
Name: Paul [name] MRN: [id number] DOB: [birth year]    ADMISSION DATE:  10/10/2014 CONSULTATION DATE:  10/11/14  REFERRING MD :  Dr. Coralyn Pear   CHIEF COMPLAINT:  Hypoxia  BRIEF PATIENT DESCRIPTION: 81 y/o M, smoker, admitted 9/19 with weakness, fatigue, weight loss of 30 lbs, anorexia.  CT chest concerning for loculated fluid in apical area as well as cavitary lesions with air-fluid levels, increased nodule size in RUL.  PCCM consulted 9/20 for evaluation.   SIGNIFICANT EVENTS  9/19  Admitted to [facility] from MD office for hypoxia (sats 87% on RA), blood in urine, weakness, wt loss, anorexia.  CT chest concerning for loculated pleural collections, air fluid levels in cavitary lesions 9/20 PCCM consulted for evaluation   STUDIES:  9/20  CT Chest >> progression of emphysematous changes, R apical pleural collections, increased airspace disease in lower lobes, development of air-fluid level within a cavitary lesion at left lung base, new fluid levels in RUL, increased size of RUL nodule related to previous lung cancer treatment, concern for recurrence.    SUBJECTIVE:  Wants to go home  VITAL SIGNS: Temp:  [98 F (36.7 C)] 98 F (36.7 C) (09/20 1436) Pulse Rate:  [78-102] 78 (09/21 0620) Resp:  [18-20] 18 (09/21 0620) BP: (112-121)/(56-85) 121/56 mmHg (09/21 0620) SpO2:  [95 %-99 %] 99 % (09/21 0953) Weight:  [53 kg (116 lb 13.5 oz)] 53 kg (116 lb 13.5 oz) (09/20 1300)  PHYSICAL EXAMINATION: General:  Frail elderly male in NAD  Neuro:  Awake, alert, very hard of hearing HEENT:  MM pink/moist, no jvd  Cardiovascular:  s1s2 rrr, no m/r/g Lungs:  Even/non-labored, lungs bilaterally diminished but clear  Abdomen:  NTND, bsx4 active  Musculoskeletal:  No acute deformities  Skin:  Warm/dry, no edema    Recent Labs Lab 10/10/14 0940 10/11/14 0525 10/12/14 0515  NA 129* 131* 130*  K 4.4 4.2 4.0  CL 92* 96* 96*  CO2 '28 27 26  '$ BUN '11 10 10  '$ CREATININE 0.48* 0.41* 0.40*  GLUCOSE  127* 130* 106*    Recent Labs Lab 10/10/14 0940 10/11/14 0525 10/12/14 0515  HGB 12.7* 11.4* 11.2*  HCT 38.9* 35.7* 34.4*  WBC 9.9 9.0 7.9  PLT 414* 379 354   Ct Chest W Contrast  10/10/2014   CLINICAL DATA:  Worsening shortness of breath associated with productive cough. Yellow sputum over the past week. History of stage IIIA non-small cell lung cancer diagnosed in November 2005. Disease recurrence in fibroid 2007. Chemo radiation therapy. History of pulmonary emboli knee, stroke, anticoagulation, chronic obstructive pulmonary disease. Tobacco use.  EXAM: CT CHEST WITH CONTRAST  TECHNIQUE: Multidetector CT imaging of the chest was performed during intravenous contrast administration.  CONTRAST:  65m OMNIPAQUE IOHEXOL 300 MG/ML  SOLN  COMPARISON:  CT of the chest on 12/25/2012  FINDINGS: Heart: Heart is normal in size. There is significant coronary artery calcification. No pericardial effusion.  Vascular structures: There is atherosclerotic calcification of the thoracic aorta. Numerous foci of mural plaques, especially at the diaphragmatic hiatus. No evidence for aneurysm or dissection. The larger pulmonary arteries are grossly well opacified. Study was not performed for evaluation of pulmonary embolus.  Mediastinum/thyroid: A 6 mm right thyroid nodule is noted. Subcarinal lymph node is 1.5 cm.  Lungs/Airways: There is significant severe emphysema, progressed since previous exam. Significant fibrosis is again identified within the posterior right upper lobe. An adjacent nodule measures 10 x 8 mm. Previously this measured 5 x 9 mm. There  is a new cavitary lesion within the left lung base measuring 6.0 x 3.3 cm. Small amount of fluid is identified within the right lower lobe cystic spaces. There is airspace disease involving the lower lobes bilaterally, increased since the prior study.  There are rim enhancing air-fluid collections within the pleural space on the right. The larger is approximately 6.3 x  1.6 x 14 cm. The more medial and smaller collection is 2.7 x 1.0 by 4.3 cm. This  Upper abdomen: Large hiatal hernia is present. There is marked atherosclerotic calcification of the visualized abdominal aorta.  Chest wall/osseous structures: There has been interval wedge compression fracture of T5 with approximately 30% loss of anterior height. No retropulsion of fracture fragments. No suspicious lytic or blastic lesions are identified.  IMPRESSION: 1. Progression emphysematous changes. 2. Interval development of right apical pleural collections. Findings favor small empyemas. 3. Increased airspace disease within the lower lobes bilaterally. 4. Interval development of air-fluid level within a cavitary lesion at the left lung base, likely infectious involvement of emphysematous changes. 5. New fluid levels within right lower lobe emphysematous changes. 6. Increased size of right upper lobe nodule adjacent to fibrosis related to previous lung cancer treatment. Findings are suspicious for recurrence. Nodule currently measures 0.8 x 1.0 cm.   Electronically Signed   By: Nolon Nations M.D.   On: 10/10/2014 17:30   Dg Chest Port 1 View  10/11/2014   CLINICAL DATA:  Pneumonia.  EXAM: PORTABLE CHEST - 1 VIEW  COMPARISON:  Chest x-ray 10/10/2014, 07/13/2014. Chest CT 10/10/2014 .  FINDINGS: Mediastinum and hilar structures are stable. Loculated pleural fluid/air collection present along the upper medial portion of the chest again noted, slight improvement. Previously identified loculated pleural fluid collection in the left base not identified. Persistent multifocal pulmonary infiltrates particularly from the right upper and right lower lobes again noted. Heart size stable. No acute bony abnormality.  IMPRESSION: 1. Loculated pleural fluid/air collection along the upper medial chest again noted. Slight improvement from prior exam. Loculated pleural fluid collection in the left lower chest no longer identified. 2.  Persistent multifocal pulmonary infiltrates, particularly prominent in the right upper and right lower lobes. No interim change.   Electronically Signed   By: Marcello Moores  Register   On: 10/11/2014 07:30    ASSESSMENT / PLAN:  Cavitary PNA/ infected bullae/ bullitis superimposed on underlying severe emphysema and probable recurrent stage IIIa lung cancer. Further c/b Loculated Apical Pleural Fluid Collection and ongoing tobacco abuse  Plan: Change to Augmentin, would complete at least 3-4 weeks therapy minimally or until radiographic resolution of fluid collections; have made him f/u appointment on Oct 27th. Would cont abx thru that time.  Fluid is not amenable to drainage.  Would not attempt as patient is high risk for creation of BP fistula, pneumothorax etc He would not be a surgical candidate  Smoking cessation counseling  Duoneb QID Dulera Oxygen as needed to support saturations 88-95%  Remote PE - hx of, on anticoagulation but pt reports he was recently told to stop.  INR on admit 1.03 Plan: Coumadin per Primary SVC  Failure to Thrive in setting of recurrent stage IIIa lung cancer  Protein Calorie Malnutrition  Doubt he is a candidate for further treatment  Plan: Ensure  Diet as tolerated  Consider nutrition input  See heme/onc as out-pt. Would not initiate new cancer treatment until evidence of bullitis and infection are resolved.   10/12/2014, 11:54 AM

## 2014-10-12 NOTE — Progress Notes (Addendum)
PROGRESS NOTE  Paul [name] LXB:[id number] DOB: [birth year] DOA: 10/10/2014 PCP: Junie Panning, NP  HPI/Recap of past 75 hours: 81 year old male with past medical history of stage IIIa non-small cell lung cancer status post chemoradiation treatment as well as advanced COPD and ongoing tobacco abuse admitted to the hospitalist service on 9/19 for shortness of breath and cough and found to have multilobar pneumonia with CT clarifying cavitary lesion pneumonia and increasing right upper nodule size concerning for lung cancer recurrence.  Patient treated with antibiotics, nebulizers. Today he is breathing much more comfortably with only mild wheezing.  Assessment/Plan: Principal Problem:   CAP (community acquired pneumonia)/cavitary pneumonia: Seen by pulmonary. Being treated as infectious with recommendations for 3 weeks of Augmentin and then repeat chest x-ray. Active Problems:   TOBACCO ABUSE: Nicotine patch. Patient advised to quit although he does not want to   Essential hypertension: Continue home meds   ALCOHOL ABUSE, HX OF: Stable    Lung cancer: Suspected recurrence. Will see Dr. Earlie Server, his oncologist as outpatient   Protein-calorie malnutrition, severe: Patient meets criteria in the context of chronic illness and is also underweight. Seem by nutrition. Started on ensure enlive twice a day  Dysphagia: On dysphagia 3 diet  Code Status: Full code  Family Communication: No family, spoke with patient's neighbor who helps care for him  Disposition Plan: Anticipate discharge tomorrow   Consultants:  Pulmonary  Procedures:  None  Antibiotics:  IV vancomycin 9/19-9/21  IV Zosyn 9/19-9/21  By mouth Augmentin 9/21-10/12   Objective: BP 121/56 mmHg  Pulse 78  Temp(Src) 98 F (36.7 C) (Oral)  Resp 18  Ht '5\' 11"'$  (1.803 m)  Wt 53 kg (116 lb 13.5 oz)  BMI 16.30 kg/m2  SpO2 97%  Intake/Output Summary (Last 24 hours) at 10/12/14 1344 Last data filed at  10/12/14 1100  Gross per 24 hour  Intake    450 ml  Output   1375 ml  Net   -925 ml   Filed Weights   10/11/14 1300  Weight: 53 kg (116 lb 13.5 oz)    Exam:   General:  Emaciated, no acute distress  Cardiovascular: Regular rhythm, borderline tachycardia  Respiratory: Decreased breath sounds throughout with bilateral end expiratory wheeze mild  Abdomen: Soft, nontender, nondistended, positive bowel sounds  Musculoskeletal: No clubbing or cyanosis or edema   Data Reviewed: Basic Metabolic Panel:  Recent Labs Lab 10/10/14 0940 10/11/14 0525 10/12/14 0515  NA 129* 131* 130*  K 4.4 4.2 4.0  CL 92* 96* 96*  CO2 '28 27 26  '$ GLUCOSE 127* 130* 106*  BUN '11 10 10  '$ CREATININE 0.48* 0.41* 0.40*  CALCIUM 9.0 8.2* 8.4*   Liver Function Tests: No results for input(s): AST, ALT, ALKPHOS, BILITOT, PROT, ALBUMIN in the last 168 hours. No results for input(s): LIPASE, AMYLASE in the last 168 hours. No results for input(s): AMMONIA in the last 168 hours. CBC:  Recent Labs Lab 10/10/14 0940 10/11/14 0525 10/12/14 0515  WBC 9.9 9.0 7.9  HGB 12.7* 11.4* 11.2*  HCT 38.9* 35.7* 34.4*  MCV 82.1 83.0 81.9  PLT 414* 379 354   Cardiac Enzymes:    Recent Labs Lab 10/10/14 0953  TROPONINI <0.03   BNP (last 3 results) No results for input(s): BNP in the last 8760 hours.  ProBNP (last 3 results) No results for input(s): PROBNP in the last 8760 hours.  CBG: No results for input(s): GLUCAP in the last 168 hours.  Recent Results (from  the past 240 hour(s))  Culture, blood (routine x 2)     Status: None (Preliminary result)   Collection Time: 10/10/14  1:14 PM  Result Value Ref Range Status   Specimen Description BLOOD LEFT ANTECUBITAL  Final   Special Requests BOTTLES DRAWN AEROBIC AND ANAEROBIC 5.5CC  Final   Culture   Final    NO GROWTH 2 DAYS Performed at [facility]    Report Status PENDING  Incomplete     Studies: No results found.  Scheduled Meds: .  amoxicillin-clavulanate  1 tablet Oral Q12H  . chlorhexidine  15 mL Mouth/Throat BID  . enoxaparin (LOVENOX) injection  40 mg Subcutaneous Q24H  . feeding supplement (ENSURE ENLIVE)  237 mL Oral TID BM  . gabapentin  300 mg Oral QPM  . ipratropium-albuterol  3 mL Nebulization QID  . mometasone-formoterol  2 puff Inhalation BID  . nicotine  21 mg Transdermal Daily  . phenytoin  300 mg Oral Daily  . predniSONE  20 mg Oral BID WC  . sodium chloride  3 mL Intravenous Q12H    Continuous Infusions:    Time spent: 25 minutes  [facility] Pager [phone]. If 7PM-7AM, please contact night-coverage at [email or web address], password [email or web address] 10/12/2014, 1:44 PM  LOS: 2 days

## 2014-10-12 NOTE — Progress Notes (Signed)
Physical Therapy Treatment Patient Details Name: QUAMERE MUSSELL MRN: [id number] DOB: [birth year] Today's Date: 10/12/2014    History of Present Illness Paul [name] is a 81 y.o. male with a past medical history of stage IIIa non-small cell lung cancer with disease recurrence in February 2007, H/O seizures,  history of pulmonary emboli, history of embolic stroke, on lifelong anticoagulation, chronic obstructive pulmonary disease ongoing tobacco abuse presenting to the emergency department 10/10/14  with complaints of shortness of breath. He states having generalized weakness, minimal by mouth intake, difficulties getting around at home over the past 2 weeks. He also reports anorexia having significant weight loss over the past several months. Also hematuria, sodium 129.    PT Comments    Pt required MAX encouragement and was easily agittated.  Assisted OOB amb in hallway with MAX c/o fatigue.  Very unsteady gait.  HIGH FALL RISK.   Follow Up Recommendations  SNF     Equipment Recommendations       Recommendations for Other Services       Precautions / Restrictions Precautions Precautions: Fall Precaution Comments: monitor BP. low Restrictions Weight Bearing Restrictions: No    Mobility  Bed Mobility Overal bed mobility: Modified Independent             General bed mobility comments: increased time  Transfers Overall transfer level: Needs assistance Equipment used: Rolling walker (2 wheeled) Transfers: Sit to/from [facility] Sit to Stand: Min guard         General transfer comment: cues for safety. patient requested to sit down due to feeling weak.   Ambulation/Gait Ambulation/Gait assistance: Min assist Ambulation Distance (Feet): 55 Feet Assistive device: Rolling walker (2 wheeled) Gait Pattern/deviations: Step-to pattern;Step-through pattern;Decreased stride length;Trunk flexed     General Gait Details: unsteady drunken gait drifting R and L.   MAX c/o weakness/fatigue.    Stairs            Wheelchair Mobility    Modified Rankin (Stroke Patients Only)       Balance                                    Cognition Arousal/Alertness: Awake/alert Behavior During Therapy: Agitated Overall Cognitive Status: No family/caregiver present to determine baseline cognitive functioning Area of Impairment: Orientation;Awareness;Safety/judgement               General Comments: "I don't feel good"    Exercises      General Comments        Pertinent Vitals/Pain Pain Assessment: No/denies pain    Home Living                      Prior Function            PT Goals (current goals can now be found in the care plan section) Progress towards PT goals: Progressing toward goals    Frequency  Min 3X/week    PT Plan      Co-evaluation             End of Session Equipment Utilized During Treatment: Gait belt Activity Tolerance: Patient limited by fatigue Patient left: in bed;with call bell/phone within reach;with bed alarm set     Time: 1020-1035 PT Time Calculation (min) (ACUTE ONLY): 15 min  Charges:  $Gait Training: 8-22 mins  G Codes:      Rica Koyanagi  PTA [facility]  Acute  Rehab Pager      [phone]

## 2014-10-12 NOTE — Progress Notes (Signed)
Pulse ox at rest is 99%, HR is 104, On ambulating pulse ox is 93%, HR 112. Will continue to monitor.

## 2014-10-13 ENCOUNTER — Emergency Department (HOSPITAL_COMMUNITY): Payer: Medicare Other

## 2014-10-13 ENCOUNTER — Inpatient Hospital Stay (HOSPITAL_COMMUNITY): Payer: Medicare Other

## 2014-10-13 ENCOUNTER — Encounter (HOSPITAL_COMMUNITY): Payer: Self-pay | Admitting: Emergency Medicine

## 2014-10-13 ENCOUNTER — Inpatient Hospital Stay (HOSPITAL_COMMUNITY)
Admission: EM | Admit: 2014-10-13 | Discharge: 2014-10-18 | Disposition: A | Discharge status: Skilled Nursing Facility | Payer: Medicare Other | Source: Home / Self Care | Attending: Internal Medicine | Admitting: Internal Medicine

## 2014-10-13 DIAGNOSIS — C3490 Malignant neoplasm of unspecified part of unspecified bronchus or lung: Secondary | ICD-10-CM | POA: Diagnosis present

## 2014-10-13 DIAGNOSIS — I10 Essential (primary) hypertension: Secondary | ICD-10-CM | POA: Diagnosis present

## 2014-10-13 DIAGNOSIS — R531 Weakness: Secondary | ICD-10-CM | POA: Insufficient documentation

## 2014-10-13 DIAGNOSIS — J869 Pyothorax without fistula: Secondary | ICD-10-CM | POA: Diagnosis not present

## 2014-10-13 DIAGNOSIS — R627 Adult failure to thrive: Secondary | ICD-10-CM | POA: Diagnosis present

## 2014-10-13 DIAGNOSIS — E43 Unspecified severe protein-calorie malnutrition: Secondary | ICD-10-CM | POA: Diagnosis present

## 2014-10-13 DIAGNOSIS — F17200 Nicotine dependence, unspecified, uncomplicated: Secondary | ICD-10-CM | POA: Diagnosis present

## 2014-10-13 DIAGNOSIS — J189 Pneumonia, unspecified organism: Secondary | ICD-10-CM | POA: Diagnosis present

## 2014-10-13 DIAGNOSIS — R55 Syncope and collapse: Secondary | ICD-10-CM | POA: Insufficient documentation

## 2014-10-13 DIAGNOSIS — I82409 Acute embolism and thrombosis of unspecified deep veins of unspecified lower extremity: Secondary | ICD-10-CM | POA: Diagnosis present

## 2014-10-13 DIAGNOSIS — I639 Cerebral infarction, unspecified: Secondary | ICD-10-CM | POA: Diagnosis present

## 2014-10-13 DIAGNOSIS — A419 Sepsis, unspecified organism: Secondary | ICD-10-CM

## 2014-10-13 DIAGNOSIS — G40209 Localization-related (focal) (partial) symptomatic epilepsy and epileptic syndromes with complex partial seizures, not intractable, without status epilepticus: Secondary | ICD-10-CM | POA: Diagnosis present

## 2014-10-13 DIAGNOSIS — R0602 Shortness of breath: Secondary | ICD-10-CM | POA: Diagnosis present

## 2014-10-13 DIAGNOSIS — Z515 Encounter for palliative care: Secondary | ICD-10-CM | POA: Insufficient documentation

## 2014-10-13 DIAGNOSIS — K219 Gastro-esophageal reflux disease without esophagitis: Secondary | ICD-10-CM | POA: Diagnosis present

## 2014-10-13 DIAGNOSIS — J984 Other disorders of lung: Secondary | ICD-10-CM

## 2014-10-13 DIAGNOSIS — R296 Repeated falls: Secondary | ICD-10-CM | POA: Diagnosis present

## 2014-10-13 DIAGNOSIS — W19XXXA Unspecified fall, initial encounter: Secondary | ICD-10-CM | POA: Diagnosis present

## 2014-10-13 LAB — URINALYSIS, ROUTINE W REFLEX MICROSCOPIC
Bilirubin Urine: NEGATIVE
Glucose, UA: NEGATIVE mg/dL
Ketones, ur: NEGATIVE mg/dL
LEUKOCYTES UA: NEGATIVE
Nitrite: NEGATIVE
PROTEIN: NEGATIVE mg/dL
Specific Gravity, Urine: 1.021 (ref 1.005–1.030)
UROBILINOGEN UA: 0.2 mg/dL (ref 0.0–1.0)
pH: 7.5 (ref 5.0–8.0)

## 2014-10-13 LAB — DIFFERENTIAL
BASOS ABS: 0 10*3/uL (ref 0.0–0.1)
BASOS PCT: 0 %
EOS ABS: 0 10*3/uL (ref 0.0–0.7)
Eosinophils Relative: 0 %
Lymphocytes Relative: 4 %
Lymphs Abs: 0.5 10*3/uL — ABNORMAL LOW (ref 0.7–4.0)
MONO ABS: 1 10*3/uL (ref 0.1–1.0)
MONOS PCT: 8 %
Neutro Abs: 11 10*3/uL — ABNORMAL HIGH (ref 1.7–7.7)
Neutrophils Relative %: 88 %

## 2014-10-13 LAB — COMPREHENSIVE METABOLIC PANEL
ALT: 16 U/L — ABNORMAL LOW (ref 17–63)
AST: 23 U/L (ref 15–41)
Albumin: 2 g/dL — ABNORMAL LOW (ref 3.5–5.0)
Alkaline Phosphatase: 97 U/L (ref 38–126)
Anion gap: 6 (ref 5–15)
BUN: 9 mg/dL (ref 6–20)
CHLORIDE: 100 mmol/L — AB (ref 101–111)
CO2: 28 mmol/L (ref 22–32)
Calcium: 8.5 mg/dL — ABNORMAL LOW (ref 8.9–10.3)
Creatinine, Ser: 0.49 mg/dL — ABNORMAL LOW (ref 0.61–1.24)
Glucose, Bld: 123 mg/dL — ABNORMAL HIGH (ref 65–99)
POTASSIUM: 4.2 mmol/L (ref 3.5–5.1)
Sodium: 134 mmol/L — ABNORMAL LOW (ref 135–145)
Total Bilirubin: 0.5 mg/dL (ref 0.3–1.2)
Total Protein: 5.7 g/dL — ABNORMAL LOW (ref 6.5–8.1)

## 2014-10-13 LAB — CBC
HEMATOCRIT: 35.2 % — AB (ref 39.0–52.0)
Hemoglobin: 11.4 g/dL — ABNORMAL LOW (ref 13.0–17.0)
MCH: 26.2 pg (ref 26.0–34.0)
MCHC: 32.4 g/dL (ref 30.0–36.0)
MCV: 80.9 fL (ref 78.0–100.0)
Platelets: 346 10*3/uL (ref 150–400)
RBC: 4.35 MIL/uL (ref 4.22–5.81)
RDW: 14.1 % (ref 11.5–15.5)
WBC: 12.6 10*3/uL — ABNORMAL HIGH (ref 4.0–10.5)

## 2014-10-13 LAB — PROTIME-INR
INR: 1.11 (ref 0.00–1.49)
INR: 1.16 (ref 0.00–1.49)
PROTHROMBIN TIME: 14.9 s (ref 11.6–15.2)
Prothrombin Time: 14.5 seconds (ref 11.6–15.2)

## 2014-10-13 LAB — URINE MICROSCOPIC-ADD ON

## 2014-10-13 LAB — APTT: APTT: 27 s (ref 24–37)

## 2014-10-13 MED ORDER — ENSURE ENLIVE PO LIQD: 237.0000 mL | Freq: Three times a day (TID) | ORAL | Status: DC

## 2014-10-13 MED ORDER — HYDROCODONE-ACETAMINOPHEN 5-325 MG PO TABS: 1.0000 | ORAL_TABLET | Freq: Four times a day (QID) | ORAL | Status: DC | PRN

## 2014-10-13 MED ORDER — MOMETASONE FURO-FORMOTEROL FUM 100-5 MCG/ACT IN AERO
2.0000 | INHALATION_SPRAY | Freq: Two times a day (BID) | RESPIRATORY_TRACT | Status: DC
  Administered 2014-10-15 – 2014-10-18 (×5): 2 via RESPIRATORY_TRACT
  Filled 2014-10-13: qty 8.8

## 2014-10-13 MED ORDER — PREDNISONE 10 MG PO TABS: ORAL_TABLET | ORAL | Status: DC

## 2014-10-13 MED ORDER — PHENYTOIN SODIUM EXTENDED 100 MG PO CAPS: 300.0000 mg | ORAL_CAPSULE | Freq: Every day | ORAL | Status: DC

## 2014-10-13 MED ORDER — HEPARIN SODIUM (PORCINE) 5000 UNIT/ML IJ SOLN
5000.0000 [IU] | Freq: Three times a day (TID) | INTRAMUSCULAR | Status: DC
  Administered 2014-10-14 – 2014-10-18 (×12): 5000 [IU] via SUBCUTANEOUS
  Filled 2014-10-13 (×13): qty 1

## 2014-10-13 MED ORDER — ACETAMINOPHEN 650 MG RE SUPP: 650.0000 mg | Freq: Four times a day (QID) | RECTAL | Status: DC | PRN

## 2014-10-13 MED ORDER — SODIUM CHLORIDE 0.9 % IV BOLUS (SEPSIS)
1500.0000 mL | Freq: Once | INTRAVENOUS | Status: DC
Start: 2014-10-13 — End: 2014-10-18

## 2014-10-13 MED ORDER — ACETAMINOPHEN 325 MG PO TABS: 650.0000 mg | ORAL_TABLET | Freq: Four times a day (QID) | ORAL | Status: DC | PRN

## 2014-10-13 MED ORDER — SODIUM CHLORIDE 0.9 % IJ SOLN
3.0000 mL | Freq: Two times a day (BID) | INTRAMUSCULAR | Status: DC
  Administered 2014-10-15 – 2014-10-17 (×4): 3 mL via INTRAVENOUS

## 2014-10-13 MED ORDER — AMOXICILLIN-POT CLAVULANATE 875-125 MG PO TABS: 1.0000 | ORAL_TABLET | Freq: Two times a day (BID) | ORAL | Status: DC

## 2014-10-13 MED ORDER — NICOTINE 21 MG/24HR TD PT24: 21.0000 mg | MEDICATED_PATCH | Freq: Every day | TRANSDERMAL | Status: DC

## 2014-10-13 MED ORDER — DM-GUAIFENESIN ER 30-600 MG PO TB12
1.0000 | ORAL_TABLET | Freq: Two times a day (BID) | ORAL | Status: DC
  Administered 2014-10-15 – 2014-10-18 (×6): 1 via ORAL
  Filled 2014-10-13 (×7): qty 1

## 2014-10-13 MED ORDER — AMOXICILLIN-POT CLAVULANATE 875-125 MG PO TABS
1.0000 | ORAL_TABLET | Freq: Two times a day (BID) | ORAL | Status: DC
  Administered 2014-10-15 – 2014-10-18 (×6): 1 via ORAL
  Filled 2014-10-13 (×7): qty 1

## 2014-10-13 MED ORDER — ALBUTEROL SULFATE (2.5 MG/3ML) 0.083% IN NEBU: 2.5000 mg | INHALATION_SOLUTION | RESPIRATORY_TRACT | Status: DC | PRN

## 2014-10-13 MED ORDER — TETANUS-DIPHTH-ACELL PERTUSSIS 5-2.5-18.5 LF-MCG/0.5 IM SUSP
0.5000 mL | Freq: Once | INTRAMUSCULAR | Status: AC
  Administered 2014-10-13: 0.5 mL via INTRAMUSCULAR
  Filled 2014-10-13: qty 0.5

## 2014-10-13 MED ORDER — ENSURE ENLIVE PO LIQD
237.0000 mL | Freq: Three times a day (TID) | ORAL | Status: DC
  Administered 2014-10-15 – 2014-10-18 (×6): 237 mL via ORAL
  Filled 2014-10-13 (×17): qty 237

## 2014-10-13 MED ORDER — PREDNISONE 20 MG PO TABS
20.0000 mg | ORAL_TABLET | Freq: Every day | ORAL | Status: DC
  Administered 2014-10-16 – 2014-10-18 (×3): 20 mg via ORAL
  Filled 2014-10-13 (×5): qty 1

## 2014-10-13 MED ORDER — SODIUM CHLORIDE 0.9 % IV SOLN
INTRAVENOUS | Status: DC
  Administered 2014-10-13: 20:00:00 via INTRAVENOUS

## 2014-10-13 MED ORDER — GABAPENTIN 300 MG PO CAPS
300.0000 mg | ORAL_CAPSULE | Freq: Every evening | ORAL | Status: DC
  Administered 2014-10-15 – 2014-10-17 (×2): 300 mg via ORAL
  Filled 2014-10-13 (×2): qty 1

## 2014-10-13 MED ORDER — SODIUM CHLORIDE 0.9 % IV SOLN
INTRAVENOUS | Status: DC
  Administered 2014-10-14: 04:00:00 via INTRAVENOUS

## 2014-10-13 MED ORDER — IPRATROPIUM-ALBUTEROL 0.5-2.5 (3) MG/3ML IN SOLN
3.0000 mL | Freq: Four times a day (QID) | RESPIRATORY_TRACT | Status: DC
Start: 2014-10-14 — End: 2014-10-14
  Filled 2014-10-13 (×2): qty 3

## 2014-10-13 MED ORDER — NICOTINE 21 MG/24HR TD PT24
21.0000 mg | MEDICATED_PATCH | Freq: Every day | TRANSDERMAL | Status: DC
  Administered 2014-10-14 – 2014-10-18 (×4): 21 mg via TRANSDERMAL
  Filled 2014-10-13 (×5): qty 1

## 2014-10-13 NOTE — Progress Notes (Signed)
Discharge instructions given to pt and family, verbalized understanding. Left the unit in stable condition.

## 2014-10-13 NOTE — H&P (Addendum)
[facility] History and Physical  AYAZ SONDGEROTH MWN:[id number] DOB: [birth year] DOA: 10/13/2014  Referring physician: ED physician PCP: Junie Panning, NP  Specialists:   Chief Complaint: Fall and left arm weakness.  HPI: Paul [name] is a 81 y.o. male with PMH of hypertension, GERD, tobacco abuse, COPD, complex partial seizure, non-small cell lung cancer, stroke, diastolic congestive heart failure, who presents with fall and possible syncope.  Patient was recently hospitalized from 9/19-9/22 and was treated for pneumonia. He was found to have multilobar pneumonia with CT clarifying cavitary lesion pneumonia and increasing right upper nodule size concerning for lung cancer recurrence. He refused SNIF placement at discharge. He was discharged on Augmentin for 3 weeks. Pt states he went to get up and walk when he fell and could not get up. Pt is not sure why he fell but he could not get up on his own.He has weakness over arm.  EMS arrived and found him complaint of pain in his left arm with skin tears. He denies headache. He  has mild shortness of breath and mild cough. No fever or chills. He does not have vision change or hearing loss. He does not have weakness in his legs. Patient does not have abdominal pain, diarrhea, symptoms of UTI.  In ED, patient was found to have INR 1.16, activity urinalysis, tachycardia, normal temperature, WBC 12.6, electrolytes okay. Dilantin level less than 2.5. CT head is negative for acute abnormalities. Chest x-ray of her left forearm is negative for bony fracture. Patient is admitted to inpatient for further evaluation and treatment.  Where does patient live?   At home    Can patient participate in ADLs?  None  Review of Systems:   General: no fevers, chills, no changes in body weight, has poor appetite, has fatigue HEENT: no blurry vision, hearing changes or sore throat Pulm: has dyspnea, coughing, no wheezing CV: no chest pain,  palpitations Abd: no nausea, vomiting, abdominal pain, diarrhea, constipation GU: no dysuria, burning on urination, increased urinary frequency, hematuria  Ext: no leg edema Neuro: has L arm weakness, no vision change or hearing loss Skin: no rash. Has skin tears over left forearm.  MSK: No muscle spasm, no deformity, no limitation of range of movement in spin Heme: No easy bruising.  Travel history: No recent long distant travel.  Allergy: No Known Allergies  Past Medical History  Diagnosis Date  . Hypertension   . COPD (chronic obstructive pulmonary disease)   . Hemorrhoids     external  . 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 GERD (gastroesophageal reflux disease)   . Emphysema   . HOH (hard of hearing)   . Pneumonia   . Seizures   . Headache(784.0)   . Non-small cell carcinoma of lung dx'd 2006    stage 3  . Lung cancer   . Stroke     Past Surgical History  Procedure Laterality Date  . Inner ear surgery    . Toe amputation Left   . Tonsillectomy    . Amputation Left 04/16/2013    Procedure: AMPUTATION RAY;  Surgeon: Newt Minion, MD;  Location: [place];  Service: Orthopedics;  Laterality: Left;  Left Foot 4th and 5th Ray Amputation  . 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  He has a 90 pack-year smoking history. He has never used smokeless tobacco. He reports that he does not drink alcohol or use illicit drugs.  Family History:  Family History  Problem Relation Age of Onset  . Heart failure Brother      Prior to Admission medications   Medication Sig Start Date End Date Taking? Authorizing Provider  amoxicillin-clavulanate (AUGMENTIN) 875-125 MG per tablet Take 1 tablet by mouth every 12 (twelve) hours. 10/13/14 11/02/14  Annita Brod, MD   feeding supplement, ENSURE ENLIVE, (ENSURE ENLIVE) LIQD Take 237 mLs by mouth 3 (three) times daily between meals. 10/13/14   Annita Brod, MD  Fluticasone-Salmeterol (ADVAIR) 250-50 MCG/DOSE AEPB Inhale 1 puff into the lungs 2 (two) times daily.    Historical Provider, MD  gabapentin (NEURONTIN) 300 MG capsule Take 300 mg by mouth every evening.    Historical Provider, MD  ipratropium-albuterol (DUONEB) 0.5-2.5 (3) MG/3ML SOLN Inhale 3 mL by nebulization every six (6) hours as needed. For shortness of breath/wheezing 07/06/14 07/06/15  Historical Provider, MD  nicotine (NICODERM CQ - DOSED IN MG/24 HOURS) 21 mg/24hr patch Place 1 patch (21 mg total) onto the skin daily. 10/13/14   Annita Brod, MD  phenytoin (DILANTIN) 100 MG ER capsule Take 300 mg by mouth daily.    Historical Provider, MD  predniSONE (DELTASONE) 10 MG tablet 20 mg daily x 2 days, then 10 mg daily x 3 days, then stop. 10/13/14   Annita Brod, MD  PROAIR HFA 108 (90 BASE) MCG/ACT inhaler INHALE 2 PUFFS EVERY 4-6 HOURS AS NEEDED FOR COUGH OR WHEEZING 08/29/14   Historical Provider, MD    Physical Exam: Filed Vitals:   10/13/14 2300 10/13/14 2315 10/13/14 2330 10/14/14 0100  BP: 112/64 128/87 143/88 136/71  Pulse: 86 99 94 94  Temp:    97.7 F (36.5 C)  TempSrc:    Axillary  Resp: '18 19 23   '$ Weight:    50.531 kg (111 lb 6.4 oz)  SpO2: 95% 91% 89%    General: Not in acute distress HEENT:       Eyes: PERRL, EOMI, no scleral icterus.       ENT: No discharge from the ears and nose, no pharynx injection, no tonsillar enlargement.        Neck: No JVD, no bruit, no mass felt. Heme: No neck lymph node enlargement. Cardiac: S1/S2, RRR, No murmurs, No gallops or rubs. Pulm: No rales, wheezing, rhonchi or rubs. Abd: Soft, nondistended, nontender, no rebound pain, no organomegaly, BS present. Ext: No pitting leg edema bilaterally. 2+DP/PT pulse bilaterally. Musculoskeletal: No joint deformities, No joint redness or  warmth, no limitation of ROM in spin. Skin: No rashes. Has skin tears over L forearm Neuro: Alert, oriented X3, cranial nerves II-XII grossly intact, muscle strength 2/5 in left arm, 5/5 in other extremities, sensation to light touch intact. Brachial reflex 1+ bilaterally. Knee reflex 1+ bilaterally. Negative Babinski's sign. Normal finger to nose test. Psych: Patient is not psychotic, no suicidal or hemocidal ideation.  Labs on Admission:  Basic Metabolic Panel:  Recent Labs Lab 10/10/14 0940 10/11/14 0525 10/12/14 0515 10/13/14 2000  NA 129* 131* 130* 134*  K 4.4 4.2 4.0 4.2  CL 92* 96* 96* 100*  CO2 '28 27 26 28  '$ GLUCOSE 127* 130* 106* 123*  BUN '11 10 10 9  '$ CREATININE 0.48* 0.41* 0.40* 0.49*  CALCIUM 9.0 8.2* 8.4*  8.5*   Liver Function Tests:  Recent Labs Lab 10/13/14 2000  AST 23  ALT 16*  ALKPHOS 97  BILITOT 0.5  PROT 5.7*  ALBUMIN 2.0*   No results for input(s): LIPASE, AMYLASE in the last 168 hours. No results for input(s): AMMONIA in the last 168 hours. CBC:  Recent Labs Lab 10/10/14 0940 10/11/14 0525 10/12/14 0515 10/13/14 2000  WBC 9.9 9.0 7.9 12.6*  NEUTROABS  --   --   --  11.0*  HGB 12.7* 11.4* 11.2* 11.4*  HCT 38.9* 35.7* 34.4* 35.2*  MCV 82.1 83.0 81.9 80.9  PLT 414* 379 354 346   Cardiac Enzymes:  Recent Labs Lab 10/10/14 0953  TROPONINI <0.03    BNP (last 3 results) No results for input(s): BNP in the last 8760 hours.  ProBNP (last 3 results) No results for input(s): PROBNP in the last 8760 hours.  CBG: No results for input(s): GLUCAP in the last 168 hours.  Radiological Exams on Admission: Dg Forearm Left  10/13/2014   CLINICAL DATA:  Fall with multiple skin tears midshaft, limited range of motion. Limited ability to cooperate.  EXAM: LEFT FOREARM - 2 VIEW  COMPARISON:  None.  FINDINGS: There is no evidence of fracture or other focal bone lesions. Soft tissues are unremarkable.  IMPRESSION: Negative.   Electronically Signed    By: Nolon Nations M.D.   On: 10/13/2014 19:24   Ct Head Wo Contrast  10/13/2014   CLINICAL DATA:  Unwitnessed fall.  EXAM: CT HEAD WITHOUT CONTRAST  TECHNIQUE: Contiguous axial images were obtained from the base of the skull through the vertex without intravenous contrast.  COMPARISON:  07/22/2014.  FINDINGS: Diffusely enlarged ventricles and subarachnoid spaces. Patchy white matter low density in both cerebral hemispheres. No skull fracture or intracranial hemorrhage. Right maxillary sinus air-fluid level and mild mucosal thickening. The mucosal thickening and significantly improved. Progressive right mastoid opacification.  IMPRESSION: 1. No skull fracture or intracranial hemorrhage. 2. Stable atrophy and chronic small vessel white matter ischemic changes. 3. Improved right maxillary sinusitis with an acute component today. 4. Mildly progressive right mastoid opacification.   Electronically Signed   By: Claudie Revering M.D.   On: 10/13/2014 19:48   Ct Angio Chest Pe W/cm &/or Wo Cm  10/14/2014   CLINICAL DATA:  Shortness of breath, syncope, and tachycardia. History of DVT.  EXAM: CT ANGIOGRAPHY CHEST WITH CONTRAST  TECHNIQUE: Multidetector CT imaging of the chest was performed using the standard protocol during bolus administration of intravenous contrast. Multiplanar CT image reconstructions and MIPs were obtained to evaluate the vascular anatomy.  CONTRAST:  71m OMNIPAQUE IOHEXOL 350 MG/ML SOLN  COMPARISON:  10/10/2014  FINDINGS: Technically adequate study with good opacification of the central and segmental pulmonary arteries. No focal filling defects. No evidence of significant pulmonary embolus.  Normal heart size. Normal caliber thoracic aorta. Eccentric noncalcified plaque formation in the lower descending aorta. No aortic dissection. Calcification in the aorta. Great vessel origins are patent. Focal stenosis of the proximal right carotid artery representing about 75% diameter reduction focally.  Distal flow is demonstrated. Esophagus is decompressed. Moderate-sized esophageal hiatal hernia. No significant lymphadenopathy in the chest.  Diffuse emphysematous changes throughout both lungs. Consolidation in the right upper lung with air bronchograms possibly representing changes related to known lung cancer. This could represent postobstructive change or post treatment change. Patchy airspace infiltrates in the right apex may be inflammatory consolidation or atelectasis in the right lung base is progressing since prior  study. Loculated right pleural fluid and gas collections anteriorly and in the base. These were present previously and could be due to infection or post treatment changes or perhaps less likely pleural metastasis. Mucoid material demonstrated in bilateral lower lobe bronchi. Area of cavitation versus emphysematous bulla with small air-fluid level in the left lung base is similar to prior study.  Included portions of the upper abdominal organs are grossly unremarkable. Or post obstructive enter similar to prior study.  Review of the MIP images confirms the above findings.  IMPRESSION: No evidence of significant pulmonary embolus. Diffuse emphysematous changes throughout the lungs. Areas of consolidation in the right upper lung and right lung base probably related to history of lung cancer. Changes may represent postobstructive consolidation or treatment changes. Patchy airspace disease in the right apex. Loculated gas and fluid collections in the right pleural space. Mucous plugging.   Electronically Signed   By: Lucienne Capers M.D.   On: 10/14/2014 02:45   Mr Brain Wo Contrast  10/14/2014   CLINICAL DATA:  Initial evaluation for acute syncope, fall. History of probable recurrent lung cancer.  EXAM: MRI HEAD WITHOUT CONTRAST  TECHNIQUE: Multiplanar, multiecho pulse sequences of the brain and surrounding structures were obtained without intravenous contrast.  COMPARISON:  Prior CT from  10/13/2014 as well as earlier studies.  FINDINGS: Generalized cerebral volume loss with chronic microvascular ischemic changes again seen. Remote cortical infarct within the right occipital lobe.  Multiple patchy cortical, subcortical, and periventricular ischemic infarcts seen involving the right frontal, parietal, and occipital lobes in a fairly linear watershed distribution. The largest of these foci involves the cortical gray matter in the anterior right frontal lobe (series 3, image 38). No associated hemorrhage or mass effect. Diminished flow void within the distal right vertebral artery, which may be related to slow flow or occlusion. Major intracranial flow voids otherwise maintained.  No acute or chronic intracranial hemorrhage. No mass lesion, midline shift, or mass effect. Ventricular prominence related to global parenchymal volume loss present without hydrocephalus. No extra-axial fluid collection.  Craniocervical junction within normal limits. Pituitary gland normal.  No acute abnormality about the orbits.  Moderate mucosal thickening within the right maxillary sinus. Paranasal sinuses are otherwise clear. Right mastoid effusion noted. Inner ear structures normal.  Bone marrow signal intensity within normal limits. Scalp soft tissues unremarkable.  IMPRESSION: 1. Patchy multi focal ischemic infarcts involving the right frontal, parietal, and occipital lobes, largely in a watershed type distribution. No associated hemorrhage or mass effect. 2. Abnormal flow void within the distal right vertebral artery, which may be related to slow flow or possibly occlusion. Otherwise preserved major intracranial flow voids. 3. Advanced cerebral atrophy with moderate chronic microvascular ischemic changes.   Electronically Signed   By: Jeannine Boga M.D.   On: 10/14/2014 01:20   Dg Chest Port 1 View  10/13/2014   CLINICAL DATA:  Sepsis. History of hypertension, COPD, lung cancer, stroke, emphysema, and  pneumonia.  EXAM: PORTABLE CHEST 1 VIEW  COMPARISON:  10/11/2014  FINDINGS: Normal heart size and pulmonary vascularity. Volume loss and scarring in the right lung with areas of probable cavitation and consolidation demonstrated in the right upper lung and right base. This is probably related to the patient's history of lung cancer and may represent residual mass lesions, post treatment changes, and/or postobstructive consolidation. Diffuse emphysematous changes and scattered fibrosis in both lungs. No acute change since the prior study.  IMPRESSION: Chronic changes in the right lung and diffuse emphysematous  changes with fibrosis in both lungs. No acute change since previous study.   Electronically Signed   By: Lucienne Capers M.D.   On: 10/13/2014 23:47    EKG: Independently reviewed.  Abnormal findings: QTC 487, right bundle blockage, which existed in previous EKG on 10/10/14  Assessment/Plan Principal Problem:   Falls Active Problems:   TOBACCO ABUSE   Essential hypertension   Acute thromboembolism of deep veins of lower extremity   GASTROESOPHAGEAL REFLUX DISEASE   CVA (cerebral infarction)   CAP (community acquired pneumonia)   Shortness of breath   FTT (failure to thrive) in adult   Lung cancer   Protein-calorie malnutrition, severe   Cavitary pneumonia   Complex partial seizure  Fall: Etiology is not clear, differential diagnoses include possible seizure given his subtherapeutic of Dilantin level and new stroke given left arm weakness. - will admit to tele bed  - Obtain MRI - Check carotid dopplers  - start ASA  - 2D transthoracic echocardiography  - PT/OT consult  Complex partial seizure: Patient is on Dilantin, but has subtherapeutic Dilantin level, <2.5. -will load with IV Dilantin, 1000 mg 1 -Continue home dose of Dilantin, 300 mg daily  CAP (community acquired pneumonia)/cavitary pneumonia: Patient seen by pulmonary in previous admission. Given cavitary area, he was  discharged on Augmentin for 3 weeks.  - will continue Augmentin -Breathing treatment: Dulera, DuoNeb, pro-air, oral prednisone - CTA-chest to r/o PE given hx of DVT and possible recurrent lung cancer -will get Procalcitonin and trend lactic acid levels -IVF: 1.5L of NS bolus in ED, followed by 100  Tobacco abuse: -Did counseling about importance of quitting smoking -Nicotine patch  HTN: Not on medications at home. -IV hydralazine when necessary  Lung cancer: Suspected recurrence.  -Follow up with Dr. Earlie Server outpatient  Protein-calorie malnutrition, severe:  -Ensure  Hx of CVA (cerebral infarction): -ASA -see above  Disposition Plan: Admit to inpatient. Patient refused SNF placement at the previous discharge. Obviously, he cannot take care of himself at home. I discussed with patient extensively, he agreed to go to SNF this time. -Will consult to social worker for [facility] placement   DVT ppx: SQ Heparin  Code Status: Full code Family Communication: None at bed side.       Date of Service 10/14/2014    Ivor Costa [facility] Pager [phone]  If 7PM-7AM, please contact night-coverage [email or web address] Password [facility] 10/14/2014, 3:25 AM

## 2014-10-13 NOTE — Clinical Social Work Placement (Signed)
   CLINICAL SOCIAL WORK PLACEMENT  NOTE  Date:  10/13/2014  Patient Details  Name: Paul [name] MRN: [id number] Date of Birth: 11-Apr-1930  Clinical Social Work is seeking post-discharge placement for this patient at the [place] level of care (*CSW will initial, date and re-position this form in  chart as items are completed):  Yes   Patient/family provided with [place] Work Department's list of facilities offering this level of care within the geographic area requested by the patient (or if unable, by the patient's family).  Yes   Patient/family informed of their freedom to choose among providers that offer the needed level of care, that participate in Medicare, Medicaid or managed care program needed by the patient, have an available bed and are willing to accept the patient.  Yes   Patient/family informed of [facility]'s ownership interest in [facility] and [facility], as well as of the fact that they are under no obligation to receive care at these facilities.  PASRR submitted to EDS on 10/13/14     PASRR number received on 10/13/14     Existing PASRR number confirmed on       FL2 transmitted to all facilities in geographic area requested by pt/family on 10/13/14     FL2 transmitted to all facilities within larger geographic area on       Patient informed that his/her managed care company has contracts with or will negotiate with certain facilities, including the following:        Yes   Patient/family informed of bed offers received.  Patient chooses bed at  (pt chose to return home with home health services)     Physician recommends and patient chooses bed at      Patient to be transferred to   on  .  Patient to be transferred to facility by       Patient family notified on   of transfer.  Name of family member notified:        PHYSICIAN       Additional Comment:     _______________________________________________ Ladell Pier, LCSW 10/13/2014, 12:42 PM

## 2014-10-13 NOTE — ED Provider Notes (Signed)
CSN: [id number]     Arrival date & time 10/13/14  1801 History   First MD Initiated Contact with Patient 10/13/14 1811     Chief Complaint  Patient presents with  . Fall   HPI Pt was just recently released from the hospital earlier this morning.  According to the discharge summary "81 year old male with past medical history of stage IIIa non-small cell lung cancer status post chemoradiation treatment as well as advanced COPD and ongoing tobacco abuse admitted to the hospitalist service on 9/19 for shortness of breath and cough and found to have multilobar pneumonia with CT clarifying cavitary lesion pneumonia and increasing right upper nodule size concerning for lung cancer recurrence."  When he left the hospital , skilled nursing was recommended but the patient refused.  Pt was home with his friendwhen she had to step out for a little bit.  He was left sitting in a chair smoking a cigarette.  Pt states he went to get up and walk when he fell and could not get up.  Pt is not sure why he fell but he could not get up on his own.  EMS arrived and found him complaint of pain in his left arm with skin tears.  He denies headache.  No neck pain.  No chest pain.  No vomiting or diarrhea.  Past Medical History  Diagnosis Date  . Hypertension   . COPD (chronic obstructive pulmonary disease)   . Hemorrhoids     external  . 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 GERD (gastroesophageal reflux disease)   . Emphysema   . HOH (hard of hearing)   . Pneumonia   . Seizures   . Headache(784.0)   . Non-small cell carcinoma of lung dx'd 2006    stage 3  . Lung cancer   . Stroke    Past Surgical History  Procedure Laterality Date  . Inner ear surgery    . Toe amputation Left   . Tonsillectomy    . Amputation Left 04/16/2013    Procedure: AMPUTATION RAY;  Surgeon: Newt Minion, MD;  Location: [place];  Service: Orthopedics;  Laterality: Left;  Left Foot 4th and 5th Ray Amputation  . 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 Heart failure Brother    Social History  Substance Use Topics  . Smoking status: Current Every Day Smoker -- 1.50 packs/day for 60 years    Types: Cigarettes  . Smokeless tobacco: Never Used  . Alcohol Use: No    Review of Systems  All other systems reviewed and are negative.     Allergies  Review of patient's allergies indicates no known allergies.  Home Medications   Prior to Admission medications   Medication Sig Start Date End Date Taking? Authorizing Provider  amoxicillin-clavulanate (AUGMENTIN) 875-125 MG per tablet Take 1 tablet by mouth every 12 (twelve) hours. 10/13/14 11/02/14  Annita Brod, MD  feeding supplement, ENSURE ENLIVE, (ENSURE ENLIVE) LIQD Take 237 mLs by mouth 3 (three) times daily between meals. 10/13/14   Annita Brod, MD  Fluticasone-Salmeterol (ADVAIR) 250-50 MCG/DOSE AEPB Inhale 1 puff into the lungs 2 (two) times daily.    Historical Provider, MD  gabapentin (NEURONTIN) 300 MG capsule Take  300 mg by mouth every evening.    Historical Provider, MD  ipratropium-albuterol (DUONEB) 0.5-2.5 (3) MG/3ML SOLN Inhale 3 mL by nebulization every six (6) hours as needed. For shortness of breath/wheezing 07/06/14 07/06/15  Historical Provider, MD  nicotine (NICODERM CQ - DOSED IN MG/24 HOURS) 21 mg/24hr patch Place 1 patch (21 mg total) onto the skin daily. 10/13/14   Annita Brod, MD  phenytoin (DILANTIN) 100 MG ER capsule Take 300 mg by mouth daily.    Historical Provider, MD  predniSONE (DELTASONE) 10 MG tablet 20 mg daily x 2 days, then 10 mg daily x 3 days, then stop. 10/13/14   Annita Brod, MD  PROAIR HFA 108 (90 BASE) MCG/ACT inhaler INHALE 2 PUFFS EVERY 4-6 HOURS AS NEEDED FOR COUGH OR WHEEZING 08/29/14   Historical  Provider, MD   BP 151/72 mmHg  Pulse 98  Resp 19  Wt 116 lb (52.617 kg)  SpO2 95% Physical Exam  Constitutional: No distress.  Elderly, frail  HENT:  Head: Normocephalic and atraumatic.  Right Ear: External ear normal.  Left Ear: External ear normal.  Eyes: Conjunctivae are normal. Right eye exhibits no discharge. Left eye exhibits no discharge. No scleral icterus.  Neck: Neck supple. No tracheal deviation present.  Cervical spine nontender  Cardiovascular: Normal rate, regular rhythm and intact distal pulses.   Pulmonary/Chest: Effort normal and breath sounds normal. No stridor. No respiratory distress. He has no wheezes. He has no rales.  Abdominal: Soft. Bowel sounds are normal. He exhibits no distension. There is no tenderness. There is no rebound and no guarding.  Musculoskeletal: He exhibits tenderness. He exhibits no edema.       Left forearm: He exhibits bony tenderness. He exhibits no swelling and no deformity.  Thoracic and lumbar spine nontender; extensive superficial skin tears left forearm  Neurological: He is alert. No cranial nerve deficit (no facial droop, extraocular movements intact, no slurred speech) or sensory deficit. He exhibits normal muscle tone. He displays no seizure activity. Coordination normal.  Patient is able to move all extremities, generalized weakness, unable to sit up in bed without assistance  Skin: Skin is warm and dry. No rash noted.  Psychiatric: He has a normal mood and affect.  Nursing note and vitals reviewed.   ED Course  Procedures (including critical care time) Labs Review Labs Reviewed  CBC - Abnormal; Notable for the following:    WBC 12.6 (*)    Hemoglobin 11.4 (*)    HCT 35.2 (*)    All other components within normal limits  DIFFERENTIAL - Abnormal; Notable for the following:    Neutro Abs 11.0 (*)    Lymphs Abs 0.5 (*)    All other components within normal limits  COMPREHENSIVE METABOLIC PANEL - Abnormal; Notable for the  following:    Sodium 134 (*)    Chloride 100 (*)    Glucose, Bld 123 (*)    Creatinine, Ser 0.49 (*)    Calcium 8.5 (*)    Total Protein 5.7 (*)    Albumin 2.0 (*)    ALT 16 (*)    All other components within normal limits  URINALYSIS, ROUTINE W REFLEX MICROSCOPIC (NOT AT [facility]) - Abnormal; Notable for the following:    Hgb urine dipstick SMALL (*)    All other components within normal limits  URINE MICROSCOPIC-ADD ON - Abnormal; Notable for the following:    Crystals CA OXALATE CRYSTALS (*)    All other components within normal  limits  PROTIME-INR  APTT  ETHANOL  I-STAT TROPOININ, ED    Imaging Review Dg Forearm Left  10/13/2014   CLINICAL DATA:  Fall with multiple skin tears midshaft, limited range of motion. Limited ability to cooperate.  EXAM: LEFT FOREARM - 2 VIEW  COMPARISON:  None.  FINDINGS: There is no evidence of fracture or other focal bone lesions. Soft tissues are unremarkable.  IMPRESSION: Negative.   Electronically Signed   By: Nolon Nations M.D.   On: 10/13/2014 19:24   Ct Head Wo Contrast  10/13/2014   CLINICAL DATA:  Unwitnessed fall.  EXAM: CT HEAD WITHOUT CONTRAST  TECHNIQUE: Contiguous axial images were obtained from the base of the skull through the vertex without intravenous contrast.  COMPARISON:  07/22/2014.  FINDINGS: Diffusely enlarged ventricles and subarachnoid spaces. Patchy white matter low density in both cerebral hemispheres. No skull fracture or intracranial hemorrhage. Right maxillary sinus air-fluid level and mild mucosal thickening. The mucosal thickening and significantly improved. Progressive right mastoid opacification.  IMPRESSION: 1. No skull fracture or intracranial hemorrhage. 2. Stable atrophy and chronic small vessel white matter ischemic changes. 3. Improved right maxillary sinusitis with an acute component today. 4. Mildly progressive right mastoid opacification.   Electronically Signed   By: Claudie Revering M.D.   On: 10/13/2014 19:48   I  have personally reviewed and evaluated these images and lab results as part of my medical decision-making.   EKG Interpretation   Date/Time:  Thursday October 13 2014 18:21:09 EDT Ventricular Rate:  103 PR Interval:  153 QRS Duration: 127 QT Interval:  372 QTC Calculation: 487 R Axis:   87 Text Interpretation:  Sinus tachycardia Multiform ventricular premature  complexes Right bundle branch block ST elevation, consider inferior injury  No significant change since last tracing Confirmed by Britnie Colville  MD-J, Germaine Ripp  ([id number]) on 10/13/2014 7:16:33 PM      MDM   Final diagnoses:  Weakness  Syncope, unspecified syncope type   Pt was in the hospital today and discharged for his pneumoina, and probable recurrent lung CA.  Pt had a sycnopal episode at home and injured his arm.  Local wound care provided by nursing.  His arm is not amenable to suture and there is some missing tissue.  Will need wound care follow up.  Pt initially did not want nursing home care.  He is agreeable to that now.  Will consult with medical service for observation regarding his syncope and weakness.  Will need social work/ case management.    Dorie Rank, MD 10/13/14 2206

## 2014-10-13 NOTE — Clinical Social Work Note (Signed)
Clinical Social Work Assessment  Patient Details  Name: Paul [name] MRN: [id number] Date of Birth: May 01, 1930  Date of referral:  10/13/14               Reason for consult:  Discharge Planning                Permission sought to share information with:  Family Supports Permission granted to share information::  Yes, Verbal Permission Granted  Name::     Paul [name]  Agency::     Relationship::  niece  Contact Information:  [phone]  Housing/Transportation Living arrangements for the past 2 months:  Apartment Source of Information:  Patient, Other (Comment Required) (pt niece) Patient Interpreter Needed:  None Criminal Activity/Legal Involvement Pertinent to Current Situation/Hospitalization:  No - Comment as needed Significant Relationships:  Other(Comment) (niece) Lives with:  Self Do you feel safe going back to the place where you live?  Yes Need for family participation in patient care:  Yes (Comment)  Care giving concerns:  Pt admitted from home alone. PT recommending SNF.   Social Worker assessment / plan:  CSW received referral for New SNF.   CSW met with pt at bedside. CSW introduced self and explained role. Pt reports that he lives alone in an apartment. CSW discussed recommendation from PT for short term rehab. Pt discussed that he prefers to go home. Pt states that he is open to having the options, but wants to go home first and then decide if he wants to go to rehab. Pt asked CSW to get pt niece, Olin Hauser on the phone. CSW contacted pt niece, Olin Hauser. Pt niece stated that she anticipated that pt would not agree to short term rehab. Pt niece stated that she can assist with ensuring that pt has his medications and taking medications, but does feel that home health would be helpful. CSW discussed that pt has 30 days to admit to a rehab facility from home if pt chooses to do so.   CSW completed FL2 and did SNF search. CSW provided bed offers to pt in order for pt to have  options available for if he decides to go to rehab from home.   CSW notified RNCM and MD that pt declined SNF and RNCM plans to assist to arrange home health care.   Pt niece plans to come to the hospital to transport pt to home.  No further social work needs identified at this time.  CSW signing off.  Employment status:  Retired Forensic scientist:  Medicare PT Recommendations:  [place] / Referral to community resources:  Other (Comment Required) (home health chosen)  Patient/Family's Response to care:  Pt alert and oriented x 3. Pt very hard of hearing. Pt niece supportive of pt. Pt wants to return home and agreeable to home health and will decide from home if he needs rehab at [facility].   Patient/Family's Understanding of and Emotional Response to Diagnosis, Current Treatment, and Prognosis:  Pt discussed with CSW what MD had discussed with pt and pt very eager to return home.   Emotional Assessment Appearance:  Appears stated age Attitude/Demeanor/Rapport:  Other (appropriate) Affect (typically observed):  Appropriate, Anxious Orientation:  Oriented to Self, Oriented to Place, Oriented to  Time, Oriented to Situation Alcohol / Substance use:  Not Applicable Psych involvement (Current and /or in the community):  No (Comment)  Discharge Needs  Concerns to be addressed:  Discharge Planning Concerns Readmission within the last 30 days:  No Current discharge risk:  Lives alone Barriers to Discharge:  No Barriers Identified  CSW signing off.   Alison Murray A, LCSW 10/13/2014, 12:27 PM  [phone]

## 2014-10-13 NOTE — ED Notes (Signed)
Patient at radiology. Will complete blood work and EKG when patient arrives back in room

## 2014-10-13 NOTE — ED Notes (Signed)
Niu, MD at bedside. °

## 2014-10-13 NOTE — Discharge Summary (Addendum)
Discharge Summary  Paul [name] NGE:[id number] DOB: February 19, 1930  PCP: Junie Panning, NP  Admit date: 10/10/2014 Discharge date: 10/13/2014  Time spent: 25 minutes  Recommendations for Outpatient Follow-up:  1. New medication: Augmentin 875 mg by mouth twice a day until 10/12 2. Patient will follow with pulmonary in 3 weeks to assess pneumonia treatment, chest x-ray prior 3. Patient will follow-up with oncology, Dr. Earlie Server in the next 2-4 weeks 4. New medication: Quick steroids taper  Discharge Diagnoses:  Active Hospital Problems   Diagnosis Date Noted  . CAP (community acquired pneumonia) 10/10/2014  . Protein-calorie malnutrition, severe 10/12/2014  . Cavitary pneumonia 10/12/2014  . Lung cancer   . Shortness of breath 10/10/2014  . FTT (failure to thrive) in adult 10/10/2014  . ALCOHOL ABUSE, HX OF 01/02/2006  . TOBACCO ABUSE 01/02/2006  . Essential hypertension 01/02/2006    Resolved Hospital Problems   Diagnosis Date Noted Date Resolved  . Community acquired pneumonia 10/10/2014 10/12/2014    Discharge Condition: Improved, being discharged home  Diet recommendation: Low-sodium with an sure supplement twice a day  Filed Weights   10/11/14 1300  Weight: 53 kg (116 lb 13.5 oz)    History of present illness:  81 year old male with past medical history of stage IIIa non-small cell lung cancer status post chemoradiation treatment as well as advanced COPD and ongoing tobacco abuse admitted to the hospitalist service on 9/19 for shortness of breath and cough and found to have multilobar pneumonia with CT clarifying cavitary lesion pneumonia and increasing right upper nodule size concerning for lung cancer recurrence.  Hospital Course:  Principal Problem:   CAP (community acquired pneumonia)/cavitary pneumonia/pyothorax without fistula: Patient seen by pulmonary. Given cavitary area, being treated as infectious with recommendations for 3 weeks of Augmentin plus  repeat chest x-ray following and follow-up pulmonary.  This was not felt to be an empyema. Active Problems:   TOBACCO ABUSE: On nicotine patch. Patient advised to quit although he does not want to   Essential hypertension: Stable, continue home medications   ALCOHOL ABUSE, HX OF  stable. Patient counseled although he does relapse frequently    FTT (failure to thrive) in adult: Seen by PT and short-term skilled nursing recommended which patient declined   Lung cancer: Suspected recurrence. Follow up with Dr. Earlie Server outpatient   Protein-calorie malnutrition, severe: Patient meets criteria and the context of chronic illness plus underweight. Seen by nutrition. Started on ensure enlive twice a day Dysphagia: On dysphagia 3 diet   Procedures:  None  Consultations:  Pulmonary  Discharge Exam: BP 143/65 mmHg  Pulse 93  Temp(Src) 98.2 F (36.8 C) (Oral)  Resp 20  Ht '5\' 11"'$  (1.803 m)  Wt 53 kg (116 lb 13.5 oz)  BMI 16.30 kg/m2  SpO2 97%  General: Alert and oriented 3, no acute distress Cardiovascular: Regular rate and rhythm, S1-S2 Respiratory: Decreased per sounds throughout, minimal expiratory wheezing  Discharge Instructions You were cared for by a hospitalist during your hospital stay. If you have any questions about your discharge medications or the care you received while you were in the hospital after you are discharged, you can call the unit and asked to speak with the hospitalist on call if the hospitalist that took care of you is not available. Once you are discharged, your primary care physician will handle any further medical issues. Please note that NO REFILLS for any discharge medications will be authorized once you are discharged, as it is imperative that you  return to your primary care physician (or establish a relationship with a primary care physician if you do not have one) for your aftercare needs so that they can reassess your need for medications and monitor your  lab values.  Discharge Instructions    DIET DYS 3    Complete by:  As directed   Fluid consistency:  Thin     Increase activity slowly    Complete by:  As directed             Medication List    STOP taking these medications        hydrocortisone 2.5 % cream      TAKE these medications        amoxicillin-clavulanate 875-125 MG per tablet  Commonly known as:  AUGMENTIN  Take 1 tablet by mouth every 12 (twelve) hours.     feeding supplement (ENSURE ENLIVE) Liqd  Take 237 mLs by mouth 3 (three) times daily between meals.     Fluticasone-Salmeterol 250-50 MCG/DOSE Aepb  Commonly known as:  ADVAIR  Inhale 1 puff into the lungs 2 (two) times daily.     gabapentin 300 MG capsule  Commonly known as:  NEURONTIN  Take 300 mg by mouth every evening.     ipratropium-albuterol 0.5-2.5 (3) MG/3ML Soln  Commonly known as:  DUONEB  Inhale 3 mL by nebulization every six (6) hours as needed. For shortness of breath/wheezing     nicotine 21 mg/24hr patch  Commonly known as:  NICODERM CQ - dosed in mg/24 hours  Place 1 patch (21 mg total) onto the skin daily.     phenytoin 100 MG ER capsule  Commonly known as:  DILANTIN  Take 300 mg by mouth daily.     predniSONE 10 MG tablet  Commonly known as:  DELTASONE  20 mg daily x 2 days, then 10 mg daily x 3 days, then stop.     PROAIR HFA 108 (90 BASE) MCG/ACT inhaler  Generic drug:  albuterol  INHALE 2 PUFFS EVERY 4-6 HOURS AS NEEDED FOR COUGH OR WHEEZING       No Known Allergies     Follow-up Information    Follow up with Simonne Maffucci, MD On 11/17/2014.   Specialty:  Pulmonary Disease   Why:  345 pm (report at 330 for chest xray)_   Contact information:   [place] [address] [phone]        The results of significant diagnostics from this hospitalization (including imaging, microbiology, ancillary and laboratory) are listed below for reference.    Significant Diagnostic Studies: Dg Chest 2  View  10/10/2014   CLINICAL DATA:  Shortness of breath, productive cough.  EXAM: CHEST  2 VIEW  COMPARISON:  07/13/2014  FINDINGS: Patchy airspace disease throughout the right lung, increasing since prior study. There is likely chronic underlying scarring, but findings are concerning for superimposed infiltrate/ pneumonia. Scarring noted in the left lung base. Possible early patchy opacity in the left upper lobe. Underlying COPD. Heart is borderline in size. No effusions.  IMPRESSION: Increasing patchy airspace disease throughout the right lung, likely pneumonia superimposed on chronic scarring. New patchy opacity in the left upper lobe could reflect early pneumonia as well.  COPD.   Electronically Signed   By: Rolm Baptise M.D.   On: 10/10/2014 10:54   Ct Chest W Contrast  10/10/2014   CLINICAL DATA:  Worsening shortness of breath associated with productive cough. Yellow sputum over the past week.  History of stage IIIA non-small cell lung cancer diagnosed in November 2005. Disease recurrence in fibroid 2007. Chemo radiation therapy. History of pulmonary emboli knee, stroke, anticoagulation, chronic obstructive pulmonary disease. Tobacco use.  EXAM: CT CHEST WITH CONTRAST  TECHNIQUE: Multidetector CT imaging of the chest was performed during intravenous contrast administration.  CONTRAST:  36m OMNIPAQUE IOHEXOL 300 MG/ML  SOLN  COMPARISON:  CT of the chest on 12/25/2012  FINDINGS: Heart: Heart is normal in size. There is significant coronary artery calcification. No pericardial effusion.  Vascular structures: There is atherosclerotic calcification of the thoracic aorta. Numerous foci of mural plaques, especially at the diaphragmatic hiatus. No evidence for aneurysm or dissection. The larger pulmonary arteries are grossly well opacified. Study was not performed for evaluation of pulmonary embolus.  Mediastinum/thyroid: A 6 mm right thyroid nodule is noted. Subcarinal lymph node is 1.5 cm.  Lungs/Airways: There  is significant severe emphysema, progressed since previous exam. Significant fibrosis is again identified within the posterior right upper lobe. An adjacent nodule measures 10 x 8 mm. Previously this measured 5 x 9 mm. There is a new cavitary lesion within the left lung base measuring 6.0 x 3.3 cm. Small amount of fluid is identified within the right lower lobe cystic spaces. There is airspace disease involving the lower lobes bilaterally, increased since the prior study.  There are rim enhancing air-fluid collections within the pleural space on the right. The larger is approximately 6.3 x 1.6 x 14 cm. The more medial and smaller collection is 2.7 x 1.0 by 4.3 cm. This  Upper abdomen: Large hiatal hernia is present. There is marked atherosclerotic calcification of the visualized abdominal aorta.  Chest wall/osseous structures: There has been interval wedge compression fracture of T5 with approximately 30% loss of anterior height. No retropulsion of fracture fragments. No suspicious lytic or blastic lesions are identified.  IMPRESSION: 1. Progression emphysematous changes. 2. Interval development of right apical pleural collections. Findings favor small empyemas. 3. Increased airspace disease within the lower lobes bilaterally. 4. Interval development of air-fluid level within a cavitary lesion at the left lung base, likely infectious involvement of emphysematous changes. 5. New fluid levels within right lower lobe emphysematous changes. 6. Increased size of right upper lobe nodule adjacent to fibrosis related to previous lung cancer treatment. Findings are suspicious for recurrence. Nodule currently measures 0.8 x 1.0 cm.   Electronically Signed   By: ENolon NationsM.D.   On: 10/10/2014 17:30   Dg Chest Port 1 View  10/11/2014   CLINICAL DATA:  Pneumonia.  EXAM: PORTABLE CHEST - 1 VIEW  COMPARISON:  Chest x-ray 10/10/2014, 07/13/2014. Chest CT 10/10/2014 .  FINDINGS: Mediastinum and hilar structures are  stable. Loculated pleural fluid/air collection present along the upper medial portion of the chest again noted, slight improvement. Previously identified loculated pleural fluid collection in the left base not identified. Persistent multifocal pulmonary infiltrates particularly from the right upper and right lower lobes again noted. Heart size stable. No acute bony abnormality.  IMPRESSION: 1. Loculated pleural fluid/air collection along the upper medial chest again noted. Slight improvement from prior exam. Loculated pleural fluid collection in the left lower chest no longer identified. 2. Persistent multifocal pulmonary infiltrates, particularly prominent in the right upper and right lower lobes. No interim change.   Electronically Signed   By: TMarcello Moores Register   On: 10/11/2014 07:30    Microbiology: Recent Results (from the past 240 hour(s))  Culture, blood (routine x 2)  Status: None (Preliminary result)   Collection Time: 10/10/14  1:14 PM  Result Value Ref Range Status   Specimen Description BLOOD LEFT ANTECUBITAL  Final   Special Requests BOTTLES DRAWN AEROBIC AND ANAEROBIC 5.5CC  Final   Culture   Final    NO GROWTH 3 DAYS Performed at [facility]    Report Status PENDING  Incomplete     Labs: Basic Metabolic Panel:  Recent Labs Lab 10/10/14 0940 10/11/14 0525 10/12/14 0515  NA 129* 131* 130*  K 4.4 4.2 4.0  CL 92* 96* 96*  CO2 '28 27 26  '$ GLUCOSE 127* 130* 106*  BUN '11 10 10  '$ CREATININE 0.48* 0.41* 0.40*  CALCIUM 9.0 8.2* 8.4*   Liver Function Tests: No results for input(s): AST, ALT, ALKPHOS, BILITOT, PROT, ALBUMIN in the last 168 hours. No results for input(s): LIPASE, AMYLASE in the last 168 hours. No results for input(s): AMMONIA in the last 168 hours. CBC:  Recent Labs Lab 10/10/14 0940 10/11/14 0525 10/12/14 0515  WBC 9.9 9.0 7.9  HGB 12.7* 11.4* 11.2*  HCT 38.9* 35.7* 34.4*  MCV 82.1 83.0 81.9  PLT 414* 379 354   Cardiac Enzymes:  Recent  Labs Lab 10/10/14 0953  TROPONINI <0.03   BNP: BNP (last 3 results) No results for input(s): BNP in the last 8760 hours.  ProBNP (last 3 results) No results for input(s): PROBNP in the last 8760 hours.  CBG: No results for input(s): GLUCAP in the last 168 hours.     Signed:  Annita Brod  [facility] 10/13/2014, 5:39 PM

## 2014-10-13 NOTE — ED Notes (Signed)
Pts caregiver Salvadore Dom) called to speak with this nurse regarding patients care and status- pt verbalized consent; Pam requests that patient not be sent home d/t patients inability to pay electric bill and currently has no power; Pam also requests that patient be discharged to [facility] or assisted living facility; Jeannene Patella is requesting an telephoned update regarding patients care when results are in

## 2014-10-13 NOTE — Progress Notes (Signed)
[id number] notified of hhc needs/Rhonda Davis,RN,BSN,CCM

## 2014-10-13 NOTE — ED Notes (Signed)
Pt was just d'cd from [facility] earlier today with dx of pneumonia.  Pt lives alone.  Friend saw him earlier and he was sitting in chair smoking a cig.   St's she went back to check on him later and found him on the floor.  Pt st's he does not remember falling.  Pt has skin tears to left forearm and c/o pain to left arm.  Pt is not on any blood thinners

## 2014-10-14 ENCOUNTER — Inpatient Hospital Stay (HOSPITAL_COMMUNITY): Payer: Medicare Other

## 2014-10-14 ENCOUNTER — Inpatient Hospital Stay (HOSPITAL_BASED_OUTPATIENT_CLINIC_OR_DEPARTMENT_OTHER): Payer: Medicare Other

## 2014-10-14 ENCOUNTER — Encounter (HOSPITAL_COMMUNITY): Payer: Self-pay

## 2014-10-14 ENCOUNTER — Other Ambulatory Visit (HOSPITAL_COMMUNITY): Payer: Medicare Other

## 2014-10-14 ENCOUNTER — Inpatient Hospital Stay (HOSPITAL_COMMUNITY)
Admit: 2014-10-14 | Discharge: 2014-10-14 | Disposition: A | Discharge status: Home / Self Care | Payer: Medicare Other | Attending: Internal Medicine | Admitting: Internal Medicine

## 2014-10-14 DIAGNOSIS — Z720 Tobacco use: Secondary | ICD-10-CM

## 2014-10-14 DIAGNOSIS — I6340 Cerebral infarction due to embolism of unspecified cerebral artery: Secondary | ICD-10-CM

## 2014-10-14 DIAGNOSIS — I639 Cerebral infarction, unspecified: Secondary | ICD-10-CM

## 2014-10-14 DIAGNOSIS — I10 Essential (primary) hypertension: Secondary | ICD-10-CM

## 2014-10-14 DIAGNOSIS — E43 Unspecified severe protein-calorie malnutrition: Secondary | ICD-10-CM

## 2014-10-14 DIAGNOSIS — I63411 Cerebral infarction due to embolism of right middle cerebral artery: Secondary | ICD-10-CM

## 2014-10-14 DIAGNOSIS — G40209 Localization-related (focal) (partial) symptomatic epilepsy and epileptic syndromes with complex partial seizures, not intractable, without status epilepticus: Secondary | ICD-10-CM

## 2014-10-14 DIAGNOSIS — C3431 Malignant neoplasm of lower lobe, right bronchus or lung: Secondary | ICD-10-CM

## 2014-10-14 DIAGNOSIS — J189 Pneumonia, unspecified organism: Secondary | ICD-10-CM

## 2014-10-14 DIAGNOSIS — I6789 Other cerebrovascular disease: Secondary | ICD-10-CM

## 2014-10-14 DIAGNOSIS — J984 Other disorders of lung: Secondary | ICD-10-CM

## 2014-10-14 DIAGNOSIS — R4182 Altered mental status, unspecified: Secondary | ICD-10-CM | POA: Insufficient documentation

## 2014-10-14 LAB — BASIC METABOLIC PANEL
Anion gap: 8 (ref 5–15)
BUN: 6 mg/dL (ref 6–20)
CALCIUM: 8.6 mg/dL — AB (ref 8.9–10.3)
CO2: 29 mmol/L (ref 22–32)
Chloride: 94 mmol/L — ABNORMAL LOW (ref 101–111)
Creatinine, Ser: 0.6 mg/dL — ABNORMAL LOW (ref 0.61–1.24)
GFR calc Af Amer: >60 mL/min (ref >60)
GLUCOSE: 112 mg/dL — AB (ref 65–99)
Potassium: 4.1 mmol/L (ref 3.5–5.1)
Sodium: 131 mmol/L — ABNORMAL LOW (ref 135–145)

## 2014-10-14 LAB — LACTIC ACID, PLASMA
LACTIC ACID, VENOUS: 1.1 mmol/L (ref 0.5–2.0)
LACTIC ACID, VENOUS: 2 mmol/L (ref 0.5–2.0)

## 2014-10-14 LAB — BRAIN NATRIURETIC PEPTIDE: B NATRIURETIC PEPTIDE 5: 317.4 pg/mL — AB (ref 0.0–100.0)

## 2014-10-14 LAB — CBC
HCT: 39.6 % (ref 39.0–52.0)
Hemoglobin: 12.5 g/dL — ABNORMAL LOW (ref 13.0–17.0)
MCH: 26 pg (ref 26.0–34.0)
MCHC: 31.6 g/dL (ref 30.0–36.0)
MCV: 82.5 fL (ref 78.0–100.0)
Platelets: 354 10*3/uL (ref 150–400)
RBC: 4.8 MIL/uL (ref 4.22–5.81)
RDW: 14.4 % (ref 11.5–15.5)
WBC: 9.9 10*3/uL (ref 4.0–10.5)

## 2014-10-14 LAB — PHENYTOIN LEVEL, TOTAL
PHENYTOIN LVL: 17 ug/mL (ref 10.0–20.0)
Phenytoin Lvl: <2.5 ug/mL — ABNORMAL LOW (ref 10.0–20.0)

## 2014-10-14 LAB — ETHANOL

## 2014-10-14 LAB — PROCALCITONIN: Procalcitonin: 0.14 ng/mL

## 2014-10-14 LAB — GLUCOSE, CAPILLARY: GLUCOSE-CAPILLARY: 132 mg/dL — AB (ref 65–99)

## 2014-10-14 MED ORDER — IPRATROPIUM-ALBUTEROL 0.5-2.5 (3) MG/3ML IN SOLN
3.0000 mL | Freq: Three times a day (TID) | RESPIRATORY_TRACT | Status: DC
  Administered 2014-10-14 – 2014-10-15 (×2): 3 mL via RESPIRATORY_TRACT
  Filled 2014-10-14 (×3): qty 3

## 2014-10-14 MED ORDER — ASPIRIN 300 MG RE SUPP
300.0000 mg | Freq: Every day | RECTAL | Status: DC
  Administered 2014-10-14: 300 mg via RECTAL
  Filled 2014-10-14: qty 1

## 2014-10-14 MED ORDER — ASPIRIN 325 MG PO TABS
325.0000 mg | ORAL_TABLET | Freq: Every day | ORAL | Status: DC
  Administered 2014-10-16: 325 mg via ORAL
  Filled 2014-10-14 (×2): qty 1

## 2014-10-14 MED ORDER — IOHEXOL 350 MG/ML SOLN
80.0000 mL | Freq: Once | INTRAVENOUS | Status: AC | PRN
  Administered 2014-10-14: 80 mL via INTRAVENOUS

## 2014-10-14 MED ORDER — HYDRALAZINE HCL 20 MG/ML IJ SOLN: 5.0000 mg | INTRAMUSCULAR | Status: DC | PRN

## 2014-10-14 MED ORDER — GADOBENATE DIMEGLUMINE 529 MG/ML IV SOLN
10.0000 mL | Freq: Once | INTRAVENOUS | Status: AC | PRN
  Administered 2014-10-14: 10 mL via INTRAVENOUS

## 2014-10-14 MED ORDER — ENSURE ENLIVE PO LIQD: 237.0000 mL | Freq: Two times a day (BID) | ORAL | Status: DC

## 2014-10-14 MED ORDER — SODIUM CHLORIDE 0.9 % IV SOLN
1000.0000 mg | Freq: Once | INTRAVENOUS | Status: AC
  Administered 2014-10-14: 1000 mg via INTRAVENOUS
  Filled 2014-10-14: qty 20

## 2014-10-14 MED ORDER — STROKE: EARLY STAGES OF RECOVERY BOOK
Freq: Once | Status: AC
  Administered 2014-10-14: 03:00:00

## 2014-10-14 NOTE — Progress Notes (Addendum)
[facility] PROGRESS NOTE  Paul [name] ATF:[id number] DOB: [birth year] DOA: 10/13/2014  PCP: Junie Panning, NP  Brief HPI: 81 year old Caucasian male with a past medical history of hypertension, GERD, tobacco abuse, COPD, history of complex partial seizures, non-small cell lung cancer, previous history of stroke, presented with fall and possible syncope. On examination, he was found to have left-sided weakness. MRI showed a right-sided stroke. Of note, patient was discharged from [facility] on September 22 after being managed for pneumonia.  Past medical history:  Past Medical History  Diagnosis Date  . Hypertension   . COPD (chronic obstructive pulmonary disease)   . Hemorrhoids     external  . 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 GERD (gastroesophageal reflux disease)   . Emphysema   . HOH (hard of hearing)   . Pneumonia   . Seizures   . Headache(784.0)   . Non-small cell carcinoma of lung dx'd 2006    stage 3  . Lung cancer   . Stroke     Consultants: Neurology  Procedures:  2-D echocardiogram Study Conclusions - Left ventricle: The cavity size was normal. Systolic function wasnormal. The estimated ejection fraction was in the range of 55%to 60%. Wall motion was normal; there were no regional wallmotion abnormalities. Doppler parameters are consistent withabnormal left ventricular relaxation (grade 1 diastolicdysfunction). There was no evidence of elevated ventricularfilling pressure by Doppler parameters. - Aortic valve: Trileaflet; normal thickness leaflets. There was noregurgitation. - Aortic root: The aortic root was normal in size. - Mitral valve: There was mild regurgitation. - Left atrium: The atrium was normal in size. - Right ventricle: Systolic function was normal. - Right atrium: The atrium was normal in size. - Tricuspid valve: There was no regurgitation. - Pulmonary arteries: Systolic pressure was within the  normalrange. - Inferior vena cava: The vessel was normal in size. - Pericardium, extracardiac: There was no pericardial effusion. Impressions: - This is a limited echocardiogram with only subcostal images.There appears to be normal chamber size, normal biventricularsystolic function.There is abnormal relaxation with normal filling pressures.Mild mitral regurgitation.Normal RVSP.  Antibiotics: Augmentin  Subjective: Patient somewhat belligerent. Complains of pain allover. Speech is not entirely clear.  Objective: Vital Signs  Filed Vitals:   10/14/14 0331 10/14/14 0529 10/14/14 0600 10/14/14 0709  BP: 131/81 89/63 142/121 122/92  Pulse: 105 70  89  Temp: 97.8 F (36.6 C) 97.7 F (36.5 C)  97.9 F (36.6 C)  TempSrc: Axillary Axillary  Axillary  Resp: '20 18  18  '$ Weight:      SpO2: 92% 97%  99%    Intake/Output Summary (Last 24 hours) at 10/14/14 1223 Last data filed at 10/13/14 2219  Gross per 24 hour  Intake      0 ml  Output    300 ml  Net   -300 ml   Filed Weights   10/13/14 1823 10/14/14 0100  Weight: 52.617 kg (116 lb) 50.531 kg (111 lb 6.4 oz)    General appearance: alert, cooperative, appears stated age and no distress Resp: clear to auscultation bilaterally Cardio: regular rate and rhythm, S1, S2 normal, no murmur, click, rub or gallop GI: soft, non-tender; bowel sounds normal; no masses,  no organomegaly Extremities: His left forearm is covered in a dressing. There is a skin tear. This is noted to be tender. Neurologic: left arm and leg weakness is noted. He is alert. Distracted. Does not answer questions.  Lab Results:  Basic Metabolic Panel:  Recent Labs Lab 10/10/14 0940 10/11/14 0525 10/12/14 0515 10/13/14 2000 10/14/14 0329  NA 129* 131* 130* 134* 131*  K 4.4 4.2 4.0 4.2 4.1  CL 92* 96* 96* 100* 94*  CO2 '28 27 26 28 29  '$ GLUCOSE 127* 130* 106* 123* 112*  BUN '11 10 10 9 6  '$ CREATININE 0.48* 0.41* 0.40* 0.49* 0.60*  CALCIUM 9.0 8.2* 8.4*  8.5* 8.6*   Liver Function Tests:  Recent Labs Lab 10/13/14 2000  AST 23  ALT 16*  ALKPHOS 97  BILITOT 0.5  PROT 5.7*  ALBUMIN 2.0*   CBC:  Recent Labs Lab 10/10/14 0940 10/11/14 0525 10/12/14 0515 10/13/14 2000 10/14/14 0329  WBC 9.9 9.0 7.9 12.6* 9.9  NEUTROABS  --   --   --  11.0*  --   HGB 12.7* 11.4* 11.2* 11.4* 12.5*  HCT 38.9* 35.7* 34.4* 35.2* 39.6  MCV 82.1 83.0 81.9 80.9 82.5  PLT 414* 379 354 346 354   Cardiac Enzymes:  Recent Labs Lab 10/10/14 0953  TROPONINI <0.03   BNP (last 3 results)  Recent Labs  10/14/14 0329  BNP 317.4*   CBG:  Recent Labs Lab 10/14/14 0639  GLUCAP 132*    Recent Results (from the past 240 hour(s))  Culture, blood (routine x 2)     Status: None (Preliminary result)   Collection Time: 10/10/14  1:14 PM  Result Value Ref Range Status   Specimen Description BLOOD LEFT ANTECUBITAL  Final   Special Requests BOTTLES DRAWN AEROBIC AND ANAEROBIC 5.5CC  Final   Culture   Final    NO GROWTH 3 DAYS Performed at [facility]    Report Status PENDING  Incomplete      Studies/Results: Dg Forearm Left  10/13/2014   CLINICAL DATA:  Fall with multiple skin tears midshaft, limited range of motion. Limited ability to cooperate.  EXAM: LEFT FOREARM - 2 VIEW  COMPARISON:  None.  FINDINGS: There is no evidence of fracture or other focal bone lesions. Soft tissues are unremarkable.  IMPRESSION: Negative.   Electronically Signed   By: Nolon Nations M.D.   On: 10/13/2014 19:24   Ct Head Wo Contrast  10/14/2014   CLINICAL DATA:  Code stroke. Altered mental status. Right-sided gaze. Recent contrast-enhanced CT.  EXAM: CT HEAD WITHOUT CONTRAST  TECHNIQUE: Contiguous axial images were obtained from the base of the skull through the vertex without intravenous contrast.  COMPARISON:  MRI brain 10/14/2014.  CT head 10/13/2014.  FINDINGS: Diffuse cerebral atrophy. Ventricular dilatation consistent with central atrophy.  Low-attenuation changes throughout the deep white matter consistent with small vessel ischemia. No change in parenchymal pattern since previous study. No mass effect or midline shift. No abnormal extra-axial fluid collections. Gray-white matter junctions are distinct. Basal cisterns are not effaced. No evidence of acute intracranial hemorrhage. No depressed skull fractures. Mucosal thickening in the right maxillary antrum.  IMPRESSION: No acute intracranial abnormalities. Chronic atrophy and small vessel ischemic changes diffusely.  These results were called by telephone at the time of interpretation on 10/14/2014 at 6:27 am to Dr. Clance Boll , who verbally acknowledged these results.   Electronically Signed   By: Lucienne Capers M.D.   On: 10/14/2014 06:28   Ct Head Wo Contrast  10/13/2014   CLINICAL DATA:  Unwitnessed fall.  EXAM: CT HEAD WITHOUT CONTRAST  TECHNIQUE: Contiguous axial images were obtained from the base of the skull through the vertex without intravenous contrast.  COMPARISON:  07/22/2014.  FINDINGS: Diffusely enlarged ventricles and subarachnoid spaces. Patchy white matter low density in both cerebral hemispheres. No skull fracture or intracranial hemorrhage. Right maxillary sinus air-fluid level and mild mucosal thickening. The mucosal thickening and significantly improved. Progressive right mastoid opacification.  IMPRESSION: 1. No skull fracture or intracranial hemorrhage. 2. Stable atrophy and chronic small vessel white matter ischemic changes. 3. Improved right maxillary sinusitis with an acute component today. 4. Mildly progressive right mastoid opacification.   Electronically Signed   By: Claudie Revering M.D.   On: 10/13/2014 19:48   Ct Angio Chest Pe W/cm &/or Wo Cm  10/14/2014   CLINICAL DATA:  Shortness of breath, syncope, and tachycardia. History of DVT.  EXAM: CT ANGIOGRAPHY CHEST WITH CONTRAST  TECHNIQUE: Multidetector CT imaging of the chest was performed using the standard  protocol during bolus administration of intravenous contrast. Multiplanar CT image reconstructions and MIPs were obtained to evaluate the vascular anatomy.  CONTRAST:  28m OMNIPAQUE IOHEXOL 350 MG/ML SOLN  COMPARISON:  10/10/2014  FINDINGS: Technically adequate study with good opacification of the central and segmental pulmonary arteries. No focal filling defects. No evidence of significant pulmonary embolus.  Normal heart size. Normal caliber thoracic aorta. Eccentric noncalcified plaque formation in the lower descending aorta. No aortic dissection. Calcification in the aorta. Great vessel origins are patent. Focal stenosis of the proximal right carotid artery representing about 75% diameter reduction focally. Distal flow is demonstrated. Esophagus is decompressed. Moderate-sized esophageal hiatal hernia. No significant lymphadenopathy in the chest.  Diffuse emphysematous changes throughout both lungs. Consolidation in the right upper lung with air bronchograms possibly representing changes related to known lung cancer. This could represent postobstructive change or post treatment change. Patchy airspace infiltrates in the right apex may be inflammatory consolidation or atelectasis in the right lung base is progressing since prior study. Loculated right pleural fluid and gas collections anteriorly and in the base. These were present previously and could be due to infection or post treatment changes or perhaps less likely pleural metastasis. Mucoid material demonstrated in bilateral lower lobe bronchi. Area of cavitation versus emphysematous bulla with small air-fluid level in the left lung base is similar to prior study.  Included portions of the upper abdominal organs are grossly unremarkable. Or post obstructive enter similar to prior study.  Review of the MIP images confirms the above findings.  IMPRESSION: No evidence of significant pulmonary embolus. Diffuse emphysematous changes throughout the lungs. Areas  of consolidation in the right upper lung and right lung base probably related to history of lung cancer. Changes may represent postobstructive consolidation or treatment changes. Patchy airspace disease in the right apex. Loculated gas and fluid collections in the right pleural space. Mucous plugging.   Electronically Signed   By: WLucienne CapersM.D.   On: 10/14/2014 02:45   Mr Brain Wo Contrast  10/14/2014   CLINICAL DATA:  Initial evaluation for acute syncope, fall. History of probable recurrent lung cancer.  EXAM: MRI HEAD WITHOUT CONTRAST  TECHNIQUE: Multiplanar, multiecho pulse sequences of the brain and surrounding structures were obtained without intravenous contrast.  COMPARISON:  Prior CT from 10/13/2014 as well as earlier studies.  FINDINGS: Generalized cerebral volume loss with chronic microvascular ischemic changes again seen. Remote cortical infarct within the right occipital lobe.  Multiple patchy cortical, subcortical, and periventricular ischemic infarcts seen involving the right frontal, parietal, and occipital lobes in a fairly linear watershed distribution. The largest of these foci involves the cortical gray matter in the anterior  right frontal lobe (series 3, image 38). No associated hemorrhage or mass effect. Diminished flow void within the distal right vertebral artery, which may be related to slow flow or occlusion. Major intracranial flow voids otherwise maintained.  No acute or chronic intracranial hemorrhage. No mass lesion, midline shift, or mass effect. Ventricular prominence related to global parenchymal volume loss present without hydrocephalus. No extra-axial fluid collection.  Craniocervical junction within normal limits. Pituitary gland normal.  No acute abnormality about the orbits.  Moderate mucosal thickening within the right maxillary sinus. Paranasal sinuses are otherwise clear. Right mastoid effusion noted. Inner ear structures normal.  Bone marrow signal intensity within  normal limits. Scalp soft tissues unremarkable.  IMPRESSION: 1. Patchy multi focal ischemic infarcts involving the right frontal, parietal, and occipital lobes, largely in a watershed type distribution. No associated hemorrhage or mass effect. 2. Abnormal flow void within the distal right vertebral artery, which may be related to slow flow or possibly occlusion. Otherwise preserved major intracranial flow voids. 3. Advanced cerebral atrophy with moderate chronic microvascular ischemic changes.   Electronically Signed   By: Jeannine Boga M.D.   On: 10/14/2014 01:20   Dg Chest Port 1 View  10/13/2014   CLINICAL DATA:  Sepsis. History of hypertension, COPD, lung cancer, stroke, emphysema, and pneumonia.  EXAM: PORTABLE CHEST 1 VIEW  COMPARISON:  10/11/2014  FINDINGS: Normal heart size and pulmonary vascularity. Volume loss and scarring in the right lung with areas of probable cavitation and consolidation demonstrated in the right upper lung and right base. This is probably related to the patient's history of lung cancer and may represent residual mass lesions, post treatment changes, and/or postobstructive consolidation. Diffuse emphysematous changes and scattered fibrosis in both lungs. No acute change since the prior study.  IMPRESSION: Chronic changes in the right lung and diffuse emphysematous changes with fibrosis in both lungs. No acute change since previous study.   Electronically Signed   By: Lucienne Capers M.D.   On: 10/13/2014 23:47    Medications:  Scheduled: . amoxicillin-clavulanate  1 tablet Oral Q12H  . aspirin  300 mg Rectal Daily   Or  . aspirin  325 mg Oral Daily  . dextromethorphan-guaiFENesin  1 tablet Oral BID  . feeding supplement (ENSURE ENLIVE)  237 mL Oral TID BM  . gabapentin  300 mg Oral QPM  . heparin  5,000 Units Subcutaneous 3 times per day  . ipratropium-albuterol  3 mL Nebulization TID  . mometasone-formoterol  2 puff Inhalation BID  . nicotine  21 mg  Transdermal Daily  . phenytoin  300 mg Oral Daily  . predniSONE  20 mg Oral Q breakfast  . sodium chloride  1,500 mL Intravenous Once  . sodium chloride  3 mL Intravenous Q12H   Continuous: . sodium chloride 100 mL/hr at 10/14/14 0349   HWE:[id number] **OR** acetaminophen, albuterol, hydrALAZINE, HYDROcodone-acetaminophen  Assessment/Plan:  Principal Problem:   CVA (cerebral infarction) Active Problems:   TOBACCO ABUSE   Essential hypertension   Acute thromboembolism of deep veins of lower extremity   GASTROESOPHAGEAL REFLUX DISEASE   Falls   CAP (community acquired pneumonia)   Shortness of breath   FTT (failure to thrive) in adult   Lung cancer   Protein-calorie malnutrition, severe   Cavitary pneumonia   Complex partial seizure    Acute stroke Neurology is following. Patient is on aspirin. PT and OT to evaluate. Workup in progress.  Fall Possibly due to the above. PT and OT to evaluate.  Complex partial seizures Patient's Dilantin level was subtherapeutic. He was loaded with Dilantin. Continue home dose of Dilantin.  Recent community-acquired pneumonia with possible cavitary lesion Continue Augmentin. Plan was to continue for 3 weeks. CT angiogram of chest was done. No PE was noticed.  History of essential hypertension Does not appear to be on any home medications for same. Continue to monitor  History of lung cancer Suspected recurrence based on recent studies. Patient to follow-up with his oncologist.  Severe protein calorie malnutrition Continue ensure  ADDENDUM Long conversation with the neurologist, Dr. Erlinda Hong regarding this patient's case. It appears that the patient was on warfarin for hypercoagulable state and a history of stroke, which was thought to be embolic in 8811. However, warfarin was discontinued in June due to frequent falls and seizures. It remains unclear as to how aggressive we need to be in this patient's case. He has had a significant  stroke with left-sided deficits. I have contacted his friend Nolberto Hanlon who is not related to him. Patient does not have any relatives in this area. He does have a son and a daughter but is not in contact with them and they're not involved in his care. She also states that patient is known to be noncompliant with medications as well as appointments with physicians. In view of all this, it might be beneficial to establish goals of care. Palliative medicine has been consulted.  DVT Prophylaxis: Subcutaneous heparin    Code Status: Full code  Family Communication: Discussed with the patient. No family at bedside  Disposition Plan: Await PT and OT evaluation. Await neurology input.     LOS: 1 day   [facility] Pager [phone] 10/14/2014, 12:23 PM  If 7PM-7AM, please contact night-coverage at [email or web address], password [facility]

## 2014-10-14 NOTE — Progress Notes (Signed)
Routing EEG completed, results pending.

## 2014-10-14 NOTE — Procedures (Signed)
ELECTROENCEPHALOGRAM REPORT  Date of Study: 10/14/2014  Patient's Name: Paul [name] MRN: [id number] Date of Birth: 10-20-1930  Referring Provider: Dr. Rosalin Hawking  Clinical History: This is an 81 year old man found at home with inability to move left arm. He was noted to have a change in neuro status, not following commands.   Medications: phenytoin (DILANTIN) ER capsule 300 mg gabapentin (NEURONTIN) capsule 300 mg acetaminophen (TYLENOL) tablet 650 mg albuterol (PROVENTIL) (2.5 MG/3ML) 0.083% nebulizer solution 2.5 mg amoxicillin-clavulanate (AUGMENTIN) 875-125 MG per tablet 1 tablet aspirin tablet 325 mg predniSONE (DELTASONE) tablet 20 mg  Technical Summary: A multichannel digital EEG recording measured by the international 10-20 system with electrodes applied with paste and impedances below 5000 ohms performed as portable with EKG monitoring in an awake and drowsy patient.  Hyperventilation and photic stimulation were not performed.  The digital EEG was referentially recorded, reformatted, and digitally filtered in a variety of bipolar and referential montages for optimal display.   Description: The patient is awake and drowsy during the recording.  There is no clear posterior dominant rhythm. The background consists of a large amount of diffuse 5-6 Hz theta slowing with additional focal 4-5 Hz slowing over the right temporoparietal region, at times sharply contoured without clear epileptogenic potential. Normal sleep architecture is not seen. Hyperventilation and photic stimulation were not performed.  There were no clear epileptiform discharges or electrographic seizures seen.    EKG lead was unremarkable.  Impression: This awake and drowsy EEG is abnormal due to the presence of: 1. Mild diffuse slowing of the waking background. 2. Additional focal slowing over the right temporoparietal region  Clinical Correlation of the above findings indicates bilateral cerebral dysfunction  that is non-specific in etiology and can be seen with hypoxic/ischemic injury, toxic/metabolic encephalopathies, or medication effect. Additional focal slowing over the right temporoparietal region indicates focal cerebral dysfunction in this region, suggestive of underlying structural or physiologic abnormality. The absence of epileptiform discharges does not rule out a clinical diagnosis of epilepsy.  Clinical correlation is advised.   Ellouise Newer, M.D.

## 2014-10-14 NOTE — Progress Notes (Signed)
PT Cancellation Note  Patient Details Name: Paul [name] MRN: [id number] DOB: Mar 21, 1930   Cancelled Treatment:    Reason Eval/Treat Not Completed: Patient not medically ready New orders received to start PT 9/24. Will follow-up tomorrow.  Ellouise Newer 10/14/2014, 7:56 AM Elayne Snare, [place]

## 2014-10-14 NOTE — Progress Notes (Signed)
*  PRELIMINARY RESULTS* Echocardiogram 2D Echocardiogram has been performed.  Leavy Cella 10/14/2014, 10:25 AM

## 2014-10-14 NOTE — Consult Note (Signed)
Referring Physician: Dr Blaine Hamper, Rhunette Croft    Chief Complaint: code stroke, altered mental status, left hemiplegia, dysarthria  HPI:                                                                                                                                         Paul [name] is an 81 y.o. male with a past medical history significant for HTN, non small cell carcinoma lung, symptomatic complex partial seizures, GERD, tobacco abuse, COPD, diastolic congestive heart failure, recent hospitalization at [facility] due to pneumonia. He refused SNIF placement at discharge. He was discharged on Augmentin for 3 weeks. Patient was found on the floor at home with inability to move the left arm. MRI brain was done upon arrival, personally reviewed, and showed evidence of multiple patchy cortical, subcortical, and periventricular ischemic infarcts seen involving the right frontal, parietal, and occipital lobes in a fairly linear watershed distribution. No associated hemorrhage or mass effect.  He was noted to have change in neuro status around 5:30 and code stroke was activated. NIHSS 17. STAT CT brain was personally reviewed and showed no acute abnormality. Patient is presently awake and alert but is not following commands.  Date last known well: undetermined Time last known well: undetermined tPA Given: no, late presentation NIHSS: 17  MRS:   Past Medical History  Diagnosis Date  . Hypertension   . COPD (chronic obstructive pulmonary disease)   . Hemorrhoids     external  . 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 GERD (gastroesophageal reflux disease)   . Emphysema   . HOH (hard of hearing)   . Pneumonia   . Seizures   . Headache(784.0)   . Non-small cell carcinoma of lung dx'd 2006    stage 3  . Lung cancer   . Stroke     Past Surgical History  Procedure Laterality Date  . Inner ear surgery    . Toe amputation Left   . Tonsillectomy    . Amputation Left 04/16/2013    Procedure:  AMPUTATION RAY;  Surgeon: Newt Minion, MD;  Location: [place];  Service: Orthopedics;  Laterality: Left;  Left Foot 4th and 5th Ray Amputation  . 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 Heart failure Brother    Social History:  reports that he has been smoking Cigarettes.  He has a 90 pack-year smoking history. He has never used smokeless tobacco. He reports that he does not drink alcohol or use illicit drugs. Family history: unable to obtain due to mental status Allergies: No Known Allergies  Medications:  Scheduled: . amoxicillin-clavulanate  1 tablet Oral Q12H  . aspirin  300 mg Rectal Daily   Or  . aspirin  325 mg Oral Daily  . dextromethorphan-guaiFENesin  1 tablet Oral BID  . feeding supplement (ENSURE ENLIVE)  237 mL Oral TID BM  . gabapentin  300 mg Oral QPM  . heparin  5,000 Units Subcutaneous 3 times per day  . ipratropium-albuterol  3 mL Nebulization Q6H  . mometasone-formoterol  2 puff Inhalation BID  . nicotine  21 mg Transdermal Daily  . phenytoin  300 mg Oral Daily  . predniSONE  20 mg Oral Q breakfast  . sodium chloride  1,500 mL Intravenous Once  . sodium chloride  3 mL Intravenous Q12H    ROS: unable to obtain due to mental status                                                                                                                                      History obtained from chart review   Physical exam:  Constitutional: pleasant male in no apparent distress. Blood pressure 142/121, pulse 70, temperature 97.7 F (36.5 C), temperature source Axillary, resp. rate 18, weight 50.531 kg (111 lb 6.4 oz), SpO2 97 %. Eyes: no jaundice or exophthalmos.   Head: normocephalic. Neck: supple, no bruits, no JVD. Cardiac: no murmurs. Lungs: clear. Abdomen: soft, no tender, no mass. Extremities: no edema, clubbing, or cyanosis.  Skin: no rash  Neurologic Examination:                                                                                                      General: NAD Mental Status: Alert, awake, disoriented to place. Marked dysarthria. Does not follow commands. Cranial Nerves: II: Discs flat bilaterally; Visual fields grossly normal, pupils equal, round, reactive to light III,IV, VI: ptosis not present, right gaze preference V,VII: smile asymmetric due to left face weakness, facial light touch sensation normal bilaterally VIII: hearing normal bilaterally IX,X: uvula rises symmetrically XI: bilateral shoulder shrug no tested XII: midline tongue Motor: Dense left hemiparesis Tone decreased in the left Sensory: withdraw to pain Deep Tendon Reflexes:  1+ all over Plantars: Right: downgoing   Left: upgoing Cerebellar: Unable to test Gait:  Unable to test    Results for orders placed or performed during the hospital encounter of 10/13/14 (from the past 48 hour(s))  Protime-INR     Status: None   Collection Time: 10/13/14  8:00 PM  Result Value Ref Range  Prothrombin Time 14.9 11.6 - 15.2 seconds   INR 1.16 0.00 - 1.49  APTT     Status: None   Collection Time: 10/13/14  8:00 PM  Result Value Ref Range   aPTT 27 24 - 37 seconds  CBC     Status: Abnormal   Collection Time: 10/13/14  8:00 PM  Result Value Ref Range   WBC 12.6 (H) 4.0 - 10.5 K/uL   RBC 4.35 4.22 - 5.81 MIL/uL   Hemoglobin 11.4 (L) 13.0 - 17.0 g/dL   HCT 35.2 (L) 39.0 - 52.0 %   MCV 80.9 78.0 - 100.0 fL   MCH 26.2 26.0 - 34.0 pg   MCHC 32.4 30.0 - 36.0 g/dL   RDW 14.1 11.5 - 15.5 %   Platelets 346 150 - 400 K/uL  Differential     Status: Abnormal   Collection Time: 10/13/14  8:00 PM  Result Value Ref Range   Neutrophils Relative % 88 %    Neutro Abs 11.0 (H) 1.7 - 7.7 K/uL   Lymphocytes Relative 4 %   Lymphs Abs 0.5 (L) 0.7 - 4.0 K/uL   Monocytes Relative 8 %   Monocytes Absolute 1.0 0.1 - 1.0 K/uL   Eosinophils Relative 0 %   Eosinophils Absolute 0.0 0.0 - 0.7 K/uL   Basophils Relative 0 %   Basophils Absolute 0.0 0.0 - 0.1 K/uL  Comprehensive metabolic panel     Status: Abnormal   Collection Time: 10/13/14  8:00 PM  Result Value Ref Range   Sodium 134 (L) 135 - 145 mmol/L   Potassium 4.2 3.5 - 5.1 mmol/L   Chloride 100 (L) 101 - 111 mmol/L   CO2 28 22 - 32 mmol/L   Glucose, Bld 123 (H) 65 - 99 mg/dL   BUN 9 6 - 20 mg/dL   Creatinine, Ser 0.49 (L) 0.61 - 1.24 mg/dL   Calcium 8.5 (L) 8.9 - 10.3 mg/dL   Total Protein 5.7 (L) 6.5 - 8.1 g/dL   Albumin 2.0 (L) 3.5 - 5.0 g/dL   AST 23 15 - 41 U/L   ALT 16 (L) 17 - 63 U/L   Alkaline Phosphatase 97 38 - 126 U/L   Total Bilirubin 0.5 0.3 - 1.2 mg/dL   GFR calc non Af Amer >60 >60 mL/min   GFR calc Af Amer >60 >60 mL/min    Comment: (NOTE) The eGFR has been calculated using the CKD EPI equation. This calculation has not been validated in all clinical situations. eGFR's persistently <60 mL/min signify possible Chronic Kidney Disease.    Anion gap 6 5 - 15  Urinalysis, Routine w reflex microscopic (not at [facility])     Status: Abnormal   Collection Time: 10/13/14  8:18 PM  Result Value Ref Range   Color, Urine YELLOW YELLOW   APPearance CLEAR CLEAR   Specific Gravity, Urine 1.021 1.005 - 1.030   pH 7.5 5.0 - 8.0   Glucose, UA NEGATIVE NEGATIVE mg/dL   Hgb urine dipstick SMALL (A) NEGATIVE   Bilirubin Urine NEGATIVE NEGATIVE   Ketones, ur NEGATIVE NEGATIVE mg/dL   Protein, ur NEGATIVE NEGATIVE mg/dL   Urobilinogen, UA 0.2 0.0 - 1.0 mg/dL   Nitrite NEGATIVE NEGATIVE   Leukocytes, UA NEGATIVE NEGATIVE  Urine microscopic-add on     Status: Abnormal   Collection Time: 10/13/14  8:18 PM  Result Value Ref Range   WBC, UA 0-2 <3 WBC/hpf   RBC / HPF 3-6 <3 RBC/hpf  Bacteria, UA RARE RARE   Crystals CA OXALATE CRYSTALS (A) NEGATIVE   Urine-Other MUCOUS PRESENT   Lactic acid, plasma     Status: None   Collection Time: 10/13/14 11:58 PM  Result Value Ref Range   Lactic Acid, Venous 1.1 0.5 - 2.0 mmol/L  Procalcitonin     Status: None   Collection Time: 10/14/14 12:05 AM  Result Value Ref Range   Procalcitonin 0.14 ng/mL    Comment:        Interpretation: PCT (Procalcitonin) <= 0.5 ng/mL: Systemic infection (sepsis) is not likely. Local bacterial infection is possible. (NOTE)         ICU PCT Algorithm               Non ICU PCT Algorithm    ----------------------------     ------------------------------         PCT < 0.25 ng/mL                 PCT < 0.1 ng/mL     Stopping of antibiotics            Stopping of antibiotics       strongly encouraged.               strongly encouraged.    ----------------------------     ------------------------------       PCT level decrease by               PCT < 0.25 ng/mL       >= 80% from peak PCT       OR PCT 0.25 - 0.5 ng/mL          Stopping of antibiotics                                             encouraged.     Stopping of antibiotics           encouraged.    ----------------------------     ------------------------------       PCT level decrease by              PCT >= 0.25 ng/mL       < 80% from peak PCT        AND PCT >= 0.5 ng/mL            Continuin g antibiotics                                              encouraged.       Continuing antibiotics            encouraged.    ----------------------------     ------------------------------     PCT level increase compared          PCT > 0.5 ng/mL         with peak PCT AND          PCT >= 0.5 ng/mL             Escalation of antibiotics  strongly encouraged.      Escalation of antibiotics        strongly encouraged.   Phenytoin level, total     Status: Abnormal   Collection Time: 10/14/14 12:05 AM  Result  Value Ref Range   Phenytoin Lvl <2.5 (L) 10.0 - 20.0 ug/mL  Ethanol     Status: None   Collection Time: 10/14/14 12:06 AM  Result Value Ref Range   Alcohol, Ethyl (B) <5 <5 mg/dL    Comment:        LOWEST DETECTABLE LIMIT FOR SERUM ALCOHOL IS 5 mg/dL FOR MEDICAL PURPOSES ONLY   Brain natriuretic peptide     Status: Abnormal   Collection Time: 10/14/14  3:29 AM  Result Value Ref Range   B Natriuretic Peptide 317.4 (H) 0.0 - 100.0 pg/mL  Basic metabolic panel     Status: Abnormal   Collection Time: 10/14/14  3:29 AM  Result Value Ref Range   Sodium 131 (L) 135 - 145 mmol/L   Potassium 4.1 3.5 - 5.1 mmol/L   Chloride 94 (L) 101 - 111 mmol/L   CO2 29 22 - 32 mmol/L   Glucose, Bld 112 (H) 65 - 99 mg/dL   BUN 6 6 - 20 mg/dL   Creatinine, Ser 0.60 (L) 0.61 - 1.24 mg/dL   Calcium 8.6 (L) 8.9 - 10.3 mg/dL   GFR calc non Af Amer >60 >60 mL/min   GFR calc Af Amer >60 >60 mL/min    Comment: (NOTE) The eGFR has been calculated using the CKD EPI equation. This calculation has not been validated in all clinical situations. eGFR's persistently <60 mL/min signify possible Chronic Kidney Disease.    Anion gap 8 5 - 15  CBC     Status: Abnormal   Collection Time: 10/14/14  3:29 AM  Result Value Ref Range   WBC 9.9 4.0 - 10.5 K/uL   RBC 4.80 4.22 - 5.81 MIL/uL   Hemoglobin 12.5 (L) 13.0 - 17.0 g/dL   HCT 39.6 39.0 - 52.0 %   MCV 82.5 78.0 - 100.0 fL   MCH 26.0 26.0 - 34.0 pg   MCHC 31.6 30.0 - 36.0 g/dL   RDW 14.4 11.5 - 15.5 %   Platelets 354 150 - 400 K/uL  Lactic acid, plasma     Status: None   Collection Time: 10/14/14  3:29 AM  Result Value Ref Range   Lactic Acid, Venous 2.0 0.5 - 2.0 mmol/L   Dg Forearm Left  10/13/2014   CLINICAL DATA:  Fall with multiple skin tears midshaft, limited range of motion. Limited ability to cooperate.  EXAM: LEFT FOREARM - 2 VIEW  COMPARISON:  None.  FINDINGS: There is no evidence of fracture or other focal bone lesions. Soft tissues are  unremarkable.  IMPRESSION: Negative.   Electronically Signed   By: Nolon Nations M.D.   On: 10/13/2014 19:24   Ct Head Wo Contrast  10/14/2014   CLINICAL DATA:  Code stroke. Altered mental status. Right-sided gaze. Recent contrast-enhanced CT.  EXAM: CT HEAD WITHOUT CONTRAST  TECHNIQUE: Contiguous axial images were obtained from the base of the skull through the vertex without intravenous contrast.  COMPARISON:  MRI brain 10/14/2014.  CT head 10/13/2014.  FINDINGS: Diffuse cerebral atrophy. Ventricular dilatation consistent with central atrophy. Low-attenuation changes throughout the deep white matter consistent with small vessel ischemia. No change in parenchymal pattern since previous study. No mass effect or midline shift. No abnormal extra-axial fluid collections. Gray-white matter junctions  are distinct. Basal cisterns are not effaced. No evidence of acute intracranial hemorrhage. No depressed skull fractures. Mucosal thickening in the right maxillary antrum.  IMPRESSION: No acute intracranial abnormalities. Chronic atrophy and small vessel ischemic changes diffusely.  These results were called by telephone at the time of interpretation on 10/14/2014 at 6:27 am to Dr. Clance Boll , who verbally acknowledged these results.   Electronically Signed   By: Lucienne Capers M.D.   On: 10/14/2014 06:28   Ct Head Wo Contrast  10/13/2014   CLINICAL DATA:  Unwitnessed fall.  EXAM: CT HEAD WITHOUT CONTRAST  TECHNIQUE: Contiguous axial images were obtained from the base of the skull through the vertex without intravenous contrast.  COMPARISON:  07/22/2014.  FINDINGS: Diffusely enlarged ventricles and subarachnoid spaces. Patchy white matter low density in both cerebral hemispheres. No skull fracture or intracranial hemorrhage. Right maxillary sinus air-fluid level and mild mucosal thickening. The mucosal thickening and significantly improved. Progressive right mastoid opacification.  IMPRESSION: 1. No skull  fracture or intracranial hemorrhage. 2. Stable atrophy and chronic small vessel white matter ischemic changes. 3. Improved right maxillary sinusitis with an acute component today. 4. Mildly progressive right mastoid opacification.   Electronically Signed   By: Claudie Revering M.D.   On: 10/13/2014 19:48   Ct Angio Chest Pe W/cm &/or Wo Cm  10/14/2014   CLINICAL DATA:  Shortness of breath, syncope, and tachycardia. History of DVT.  EXAM: CT ANGIOGRAPHY CHEST WITH CONTRAST  TECHNIQUE: Multidetector CT imaging of the chest was performed using the standard protocol during bolus administration of intravenous contrast. Multiplanar CT image reconstructions and MIPs were obtained to evaluate the vascular anatomy.  CONTRAST:  69m OMNIPAQUE IOHEXOL 350 MG/ML SOLN  COMPARISON:  10/10/2014  FINDINGS: Technically adequate study with good opacification of the central and segmental pulmonary arteries. No focal filling defects. No evidence of significant pulmonary embolus.  Normal heart size. Normal caliber thoracic aorta. Eccentric noncalcified plaque formation in the lower descending aorta. No aortic dissection. Calcification in the aorta. Great vessel origins are patent. Focal stenosis of the proximal right carotid artery representing about 75% diameter reduction focally. Distal flow is demonstrated. Esophagus is decompressed. Moderate-sized esophageal hiatal hernia. No significant lymphadenopathy in the chest.  Diffuse emphysematous changes throughout both lungs. Consolidation in the right upper lung with air bronchograms possibly representing changes related to known lung cancer. This could represent postobstructive change or post treatment change. Patchy airspace infiltrates in the right apex may be inflammatory consolidation or atelectasis in the right lung base is progressing since prior study. Loculated right pleural fluid and gas collections anteriorly and in the base. These were present previously and could be due to  infection or post treatment changes or perhaps less likely pleural metastasis. Mucoid material demonstrated in bilateral lower lobe bronchi. Area of cavitation versus emphysematous bulla with small air-fluid level in the left lung base is similar to prior study.  Included portions of the upper abdominal organs are grossly unremarkable. Or post obstructive enter similar to prior study.  Review of the MIP images confirms the above findings.  IMPRESSION: No evidence of significant pulmonary embolus. Diffuse emphysematous changes throughout the lungs. Areas of consolidation in the right upper lung and right lung base probably related to history of lung cancer. Changes may represent postobstructive consolidation or treatment changes. Patchy airspace disease in the right apex. Loculated gas and fluid collections in the right pleural space. Mucous plugging.   Electronically Signed   By: WLucienne Capers  M.D.   On: 10/14/2014 02:45   Mr Brain Wo Contrast  10/14/2014   CLINICAL DATA:  Initial evaluation for acute syncope, fall. History of probable recurrent lung cancer.  EXAM: MRI HEAD WITHOUT CONTRAST  TECHNIQUE: Multiplanar, multiecho pulse sequences of the brain and surrounding structures were obtained without intravenous contrast.  COMPARISON:  Prior CT from 10/13/2014 as well as earlier studies.  FINDINGS: Generalized cerebral volume loss with chronic microvascular ischemic changes again seen. Remote cortical infarct within the right occipital lobe.  Multiple patchy cortical, subcortical, and periventricular ischemic infarcts seen involving the right frontal, parietal, and occipital lobes in a fairly linear watershed distribution. The largest of these foci involves the cortical gray matter in the anterior right frontal lobe (series 3, image 38). No associated hemorrhage or mass effect. Diminished flow void within the distal right vertebral artery, which may be related to slow flow or occlusion. Major intracranial  flow voids otherwise maintained.  No acute or chronic intracranial hemorrhage. No mass lesion, midline shift, or mass effect. Ventricular prominence related to global parenchymal volume loss present without hydrocephalus. No extra-axial fluid collection.  Craniocervical junction within normal limits. Pituitary gland normal.  No acute abnormality about the orbits.  Moderate mucosal thickening within the right maxillary sinus. Paranasal sinuses are otherwise clear. Right mastoid effusion noted. Inner ear structures normal.  Bone marrow signal intensity within normal limits. Scalp soft tissues unremarkable.  IMPRESSION: 1. Patchy multi focal ischemic infarcts involving the right frontal, parietal, and occipital lobes, largely in a watershed type distribution. No associated hemorrhage or mass effect. 2. Abnormal flow void within the distal right vertebral artery, which may be related to slow flow or possibly occlusion. Otherwise preserved major intracranial flow voids. 3. Advanced cerebral atrophy with moderate chronic microvascular ischemic changes.   Electronically Signed   By: Jeannine Boga M.D.   On: 10/14/2014 01:20   Dg Chest Port 1 View  10/13/2014   CLINICAL DATA:  Sepsis. History of hypertension, COPD, lung cancer, stroke, emphysema, and pneumonia.  EXAM: PORTABLE CHEST 1 VIEW  COMPARISON:  10/11/2014  FINDINGS: Normal heart size and pulmonary vascularity. Volume loss and scarring in the right lung with areas of probable cavitation and consolidation demonstrated in the right upper lung and right base. This is probably related to the patient's history of lung cancer and may represent residual mass lesions, post treatment changes, and/or postobstructive consolidation. Diffuse emphysematous changes and scattered fibrosis in both lungs. No acute change since the prior study.  IMPRESSION: Chronic changes in the right lung and diffuse emphysematous changes with fibrosis in both lungs. No acute change since  previous study.   Electronically Signed   By: Lucienne Capers M.D.   On: 10/13/2014 23:47    Assessment: 81 y.o. male with a constellation of symptoms and MRI brain findings consistent with a right MCA territory infarct. NIHSS 17.  Patient was found on the floor with left arm weakness at home, and then worsened while being on the floor. CT brain showed no acute changes. Although he experienced a change in neurological status around 5:30 today, his last know well is unknown as he was found on the floor with left arm weakness before being admitted to the hospital, thus no a candidate for iv tpa or endovascular intervention. Complete stroke work up. Stroke team will follow up.  Stroke Risk Factors - age, HTN, smoking  Plan: 1. HgbA1c, fasting lipid panel 2. MRI, MRA  of the brain without contrast 3. Echocardiogram 4.  Carotid dopplers 5. Prophylactic therapy-aspirin pending neuro testing 6. Risk factor modification 7. Telemetry monitoring 8. Frequent neuro checks 9. PT/OT SLP 10. NPO Dorian Pod, MD [facility] [phone]  10/14/2014, 6:32 AM

## 2014-10-14 NOTE — Progress Notes (Signed)
This RN saw pt around 0530 for neuro check and noticed a significant change in status. Pt with right gaze, garbled speech, urinated in the bed, and not following commands. NIH repeated was 17 (up from 10).  Rapid called, pt taken to CT with MD. Results read. Will recheck vitals in 30 min. Monitoring closely. Hocutt, Rande Brunt, RN

## 2014-10-14 NOTE — Care Management Note (Signed)
Case Management Note  Patient Details  Name: Paul [name] MRN: [id number] Date of Birth: 03/04/1930  Subjective/Objective:                    Action/Plan: Patient admitted with a fall and positive for CVA. Pt lives at home alone and was set up with [facility] after last discharge. CM will continue to follow for discharge needs.   Expected Discharge Date:                  Expected Discharge Plan:  [place]  In-House Referral:     Discharge planning Services     Post Acute Care Choice:    Choice offered to:     DME Arranged:    DME Agency:     HH Arranged:    [place] Agency:     Status of Service:  In process, will continue to follow  Medicare Important Message Given:    Date Medicare IM Given:    Medicare IM give by:    Date Additional Medicare IM Given:    Additional Medicare Important Message give by:     If discussed at [place] of Stay Meetings, dates discussed:    Additional Comments:  Pollie Friar, RN 10/14/2014, 10:53 AM

## 2014-10-14 NOTE — Progress Notes (Signed)
RN paged secondary to acute change in neuro exam. (Pt admitted earlier and had a + MRI brain for CVA) Code stroke called. RRRN at bedside. NP to bedside. Pt with right gaze preference, left sided weakness, worsening garbled speech, unequal pupils, among other changes. Pt taken stat to CT scanner. Dr. Armida Sans of neuro present. Per Dr. Armida Sans, no bleed. NIH score 10 on arrival to floor from ED, now 17. Treatment had already been started for stroke.  See neuro notes. Paul Boll, NP [facility]

## 2014-10-14 NOTE — Progress Notes (Signed)
OT Cancellation Note  Patient Details Name: Paul [name] MRN: [id number] DOB: 1930-07-25   Cancelled Treatment:    Reason Eval/Treat Not Completed: Patient not medically ready. Pt very lethargic this afternoon and unable to actively participate in OT assessment.  OT will follow up on 9/24.   Phineas Semen 10/14/2014, 2:04 PM

## 2014-10-14 NOTE — Progress Notes (Signed)
HPI STROKE TEAM PROGRESS NOTE  HPI Paul [name] is an 81 y.o. male with a past medical history significant for HTN, non small cell carcinoma lung, symptomatic complex partial seizures, GERD, tobacco abuse, COPD, diastolic congestive heart failure, recent hospitalization at [facility] due to pneumonia. He refused SNIF placement at discharge. He was discharged on Augmentin for 3 weeks. Patient was found on the floor at home with inability to move the left arm. MRI brain was done upon arrival, personally reviewed, and showed evidence of multiple patchy cortical, subcortical, and periventricular ischemic infarcts seen involving the right frontal, parietal, and occipital lobes in a fairly linear watershed distribution. No associated hemorrhage or mass effect.  He was noted to have change in neuro status around 5:30 and code stroke was activated. NIHSS 17. STAT CT brain was personally reviewed and showed no acute abnormality. Patient is presently awake and alert but is not following commands.  Date last known well: undetermined Time last known well: undetermined tPA Given: no, late presentation NIHSS: 17  MRS:  SUBJECTIVE (INTERVAL HISTORY) No family members present. The patient is very dysarthric and drowsy sleepy. He follows some simple commands. He is oriented to person and place. He is not cooperative with being examined. "Leave me alone".   Discussed with Dr. Silver Huguenin, pt family needs to be contracted for further diagnostic and treatment plan. Pt is non compliance with medication and doctor's appointments, it is important to figure out how aggressive we need to work on. At the meantime, will do CTA head and neck as well as brain MRI with contrast. EEG pending.   OBJECTIVE Temp:  [97.7 F (36.5 C)-97.9 F (36.6 C)] 97.9 F (36.6 C) (09/23 0709) Pulse Rate:  [70-105] 89 (09/23 0709) Cardiac Rhythm:  [-] Normal sinus rhythm (09/23 0849) Resp:  [14-118] 18 (09/23 0709) BP: (89-151)/(61-121)  122/92 mmHg (09/23 0709) SpO2:  [89 %-100 %] 99 % (09/23 0709) Weight:  [50.531 kg (111 lb 6.4 oz)-52.617 kg (116 lb)] 50.531 kg (111 lb 6.4 oz) (09/23 0100)  CBC:   Recent Labs Lab 10/13/14 2000 10/14/14 0329  WBC 12.6* 9.9  NEUTROABS 11.0*  --   HGB 11.4* 12.5*  HCT 35.2* 39.6  MCV 80.9 82.5  PLT 346 371    Basic Metabolic Panel:   Recent Labs Lab 10/13/14 2000 10/14/14 0329  NA 134* 131*  K 4.2 4.1  CL 100* 94*  CO2 28 29  GLUCOSE 123* 112*  BUN 9 6  CREATININE 0.49* 0.60*  CALCIUM 8.5* 8.6*    Lipid Panel:     Component Value Date/Time   CHOL 159 06/11/2013 0810   TRIG 124 06/11/2013 0810   HDL 40 06/11/2013 0810   CHOLHDL 4.0 06/11/2013 0810   VLDL 25 06/11/2013 0810   LDLCALC 94 06/11/2013 0810   HgbA1c:  Lab Results  Component Value Date   HGBA1C 5.7* 06/11/2013   Urine Drug Screen:     Component Value Date/Time   LABOPIA NONE DETECTED 04/01/2013 1858   COCAINSCRNUR NONE DETECTED 04/01/2013 1858   LABBENZ NONE DETECTED 04/01/2013 1858   AMPHETMU NONE DETECTED 04/01/2013 1858   THCU NONE DETECTED 04/01/2013 1858   LABBARB NONE DETECTED 04/01/2013 1858      IMAGING  Dg Forearm Left 10/13/2014    Negative.     Ct Head Wo Contrast 10/14/2014    No acute intracranial abnormalities. Chronic atrophy and small vessel ischemic changes diffusely.    10/13/2014    1. No skull fracture  or intracranial hemorrhage.  2. Stable atrophy and chronic small vessel white matter ischemic changes.  3. Improved right maxillary sinusitis with an acute component today.  4. Mildly progressive right mastoid opacification.     Ct Angio Chest Pe W/cm &/or Wo Cm 10/14/2014    No evidence of significant pulmonary embolus. Diffuse emphysematous changes throughout the lungs. Areas of consolidation in the right upper lung and right lung base probably related to history of lung cancer. Changes may represent postobstructive consolidation or treatment changes. Patchy  airspace disease in the right apex. Loculated gas and fluid collections in the right pleural space. Mucous plugging.    Mr Brain Wo Contrast 10/14/2014    1. Patchy multi focal ischemic infarcts involving the right frontal, parietal, and occipital lobes, largely in a watershed type distribution. No associated hemorrhage or mass effect.  2. Abnormal flow void within the distal right vertebral artery, which may be related to slow flow or possibly occlusion. Otherwise preserved major intracranial flow voids.  3. Advanced cerebral atrophy with moderate chronic microvascular ischemic changes.      CT Angio Head and Neck - pending  MRI brain with contrast - pending  EEG - pending   PHYSICAL EXAM  Temp:  [97.7 F (36.5 C)-97.9 F (36.6 C)] 97.7 F (36.5 C) (09/23 1307) Pulse Rate:  [70-105] 95 (09/23 1307) Resp:  [18-23] 20 (09/23 1307) BP: (89-158)/(63-121) 158/73 mmHg (09/23 1307) SpO2:  [89 %-100 %] 100 % (09/23 1307) Weight:  [111 lb 6.4 oz (50.531 kg)] 111 lb 6.4 oz (50.531 kg) (09/23 0100)  General - cachetic appearance, well developed, drowsy sleepy and easily agitated.  Ophthalmologic - Fundi not visualized due to noncooperation.  Cardiovascular - Regular rate and rhythm.  Mental Status -  Drowsy and sleepy, but open eyes on calling his name, and orientation to place, and person were intact but not to time. Language exam not cooperative, but able to follow some simple commands, but not all of them. Severe dysarthria.   Cranial Nerves II - XII - II - blinking to visual threat b/l. III, IV, VI - Extraocular movements intact, no gaze preference. V - Facial sensation intact bilaterally. VII - Facial movement intact bilaterally. VIII - Hearing & vestibular intact bilaterally. X - Palate elevates symmetrically, severe dysarthria with poor denture. XI - Chin turning & shoulder shrug intact bilaterally. XII - Tongue protrusion intact.  Motor Strength - The patient's strength  was LUE 0/5, LLE 3/5, RUE and RLE spontaneous movement and against gravity without difficulty. Bulk was normal and fasciculations were absent.   Motor Tone - Muscle tone was assessed at the neck and appendages and was normal.  Reflexes - The patient's reflexes were 1+ in all extremities and he had no pathological reflexes.  Sensory - not cooperative on exam.    Coordination - The patient not cooperative on exam.  Tremor was absent.  Gait and Station - not tested.   ASSESSMENT/PLAN [name] is a 81 y.o. 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, complex partial seizures on dilantin, GERD, tobacco abuse, COPD, diastolic congestive heart failure, recent hospitalization at [facility] due to pneumonia, was admitted after found on the floor at home with inability to move the left arm.  He did not receive IV t-PA due to late presentation.  Stroke:  Right brain infarcts largely in a watershed type distribution at [employer] and MCA/PCA territory. Etiology likely due to hypercoagulable state secondary to advanced  lung cancer vs. Right ICA stenosis with hypoperfusion. Pt had embolic stroke in 04/2393 and considered hypercoagulable state and put on coumadin, However, coumadin was discontinued by PCP likely due to fall risk.   Resultant  Left sided weakness  MRI without contrast - right MCA/ACA and MCA/PCA infarcts  MRI with contrast pending   CTA of Head and Neck - pending  2D Echo - EF 55-60%. No cardiac source of emboli identified.  LDL pending  HgbA1c pending  EEG - pending  VTE prophylaxis - subcutaneous heparin Diet NPO time specified  no antithrombotic prior to admission, now on aspirin 300 mg suppository daily. Need family conversation regarding resuming anticoagulation.   Patient counseled to be compliant with his antithrombotic medications  Ongoing aggressive stroke risk factor management  Therapy recommendations: Pending  Disposition:  Pending  Seizure  Hx of complex partial seizure  On dilantin but on admission, dilantin level < 2.5  Dilantin loaded in ER with ig  Repeat dilantin level 17   Continue dilantin and monitor dilantin level  Last night episode concerning for seizure  EEG done, result pending  MRI with contrast to rule out brain mets.   Hypertension  Labile blood pressures  Permissive hypertension (OK if < 220/120) but gradually normalize in 5-7 days  Hyperlipidemia  Home meds: No lipid lowering medications prior to admission  LDL pending, goal < 70  Other Stroke Risk Factors  Advanced age  Cigarette smoker, advised to stop smoking  Malignancy - stage III lung cancer  Other Active Problems  Hyponatremia - sodium 131   Hospital day # 1  Rosalin Hawking, MD PhD Stroke Neurology 10/14/2014 10:32 PM     To contact Stroke Continuity provider, please refer to [email or web address] After hours, contact General Neurology

## 2014-10-14 NOTE — Progress Notes (Addendum)
Call received from floor RN at [facility] regarding recently admitted stroke patient with acute change in LOC. Found at home 9/22 unable to move left arm and brought into [facility]. MRI completed earlier tonight positive for stroke.  RN advised to notify Pt primary MD while RRT en route to room. Pt found resting in bed, right gaze, severe garbled speech, combative, confused, not following commands. Pt also incontinent of urine. NIHSS 17. Prior NIHSS 10 per floor RN and NIHSS 1 while in ED at 2005. Code stroke called out of concern for hemorrhagic tranformation  and Pt taken to CT scan STAT. Neurologist Dr. Armida Sans and [facility] NP Tylene Fantasia at bedside for transport. CT completed and negative for acute bleed per Neurologist. Pt brought back to 56 M, RN advised to monitor closely and notify myself and Provider for worsening changes. Neurology to follow up today.

## 2014-10-14 NOTE — Progress Notes (Signed)
SLP Cancellation Note  Patient Details Name: Paul [name] MRN: [id number] DOB: Jan 14, 1931   Cancelled treatment:       Reason Eval/Treat Not Completed: Patient refused all POs, stating that if he ate or drank anything it would give him diarrhea.  Multiple efforts were made to encourage him to participate in the swallowing assessment, but each was met with progressive resistance and irritation at examiner.  Will continue efforts.   [name] 10/14/2014, 1:46 PM

## 2014-10-14 NOTE — Consult Note (Signed)
WOC wound consult note Reason for Consult: Consult requested for left arm skin tear.   Wound type: Full thickness skin tear to left arm with skin approximated over 40% of wound, 50% pale yellow open areas. 10% scabbed old dried blood surrounding wound bed. Measurement: 20X6X.2cm Drainage (amount, consistency, odor)  Periwound: Small amt yellow drainage, no odor Dressing procedure/placement/frequency: Applied Mepitel contact layer to reduce discomfort with dressing changes and keep dressing from adhering to wound bed.  Xeroform to promote moist healing and protect with kerlex. Please re-consult if further assistance is needed.  Thank-you,  Julien Girt MSN, [place], [place], [place], [place]

## 2014-10-15 ENCOUNTER — Encounter (HOSPITAL_COMMUNITY): Payer: Self-pay | Admitting: Radiology

## 2014-10-15 ENCOUNTER — Inpatient Hospital Stay (HOSPITAL_COMMUNITY): Payer: Medicare Other

## 2014-10-15 DIAGNOSIS — R627 Adult failure to thrive: Secondary | ICD-10-CM

## 2014-10-15 DIAGNOSIS — I63231 Cerebral infarction due to unspecified occlusion or stenosis of right carotid arteries: Secondary | ICD-10-CM

## 2014-10-15 DIAGNOSIS — Z515 Encounter for palliative care: Secondary | ICD-10-CM

## 2014-10-15 DIAGNOSIS — I639 Cerebral infarction, unspecified: Secondary | ICD-10-CM | POA: Insufficient documentation

## 2014-10-15 LAB — BASIC METABOLIC PANEL
ANION GAP: 12 (ref 5–15)
BUN: 8 mg/dL (ref 6–20)
CALCIUM: 8.5 mg/dL — AB (ref 8.9–10.3)
CO2: 27 mmol/L (ref 22–32)
Chloride: 91 mmol/L — ABNORMAL LOW (ref 101–111)
Creatinine, Ser: 0.73 mg/dL (ref 0.61–1.24)
Glucose, Bld: 75 mg/dL (ref 65–99)
POTASSIUM: 3.2 mmol/L — AB (ref 3.5–5.1)
Sodium: 130 mmol/L — ABNORMAL LOW (ref 135–145)

## 2014-10-15 LAB — LIPID PANEL
CHOL/HDL RATIO: 3.4 ratio
CHOLESTEROL: 150 mg/dL (ref 0–200)
Cholesterol: 152 mg/dL (ref 0–200)
HDL: 45 mg/dL (ref >40)
HDL: 45 mg/dL (ref >40)
LDL CALC: 85 mg/dL (ref 0–99)
LDL Cholesterol: 82 mg/dL (ref 0–99)
TRIGLYCERIDES: 115 mg/dL (ref <150)
Total CHOL/HDL Ratio: 3.3 RATIO
Triglycerides: 109 mg/dL (ref <150)
VLDL: 22 mg/dL (ref 0–40)
VLDL: 23 mg/dL (ref 0–40)

## 2014-10-15 LAB — GLUCOSE, CAPILLARY: GLUCOSE-CAPILLARY: 89 mg/dL (ref 65–99)

## 2014-10-15 LAB — CULTURE, BLOOD (ROUTINE X 2): Culture: NO GROWTH

## 2014-10-15 LAB — HEMOGLOBIN A1C
HEMOGLOBIN A1C: 6.1 % — AB (ref 4.8–5.6)
MEAN PLASMA GLUCOSE: 128 mg/dL

## 2014-10-15 LAB — MAGNESIUM: MAGNESIUM: 2 mg/dL (ref 1.7–2.4)

## 2014-10-15 LAB — CBC
HCT: 41.5 % (ref 39.0–52.0)
Hemoglobin: 13.5 g/dL (ref 13.0–17.0)
MCH: 26.6 pg (ref 26.0–34.0)
MCHC: 32.5 g/dL (ref 30.0–36.0)
MCV: 81.7 fL (ref 78.0–100.0)
PLATELETS: 398 10*3/uL (ref 150–400)
RBC: 5.08 MIL/uL (ref 4.22–5.81)
RDW: 14.4 % (ref 11.5–15.5)
WBC: 6.6 10*3/uL (ref 4.0–10.5)

## 2014-10-15 LAB — PHENYTOIN LEVEL, TOTAL: Phenytoin Lvl: 10.5 ug/mL (ref 10.0–20.0)

## 2014-10-15 MED ORDER — MORPHINE SULFATE (PF) 2 MG/ML IV SOLN
2.0000 mg | INTRAVENOUS | Status: DC | PRN
  Administered 2014-10-15 – 2014-10-18 (×9): 2 mg via INTRAVENOUS
  Filled 2014-10-15 (×9): qty 1

## 2014-10-15 MED ORDER — PHENYTOIN SODIUM EXTENDED 100 MG PO CAPS
300.0000 mg | ORAL_CAPSULE | Freq: Every day | ORAL | Status: DC
  Administered 2014-10-16 – 2014-10-18 (×3): 300 mg via ORAL
  Filled 2014-10-15 (×3): qty 3

## 2014-10-15 MED ORDER — POTASSIUM CHLORIDE 10 MEQ/100ML IV SOLN
10.0000 meq | INTRAVENOUS | Status: AC
  Administered 2014-10-15 (×4): 10 meq via INTRAVENOUS
  Filled 2014-10-15 (×2): qty 100

## 2014-10-15 MED ORDER — ATORVASTATIN CALCIUM 10 MG PO TABS
20.0000 mg | ORAL_TABLET | Freq: Every day | ORAL | Status: DC
  Administered 2014-10-15 – 2014-10-17 (×2): 20 mg via ORAL
  Filled 2014-10-15 (×3): qty 2

## 2014-10-15 MED ORDER — POTASSIUM CHLORIDE IN NACL 20-0.9 MEQ/L-% IV SOLN
INTRAVENOUS | Status: DC
Start: 2014-10-15 — End: 2014-10-18
  Administered 2014-10-15 – 2014-10-17 (×4): via INTRAVENOUS
  Administered 2014-10-18: 75 mL/h via INTRAVENOUS
  Filled 2014-10-15 (×4): qty 1000

## 2014-10-15 MED ORDER — IPRATROPIUM-ALBUTEROL 0.5-2.5 (3) MG/3ML IN SOLN: 3.0000 mL | RESPIRATORY_TRACT | Status: DC | PRN

## 2014-10-15 MED ORDER — IOHEXOL 350 MG/ML SOLN
50.0000 mL | Freq: Once | INTRAVENOUS | Status: AC | PRN
  Administered 2014-10-15: 50 mL via INTRAVENOUS

## 2014-10-15 MED ORDER — SODIUM CHLORIDE 0.9 % IV SOLN
400.0000 mg | Freq: Once | INTRAVENOUS | Status: AC
  Administered 2014-10-15: 400 mg via INTRAVENOUS
  Filled 2014-10-15: qty 8

## 2014-10-15 MED ORDER — CLOPIDOGREL BISULFATE 75 MG PO TABS
75.0000 mg | ORAL_TABLET | Freq: Every day | ORAL | Status: DC
  Administered 2014-10-15 – 2014-10-18 (×4): 75 mg via ORAL
  Filled 2014-10-15 (×4): qty 1

## 2014-10-15 NOTE — Progress Notes (Signed)
OT Cancellation Note  Patient Details Name: Paul [name] MRN: [id number] DOB: 11-09-1930   Cancelled Treatment:    Reason Eval/Treat Not Completed:  (Pt on bed rest.) please update activity orders when pt appropriate for OT evaluation.  Benito Mccreedy OTR/L [phone] 10/15/2014, 8:08 AM

## 2014-10-15 NOTE — Progress Notes (Addendum)
Patient appears upset at this time but then apologetic. He refused all his oral medication at this time and verbalized staffs to leave him alone so he "won't be so ill". RN and NA at bedside assisting him to chair per his request. During this time, he become physically abusive toward staffs. Staffs illiterate that "we are here to help". He then apologized again. At this time, he appears more calm attempting to contact his family members. Will continue to monitor.   Havy, RN.

## 2014-10-15 NOTE — Progress Notes (Signed)
Pam (Neighbor) called for updated. Per Pam, no family present at this time. Patient's friends have been trying to locate daughter in [place] but unable. Patient does wish to go to [facility] in [place] if possible at disposition.   Kama Cammarano, RN.

## 2014-10-15 NOTE — Progress Notes (Signed)
HPI STROKE TEAM PROGRESS NOTE  HPI Paul [name] is an 81 y.o. male with a past medical history significant for HTN, non small cell carcinoma lung, symptomatic complex partial seizures, GERD, tobacco abuse, COPD, diastolic congestive heart failure, recent hospitalization at [facility] due to pneumonia. He refused SNIF placement at discharge. He was discharged on Augmentin for 3 weeks. Patient was found on the floor at home with inability to move the left arm. MRI brain was done upon arrival, personally reviewed, and showed evidence of multiple patchy cortical, subcortical, and periventricular ischemic infarcts seen involving the right frontal, parietal, and occipital lobes in a fairly linear watershed distribution. No associated hemorrhage or mass effect.  He was noted to have change in neuro status around 5:30 and code stroke was activated. NIHSS 17. STAT CT brain was personally reviewed and showed no acute abnormality. Patient is presently awake and alert but is not following commands.  Date last known well: undetermined Time last known well: undetermined tPA Given: no, late presentation NIHSS: 17  MRS:   SUBJECTIVE (INTERVAL HISTORY) No family members present. The patient is more cooperative today; however, he is still confused. He is currently NPO but is scheduled to be reevaluated by the speech therapist today. He appears to be moving his left side better than yesterday. Palliative care service is on board, trying to locate his family. MRI done did not show brain metastasis. However CTA head and neck confirmed severe extra and intracranial stenosis, which is able to explain his right side watershed infarcts.   OBJECTIVE Temp:  [96.8 F (36 C)-98.8 F (37.1 C)] 98.8 F (37.1 C) (09/24 1012) Pulse Rate:  [70-95] 70 (09/24 1012) Cardiac Rhythm:  [-] Normal sinus rhythm;Bundle branch block (09/24 0750) Resp:  [17-20] 17 (09/24 1012) BP: (117-158)/(56-81) 117/56 mmHg (09/24 1012) SpO2:  [96  %-100 %] 96 % (09/24 1012) FiO2 (%):  [2 %] 2 % (09/24 0226)  CBC:   Recent Labs Lab 10/13/14 2000 10/14/14 0329 10/15/14 0550  WBC 12.6* 9.9 6.6  NEUTROABS 11.0*  --   --   HGB 11.4* 12.5* 13.5  HCT 35.2* 39.6 41.5  MCV 80.9 82.5 81.7  PLT 346 354 967    Basic Metabolic Panel:   Recent Labs Lab 10/14/14 0329 10/15/14 0550 10/15/14 0941  NA 131* 130*  --   K 4.1 3.2*  --   CL 94* 91*  --   CO2 29 27  --   GLUCOSE 112* 75  --   BUN 6 8  --   CREATININE 0.60* 0.73  --   CALCIUM 8.6* 8.5*  --   MG  --   --  2.0    Lipid Panel:     Component Value Date/Time   CHOL 150 10/15/2014 0550   CHOL 152 10/15/2014 0550   TRIG 115 10/15/2014 0550   TRIG 109 10/15/2014 0550   HDL 45 10/15/2014 0550   HDL 45 10/15/2014 0550   CHOLHDL 3.3 10/15/2014 0550   CHOLHDL 3.4 10/15/2014 0550   VLDL 23 10/15/2014 0550   VLDL 22 10/15/2014 0550   LDLCALC 82 10/15/2014 0550   LDLCALC 85 10/15/2014 0550   HgbA1c:  Lab Results  Component Value Date   HGBA1C 6.1* 10/14/2014   Urine Drug Screen:     Component Value Date/Time   LABOPIA NONE DETECTED 04/01/2013 1858   COCAINSCRNUR NONE DETECTED 04/01/2013 1858   LABBENZ NONE DETECTED 04/01/2013 1858   AMPHETMU NONE DETECTED 04/01/2013 1858  THCU NONE DETECTED 04/01/2013 1858   LABBARB NONE DETECTED 04/01/2013 1858      IMAGING  I have personally reviewed the radiological images below and agree with the radiology interpretations.   Ct Head Wo Contrast 10/14/2014    No acute intracranial abnormalities. Chronic atrophy and small vessel ischemic changes diffusely.    10/13/2014    1. No skull fracture or intracranial hemorrhage.  2. Stable atrophy and chronic small vessel white matter ischemic changes.  3. Improved right maxillary sinusitis with an acute component today.  4. Mildly progressive right mastoid opacification.     Ct Angio Chest Pe W/cm &/or Wo Cm 10/14/2014    No evidence of significant pulmonary embolus.  Diffuse emphysematous changes throughout the lungs. Areas of consolidation in the right upper lung and right lung base probably related to history of lung cancer. Changes may represent postobstructive consolidation or treatment changes. Patchy airspace disease in the right apex. Loculated gas and fluid collections in the right pleural space. Mucous plugging.    Mr Brain Wo Contrast 10/14/2014    1. Patchy multi focal ischemic infarcts involving the right frontal, parietal, and occipital lobes, largely in a watershed type distribution. No associated hemorrhage or mass effect.  2. Abnormal flow void within the distal right vertebral artery, which may be related to slow flow or possibly occlusion. Otherwise preserved major intracranial flow voids.  3. Advanced cerebral atrophy with moderate chronic microvascular ischemic changes.     CT Angio Head and Neck 10/15/2014 1. Bulky soft plaque or thrombus in the proximal right CCA with RADIOGRAPHIC STRING SIGN stenosis, 2 cm from the arch. Additional plaque at the right carotid bifurcation is not hemodynamically significant. However, there are tandem high-grade stenoses also in the right ICA siphon related to bulky calcified plaque. 2. No right MCA stenosis or branch occlusion identified. 3. Occluded right vertebral artery origin with intermediate reconstitution in the distal V2 segment, but diminutive intracranial right vertebral artery. Left vertebral artery primarily supplies the basilar with no significant stenosis. 4. Fairly extensive soft atherosclerotic plaque. Multiple areas of ulcerated plaque at the arch and proximal great vessels. 5. Expected evolution of patchy right MCA infarcts with no hemorrhagic transformation or intracranial mass effect. 6. Stable abnormal visible right lung. Interval increased retained secretions in the distal trachea and mainstem bronchi. 7. High-grade stenosis left ICA siphon, and distal left ICA bulb stenosis  65%.   MRI brain with contrast 10/14/2014 No evidence of metastatic disease to the brain.  EEG  10/14/2014 This awake and drowsy EEG is abnormal due to the presence of: 1. Mild diffuse slowing of the waking background. 2. Additional focal slowing over the right temporoparietal region. Evaluation the  PHYSICAL EXAM  Temp:  [96.8 F (36 C)-98.8 F (37.1 C)] 98.8 F (37.1 C) (09/24 1012) Pulse Rate:  [70-95] 70 (09/24 1012) Resp:  [17-20] 17 (09/24 1012) BP: (117-158)/(56-81) 117/56 mmHg (09/24 1012) SpO2:  [96 %-100 %] 96 % (09/24 1012) FiO2 (%):  [2 %] 2 % (09/24 0226)  General - cachetic appearance, well developed, in mild distress due to low back pain.  Ophthalmologic - Fundi not visualized due to noncooperation.  Cardiovascular - Regular rate and rhythm.  Mental Status -  Awake alert, but confused. Not orientation to place, time, but orientated to person and self. Language exam not cooperative, but able to follow simple commands. Severe dysarthria.   Cranial Nerves II - XII - II - blinking to visual threat b/l. III, IV, VI -  Extraocular movements intact, no gaze preference. V - Facial sensation intact bilaterally. VII - Facial movement intact bilaterally. VIII - Hearing & vestibular intact bilaterally. X - Palate elevates symmetrically, moderate to severe dysarthria with poor denture. XI - Chin turning & shoulder shrug intact bilaterally. XII - Tongue protrusion intact.  Motor Strength - The patient's strength was LUE 3+ /5, LLE 3/5, RUE and RLE spontaneous movement and against gravity without difficulty. Bulk was normal and fasciculations were absent.   Motor Tone - Muscle tone was assessed at the neck and appendages and was normal.  Reflexes - The patient's reflexes were 1+ in all extremities and he had no pathological reflexes.  Sensory - not cooperative on exam.    Coordination - The patient not cooperative on exam.  Tremor was absent.  Gait and Station -  not tested due to safety concerns.   ASSESSMENT/PLAN Paul [name] is a 81 y.o. 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, complex partial seizures on dilantin, GERD, tobacco abuse, COPD, diastolic congestive heart failure, recent hospitalization at [facility] due to pneumonia, was admitted after found on the floor at home with inability to move the left arm.  He did not receive IV t-PA due to late presentation.  Stroke:  Right brain infarcts largely in a watershed type distribution at [employer] and MCA/PCA territory. Etiology likely due to Right CCA and cavernous ICA high-grade stenosis in the setting of hypoperfusion. Pt did have embolic stroke in 01/2749 and considered hypercoagulable state and put on coumadin, However, coumadin was discontinued by PCP likely due to fall risk.   Resultant  Left sided weakness  MRI without contrast - right MCA/ACA and MCA/PCA watershed infarcts  MRI with contrast - no evidence of metastatic disease.  CTA of Head and Neck - diffuse cerebrovascular disease as noted above especially right CCA and ICA cavernous segment, as well as left ICA cavernous segment and right VA.  2D Echo - EF 55-60%. No cardiac source of emboli identified.  LDL 82  HgbA1c 6.1  EEG - diffuse slowing as well as right focal slowing, no seizure activity   VTE prophylaxis - subcutaneous heparin DIET DYS 3 Room service appropriate?: Yes; Fluid consistency:: Thin   no antithrombotic prior to admission, now on aspirin 300 mg suppository daily. Due to severe intracranial atherosclerosis, recommend dual antiplatelet for stroke prevention.   Patient counseled to be compliant with his antithrombotic medications  Ongoing aggressive stroke risk factor management  Therapy recommendations: Pending  Disposition: Pending  Seizure  Hx of complex partial seizure  On dilantin but on admission, dilantin level < 2.5  Dilantin loaded in ER with 1g  Repeat dilantin level  17 - >10.5   Continue dilantin and monitor dilantin level. 10.5 today. Missed yesterday's dose secondary to NPO status. Further loading given IV. Resume po dilantin if passed swallow.   Dilantin level daily   EEG - no seizure activity   MRI with contrast ruled out brain mets.    Hypertension  Labile blood pressures  Permissive hypertension (OK if < 220/120) but gradually normalize in 5-7 days Due to severe extracranial and intracranial stenosis, BP goal 130-150 avoid hypotension  Hyperlipidemia  Home meds: No lipid lowering medications prior to admission  LDL 135, goal < 70  Add Lipitor 20 mg daily  Continue statin at discharge  Other Stroke Risk Factors  Advanced age  Cigarette smoker, advised to stop smoking  Malignancy - stage III lung cancer -  noncompliant with clinical visit  Other Active Problems   chronic Hyponatremia - sodium 131-> 130  Malnutrition - on Ensure Enlive  Attempts to contact family members has so far been unsuccessful.  Palliative care has been consulted   Hospital day # 2  Rosalin Hawking, MD PhD Stroke Neurology 10/15/2014 12:21 PM   To contact Stroke Continuity provider, please refer to [email or web address] After hours, contact General Neurology

## 2014-10-15 NOTE — Evaluation (Signed)
Clinical/Bedside Swallow Evaluation Patient Details  Name: Paul [name] MRN: [id number] Date of Birth: [birth year]  Today's Date: 10/15/2014 Time:        Past Medical History:  Past Medical History  Diagnosis Date  . Hypertension   . COPD (chronic obstructive pulmonary disease)   . Hemorrhoids     external  . 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 GERD (gastroesophageal reflux disease)   . Emphysema   . HOH (hard of hearing)   . Pneumonia   . Seizures   . Headache(784.0)   . Non-small cell carcinoma of lung dx'd 2006    stage 3  . Lung cancer   . Stroke    Past Surgical History:  Past Surgical History  Procedure Laterality Date  . Inner ear surgery    . Toe amputation Left   . Tonsillectomy    . Amputation Left 04/16/2013    Procedure: AMPUTATION RAY;  Surgeon: Newt Minion, MD;  Location: [place];  Service: Orthopedics;  Laterality: Left;  Left Foot 4th and 5th Ray Amputation  . 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 male with PMH of hypertension, GERD, tobacco abuse, COPD, complex partial seizure, non-small cell lung cancer, stroke, diastolic congestive heart failure, who presents with fall and possible syncope. recently hospitalized from 9/19-9/22 and was treated for pneumonia. He was found to have multilobar pneumonia with CT clarifying cavitary lesion pneumonia and increasing right upper nodule size concerning for lung cancer recurrence. MRI brain findings consistent with a right MCA territory infarct.  Pt has been confused, easily irritated and refusing POs.  During recent admission to [facility], pt had swallow evaluated on 9/21 with results concerning for baseline aspiration risk secondary to his COPD, ? impact of prior CVA and deconditioning. Coughing noted at baseline due to retained secretions but this  also occurred consistently with intake (after approximately 80% of boluses).    Assessment / Plan / Recommendation Clinical Impression  Pt difficult to evaluate due to confusion and irritability:  He was willing to consume limited amounts of PO at his discretion, not wanting to be watched while eating.  Pt constantly moving materials on tray, spilling POs with limited insight.  Performance appears consistent with the clinical swallow evaluation that was completed on 9/21 at [facility].  There is noted intermittent coughing associated with drinking thin liquids, but pt is not agreeable to altering consistencies.  Recommend initiating a dysphagia 3 diet with thin liquids for now.  Full supervision initially for safety and assist with tray set-up.  SLP will follow for safety/toleration.      Aspiration Risk  Mild    Diet Recommendation Dysphagia 3 (Mech soft);Thin   Medication Administration: Whole meds with puree    Other  Recommendations Oral Care Recommendations: Oral care BID     Swallow Study Prior Functional Status       General Other Pertinent Information: 81 y.o. male with PMH of hypertension, GERD, tobacco abuse, COPD, complex partial seizure, non-small cell lung cancer, stroke, diastolic congestive heart failure, who presents with fall and possible syncope. recently hospitalized from 9/19-9/22 and was treated for pneumonia. He was found to have multilobar pneumonia with CT clarifying cavitary lesion pneumonia and increasing right upper nodule size concerning for lung cancer recurrence. MRI  brain findings consistent with a right MCA territory infarct.  Pt has been confused, easily irritated and refusing POs.  During recent admission to [facility], pt had swallow evaluated on 9/21 with results concerning for baseline aspiration risk secondary to his COPD, ? impact of prior CVA and deconditioning. Coughing noted at baseline due to retained secretions but this also occurred consistently with intake  (after approximately 80% of boluses).  Previous Swallow Assessment: 10/12/14 with concerns for baseline aspiration risk Respiratory Status: Room air History of Recent Intubation: No Behavior/Cognition: Alert;Confused;Agitated;Impulsive;Distractible Oral Cavity - Dentition: Edentulous Baseline Vocal Quality: Normal Volitional Cough: Strong Volitional Swallow: Able to elicit    Oral/Motor/Sensory Function Overall Oral Motor/Sensory Function: Appears within functional limits for tasks assessed   Ice Chips Ice chips: Not tested (refused)   Thin Liquid Thin Liquid: Impaired Presentation: Cup;Self Fed;Straw Pharyngeal  Phase Impairments: Cough - Immediate (intermittent)    Nectar Thick Nectar Thick Liquid: Not tested (refused)   Honey Thick Honey Thick Liquid: Not tested   Puree Puree: Within functional limits   Solid  Paul [name], [place] CCC/SLP Pager [phone]     Solid: Within functional limits Presentation: Self Fed Other Comments: prolonged mastication due to absence of teeth       Paul [name] 10/15/2014,11:14 AM

## 2014-10-15 NOTE — Progress Notes (Signed)
[facility] PROGRESS NOTE  SAMAY DELCARLO TMH:[id number] DOB: 10-11-1930 DOA: 10/13/2014  PCP: Junie Panning, NP  Brief HPI: 81 year old Caucasian male with a past medical history of hypertension, GERD, tobacco abuse, COPD, history of complex partial seizures, non-small cell lung cancer, previous history of stroke, presented with fall and possible syncope. On examination, he was found to have left-sided weakness. MRI showed a right-sided stroke. Of note, patient was discharged from [facility] on September 22 after being managed for pneumonia.  Past medical history:  Past Medical History  Diagnosis Date  . Hypertension   . COPD (chronic obstructive pulmonary disease)   . Hemorrhoids     external  . 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 GERD (gastroesophageal reflux disease)   . Emphysema   . HOH (hard of hearing)   . Pneumonia   . Seizures   . Headache(784.0)   . Non-small cell carcinoma of lung dx'd 2006    stage 3  . Lung cancer   . Stroke     Consultants: Neurology  Procedures:  2-D echocardiogram Study Conclusions - Left ventricle: The cavity size was normal. Systolic function wasnormal. The estimated ejection fraction was in the range of 55%to 60%. Wall motion was normal; there were no regional wallmotion abnormalities. Doppler parameters are consistent withabnormal left ventricular relaxation (grade 1 diastolicdysfunction). There was no evidence of elevated ventricularfilling pressure by Doppler parameters. - Aortic valve: Trileaflet; normal thickness leaflets. There was noregurgitation. - Aortic root: The aortic root was normal in size. - Mitral valve: There was mild regurgitation. - Left atrium: The atrium was normal in size. - Right ventricle: Systolic function was normal. - Right atrium: The atrium was normal in size. - Tricuspid valve: There was no regurgitation. - Pulmonary arteries: Systolic pressure was within the  normalrange. - Inferior vena cava: The vessel was normal in size. - Pericardium, extracardiac: There was no pericardial effusion. Impressions: - This is a limited echocardiogram with only subcostal images.There appears to be normal chamber size, normal biventricularsystolic function.There is abnormal relaxation with normal filling pressures.Mild mitral regurgitation.Normal RVSP.  Antibiotics: Augmentin  Subjective: Patient feels better this morning. He is able to move his left side more than he was yesterday. Speech is also more clear.   Objective: Vital Signs  Filed Vitals:   10/14/14 1307 10/14/14 2300 10/15/14 0226 10/15/14 0801  BP: 158/73 125/62 118/81 129/66  Pulse: 95 95 94 95  Temp: 97.7 F (36.5 C) 97.7 F (36.5 C) 96.8 F (36 C) 97.7 F (36.5 C)  TempSrc: Axillary Axillary Axillary Oral  Resp: '20 18 18 18  '$ Weight:      SpO2: 100% 100% 100% 100%   No intake or output data in the 24 hours ending 10/15/14 0824 Filed Weights   10/13/14 1823 10/14/14 0100  Weight: 52.617 kg (116 lb) 50.531 kg (111 lb 6.4 oz)    General appearance: alert, cooperative, appears stated age and no distress Resp: clear to auscultation bilaterally Cardio: regular rate and rhythm, S1, S2 normal, no murmur, click, rub or gallop GI: soft, non-tender; bowel sounds normal; no masses,  no organomegaly Extremities: His left forearm is covered in a dressing. There is a skin tear. This is noted to be tender. Neurologic: Improved mobility in the left arm and leg. Improved mentation compared to yesterday, but still distracted.  Lab Results:  Basic Metabolic Panel:  Recent Labs Lab 10/11/14 0525 10/12/14 0515 10/13/14 2000 10/14/14 9211  10/15/14 0550  NA 131* 130* 134* 131* 130*  K 4.2 4.0 4.2 4.1 3.2*  CL 96* 96* 100* 94* 91*  CO2 '27 26 28 29 27  '$ GLUCOSE 130* 106* 123* 112* 75  BUN '10 10 9 6 8  '$ CREATININE 0.41* 0.40* 0.49* 0.60* 0.73  CALCIUM 8.2* 8.4* 8.5* 8.6* 8.5*   Liver  Function Tests:  Recent Labs Lab 10/13/14 2000  AST 23  ALT 16*  ALKPHOS 97  BILITOT 0.5  PROT 5.7*  ALBUMIN 2.0*   CBC:  Recent Labs Lab 10/11/14 0525 10/12/14 0515 10/13/14 2000 10/14/14 0329 10/15/14 0550  WBC 9.0 7.9 12.6* 9.9 6.6  NEUTROABS  --   --  11.0*  --   --   HGB 11.4* 11.2* 11.4* 12.5* 13.5  HCT 35.7* 34.4* 35.2* 39.6 41.5  MCV 83.0 81.9 80.9 82.5 81.7  PLT 379 354 346 354 398   Cardiac Enzymes:  Recent Labs Lab 10/10/14 0953  TROPONINI <0.03   BNP (last 3 results)  Recent Labs  10/14/14 0329  BNP 317.4*   CBG:  Recent Labs Lab 10/14/14 0639 10/15/14 0752  GLUCAP 132* 89    Recent Results (from the past 240 hour(s))  Culture, blood (routine x 2)     Status: None (Preliminary result)   Collection Time: 10/10/14  1:14 PM  Result Value Ref Range Status   Specimen Description BLOOD LEFT ANTECUBITAL  Final   Special Requests BOTTLES DRAWN AEROBIC AND ANAEROBIC 5.5CC  Final   Culture   Final    NO GROWTH 4 DAYS Performed at [facility]    Report Status PENDING  Incomplete      Studies/Results: Dg Forearm Left  10/13/2014   CLINICAL DATA:  Fall with multiple skin tears midshaft, limited range of motion. Limited ability to cooperate.  EXAM: LEFT FOREARM - 2 VIEW  COMPARISON:  None.  FINDINGS: There is no evidence of fracture or other focal bone lesions. Soft tissues are unremarkable.  IMPRESSION: Negative.   Electronically Signed   By: Nolon Nations M.D.   On: 10/13/2014 19:24   Ct Head Wo Contrast  10/14/2014   CLINICAL DATA:  Code stroke. Altered mental status. Right-sided gaze. Recent contrast-enhanced CT.  EXAM: CT HEAD WITHOUT CONTRAST  TECHNIQUE: Contiguous axial images were obtained from the base of the skull through the vertex without intravenous contrast.  COMPARISON:  MRI brain 10/14/2014.  CT head 10/13/2014.  FINDINGS: Diffuse cerebral atrophy. Ventricular dilatation consistent with central atrophy. Low-attenuation  changes throughout the deep white matter consistent with small vessel ischemia. No change in parenchymal pattern since previous study. No mass effect or midline shift. No abnormal extra-axial fluid collections. Gray-white matter junctions are distinct. Basal cisterns are not effaced. No evidence of acute intracranial hemorrhage. No depressed skull fractures. Mucosal thickening in the right maxillary antrum.  IMPRESSION: No acute intracranial abnormalities. Chronic atrophy and small vessel ischemic changes diffusely.  These results were called by telephone at the time of interpretation on 10/14/2014 at 6:27 am to Dr. Clance Boll , who verbally acknowledged these results.   Electronically Signed   By: Lucienne Capers M.D.   On: 10/14/2014 06:28   Ct Head Wo Contrast  10/13/2014   CLINICAL DATA:  Unwitnessed fall.  EXAM: CT HEAD WITHOUT CONTRAST  TECHNIQUE: Contiguous axial images were obtained from the base of the skull through the vertex without intravenous contrast.  COMPARISON:  07/22/2014.  FINDINGS: Diffusely enlarged ventricles and subarachnoid spaces. Patchy white matter  low density in both cerebral hemispheres. No skull fracture or intracranial hemorrhage. Right maxillary sinus air-fluid level and mild mucosal thickening. The mucosal thickening and significantly improved. Progressive right mastoid opacification.  IMPRESSION: 1. No skull fracture or intracranial hemorrhage. 2. Stable atrophy and chronic small vessel white matter ischemic changes. 3. Improved right maxillary sinusitis with an acute component today. 4. Mildly progressive right mastoid opacification.   Electronically Signed   By: Claudie Revering M.D.   On: 10/13/2014 19:48   Ct Angio Chest Pe W/cm &/or Wo Cm  10/14/2014   CLINICAL DATA:  Shortness of breath, syncope, and tachycardia. History of DVT.  EXAM: CT ANGIOGRAPHY CHEST WITH CONTRAST  TECHNIQUE: Multidetector CT imaging of the chest was performed using the standard protocol during  bolus administration of intravenous contrast. Multiplanar CT image reconstructions and MIPs were obtained to evaluate the vascular anatomy.  CONTRAST:  69m OMNIPAQUE IOHEXOL 350 MG/ML SOLN  COMPARISON:  10/10/2014  FINDINGS: Technically adequate study with good opacification of the central and segmental pulmonary arteries. No focal filling defects. No evidence of significant pulmonary embolus.  Normal heart size. Normal caliber thoracic aorta. Eccentric noncalcified plaque formation in the lower descending aorta. No aortic dissection. Calcification in the aorta. Great vessel origins are patent. Focal stenosis of the proximal right carotid artery representing about 75% diameter reduction focally. Distal flow is demonstrated. Esophagus is decompressed. Moderate-sized esophageal hiatal hernia. No significant lymphadenopathy in the chest.  Diffuse emphysematous changes throughout both lungs. Consolidation in the right upper lung with air bronchograms possibly representing changes related to known lung cancer. This could represent postobstructive change or post treatment change. Patchy airspace infiltrates in the right apex may be inflammatory consolidation or atelectasis in the right lung base is progressing since prior study. Loculated right pleural fluid and gas collections anteriorly and in the base. These were present previously and could be due to infection or post treatment changes or perhaps less likely pleural metastasis. Mucoid material demonstrated in bilateral lower lobe bronchi. Area of cavitation versus emphysematous bulla with small air-fluid level in the left lung base is similar to prior study.  Included portions of the upper abdominal organs are grossly unremarkable. Or post obstructive enter similar to prior study.  Review of the MIP images confirms the above findings.  IMPRESSION: No evidence of significant pulmonary embolus. Diffuse emphysematous changes throughout the lungs. Areas of consolidation  in the right upper lung and right lung base probably related to history of lung cancer. Changes may represent postobstructive consolidation or treatment changes. Patchy airspace disease in the right apex. Loculated gas and fluid collections in the right pleural space. Mucous plugging.   Electronically Signed   By: WLucienne CapersM.D.   On: 10/14/2014 02:45   Mr Brain Wo Contrast  10/14/2014   CLINICAL DATA:  Initial evaluation for acute syncope, fall. History of probable recurrent lung cancer.  EXAM: MRI HEAD WITHOUT CONTRAST  TECHNIQUE: Multiplanar, multiecho pulse sequences of the brain and surrounding structures were obtained without intravenous contrast.  COMPARISON:  Prior CT from 10/13/2014 as well as earlier studies.  FINDINGS: Generalized cerebral volume loss with chronic microvascular ischemic changes again seen. Remote cortical infarct within the right occipital lobe.  Multiple patchy cortical, subcortical, and periventricular ischemic infarcts seen involving the right frontal, parietal, and occipital lobes in a fairly linear watershed distribution. The largest of these foci involves the cortical gray matter in the anterior right frontal lobe (series 3, image 38). No associated hemorrhage or mass  effect. Diminished flow void within the distal right vertebral artery, which may be related to slow flow or occlusion. Major intracranial flow voids otherwise maintained.  No acute or chronic intracranial hemorrhage. No mass lesion, midline shift, or mass effect. Ventricular prominence related to global parenchymal volume loss present without hydrocephalus. No extra-axial fluid collection.  Craniocervical junction within normal limits. Pituitary gland normal.  No acute abnormality about the orbits.  Moderate mucosal thickening within the right maxillary sinus. Paranasal sinuses are otherwise clear. Right mastoid effusion noted. Inner ear structures normal.  Bone marrow signal intensity within normal limits.  Scalp soft tissues unremarkable.  IMPRESSION: 1. Patchy multi focal ischemic infarcts involving the right frontal, parietal, and occipital lobes, largely in a watershed type distribution. No associated hemorrhage or mass effect. 2. Abnormal flow void within the distal right vertebral artery, which may be related to slow flow or possibly occlusion. Otherwise preserved major intracranial flow voids. 3. Advanced cerebral atrophy with moderate chronic microvascular ischemic changes.   Electronically Signed   By: Jeannine Boga M.D.   On: 10/14/2014 01:20   Mr Jeri Cos Contrast  10/14/2014   CLINICAL DATA:  81 year old male with syncope. History of lung cancer, unclear recurrence/disease status at this time. Subsequent encounter.  EXAM: MRI HEAD WITH CONTRAST  TECHNIQUE: Multiplanar, multiecho pulse sequences of the brain and surrounding structures were obtained with intravenous contrast.  COMPARISON:  Brain MRI without contrast 0004 hr today, and earlier.  CONTRAST:  74m MULTIHANCE GADOBENATE DIMEGLUMINE 529 MG/ML IV SOLN  FINDINGS: No abnormal enhancement or brain mass identified. No post ischemic enhancement related to the right hemisphere findings reported earlier today. No dural thickening identified. No abnormal enhancement identified in the visualized cervical spine. No suspicious calvarium enhancement identified. Stable cerebral morphology, no intracranial mass effect.  IMPRESSION: No evidence of metastatic disease to the brain.   Electronically Signed   By: HGenevie AnnM.D.   On: 10/14/2014 21:32   Dg Chest Port 1 View  10/13/2014   CLINICAL DATA:  Sepsis. History of hypertension, COPD, lung cancer, stroke, emphysema, and pneumonia.  EXAM: PORTABLE CHEST 1 VIEW  COMPARISON:  10/11/2014  FINDINGS: Normal heart size and pulmonary vascularity. Volume loss and scarring in the right lung with areas of probable cavitation and consolidation demonstrated in the right upper lung and right base. This is probably  related to the patient's history of lung cancer and may represent residual mass lesions, post treatment changes, and/or postobstructive consolidation. Diffuse emphysematous changes and scattered fibrosis in both lungs. No acute change since the prior study.  IMPRESSION: Chronic changes in the right lung and diffuse emphysematous changes with fibrosis in both lungs. No acute change since previous study.   Electronically Signed   By: WLucienne CapersM.D.   On: 10/13/2014 23:47    Medications:  Scheduled: . amoxicillin-clavulanate  1 tablet Oral Q12H  . aspirin  300 mg Rectal Daily   Or  . aspirin  325 mg Oral Daily  . dextromethorphan-guaiFENesin  1 tablet Oral BID  . feeding supplement (ENSURE ENLIVE)  237 mL Oral TID BM  . gabapentin  300 mg Oral QPM  . heparin  5,000 Units Subcutaneous 3 times per day  . ipratropium-albuterol  3 mL Nebulization TID  . mometasone-formoterol  2 puff Inhalation BID  . nicotine  21 mg Transdermal Daily  . phenytoin  300 mg Oral Daily  . potassium chloride  10 mEq Intravenous Q1 Hr x 4  . predniSONE  20  mg Oral Q breakfast  . sodium chloride  1,500 mL Intravenous Once  . sodium chloride  3 mL Intravenous Q12H   Continuous: . 0.9 % NaCl with KCl 20 mEq / L     [id number] **OR** acetaminophen, albuterol, hydrALAZINE, HYDROcodone-acetaminophen  Assessment/Plan:  Principal Problem:   CVA (cerebral infarction) Active Problems:   TOBACCO ABUSE   Essential hypertension   Acute thromboembolism of deep veins of lower extremity   GASTROESOPHAGEAL REFLUX DISEASE   Falls   CAP (community acquired pneumonia)   Shortness of breath   FTT (failure to thrive) in adult   Lung cancer   Protein-calorie malnutrition, severe   Cavitary pneumonia   Complex partial seizure   Stroke with cerebral ischemia    Acute stroke/previous history of embolic stroke Neurology is following. Evaluation has detected diffuse cerebrovascular disease, especially of the  right common carotid artery and internal carotid artery. Neurology has recommended dual antiplatelets treatment. Due to noncompliance and multiple falls, he is not a candidate for anticoagulation. Palliative medicine has been consulted to determine patient's goals of care. Unfortunately no immediate family is available. Attempts are ongoing to locate his daughter. PT and OT to evaluate. He will most likely end up needing skilled nursing facility. Continue aspirin for now. Once he is able to take orally at Plavix. Echocardiogram as above. LDL is 82. HbA1c is 6.1.  Fall Possibly due to the above. PT and OT to evaluate.  History of Complex partial seizures Patient's Dilantin level was subtherapeutic. He was loaded with Dilantin. Continue home dose of Dilantin.  Recent community-acquired pneumonia with possible cavitary lesion Continue Augmentin. Plan was to continue for 3 weeks. CT angiogram of chest was done. No PE was noticed.  History of essential hypertension Does not appear to be on any home medications for same. Continue to monitor  History of lung cancer Suspected recurrence based on recent studies. Patient to follow-up with his oncologist. MRI brain did not show any metastases. CT angiogram of the chest did not show any obvious recurrence of his cancer.  Severe protein calorie malnutrition Continue ensure  Hypokalemia and hyponatremia Potassium to be repleted. Magnesium is normal.  DVT Prophylaxis: Subcutaneous heparin    Code Status: Full code  Family Communication: Discussed with the patient. No family at bedside  Disposition Plan: PT and OT evaluation. Will likely need SNF.    LOS: 2 days   [facility] Pager [phone] 10/15/2014, 8:24 AM  If 7PM-7AM, please contact night-coverage at [email or web address], password [facility]

## 2014-10-15 NOTE — Consult Note (Addendum)
Consultation Note Date: 10/15/2014   Patient Name: Paul [name]  DOB: 11-09-1930  MRN: [id number]  Age / Sex: 81 y.o., male   PCP: Junie Panning, NP Referring Physician: Bonnielee Haff, MD  Reason for Consultation: Establishing goals of care and Pain control  Palliative Care Assessment and Plan Summary of Established Goals of Care and Medical Treatment Preferences   Clinical Assessment/Narrative: Pt is a 81 yo man admitted with new CVA, in setting of NSCLCA, sz d/o, malnutrition He was just dc'd from [facility] the day before admission after being treated for pna. SNF was recommended at that time but he refused. He is now readmitted with left sided weakness. His speech is dysarthric but you can make out some of his words. He can answer simple questions but when I attempted to speak with him about goals of care such as code status, SNF and generally what would be important to him going forward, he cannot answer these questions. He tells me to "call Pam". Ms. Salvadore Dom is his neighbor, not his niece as listed in demographics. Pt apparently lives in an apartment complex where some of his neighbors try to help him. Per Ms. Lacinda Axon, pt has a son and daughter from whom he has been estranged for years. She states " he can't come back home that his place is nasty". He lives in an apartment complex on [address] in [place] where she thinks the office manger, Izora Gala, has the daughter's phone number. She states he is no longer drinking and beleives he has been sober for 15 years. He is a heavy smoker, and does not follow through with medical plans per Ms. Lacinda Axon. She herself is not in a position to know what his advance care plan wishes are and would defer to children or health care providers.   Contacts/Participants in Discussion: Primary Decision Maker: Pt can make simple decisions but question his ability at this point to address code status, GOC  HCPOA: no  Pt has a son that lives in [place] ( per Ms. Lacinda Axon his  neighbor ) and a daughter. She does not know their numbers. Pt also is able to tell me he has a son and daughter . States his son lives in [place] but he does not know how to reach either one. He does give me permission to speak with them about his health condition if we are able to locate them  Code Status/Advance Care Planning:  Cont Full Code in abscense of other information   Obtain psychiatric consult to evaluate capacity  Symptom Management:   Pain: Pt is in pain with turning or repositioning. He is still NPO so he has not received Vicodin as ordered. Will leave low dose ms04 2 mg q3 prn for pain until, his ability to swallow can be evaluated.  Palliative Prophylaxis: Bowel regimin  Additional Recommendations (Limitations, Scope, Preferences):  SNF after DC. Pt not appropriate for hospice in-patient  Unless he has a sharp clinical decline Psycho-social/Spiritual:   Support System: very limited. Mostly his neighbors in apartment complex  Desire for further Chaplaincy support:no  Prognosis: Unable to determine  Discharge Planning:  [place] for rehab with Palliative care service follow-up . Pt has refused SNF in the recent past but with CVA may recognize he needs help       Chief Complaint/History of Present Illness: Pt is a 81 yo man admitted with new left-sided weakness and fouund to have CVA. He was just dc'd from [facility] the  day before after being treated for PNA  Primary Diagnoses  Present on Admission:  . TOBACCO ABUSE . Protein-calorie malnutrition, severe . Lung cancer . GASTROESOPHAGEAL REFLUX DISEASE . FTT (failure to thrive) in adult . Falls . Essential hypertension . Acute thromboembolism of deep veins of lower extremity . CVA (cerebral infarction) . CAP (community acquired pneumonia) . Complex partial seizure . Shortness of breath  Palliative Review of Systems: He is reporting back pain, and pain to his left side, worse with movement.  Denies dyspnea I have reviewed the medical record, interviewed the patient and family, and examined the patient. The following aspects are pertinent.  Past Medical History  Diagnosis Date  . Hypertension   . COPD (chronic obstructive pulmonary disease)   . Hemorrhoids     external  . 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 GERD (gastroesophageal reflux disease)   . Emphysema   . HOH (hard of hearing)   . Pneumonia   . Seizures   . Headache(784.0)   . Non-small cell carcinoma of lung dx'd 2006    stage 3  . Lung cancer   . Stroke    Social History   Social History  . Marital Status: Divorced    Spouse Name: N/A  . Number of Children: 2  . Years of Education: 12   Social History Main Topics  . Smoking status: Current Every Day Smoker -- 1.50 packs/day for 60 years    Types: Cigarettes  . Smokeless tobacco: Never Used  . Alcohol Use: No  . Drug Use: No  . Sexual Activity: No   Other Topics Concern  . None   Social History Narrative   Lives alone    Drinks a lot of caffeine per the pt (5 or greater a day)   Right handed   Family History  Problem Relation Age of Onset  . Heart failure Brother    Scheduled Meds: . amoxicillin-clavulanate  1 tablet Oral Q12H  . aspirin  300 mg Rectal Daily   Or  . aspirin  325 mg Oral Daily  . dextromethorphan-guaiFENesin  1 tablet Oral BID  . feeding supplement (ENSURE ENLIVE)  237 mL Oral TID BM  . fosPHENYtoin (CEREBYX) IV  400 mg PE Intravenous Once  . gabapentin  300 mg Oral QPM  . heparin  5,000 Units Subcutaneous 3 times per day  . mometasone-formoterol  2 puff Inhalation BID  . nicotine  21 mg Transdermal Daily  . potassium chloride  10 mEq Intravenous Q1 Hr x 4  . predniSONE  20 mg Oral Q breakfast  . sodium chloride  1,500 mL Intravenous Once  . sodium chloride  3 mL Intravenous Q12H   Continuous Infusions: . 0.9 % NaCl with KCl 20 mEq / L 75 mL/hr at 10/15/14 1037   PRN Meds:.acetaminophen **OR**  acetaminophen, albuterol, hydrALAZINE, HYDROcodone-acetaminophen, ipratropium-albuterol, morphine injection Medications Prior to Admission:  Prior to Admission medications   Medication Sig Start Date End Date Taking? Authorizing Provider  amoxicillin-clavulanate (AUGMENTIN) 875-125 MG per tablet Take 1 tablet by mouth every 12 (twelve) hours. 10/13/14 11/02/14  Annita Brod, MD  feeding supplement, ENSURE ENLIVE, (ENSURE ENLIVE) LIQD Take 237 mLs by mouth 3 (three) times daily between meals. 10/13/14   Annita Brod, MD  Fluticasone-Salmeterol (ADVAIR) 250-50 MCG/DOSE AEPB Inhale 1 puff into the lungs 2 (two) times daily.    Historical Provider, MD  gabapentin (NEURONTIN) 300 MG capsule Take 300 mg by  mouth every evening.    Historical Provider, MD  ipratropium-albuterol (DUONEB) 0.5-2.5 (3) MG/3ML SOLN Inhale 3 mL by nebulization every six (6) hours as needed. For shortness of breath/wheezing 07/06/14 07/06/15  Historical Provider, MD  nicotine (NICODERM CQ - DOSED IN MG/24 HOURS) 21 mg/24hr patch Place 1 patch (21 mg total) onto the skin daily. 10/13/14   Annita Brod, MD  phenytoin (DILANTIN) 100 MG ER capsule Take 300 mg by mouth daily.    Historical Provider, MD  predniSONE (DELTASONE) 10 MG tablet 20 mg daily x 2 days, then 10 mg daily x 3 days, then stop. 10/13/14   Annita Brod, MD  PROAIR HFA 108 (90 BASE) MCG/ACT inhaler INHALE 2 PUFFS EVERY 4-6 HOURS AS NEEDED FOR COUGH OR WHEEZING 08/29/14   Historical Provider, MD   No Known Allergies CBC:    Component Value Date/Time   WBC 6.6 10/15/2014 0550   WBC 5.6 12/25/2012 0832   HGB 13.5 10/15/2014 0550   HGB 15.2 12/25/2012 0832   HCT 41.5 10/15/2014 0550   HCT 45.8 12/25/2012 0832   PLT 398 10/15/2014 0550   PLT 212 12/25/2012 0832   MCV 81.7 10/15/2014 0550   MCV 90.1 12/25/2012 0832   NEUTROABS 11.0* 10/13/2014 2000   NEUTROABS 3.7 12/25/2012 0832   LYMPHSABS 0.5* 10/13/2014 2000   LYMPHSABS 0.9 12/25/2012 0832    MONOABS 1.0 10/13/2014 2000   MONOABS 0.7 12/25/2012 0832   EOSABS 0.0 10/13/2014 2000   EOSABS 0.3 12/25/2012 0832   BASOSABS 0.0 10/13/2014 2000   BASOSABS 0.0 12/25/2012 0832   Comprehensive Metabolic Panel:    Component Value Date/Time   NA 130* 10/15/2014 0550   NA 140 12/25/2012 0832   NA 142 02/15/2011 0956   K 3.2* 10/15/2014 0550   K 4.7 12/25/2012 0832   K 4.4 02/15/2011 0956   CL 91* 10/15/2014 0550   CL 100 04/16/2012 0832   CL 99 02/15/2011 0956   CO2 27 10/15/2014 0550   CO2 27 12/25/2012 0832   CO2 29 02/15/2011 0956   BUN 8 10/15/2014 0550   BUN 7.5 12/25/2012 0832   BUN 8 02/15/2011 0956   CREATININE 0.73 10/15/2014 0550   CREATININE 0.8 12/25/2012 0832   CREATININE 0.5* 02/15/2011 0956   GLUCOSE 75 10/15/2014 0550   GLUCOSE 123 12/25/2012 0832   GLUCOSE 113* 04/16/2012 0832   GLUCOSE 137* 02/15/2011 0956   CALCIUM 8.5* 10/15/2014 0550   CALCIUM 9.3 12/25/2012 0832   CALCIUM 8.8 02/15/2011 0956   AST 23 10/13/2014 2000   AST 14 12/25/2012 0832   AST 18 02/15/2011 0956   ALT 16* 10/13/2014 2000   ALT 13 12/25/2012 0832   ALT 19 02/15/2011 0956   ALKPHOS 97 10/13/2014 2000   ALKPHOS 118 12/25/2012 0832   ALKPHOS 109* 02/15/2011 0956   BILITOT 0.5 10/13/2014 2000   BILITOT 0.32 12/25/2012 0832   BILITOT 0.50 02/15/2011 0956   PROT 5.7* 10/13/2014 2000   PROT 6.9 12/25/2012 0832   PROT 7.4 02/15/2011 0956   ALBUMIN 2.0* 10/13/2014 2000   ALBUMIN 3.6 12/25/2012 0832    Physical Exam: Vital Signs: BP 117/56 mmHg  Pulse 70  Temp(Src) 98.8 F (37.1 C) (Oral)  Resp 17  Wt 50.531 kg (111 lb 6.4 oz)  SpO2 96% SpO2: SpO2: 96 % O2 Device: O2 Device: Not Delivered O2 Flow Rate: O2 Flow Rate (L/min): 2 L/min Intake/output summary: No intake or output data in the 24 hours ending  10/15/14 1113 LBM:   Baseline Weight: Weight: 52.617 kg (116 lb) Most recent weight: Weight:  (pt out for test/procedure)  Exam Findings:  General: Cachetic elderly man.  Alert, able to tell me he had a stroke Resp: Clear. No work of breathing Neuro: Can move his LUE only slightly,as well as his left leg. Speech dysarthric Psych: Alert, oriented to self, that he had a CVA. Able to tell me some aspects of his life such as he has children, where he lives.          Palliative Performance Scale: 30-40              Additional Data Reviewed: Recent Labs     10/14/14  0329  10/15/14  0550  WBC  9.9  6.6  HGB  12.5*  13.5  PLT  354  398  NA  131*  130*  BUN  6  8  CREATININE  0.60*  0.73     Time In: 0800 Time Out: 0930 Time Total: 90 min Greater than 50%  of this time was spent counseling and coordinating care related to the above assessment and plan. Staffed with Dr. [place] Pink. Will cont to follow with pt and engage in [place] conversations as well as finding apartment complex number in effort to locate children in event pt lacks capacity to make health care decisions. Psychiatric consult placed to eval capacity  Signed by: Dory Horn, NP  Dory Horn, NP  10/15/2014, 11:13 AM  Please contact Palliative Medicine Team phone at [phone] for questions and concerns.   Spoke to [employer]. Pt resides in [employer] in [place]. Manager may have an emergency contact number for Mr. Lycan but she cannot be reached until Mon morning after 0800. Her name is Rhae Lerner, number [phone]

## 2014-10-15 NOTE — Progress Notes (Signed)
Speech paged r/t swallow eval. Per Speech will be here within an hour.MD at bedside at this time.   Ave Filter, RN

## 2014-10-15 NOTE — Evaluation (Signed)
Physical Therapy Evaluation Patient Details Name: Paul [name] MRN: [id number] DOB: 22-Dec-1930 Today's Date: 10/15/2014   History of Present Illness  [name] is a 81 y.o. 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, complex partial seizures on dilantin, GERD, tobacco abuse, COPD, diastolic congestive heart failure, recent hospitalization at [facility] due to pneumonia, was admitted after found on the floor at home with inability to move the left arm.Found to have Right brain infarcts largely in a watershed type distribution at [employer] and MCA/PCA territory  Clinical Impression  Pt admitted with the above diagnosis. Pt currently with functional limitations due to the deficits listed below (see PT Problem List). Somewhat agitated but willing to get out of bed and work briefly with PT. Required max assist for transfer due to Lt sided weakness and poor response for following commands. Highly recommend higher level of care at discharge due to poor safety awareness and inability to safely mobilize or care for self. Pt will benefit from skilled PT to increase their independence and safety with mobility to allow discharge to the venue listed below.       Follow Up Recommendations SNF;Supervision/Assistance - 24 hour (Pending palliative consult and recommendations)    Equipment Recommendations  None recommended by PT    Recommendations for Other Services       Precautions / Restrictions Precautions Precautions: Fall Restrictions Weight Bearing Restrictions: No      Mobility  Bed Mobility Overal bed mobility: Needs Assistance Bed Mobility: Supine to Sit     Supine to sit: Max assist;HOB elevated     General bed mobility comments: Difficulty following commands. Able to bring LEs off bed with assist, but trequired max assistance for truncal support to rise.  Transfers Overall transfer level: Needs assistance Equipment used: Rolling walker (2 wheeled) Transfers:  Sit to/from [facility] Sit to Stand: Max assist Stand pivot transfers: Max assist       General transfer comment: Max assist for boost to rise, balance due to left lean, and stability with pivot to chair. Remains very flexed at trunk, needs assist to grip RW on Lt. Poor response to commands. Was able to advance LLE with max cues but poor tolerance to weight bearing. Attempts to sit prematurely requiring max assist for balance and sequencing. Refuses to practice additional standing or transfers.  Ambulation/Gait                Stairs            Wheelchair Mobility    Modified Rankin (Stroke Patients Only) Modified Rankin (Stroke Patients Only) Pre-Morbid Rankin Score:  (Unable to state) Modified Rankin: Severe disability     Balance Overall balance assessment: Needs assistance Sitting-balance support: Feet supported;No upper extremity supported Sitting balance-Leahy Scale: Fair   Postural control: Left lateral lean Standing balance support: Bilateral upper extremity supported Standing balance-Leahy Scale: Zero                               Pertinent Vitals/Pain Pain Assessment: No/denies pain (grimaces with movement of LUE)    Home Living Family/patient expects to be discharged to:: Unsure Living Arrangements: Alone Available Help at Discharge:  (Unsure) Type of Home: Apartment Home Access: Level entry       Home Equipment: Walker - 2 wheels Additional Comments: Patient is a poor historian, Some information pulled from previous admission    Prior  Function Level of Independence: Independent with assistive device(s)         Comments: Reports he uses a walker at baseline     Hand Dominance   Dominant Hand:  (Does not answer)    Extremity/Trunk Assessment   Upper Extremity Assessment: Defer to OT evaluation           Lower Extremity Assessment: LLE deficits/detail;Difficult to assess due to impaired cognition    LLE Deficits / Details: Able to lift against gravity, poor cooperation with testing. Difficulty standing on LLE     Communication   Communication: HOH  Cognition Arousal/Alertness: Awake/alert Behavior During Therapy: Agitated Overall Cognitive Status: No family/caregiver present to determine baseline cognitive functioning Area of Impairment: Orientation;Safety/judgement;Following commands;Problem solving Orientation Level: Disoriented to;Time;Situation   Memory: Decreased recall of precautions Following Commands: Follows one step commands inconsistently Safety/Judgement: Decreased awareness of safety;Decreased awareness of deficits   Problem Solving: Slow processing;Decreased initiation;Difficulty sequencing;Requires verbal cues;Requires tactile cues General Comments: "I don't feel good"    General Comments General comments (skin integrity, edema, etc.): Anisocoria    Exercises        Assessment/Plan    PT Assessment Patient needs continued PT services  PT Diagnosis Difficulty walking;Altered mental status (Hemiplegia, does not report dominant upper extremity)   PT Problem List Decreased strength;Decreased activity tolerance;Decreased mobility;Decreased cognition;Decreased knowledge of use of DME;Decreased safety awareness;Decreased knowledge of precautions;Decreased range of motion;Decreased balance;Decreased coordination  PT Treatment Interventions DME instruction;Gait training;Functional mobility training;Therapeutic activities;Therapeutic exercise;Patient/family education;Balance training;Neuromuscular re-education;Cognitive remediation   PT Goals (Current goals can be found in the Care Plan section) Acute Rehab PT Goals Patient Stated Goal: none stated PT Goal Formulation: With patient Time For Goal Achievement: 10/29/14 Potential to Achieve Goals: Fair    Frequency Min 3X/week   Barriers to discharge Decreased caregiver support lives alone    Co-evaluation                End of Session Equipment Utilized During Treatment: Gait belt Activity Tolerance: Patient limited by fatigue;Treatment limited secondary to agitation Patient left: in chair;with call bell/phone within reach;with chair alarm set Nurse Communication: Mobility status         Time: 1431-1449 PT Time Calculation (min) (ACUTE ONLY): 18 min   Charges:   PT Evaluation $Initial PT Evaluation Tier I: 1 Procedure     PT G CodesEllouise Newer 10/15/2014, 3:36 PM  Camille Bal [place], [place]

## 2014-10-16 DIAGNOSIS — W19XXXA Unspecified fall, initial encounter: Secondary | ICD-10-CM

## 2014-10-16 DIAGNOSIS — R55 Syncope and collapse: Secondary | ICD-10-CM

## 2014-10-16 LAB — BASIC METABOLIC PANEL
ANION GAP: 9 (ref 5–15)
BUN: 9 mg/dL (ref 6–20)
CALCIUM: 8.1 mg/dL — AB (ref 8.9–10.3)
CHLORIDE: 101 mmol/L (ref 101–111)
CO2: 22 mmol/L (ref 22–32)
CREATININE: 0.52 mg/dL — AB (ref 0.61–1.24)
GFR calc Af Amer: >60 mL/min (ref >60)
GFR calc non Af Amer: >60 mL/min (ref >60)
GLUCOSE: 92 mg/dL (ref 65–99)
Potassium: 5 mmol/L (ref 3.5–5.1)
Sodium: 132 mmol/L — ABNORMAL LOW (ref 135–145)

## 2014-10-16 LAB — PHENYTOIN LEVEL, TOTAL: Phenytoin Lvl: 9.5 ug/mL — ABNORMAL LOW (ref 10.0–20.0)

## 2014-10-16 LAB — GLUCOSE, CAPILLARY: GLUCOSE-CAPILLARY: 104 mg/dL — AB (ref 65–99)

## 2014-10-16 MED ORDER — FOSPHENYTOIN SODIUM 500 MG PE/10ML IJ SOLN
10.0000 mg/kg | Freq: Once | INTRAMUSCULAR | Status: AC
  Administered 2014-10-16: 481 mg via INTRAVENOUS
  Filled 2014-10-16: qty 9.62

## 2014-10-16 MED ORDER — RESOURCE THICKENUP CLEAR PO POWD
ORAL | Status: DC | PRN
  Filled 2014-10-16: qty 125

## 2014-10-16 MED ORDER — ASPIRIN EC 81 MG PO TBEC
81.0000 mg | DELAYED_RELEASE_TABLET | Freq: Every day | ORAL | Status: DC
  Administered 2014-10-17 – 2014-10-18 (×2): 81 mg via ORAL
  Filled 2014-10-16 (×2): qty 1

## 2014-10-16 NOTE — Progress Notes (Signed)
Speech Language Pathology Treatment: Dysphagia  Patient Details Name: Paul [name] MRN: [id number] DOB: May 11, 1930 Today's Date: 10/16/2014 Time: 8811-0315 SLP Time Calculation (min) (ACUTE ONLY): 13 min  Assessment / Plan / Recommendation Clinical Impression  ST follow up for therapeutic diet tolerance.  Per staff the patient has been getting strangled on liquids.  Chart review indicated that the patient is currently afebrile, lungs are clear except diminished and intake has been poor.  Meal observation was performed and the patient presented with a gurgly vocal quality s/p thin liquids via tsp sips.  Delayed oral transit and inconsistently delayed swallow trigger were noted.  Given dysphagia 1 diet and nectar thick liquids overt s/s of aspiration were not observed.  Attempted soft chewables which the patient refused.  Nursing reported no obvious issues with food.  Recommend dysphagia 2 diet with nectar thick liquids.  ST will continue to follow.     HPI Other Pertinent Information: 81 y.o. male with PMH of hypertension, GERD, tobacco abuse, COPD, complex partial seizure, non-small cell lung cancer, stroke, diastolic congestive heart failure, who presents with fall and possible syncope. recently hospitalized from 9/19-9/22 and was treated for pneumonia. He was found to have multilobar pneumonia with CT clarifying cavitary lesion pneumonia and increasing right upper nodule size concerning for lung cancer recurrence. MRI brain findings consistent with a right MCA territory infarct.  Pt has been confused, easily irritated and refusing POs.  During recent admission to [facility], pt had swallow evaluated on 9/21 with results concerning for baseline aspiration risk secondary to his COPD, ? impact of prior CVA and deconditioning. Coughing noted at baseline due to retained secretions but this also occurred consistently with intake (after approximately 80% of boluses).    Pertinent Vitals Pain Assessment:  Faces Faces Pain Scale: Hurts even more Pain Location: head, left hip, left arm, right toe(s) Pain Descriptors / Indicators: Grimacing (verbalizing pain) Pain Intervention(s): Monitored during session;Limited activity within patient's tolerance  SLP Plan  Continue with current plan of care    Recommendations Diet recommendations: Dysphagia 2 (fine chop);Nectar-thick liquid Liquids provided via: Cup Medication Administration: Whole meds with puree Supervision: Full supervision/cueing for compensatory strategies Compensations: Slow rate;Follow solids with liquid;Small sips/bites Postural Changes and/or Swallow Maneuvers: Seated upright 90 degrees;Upright 30-60 min after meal             Plan: Continue with current plan of care    [place], MA, [place] [facility] SLP [phone]  Lamar Sprinkles 10/16/2014, 1:27 PM

## 2014-10-16 NOTE — Progress Notes (Signed)
[facility] PROGRESS NOTE  Paul [name] JQZ:[id number] DOB: Sep 05, 1930 DOA: 10/13/2014  PCP: Junie Panning, NP  Brief HPI: 81 year old Caucasian male with a past medical history of hypertension, GERD, tobacco abuse, COPD, history of complex partial seizures, non-small cell lung cancer, previous history of stroke, presented with fall and possible syncope. On examination, he was found to have left-sided weakness. MRI showed a right-sided stroke. Of note, patient was discharged from [facility] on September 22 after being managed for pneumonia.  Past medical history:  Past Medical History  Diagnosis Date  . Hypertension   . COPD (chronic obstructive pulmonary disease)   . Hemorrhoids     external  . 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 GERD (gastroesophageal reflux disease)   . Emphysema   . HOH (hard of hearing)   . Pneumonia   . Seizures   . Headache(784.0)   . Non-small cell carcinoma of lung dx'd 2006    stage 3  . Lung cancer   . Stroke     Consultants: Neurology  Procedures:  2-D echocardiogram Study Conclusions - Left ventricle: The cavity size was normal. Systolic function wasnormal. The estimated ejection fraction was in the range of 55%to 60%. Wall motion was normal; there were no regional wallmotion abnormalities. Doppler parameters are consistent withabnormal left ventricular relaxation (grade 1 diastolicdysfunction). There was no evidence of elevated ventricularfilling pressure by Doppler parameters. - Aortic valve: Trileaflet; normal thickness leaflets. There was noregurgitation. - Aortic root: The aortic root was normal in size. - Mitral valve: There was mild regurgitation. - Left atrium: The atrium was normal in size. - Right ventricle: Systolic function was normal. - Right atrium: The atrium was normal in size. - Tricuspid valve: There was no regurgitation. - Pulmonary arteries: Systolic pressure was within the  normalrange. - Inferior vena cava: The vessel was normal in size. - Pericardium, extracardiac: There was no pericardial effusion. Impressions: - This is a limited echocardiogram with only subcostal images.There appears to be normal chamber size, normal biventricularsystolic function.There is abnormal relaxation with normal filling pressures.Mild mitral regurgitation.Normal RVSP.  Antibiotics: Augmentin  Subjective: Patient offers no complaints this morning. Strength in the left side is about the same as yesterday.   Objective: Vital Signs  Filed Vitals:   10/15/14 2210 10/16/14 0129 10/16/14 0500 10/16/14 0501  BP: 128/62 130/64  115/64  Pulse: 70 68  70  Temp: 97.8 F (36.6 C) 98 F (36.7 C)  97.8 F (36.6 C)  TempSrc: Oral Oral  Oral  Resp: '17 18  18  '$ Weight:   48.081 kg (106 lb)   SpO2: 98% 98%  98%    Intake/Output Summary (Last 24 hours) at 10/16/14 0949 Last data filed at 10/16/14 0830  Gross per 24 hour  Intake 1541.75 ml  Output    200 ml  Net 1341.75 ml   Filed Weights   10/13/14 1823 10/14/14 0100 10/16/14 0500  Weight: 52.617 kg (116 lb) 50.531 kg (111 lb 6.4 oz) 48.081 kg (106 lb)    General appearance: alert, cooperative, appears stated age and no distress Resp: clear to auscultation bilaterally Cardio: regular rate and rhythm, S1, S2 normal, no murmur, click, rub or gallop GI: soft, non-tender; bowel sounds normal; no masses,  no organomegaly Extremities: His left forearm is covered in a dressing. There is a skin tear. This is noted to be tender. Neurologic: Improved mobility in the left arm and leg. Slurred speech.  Improved mentation compared to yesterday, but still distracted.  Lab Results:  Basic Metabolic Panel:  Recent Labs Lab 10/12/14 0515 10/13/14 2000 10/14/14 0329 10/15/14 0550 10/15/14 0941 10/16/14 0613  NA 130* 134* 131* 130*  --  132*  K 4.0 4.2 4.1 3.2*  --  5.0  CL 96* 100* 94* 91*  --  101  CO2 '26 28 29 27  '$ --  22    GLUCOSE 106* 123* 112* 75  --  92  BUN '10 9 6 8  '$ --  9  CREATININE 0.40* 0.49* 0.60* 0.73  --  0.52*  CALCIUM 8.4* 8.5* 8.6* 8.5*  --  8.1*  MG  --   --   --   --  2.0  --    Liver Function Tests:  Recent Labs Lab 10/13/14 2000  AST 23  ALT 16*  ALKPHOS 97  BILITOT 0.5  PROT 5.7*  ALBUMIN 2.0*   CBC:  Recent Labs Lab 10/11/14 0525 10/12/14 0515 10/13/14 2000 10/14/14 0329 10/15/14 0550  WBC 9.0 7.9 12.6* 9.9 6.6  NEUTROABS  --   --  11.0*  --   --   HGB 11.4* 11.2* 11.4* 12.5* 13.5  HCT 35.7* 34.4* 35.2* 39.6 41.5  MCV 83.0 81.9 80.9 82.5 81.7  PLT 379 354 346 354 398   Cardiac Enzymes:  Recent Labs Lab 10/10/14 0953  TROPONINI <0.03   BNP (last 3 results)  Recent Labs  10/14/14 0329  BNP 317.4*   CBG:  Recent Labs Lab 10/14/14 0639 10/15/14 0752 10/16/14 0725  GLUCAP 132* 89 104*    Recent Results (from the past 240 hour(s))  Culture, blood (routine x 2)     Status: None   Collection Time: 10/10/14  1:14 PM  Result Value Ref Range Status   Specimen Description BLOOD LEFT ANTECUBITAL  Final   Special Requests BOTTLES DRAWN AEROBIC AND ANAEROBIC 5.5CC  Final   Culture   Final    NO GROWTH 5 DAYS Performed at [facility]    Report Status 10/15/2014 FINAL  Final  Culture, blood (x 2)     Status: None (Preliminary result)   Collection Time: 10/13/14 11:24 PM  Result Value Ref Range Status   Specimen Description BLOOD LEFT HAND  Final   Special Requests BOTTLES DRAWN AEROBIC AND ANAEROBIC 5CC  Final   Culture NO GROWTH 1 DAY  Final   Report Status PENDING  Incomplete  Culture, blood (x 2)     Status: None (Preliminary result)   Collection Time: 10/13/14 11:43 PM  Result Value Ref Range Status   Specimen Description BLOOD RIGHT ARM  Final   Special Requests BOTTLES DRAWN AEROBIC AND ANAEROBIC 5CC  Final   Culture NO GROWTH 1 DAY  Final   Report Status PENDING  Incomplete      Studies/Results: Ct Angio Head W/cm &/or Wo  Cm  10/15/2014   ADDENDUM REPORT: 10/15/2014 09:51 ADDENDUM: Study discussed by telephone with Dr. Rosalin Hawking on 10/15/2014 at 0950 hours. There should also be an additional Impression regarding the left carotid system which reads: - High-grade stenosis left ICA siphon, and distal left ICA bulb stenosis 65%. Electronically Signed   By: Genevie Ann M.D.   On: 10/15/2014 09:51  10/15/2014   CLINICAL DATA:  81 year old male with frontal headache. Syncope. Left arm weakness. Patchy acute right MCA territory infarcts diagnosed on recent brain MRI. Initial encounter.  EXAM: CT ANGIOGRAPHY HEAD AND NECK  TECHNIQUE: Multidetector CT  imaging of the head and neck was performed using the standard protocol during bolus administration of intravenous contrast. Multiplanar CT image reconstructions and MIPs were obtained to evaluate the vascular anatomy. Carotid stenosis measurements (when applicable) are obtained utilizing NASCET criteria, using the distal internal carotid diameter as the denominator.  CONTRAST:  70m OMNIPAQUE IOHEXOL 350 MG/ML SOLN  COMPARISON:  Brain MRI without contrast 10/14/2014, and earlier. Chest CT 10/14/2014.  FINDINGS: CT HEAD  Brain: Mild cortical and white matter hypodensity corresponding to the patchy superior right hemisphere infarcts seen on 10/14/2014 (series 6, image 27). No hemorrhagic transformation or mass effect. Right occipital pole hypodensity also appears increased (image 16). Elsewhere stable gray-white matter differentiation.  No acute intracranial hemorrhage identified. No ventriculomegaly. No intracranial mass effect.  Calvarium and skull base:  No acute osseous abnormality identified.  Paranasal sinuses: Right maxillary sinus fluid level and bubbly opacity. Right mastoid effusion.  Orbits: Visualized orbits and scalp soft tissues are within normal limits.  CTA NECK  Skeleton: Advanced degenerative changes in the left cervical spine. T5 compression fracture is stable from the recent chest  CT. Osteopenia. Chronic right clavicle fracture. No suspicious osseous lesion in the neck.  Other neck: Increased layering secretions in the distal trachea and mainstem bronchi (series 8, image 1). Stable abnormal lung apices.  Thyroid, larynx, pharynx, parapharyngeal spaces, retropharyngeal space, sublingual space, submandibular glands, and parotid glands are within normal limits for age. No cervical lymphadenopathy.  Aortic arch: 3 vessel arch configuration with moderate soft and calcified plaque affecting the arch.  Right carotid system: Radiographic string sign stenosis right CCA 2 cm beyond its origin (series 8, image 34 and series 10, image 125) with confluent surrounding soft plaque or thrombus. Despite this the right CCA is patent. Soft and calcified plaque at the right carotid bifurcation results in up to 50 % stenosis with respect to the distal vessel. Otherwise negative cervical right ICA.  Left carotid system: Medial soft plaque in the proximal left CCA resulting in less than 50 % stenosis with respect to the distal vessel (series 8, image 24). Bulky calcified plaque at the left carotid bifurcation. This continues into the left ICA bulb. Stenosis at the distal bulb level up to 65 % stenosis with respect to the distal vessel (series 10, image 100). Otherwise negative cervical left ICA.  Vertebral arteries:  Tortuous proximal right subclavian artery with a kinked appearance plus superimposed soft and calcified plaque. Subsequent up to 50 % stenosis with respect to the distal vessel. Right vertebral artery origin is occluded (series 8, image 34). Right V1 segment is occluded. Reconstituted flow in the V2 segment at the C3-C4 level from muscular collaterals. However, the distal right vertebral artery tapers to the skullbase but does remain patent.  Soft and calcified plaque in the proximal left subclavian artery. Ulcerated plaque on series 8, image 34 just proximal to the left vertebral artery origin. Less  than 50% stenosis of the proximal left subclavian. Soft and calcified plaque at the left vertebral artery origin but only mild subsequent stenosis. Tortuous left V1 segment. No left V2 or V3 stenosis.  CTA HEAD  Posterior circulation: Diminutive distal right vertebral artery is patent to the vertebrobasilar junction. Dominant right AICA. No distal left vertebral artery stenosis, in that vessel primarily supplies the basilar. Patent left AICA origin. No basilar artery stenosis. Normal SCA and left PCA origins. Fetal type right PCA origin. Left posterior communicating artery is diminutive. Bilateral PCA branches are patent and mildly irregular.  Anterior circulation: Severe calcified plaque of both ICA siphons resulting in high-grade vertical peach wrists segment stenosis worse on the right. Despite this both ICA siphons remain patent. Tandem high-grade stenosis at the right anterior genu from additional calcified plaque. Both carotid termini are patent. Ophthalmic and posterior communicating artery origins are normal.  Normal MCA and ACA origins. Diminutive anterior communicating artery. Bilateral ACA branches are within normal limits. Left MCA M1 segment, bifurcation, and branches are within normal limits.  Right MCA origin and M1 segment are patent. Right MCA bifurcation is patent. No right MCA branch occlusion identified.  Venous sinuses: Patent.  Anatomic variants: None.  Delayed phase: No abnormal enhancement identified.  IMPRESSION: 1. Bulky soft plaque or thrombus in the proximal right CCA with RADIOGRAPHIC STRING SIGN stenosis, 2 cm from the arch. Additional plaque at the right carotid bifurcation is not hemodynamically significant. However, there are tandem high-grade stenoses also in the right ICA siphon related to bulky calcified plaque. 2. No right MCA stenosis or branch occlusion identified. 3. Occluded right vertebral artery origin with intermediate reconstitution in the distal V2 segment, but  diminutive intracranial right vertebral artery. Left vertebral artery primarily supplies the basilar with no significant stenosis. 4. Fairly extensive soft atherosclerotic plaque. Multiple areas of ulcerated plaque at the arch and proximal great vessels. 5. Expected evolution of patchy right MCA infarcts with no hemorrhagic transformation or intracranial mass effect. 6. Stable abnormal visible right lung. Interval increased retained secretions in the distal trachea and mainstem bronchi.  Electronically Signed: By: Genevie Ann M.D. On: 10/15/2014 09:36   Ct Angio Neck W/cm &/or Wo/cm  10/15/2014   ADDENDUM REPORT: 10/15/2014 09:51 ADDENDUM: Study discussed by telephone with Dr. Rosalin Hawking on 10/15/2014 at 0950 hours. There should also be an additional Impression regarding the left carotid system which reads: - High-grade stenosis left ICA siphon, and distal left ICA bulb stenosis 65%. Electronically Signed   By: Genevie Ann M.D.   On: 10/15/2014 09:51  10/15/2014   CLINICAL DATA:  81 year old male with frontal headache. Syncope. Left arm weakness. Patchy acute right MCA territory infarcts diagnosed on recent brain MRI. Initial encounter.  EXAM: CT ANGIOGRAPHY HEAD AND NECK  TECHNIQUE: Multidetector CT imaging of the head and neck was performed using the standard protocol during bolus administration of intravenous contrast. Multiplanar CT image reconstructions and MIPs were obtained to evaluate the vascular anatomy. Carotid stenosis measurements (when applicable) are obtained utilizing NASCET criteria, using the distal internal carotid diameter as the denominator.  CONTRAST:  33m OMNIPAQUE IOHEXOL 350 MG/ML SOLN  COMPARISON:  Brain MRI without contrast 10/14/2014, and earlier. Chest CT 10/14/2014.  FINDINGS: CT HEAD  Brain: Mild cortical and white matter hypodensity corresponding to the patchy superior right hemisphere infarcts seen on 10/14/2014 (series 6, image 27). No hemorrhagic transformation or mass effect. Right  occipital pole hypodensity also appears increased (image 16). Elsewhere stable gray-white matter differentiation.  No acute intracranial hemorrhage identified. No ventriculomegaly. No intracranial mass effect.  Calvarium and skull base:  No acute osseous abnormality identified.  Paranasal sinuses: Right maxillary sinus fluid level and bubbly opacity. Right mastoid effusion.  Orbits: Visualized orbits and scalp soft tissues are within normal limits.  CTA NECK  Skeleton: Advanced degenerative changes in the left cervical spine. T5 compression fracture is stable from the recent chest CT. Osteopenia. Chronic right clavicle fracture. No suspicious osseous lesion in the neck.  Other neck: Increased layering secretions in the distal trachea and mainstem bronchi (series 8,  image 1). Stable abnormal lung apices.  Thyroid, larynx, pharynx, parapharyngeal spaces, retropharyngeal space, sublingual space, submandibular glands, and parotid glands are within normal limits for age. No cervical lymphadenopathy.  Aortic arch: 3 vessel arch configuration with moderate soft and calcified plaque affecting the arch.  Right carotid system: Radiographic string sign stenosis right CCA 2 cm beyond its origin (series 8, image 34 and series 10, image 125) with confluent surrounding soft plaque or thrombus. Despite this the right CCA is patent. Soft and calcified plaque at the right carotid bifurcation results in up to 50 % stenosis with respect to the distal vessel. Otherwise negative cervical right ICA.  Left carotid system: Medial soft plaque in the proximal left CCA resulting in less than 50 % stenosis with respect to the distal vessel (series 8, image 24). Bulky calcified plaque at the left carotid bifurcation. This continues into the left ICA bulb. Stenosis at the distal bulb level up to 65 % stenosis with respect to the distal vessel (series 10, image 100). Otherwise negative cervical left ICA.  Vertebral arteries:  Tortuous proximal  right subclavian artery with a kinked appearance plus superimposed soft and calcified plaque. Subsequent up to 50 % stenosis with respect to the distal vessel. Right vertebral artery origin is occluded (series 8, image 34). Right V1 segment is occluded. Reconstituted flow in the V2 segment at the C3-C4 level from muscular collaterals. However, the distal right vertebral artery tapers to the skullbase but does remain patent.  Soft and calcified plaque in the proximal left subclavian artery. Ulcerated plaque on series 8, image 34 just proximal to the left vertebral artery origin. Less than 50% stenosis of the proximal left subclavian. Soft and calcified plaque at the left vertebral artery origin but only mild subsequent stenosis. Tortuous left V1 segment. No left V2 or V3 stenosis.  CTA HEAD  Posterior circulation: Diminutive distal right vertebral artery is patent to the vertebrobasilar junction. Dominant right AICA. No distal left vertebral artery stenosis, in that vessel primarily supplies the basilar. Patent left AICA origin. No basilar artery stenosis. Normal SCA and left PCA origins. Fetal type right PCA origin. Left posterior communicating artery is diminutive. Bilateral PCA branches are patent and mildly irregular.  Anterior circulation: Severe calcified plaque of both ICA siphons resulting in high-grade vertical peach wrists segment stenosis worse on the right. Despite this both ICA siphons remain patent. Tandem high-grade stenosis at the right anterior genu from additional calcified plaque. Both carotid termini are patent. Ophthalmic and posterior communicating artery origins are normal.  Normal MCA and ACA origins. Diminutive anterior communicating artery. Bilateral ACA branches are within normal limits. Left MCA M1 segment, bifurcation, and branches are within normal limits.  Right MCA origin and M1 segment are patent. Right MCA bifurcation is patent. No right MCA branch occlusion identified.  Venous  sinuses: Patent.  Anatomic variants: None.  Delayed phase: No abnormal enhancement identified.  IMPRESSION: 1. Bulky soft plaque or thrombus in the proximal right CCA with RADIOGRAPHIC STRING SIGN stenosis, 2 cm from the arch. Additional plaque at the right carotid bifurcation is not hemodynamically significant. However, there are tandem high-grade stenoses also in the right ICA siphon related to bulky calcified plaque. 2. No right MCA stenosis or branch occlusion identified. 3. Occluded right vertebral artery origin with intermediate reconstitution in the distal V2 segment, but diminutive intracranial right vertebral artery. Left vertebral artery primarily supplies the basilar with no significant stenosis. 4. Fairly extensive soft atherosclerotic plaque. Multiple areas of ulcerated  plaque at the arch and proximal great vessels. 5. Expected evolution of patchy right MCA infarcts with no hemorrhagic transformation or intracranial mass effect. 6. Stable abnormal visible right lung. Interval increased retained secretions in the distal trachea and mainstem bronchi.  Electronically Signed: By: Genevie Ann M.D. On: 10/15/2014 09:36   Mr Jeri Cos Contrast  10/14/2014   CLINICAL DATA:  81 year old male with syncope. History of lung cancer, unclear recurrence/disease status at this time. Subsequent encounter.  EXAM: MRI HEAD WITH CONTRAST  TECHNIQUE: Multiplanar, multiecho pulse sequences of the brain and surrounding structures were obtained with intravenous contrast.  COMPARISON:  Brain MRI without contrast 0004 hr today, and earlier.  CONTRAST:  69m MULTIHANCE GADOBENATE DIMEGLUMINE 529 MG/ML IV SOLN  FINDINGS: No abnormal enhancement or brain mass identified. No post ischemic enhancement related to the right hemisphere findings reported earlier today. No dural thickening identified. No abnormal enhancement identified in the visualized cervical spine. No suspicious calvarium enhancement identified. Stable cerebral  morphology, no intracranial mass effect.  IMPRESSION: No evidence of metastatic disease to the brain.   Electronically Signed   By: HGenevie AnnM.D.   On: 10/14/2014 21:32    Medications:  Scheduled: . amoxicillin-clavulanate  1 tablet Oral Q12H  . aspirin  300 mg Rectal Daily   Or  . aspirin  325 mg Oral Daily  . atorvastatin  20 mg Oral q1800  . clopidogrel  75 mg Oral Daily  . dextromethorphan-guaiFENesin  1 tablet Oral BID  . feeding supplement (ENSURE ENLIVE)  237 mL Oral TID BM  . fosPHENYtoin (CEREBYX) IV  10 mg PE/kg Intravenous Once  . gabapentin  300 mg Oral QPM  . heparin  5,000 Units Subcutaneous 3 times per day  . mometasone-formoterol  2 puff Inhalation BID  . nicotine  21 mg Transdermal Daily  . phenytoin  300 mg Oral Daily  . predniSONE  20 mg Oral Q breakfast  . sodium chloride  1,500 mL Intravenous Once  . sodium chloride  3 mL Intravenous Q12H   Continuous: . 0.9 % NaCl with KCl 20 mEq / L 75 mL/hr at 10/16/14 0145   PYIR:[id number]**OR** acetaminophen, albuterol, hydrALAZINE, HYDROcodone-acetaminophen, ipratropium-albuterol, morphine injection  Assessment/Plan:  Principal Problem:   CVA (cerebral infarction) Active Problems:   TOBACCO ABUSE   Essential hypertension   Acute thromboembolism of deep veins of lower extremity   GASTROESOPHAGEAL REFLUX DISEASE   Falls   CAP (community acquired pneumonia)   Shortness of breath   FTT (failure to thrive) in adult   Lung cancer   Protein-calorie malnutrition, severe   Cavitary pneumonia   Complex partial seizure   Stroke with cerebral ischemia   Palliative care encounter   Stroke    Acute stroke/previous history of embolic stroke Neurology is following. Evaluation has detected diffuse cerebrovascular disease, especially of the right common carotid artery and internal carotid artery. Neurology has recommended dual antiplatelets treatment. Due to noncompliance and multiple falls, he is not a candidate for  anticoagulation. Palliative medicine has been consulted to determine patient's goals of care. Unfortunately no immediate family is available. Attempts are ongoing to locate his daughter. PT and OT to evaluate. He will most likely end up needing skilled nursing facility. Patient on aspirin and Plavix. Echocardiogram as above. LDL is 82. HbA1c is 6.1. Psychiatric assessment is pending.  Fall Possibly due to the above. PT and OT following.  History of Complex partial seizures Patient's Dilantin level was subtherapeutic. He was loaded with  Dilantin. Continue home dose of Dilantin.  Recent community-acquired pneumonia with possible cavitary lesion Continue Augmentin. Plan was to continue for 3 weeks. CT angiogram of chest was done. No PE was noticed.  History of essential hypertension Does not appear to be on any home medications for same. Continue to monitor  History of lung cancer Patient to follow-up with his oncologist. MRI brain did not show any metastases. CT angiogram of the chest did not show any obvious recurrence of his cancer.  Severe protein calorie malnutrition Continue ensure  Hypokalemia and hyponatremia Potassium repleted. Magnesium is normal.  DVT Prophylaxis: Subcutaneous heparin    Code Status: Full code  Family Communication: Discussed with the patient. Unable to reach family. Disposition Plan: PT and OT evaluation. Will need SNF. Conceivably palliative medicine evaluation can be completed at SNF, if unable to reach family in a reasonable period of time. Psychiatry evaluation pending to determine capacity.    LOS: 3 days   [facility] Pager [phone] 10/16/2014, 9:49 AM  If 7PM-7AM, please contact night-coverage at [email or web address], password [facility]

## 2014-10-16 NOTE — Evaluation (Signed)
Occupational Therapy Evaluation Patient Details Name: Paul [name] MRN: [id number] DOB: 1930-05-09 Today's Date: 10/16/2014    History of Present Illness Mr. MAUDE GLOOR is a 81 y.o. 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, complex partial seizures on dilantin, GERD, tobacco abuse, COPD, diastolic congestive heart failure, recent hospitalization at [facility] due to pneumonia, was admitted after found on the floor at home with inability to move the left arm.Found to have Right brain infarcts largely in a watershed type distribution at [facility] and MCA/PCA territory   Clinical Impression   Pt admitted with above. Unsure of pt's PLOF. Recommending SNF upon d/c. Feel pt will benefit from acute OT to increase independence prior to d/c.     Follow Up Recommendations  SNF;Supervision/Assistance - 24 hour    Equipment Recommendations  Other (comment) (defer to next venue)    Recommendations for Other Services       Precautions / Restrictions Precautions Precautions: Fall Restrictions Weight Bearing Restrictions: No      Mobility Bed Mobility Overal bed mobility: Needs Assistance Bed Mobility: Supine to Sit;Sit to Supine     Supine to sit: Max assist Sit to supine: Min assist   General bed mobility comments: assist with LE when returning to bed. Assist with trunk and LEs when going from supine to sitting position.   Transfers                 General transfer comment: did not stand-pt layed back down and reported he had pain    Balance      assist with balance sitting EOB, but then pt able to hold self up without physical assist.                                      ADL Overall ADL's : Needs assistance/impaired    Eating/Feeding: OT held coffee cup for pt when he was in bed as he had mitten on right hand and pt was agitated in session. Coughing with drinking liquid. Pt also drank coffee sitting EOB.   Grooming: Minimal  assistance;Sitting               Lower Body Dressing: Total assistance;Bed level                 General ADL Comments: Pt sat EOB. OT assisted with giving pt coffee. Pt not pleasant in session and cursing at therapist.     Vision     Perception     Praxis      Pertinent Vitals/Pain Pain Assessment: Faces Faces Pain Scale: Hurts even more Pain Location: head, left hip, left arm, right toe(s) Pain Descriptors / Indicators: Grimacing (verbalizing pain) Pain Intervention(s): Monitored during session;Limited activity within patient's tolerance     Hand Dominance     Extremity/Trunk Assessment Upper Extremity Assessment Upper Extremity Assessment: LUE deficits/detail;Difficult to assess due to impaired cognition LUE Deficits / Details: limited movement of LUE; reports pain   Lower Extremity Assessment Lower Extremity Assessment: Defer to PT evaluation       Communication Communication Communication: HOH   Cognition Arousal/Alertness: Awake/alert Behavior During Therapy: Agitated Overall Cognitive Status: No family/caregiver present to determine baseline cognitive functioning Area of Impairment: Orientation;Following commands;Problem solving;Attention Orientation Level: Disoriented to;Place;Time Current Attention Level: Focused   Following Commands: Follows one step commands inconsistently Safety/Judgement: Decreased awareness of safety   Problem  Solving: Slow processing;Difficulty sequencing;Requires verbal cues;Requires tactile cues;Decreased initiation     General Comments       Exercises       Shoulder Instructions      Home Living Family/patient expects to be discharged to:: Unsure Living Arrangements: Alone Available Help at Discharge:  (unsure) Type of Home: Apartment Home Access: Level entry                     Home Equipment: Walker - 2 wheels   Additional Comments: Patient is a poor historian, Some information pulled from  previous admission      Prior Functioning/Environment Level of Independence: Independent with assistive device(s)        Comments: per PT eval, Reports he uses a walker at baseline; unsure of accurate PLOF    OT Diagnosis: Generalized weakness;Acute pain   OT Problem List: Decreased strength;Decreased cognition;Decreased safety awareness;Decreased knowledge of use of DME or AE;Decreased knowledge of precautions;Impaired UE functional use;Pain;Impaired balance (sitting and/or standing);Decreased activity tolerance   OT Treatment/Interventions: Self-care/ADL training;Therapeutic exercise;DME and/or AE instruction;Therapeutic activities;Cognitive remediation/compensation;Patient/family education;Balance training;Neuromuscular education    OT Goals(Current goals can be found in the care plan section) Acute Rehab OT Goals Patient Stated Goal: none stated OT Goal Formulation: Patient unable to participate in goal setting Time For Goal Achievement: 10/23/14 Potential to Achieve Goals: Fair ADL Goals Pt Will Perform Upper Body Bathing: with min assist;sitting Pt Will Perform Upper Body Dressing: with min assist;sitting Pt Will Transfer to Toilet: with min assist;stand pivot transfer;bedside commode Additional ADL Goal #1: Pt will perform bed mobility at [place] assist level as precursor for ADLs.  OT Frequency: Min 2X/week   Barriers to D/C:            Co-evaluation              End of Session Equipment Utilized During Treatment: Oxygen Nurse Communication: Mobility status;Other (comment) (coughing with liquid)  Activity Tolerance: Patient limited by pain;Other (comment) (decreased cognition) Patient left: in bed;with call bell/phone within reach;with bed alarm set; mitten reapplied to right hand   Time: 1003-1019 (some time spent getting washcloths and socks) OT Time Calculation (min): 16 min Charges:  OT General Charges $OT Visit: 1 Procedure OT Evaluation $Initial OT  Evaluation Tier I: 1 Procedure G-CodesBenito Mccreedy OTR/L [phone] 10/16/2014, 10:34 AM

## 2014-10-16 NOTE — Progress Notes (Signed)
HPI STROKE TEAM PROGRESS NOTE  HPI Paul [name] is an 81 y.o. male with a past medical history significant for HTN, non small cell carcinoma lung, symptomatic complex partial seizures, GERD, tobacco abuse, COPD, diastolic congestive heart failure, recent hospitalization at [facility] due to pneumonia. He refused SNIF placement at discharge. He was discharged on Augmentin for 3 weeks. Patient was found on the floor at home with inability to move the left arm. MRI brain was done upon arrival, personally reviewed, and showed evidence of multiple patchy cortical, subcortical, and periventricular ischemic infarcts seen involving the right frontal, parietal, and occipital lobes in a fairly linear watershed distribution. No associated hemorrhage or mass effect.  He was noted to have change in neuro status around 5:30 and code stroke was activated. NIHSS 17. STAT CT brain was personally reviewed and showed no acute abnormality. Patient is presently awake and alert but is not following commands.  Date last known well: undetermined Time last known well: undetermined tPA Given: no, late presentation NIHSS: 17  MRS:   SUBJECTIVE (INTERVAL HISTORY) No family members present. The patient appears to be in good spirits and is without complaints. Dilantin level still low, will give IV fosphenytoin load. Resume po dilantin.   OBJECTIVE Temp:  [97.7 F (36.5 C)-98.1 F (36.7 C)] 98.1 F (36.7 C) (09/25 1034) Pulse Rate:  [50-77] 74 (09/25 1044) Cardiac Rhythm:  [-] Normal sinus rhythm (09/25 0739) Resp:  [17-18] 17 (09/25 1034) BP: (77-131)/(42-66) 126/66 mmHg (09/25 1044) SpO2:  [97 %-99 %] 99 % (09/25 1034) Weight:  [48.081 kg (106 lb)] 48.081 kg (106 lb) (09/25 0500)  CBC:   Recent Labs Lab 10/13/14 2000 10/14/14 0329 10/15/14 0550  WBC 12.6* 9.9 6.6  NEUTROABS 11.0*  --   --   HGB 11.4* 12.5* 13.5  HCT 35.2* 39.6 41.5  MCV 80.9 82.5 81.7  PLT 346 354 563    Basic Metabolic Panel:    Recent Labs Lab 10/15/14 0550 10/15/14 0941 10/16/14 0613  NA 130*  --  132*  K 3.2*  --  5.0  CL 91*  --  101  CO2 27  --  22  GLUCOSE 75  --  92  BUN 8  --  9  CREATININE 0.73  --  0.52*  CALCIUM 8.5*  --  8.1*  MG  --  2.0  --     Lipid Panel:     Component Value Date/Time   CHOL 150 10/15/2014 0550   CHOL 152 10/15/2014 0550   TRIG 115 10/15/2014 0550   TRIG 109 10/15/2014 0550   HDL 45 10/15/2014 0550   HDL 45 10/15/2014 0550   CHOLHDL 3.3 10/15/2014 0550   CHOLHDL 3.4 10/15/2014 0550   VLDL 23 10/15/2014 0550   VLDL 22 10/15/2014 0550   LDLCALC 82 10/15/2014 0550   LDLCALC 85 10/15/2014 0550   HgbA1c:  Lab Results  Component Value Date   HGBA1C 6.1* 10/14/2014   Urine Drug Screen:     Component Value Date/Time   LABOPIA NONE DETECTED 04/01/2013 1858   COCAINSCRNUR NONE DETECTED 04/01/2013 1858   LABBENZ NONE DETECTED 04/01/2013 1858   AMPHETMU NONE DETECTED 04/01/2013 1858   THCU NONE DETECTED 04/01/2013 1858   LABBARB NONE DETECTED 04/01/2013 1858      IMAGING  I have personally reviewed the radiological images below and agree with the radiology interpretations.   Ct Head Wo Contrast 10/14/2014    No acute intracranial abnormalities. Chronic atrophy and small  vessel ischemic changes diffusely.    10/13/2014    1. No skull fracture or intracranial hemorrhage.  2. Stable atrophy and chronic small vessel white matter ischemic changes.  3. Improved right maxillary sinusitis with an acute component today.  4. Mildly progressive right mastoid opacification.     Ct Angio Chest Pe W/cm &/or Wo Cm 10/14/2014    No evidence of significant pulmonary embolus. Diffuse emphysematous changes throughout the lungs. Areas of consolidation in the right upper lung and right lung base probably related to history of lung cancer. Changes may represent postobstructive consolidation or treatment changes. Patchy airspace disease in the right apex. Loculated gas and  fluid collections in the right pleural space. Mucous plugging.    Mr Brain Wo Contrast 10/14/2014    1. Patchy multi focal ischemic infarcts involving the right frontal, parietal, and occipital lobes, largely in a watershed type distribution. No associated hemorrhage or mass effect.  2. Abnormal flow void within the distal right vertebral artery, which may be related to slow flow or possibly occlusion. Otherwise preserved major intracranial flow voids.  3. Advanced cerebral atrophy with moderate chronic microvascular ischemic changes.     CT Angio Head and Neck 10/15/2014 1. Bulky soft plaque or thrombus in the proximal right CCA with RADIOGRAPHIC STRING SIGN stenosis, 2 cm from the arch. Additional plaque at the right carotid bifurcation is not hemodynamically significant. However, there are tandem high-grade stenoses also in the right ICA siphon related to bulky calcified plaque. 2. No right MCA stenosis or branch occlusion identified. 3. Occluded right vertebral artery origin with intermediate reconstitution in the distal V2 segment, but diminutive intracranial right vertebral artery. Left vertebral artery primarily supplies the basilar with no significant stenosis. 4. Fairly extensive soft atherosclerotic plaque. Multiple areas of ulcerated plaque at the arch and proximal great vessels. 5. Expected evolution of patchy right MCA infarcts with no hemorrhagic transformation or intracranial mass effect. 6. Stable abnormal visible right lung. Interval increased retained secretions in the distal trachea and mainstem bronchi. 7. High-grade stenosis left ICA siphon, and distal left ICA bulb stenosis 65%.   MRI brain with contrast 10/14/2014 No evidence of metastatic disease to the brain.  EEG  10/14/2014 This awake and drowsy EEG is abnormal due to the presence of: 1. Mild diffuse slowing of the waking background. 2. Additional focal slowing over the right temporoparietal  region. Evaluation the  PHYSICAL EXAM  Temp:  [97.7 F (36.5 C)-98.1 F (36.7 C)] 98.1 F (36.7 C) (09/25 1034) Pulse Rate:  [50-77] 74 (09/25 1044) Resp:  [17-18] 17 (09/25 1034) BP: (77-131)/(42-66) 126/66 mmHg (09/25 1044) SpO2:  [97 %-99 %] 99 % (09/25 1034) Weight:  [48.081 kg (106 lb)] 48.081 kg (106 lb) (09/25 0500)  General - cachetic appearance, well developed, in no acute distress.  Ophthalmologic - Fundi not visualized due to noncooperation.  Cardiovascular - Regular rate and rhythm.  Mental Status -  Awake alert, but confused. Not orientation to place, time, but orientated to person and self. Language exam not cooperative, but able to follow simple commands. Severe dysarthria.   Cranial Nerves II - XII - II - blinking to visual threat b/l. III, IV, VI - Extraocular movements intact, no gaze preference. V - Facial sensation intact bilaterally. VII - Facial movement intact bilaterally. VIII - Hearing & vestibular intact bilaterally. X - Palate elevates symmetrically, moderate to severe dysarthria with poor denture. XI - Chin turning & shoulder shrug intact bilaterally. XII - Tongue protrusion intact.  Motor Strength - The patient's strength was LUE 3/5, LLE 3/5, RUE and RLE spontaneous movement and against gravity without difficulty. Bulk was normal and fasciculations were absent.   Motor Tone - Muscle tone was assessed at the neck and appendages and was normal.  Reflexes - The patient's reflexes were 1+ in all extremities and he had no pathological reflexes.  Sensory - not cooperative on exam.    Coordination - The patient not cooperative on exam.  Tremor was absent.  Gait and Station - not tested due to safety concerns.   ASSESSMENT/PLAN [name] is a 81 y.o. 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, complex partial seizures on dilantin, GERD, tobacco abuse, COPD, diastolic congestive heart failure, recent hospitalization  at [facility] due to pneumonia, was admitted after found on the floor at home with inability to move the left arm.  He did not receive IV t-PA due to late presentation.  Stroke:  Right brain infarcts largely in a watershed type distribution at [employer] and MCA/PCA territory. Etiology likely due to Right CCA and cavernous ICA high-grade stenosis in the setting of hypoperfusion. Pt did have embolic stroke in 01/6107 and considered hypercoagulable state and put on coumadin, However, coumadin was discontinued by PCP likely due to fall risk.   Resultant  Left sided weakness  MRI without contrast - right MCA/ACA and MCA/PCA watershed infarcts  MRI with contrast - no evidence of metastatic disease.  CTA of Head and Neck - diffuse cerebrovascular disease as noted above especially right CCA and ICA cavernous segment, as well as left ICA cavernous segment and right VA.  2D Echo - EF 55-60%. No cardiac source of emboli identified.  LDL 82  HgbA1c 6.1  EEG - diffuse slowing as well as right focal slowing, no seizure activity   VTE prophylaxis - subcutaneous heparin DIET DYS 3 Room service appropriate?: Yes; Fluid consistency:: Thin   no antithrombotic prior to admission, now on aspirin '81mg'$  daily and plavix 75 mg daily  Patient counseled to be compliant with his antithrombotic medications  Ongoing aggressive stroke risk factor management  Therapy recommendations: Skilled nursing facility placement recommended  Disposition: Pending  Seizure  Hx of complex partial seizure  On dilantin but on admission, dilantin level < 2.5  Dilantin loaded in ER with 1g  Repeat dilantin level 17 - >10.5   Dilantin level today is 9.5. Again load with fosphenytoin.  Resume po dilantin  Dilantin level daily, goal 15-20  EEG - no seizure activity   MRI with contrast ruled out brain mets.    Hypertension  Labile blood pressures Permissive hypertension (OK if < 220/120) but gradually normalize in 5-7  days Due to severe extracranial and intracranial stenosis, BP goal 130-150 avoid hypotension  Hyperlipidemia  Home meds: No lipid lowering medications prior to admission  LDL 135, goal < 70  Add Lipitor 20 mg daily  Continue statin at discharge  Other Stroke Risk Factors  Advanced age  Cigarette smoker, advised to stop smoking  Malignancy - stage III lung cancer - noncompliant with clinical visit  Other Active Problems  Chronic Hyponatremia - sodium 131-> 130  Malnutrition - on Ensure Enlive  Attempts to contact family members has so far been unsuccessful.  Palliative care has been consulted   Hospital day # 3  Neurology will sign off. Please call with questions. Pt will follow up with Dr. Erlinda Hong at [facility] in about 2 months. Thanks for the consult.  Rosalin Hawking, MD PhD Stroke Neurology 10/16/2014 2:50 PM   To contact Stroke Continuity provider, please refer to [email or web address] After hours, contact General Neurology

## 2014-10-16 NOTE — Progress Notes (Signed)
Patient refused his oral medication this AM. He spit his medicaiton at staffs and verbalized "leave me the hell alone". Offered him meals and fluid but he denied. Staffs (RN & NA) at bedside assisted patient with bath while he impulsively/physically attack staffs with inappropriate remarks. Bath completed. Will continue to monitor.   Ave Filter, RN

## 2014-10-17 DIAGNOSIS — I6991 Cognitive deficits following unspecified cerebrovascular disease: Secondary | ICD-10-CM

## 2014-10-17 LAB — PHENYTOIN LEVEL, TOTAL: Phenytoin Lvl: 12.2 ug/mL (ref 10.0–20.0)

## 2014-10-17 LAB — GLUCOSE, CAPILLARY: GLUCOSE-CAPILLARY: 129 mg/dL — AB (ref 65–99)

## 2014-10-17 MED ORDER — QUETIAPINE FUMARATE 25 MG PO TABS
12.5000 mg | ORAL_TABLET | Freq: Every day | ORAL | Status: DC
  Administered 2014-10-17: 12.5 mg via ORAL
  Filled 2014-10-17: qty 1

## 2014-10-17 MED ORDER — HALOPERIDOL LACTATE 5 MG/ML IJ SOLN
2.5000 mg | Freq: Four times a day (QID) | INTRAMUSCULAR | Status: DC | PRN
  Administered 2014-10-17: 2.5 mg via INTRAVENOUS
  Filled 2014-10-17: qty 1

## 2014-10-17 NOTE — Progress Notes (Signed)
Physical therapy Treatment note  Clinical impression statement:  Pt easily agitated this date with L sided neglect, decreased insight to deficits, L sided weakness, and severe balance impairment. Pt remains unsafe to return home at this time and highly recommend SNF for maximal functional recovery.    10/17/14 1100  PT Visit Information  Last PT Received On 10/17/14  Assistance Needed +2  History of Present Illness [name] is a 81 y.o. 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, complex partial seizures on dilantin, GERD, tobacco abuse, COPD, diastolic congestive heart failure, recent hospitalization at [facility] due to pneumonia, was admitted after found on the floor at home with inability to move the left arm.Found to have Right brain infarcts largely in a watershed type distribution at [employer] and MCA/PCA territory  PT Time Calculation  PT Start Time (ACUTE ONLY) 1016  PT Stop Time (ACUTE ONLY) 1036  PT Time Calculation (min) (ACUTE ONLY) 20 min  Subjective Data  Subjective pt very excited to get out of bed  Patient Stated Goal none state  Precautions  Precautions Fall  Precaution Comments easily frustrated  Restrictions  Weight Bearing Restrictions No  Pain Assessment  Pain Assessment Faces  Faces Pain Scale 8  Pain Location L forearm  Pain Descriptors / Indicators Grimacing (yelling out in pain and cursing at staff)  Pain Intervention(s) Monitored during session  Cognition  Arousal/Alertness Awake/alert  Behavior During Therapy Restless  Overall Cognitive Status No family/caregiver present to determine baseline cognitive functioning  Area of Impairment Orientation;Following commands;Problem solving;Attention  Orientation Level Disoriented to;Place;Time;Situation  Current Attention Level Focused  Memory Decreased recall of precautions;Decreased short-term memory  Following Commands Follows one step commands inconsistently  Safety/Judgement  Decreased awareness of safety;Decreased awareness of deficits  Problem Solving Slow processing;Difficulty sequencing;Requires verbal cues;Requires tactile cues;Decreased initiation  General Comments pt easily agitated, especiall when L UE is touched. pt unaware of L sided deficits  Bed Mobility  Overal bed mobility Needs Assistance  Bed Mobility Supine to Sit;Sit to Supine  Supine to sit Mod assist  General bed mobility comments pt initiated transfer but required assist for trunk elevation and to scoot to EOB  Transfers  Overall transfer level Needs assistance  Equipment used Rolling walker (2 wheeled);None  Transfers Sit to/from Stand  Sit to Stand Max assist;+2 physical assistance  Stand pivot transfers Max assist;+2 physical assistance  General transfer comment pt with extremely poor command follow, unable to use L UE functionally despite being able to grip with L hand. Pt unable to achieve terminal knee extension and trunk extension. pt with strong lean to the left. compeleted 3x.  Ambulation/Gait  Ambulation/Gait assistance Max assist;+2 physical assistance  Ambulation Distance (Feet) 25 Feet  Assistive device Rolling walker (2 wheeled);None  Gait Pattern/deviations Step-to pattern;Shuffle;Trunk flexed;Narrow base of support  General Gait Details pt extremely unsteady with poor sequencing/co-ordination. Pt unable to achieve full upright posture. Pt kept cursing and desire to get to hand rail in hallway however demo'd no insight to deficit on significant falls risk or L sided weakness. Attempted to use RW however pt unable to manage safely or grip with L UE. pt extremely unsafe with and without RW.  Gait velocity decreased  Modified Rankin (Stroke Patients Only)  Pre-Morbid Rankin Score (unable to state)  Modified Rankin 4  Balance  Overall balance assessment Needs assistance  Sitting balance-Leahy Scale Fair  Postural control Left lateral lean  Standing balance support Single  extremity  supported  Standing balance-Leahy Scale Zero  PT - End of Session  Equipment Utilized During Treatment Gait belt  Activity Tolerance Patient limited by fatigue;Patient limited by pain  Patient left in chair;with call bell/phone within reach (pt left a Statistician)  Nurse Communication Mobility status  PT - Assessment/Plan  PT Plan Current plan remains appropriate  PT Frequency (ACUTE ONLY) Min 3X/week  Follow Up Recommendations SNF;Supervision/Assistance - 24 hour  PT equipment None recommended by PT  PT Goal Progression  Progress towards PT goals Not progressing toward goals - comment  PT General Charges  $$ ACUTE PT VISIT 1 Procedure  PT Treatments  $Gait Training 8-22 mins   Kittie Plater, PT, DPT Pager #: [phone] Office #: [phone]

## 2014-10-17 NOTE — Progress Notes (Signed)
[facility] PROGRESS NOTE  LATASHA PUSKAS NWG:[id number] DOB: [birth year] DOA: 10/13/2014  PCP: Junie Panning, NP  Brief HPI: 81 year old Caucasian male with a past medical history of hypertension, GERD, tobacco abuse, COPD, history of complex partial seizures, non-small cell lung cancer, previous history of stroke, presented with fall and possible syncope. On examination, he was found to have left-sided weakness. MRI showed a right-sided stroke. Of note, patient was discharged from [facility] on September 22 after being managed for pneumonia.  Past medical history:  Past Medical History  Diagnosis Date  . Hypertension   . COPD (chronic obstructive pulmonary disease)   . Hemorrhoids     external  . 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 GERD (gastroesophageal reflux disease)   . Emphysema   . HOH (hard of hearing)   . Pneumonia   . Seizures   . Headache(784.0)   . Non-small cell carcinoma of lung dx'd 2006    stage 3  . Lung cancer   . Stroke     Consultants: Neurology, palliative medicine, psychiatry  Procedures:  2-D echocardiogram Study Conclusions - Left ventricle: The cavity size was normal. Systolic function wasnormal. The estimated ejection fraction was in the range of 55%to 60%. Wall motion was normal; there were no regional wallmotion abnormalities. Doppler parameters are consistent withabnormal left ventricular relaxation (grade 1 diastolicdysfunction). There was no evidence of elevated ventricularfilling pressure by Doppler parameters. - Aortic valve: Trileaflet; normal thickness leaflets. There was noregurgitation. - Aortic root: The aortic root was normal in size. - Mitral valve: There was mild regurgitation. - Left atrium: The atrium was normal in size. - Right ventricle: Systolic function was normal. - Right atrium: The atrium was normal in size. - Tricuspid valve: There was no regurgitation. - Pulmonary arteries:  Systolic pressure was within the normalrange. - Inferior vena cava: The vessel was normal in size. - Pericardium, extracardiac: There was no pericardial effusion. Impressions: - This is a limited echocardiogram with only subcostal images.There appears to be normal chamber size, normal biventricularsystolic function.There is abnormal relaxation with normal filling pressures.Mild mitral regurgitation.Normal RVSP.  Antibiotics: Augmentin  Subjective: Patient denies any complaints this morning. No change in her left upper and lower extremity strength and mobility. Still occasionally belligerent with staff.  Objective: Vital Signs  Filed Vitals:   10/17/14 0456 10/17/14 0554 10/17/14 0916 10/17/14 0951  BP:  99/44  99/47  Pulse:  96  99  Temp:  97.7 F (36.5 C)  97.6 F (36.4 C)  TempSrc:  Axillary  Oral  Resp:  16  16  Weight: 46.72 kg (103 lb)     SpO2:  96% 96% 91%    Intake/Output Summary (Last 24 hours) at 10/17/14 0953 Last data filed at 10/17/14 5784  Gross per 24 hour  Intake   1728 ml  Output    200 ml  Net   1528 ml   Filed Weights   10/14/14 0100 10/16/14 0500 10/17/14 0456  Weight: 50.531 kg (111 lb 6.4 oz) 48.081 kg (106 lb) 46.72 kg (103 lb)    General appearance: alert, cooperative, appears stated age and no distress Resp: clear to auscultation bilaterally Cardio: regular rate and rhythm, S1, S2 normal, no murmur, click, rub or gallop GI: soft, non-tender; bowel sounds normal; no masses,  no organomegaly Extremities: His left forearm is covered in a dressing. There is a skin tear. This is noted to be tender. Neurologic: Improved  mobility in the left arm and leg. Those same as yesterday. Speech is improved.   Lab Results:  Basic Metabolic Panel:  Recent Labs Lab 10/12/14 0515 10/13/14 2000 10/14/14 0329 10/15/14 0550 10/15/14 0941 10/16/14 0613  NA 130* 134* 131* 130*  --  132*  K 4.0 4.2 4.1 3.2*  --  5.0  CL 96* 100* 94* 91*  --  101  CO2  '26 28 29 27  '$ --  22  GLUCOSE 106* 123* 112* 75  --  92  BUN '10 9 6 8  '$ --  9  CREATININE 0.40* 0.49* 0.60* 0.73  --  0.52*  CALCIUM 8.4* 8.5* 8.6* 8.5*  --  8.1*  MG  --   --   --   --  2.0  --    Liver Function Tests:  Recent Labs Lab 10/13/14 2000  AST 23  ALT 16*  ALKPHOS 97  BILITOT 0.5  PROT 5.7*  ALBUMIN 2.0*   CBC:  Recent Labs Lab 10/11/14 0525 10/12/14 0515 10/13/14 2000 10/14/14 0329 10/15/14 0550  WBC 9.0 7.9 12.6* 9.9 6.6  NEUTROABS  --   --  11.0*  --   --   HGB 11.4* 11.2* 11.4* 12.5* 13.5  HCT 35.7* 34.4* 35.2* 39.6 41.5  MCV 83.0 81.9 80.9 82.5 81.7  PLT 379 354 346 354 398   BNP (last 3 results)  Recent Labs  10/14/14 0329  BNP 317.4*   CBG:  Recent Labs Lab 10/14/14 0639 10/15/14 0752 10/16/14 0725 10/17/14 0636  GLUCAP 132* 89 104* 129*    Recent Results (from the past 240 hour(s))  Culture, blood (routine x 2)     Status: None   Collection Time: 10/10/14  1:14 PM  Result Value Ref Range Status   Specimen Description BLOOD LEFT ANTECUBITAL  Final   Special Requests BOTTLES DRAWN AEROBIC AND ANAEROBIC 5.5CC  Final   Culture   Final    NO GROWTH 5 DAYS Performed at [facility]    Report Status 10/15/2014 FINAL  Final  Culture, blood (x 2)     Status: None (Preliminary result)   Collection Time: 10/13/14 11:24 PM  Result Value Ref Range Status   Specimen Description BLOOD LEFT HAND  Final   Special Requests BOTTLES DRAWN AEROBIC AND ANAEROBIC 5CC  Final   Culture NO GROWTH 2 DAYS  Final   Report Status PENDING  Incomplete  Culture, blood (x 2)     Status: None (Preliminary result)   Collection Time: 10/13/14 11:43 PM  Result Value Ref Range Status   Specimen Description BLOOD RIGHT ARM  Final   Special Requests BOTTLES DRAWN AEROBIC AND ANAEROBIC 5CC  Final   Culture NO GROWTH 2 DAYS  Final   Report Status PENDING  Incomplete      Studies/Results: No results found.  Medications:  Scheduled: .  amoxicillin-clavulanate  1 tablet Oral Q12H  . aspirin EC  81 mg Oral Daily  . atorvastatin  20 mg Oral q1800  . clopidogrel  75 mg Oral Daily  . dextromethorphan-guaiFENesin  1 tablet Oral BID  . feeding supplement (ENSURE ENLIVE)  237 mL Oral TID BM  . gabapentin  300 mg Oral QPM  . heparin  5,000 Units Subcutaneous 3 times per day  . mometasone-formoterol  2 puff Inhalation BID  . nicotine  21 mg Transdermal Daily  . phenytoin  300 mg Oral Daily  . predniSONE  20 mg Oral Q breakfast  . sodium  chloride  1,500 mL Intravenous Once  . sodium chloride  3 mL Intravenous Q12H   Continuous: . 0.9 % NaCl with KCl 20 mEq / L 75 mL/hr at 10/17/14 0218   WOE:[id number] **OR** acetaminophen, albuterol, hydrALAZINE, HYDROcodone-acetaminophen, ipratropium-albuterol, morphine injection, RESOURCE THICKENUP CLEAR  Assessment/Plan:  Principal Problem:   CVA (cerebral infarction) Active Problems:   TOBACCO ABUSE   Essential hypertension   Acute thromboembolism of deep veins of lower extremity   GASTROESOPHAGEAL REFLUX DISEASE   Falls   CAP (community acquired pneumonia)   Shortness of breath   FTT (failure to thrive) in adult   Lung cancer   Protein-calorie malnutrition, severe   Cavitary pneumonia   Complex partial seizure   Stroke with cerebral ischemia   Palliative care encounter   Stroke    Acute stroke/previous history of embolic stroke Evaluation has detected diffuse cerebrovascular disease, especially of the right common carotid artery and internal carotid artery. Neurology has recommended dual antiplatelets treatment. Due to noncompliance and multiple falls, he is not a candidate for anticoagulation. Palliative medicine has been consulted to determine patient's goals of care. Unfortunately no immediate family is available. Attempts are ongoing to locate his daughter. PT and OT recommends SNF. Patient agreeable to go to one. Patient on aspirin and Plavix. Echocardiogram as above.  LDL is 82. HbA1c is 6.1. Psychiatric assessment is pending to determine capacity.  Fall Possibly due to the above. PT and OT following.  History of Complex partial seizures Patient's Dilantin level was subtherapeutic. He was loaded with Dilantin. Continue home dose of Dilantin. Dilantin level this morning is 12. Albumen is 2.0. Corrected level is 24. No changes to his dosage.  Recent community-acquired pneumonia with possible cavitary lesion Continue Augmentin. Plan was to continue for 3 weeks. CT angiogram of chest was done. No PE was noticed.  History of essential hypertension Does not appear to be on any home medications for same. Continue to monitor  History of lung cancer Patient to follow-up with his oncologist. MRI brain did not show any metastases. CT angiogram of the chest did not show any obvious recurrence of his cancer.  Severe protein calorie malnutrition Continue ensure  Hypokalemia and hyponatremia Potassium repleted. Magnesium is normal.  DVT Prophylaxis: Subcutaneous heparin    Code Status: Full code  Family Communication: Discussed with the patient. Unable to reach family. Disposition Plan: PT and OT evaluation. Will need SNF. Conceivably palliative medicine evaluation can be completed at SNF, if unable to reach family in a reasonable period of time. Psychiatry evaluation pending to determine capacity. Anticipate discharge tomorrow.    LOS: 4 days   [facility] Pager [phone] 10/17/2014, 9:53 AM  If 7PM-7AM, please contact night-coverage at [email or web address], password [facility]

## 2014-10-17 NOTE — Progress Notes (Signed)
Brief Palliative medicine note:  Spoke with apartment property manager Rhae Lerner who also stated she had tried talking to him about getting his emergency contacts on file; she was only able to find 2 numbers for what is reported as his sisters: Priscille Loveless [phone]) and Bunnie Domino [phone]). I attempted to call both of these numbers and they are disconnected. Patient reports that these were his sisters but they passed away. Called and spoke with Nolberto Hanlon, his neighbor. She says that Jeneen Rinks (she refers to him by his middle name Vicente Serene) has relied on her to get to medical appointments and assistance at home but has never been formally specified as HCPOA. Olin Hauser called his friend Alvester Chou to see if he had any phone numbers for his daughter and she is awaiting this call back today. She also says that Javante has told her that he was agreeable to go to SNF, they had talked about [facility]. He also tells me that he is agreeable to go to rehab as long as he leaves the hospital, he wants to "get out of this place".  Tawanna Sat, MD 10/17/2014, 11:52 AM PGY-3, [place]

## 2014-10-17 NOTE — Consult Note (Signed)
[facility] Consult   Reason for Consult:  Capacity evaluation Referring Physician:  Dr. Maryland Pink Patient Identification: Paul [name] MRN:  [id number] Principal Diagnosis: CVA (cerebral infarction) Diagnosis:   Patient Active Problem List   Diagnosis Date Noted  . Palliative care encounter [Z51.5]   . Stroke [I63.9]   . Complex partial seizure [G40.209] 10/14/2014  . Altered mental status [R41.82]   . Stroke with cerebral ischemia [I63.9]   . Sepsis [A41.9]   . Faintness [R55]   . Weakness [R53.1]   . Protein-calorie malnutrition, severe [E43] 10/12/2014  . Cavitary pneumonia [J18.9, J98.4] 10/12/2014  . Lung cancer [C34.90]   . CAP (community acquired pneumonia) [J18.9] 10/10/2014  . Shortness of breath [R06.02] 10/10/2014  . FTT (failure to thrive) in adult [R62.7] 10/10/2014  . CVA (cerebral infarction) [I63.9] 06/10/2013  . Acute CVA (cerebrovascular accident) [I63.9] 06/10/2013  . Accidental drug overdose--narcotics [T50.901A] 04/01/2013  . Chronic osteomyelitis of left foot s/p toe amputation and ischemic changes [M86.672] 04/01/2013  . Acute encephalopathy [G93.40] 04/01/2013  . Falls [id number]] 07/15/2012  . Mass of right ear canal [H93.8X1] 07/15/2012  . Dysphagia [R13.10] 01/16/2012  . Cellulitis of left lower extremity [L03.116] 01/16/2012  . PERSONAL HISTORY, VENOUS THROMBOSIS AND EMBOLISM [Z86.718] 03/23/2008  . Acute thromboembolism of deep veins of lower extremity [I82.409] 12/03/2006  . INSOMNIA [G47.00] 12/03/2006  . TOBACCO ABUSE [Z72.0] 01/02/2006  . Essential hypertension [I10] 01/02/2006  . EXTERNAL HEMORRHOIDS [K64.8] 01/02/2006  . COPD, MILD [J44.9] 01/02/2006  . GASTROESOPHAGEAL REFLUX DISEASE [K21.9] 01/02/2006  . BURSITIS, SHOULDER [M75.50] 01/02/2006  . SEIZURE DISORDER [R56.9] 01/02/2006  . ALCOHOL ABUSE, HX OF [F10.21] 01/02/2006  . CARCINOMA, LUNG, SQUAMOUS CELL [C34.90] 02/07/2004    Total Time spent with patient: 45  minutes  Subjective:   Paul [name] is a 81 y.o. male patient admitted with fall/cerebral infarction.  HPI:  Paul [name] is a 81 y.o. male seen face-to-face for psychiatric consultation and evaluation of capacity evaluation. Patient appeared lying on his bed, awake but not alert to his surroundings, seems to be very fragile and talking with the speech therapist with limited insight into his clinical condition. Patient could not recognize his speech therapist and also does not know why he was in hospital. Patient does not able to voice his medical problems are needed treatment. Patient knows his first name, middle name last name and his date of birth but it could not tell me current year, date, month, season or name of the hospital or family members. Patient seems to be having significant difficulties with orientation, concentration, memory both short-term and delayed memory and has limited language functions. Patient is a resident of skilled nursing facility and has been suffering with multiple medical problems and has a recent fall for unknown reason. As per the physical therapy evaluation patient needed assistance with the 2 people to use a walker for mobility.  Medical history: Patient with PMH of hypertension, GERD, tobacco abuse, COPD, complex partial seizure, non-small cell lung cancer, stroke, diastolic congestive heart failure, who presents with fall and possible syncope. Patient was recently hospitalized from 9/19-9/22 and was treated for pneumonia. He was found to have multilobar pneumonia with CT clarifying cavitary lesion pneumonia and increasing right upper nodule size concerning for lung cancer recurrence. He refused SNF placement at discharge. He was discharged on Augmentin for 3 weeks. Pt states he went to get up and walk when he fell and could not get up. Pt is not sure  why he fell but he could not get up on his own.He has weakness over arm. EMS arrived and found him complaint of pain  in his left arm with skin tears. He denies headache. He has mild shortness of breath and mild cough. No fever or chills. He does not have vision change or hearing loss. He does not have weakness in his legs. Patient does not have abdominal pain, diarrhea, symptoms of UTI.  In ED, patient was found to have INR 1.16, activity urinalysis, tachycardia, normal temperature, WBC 12.6, electrolytes okay. Dilantin level less than 2.5. CT head is negative for acute abnormalities. Chest x-ray of her left forearm is negative for bony fracture. Patient is admitted to inpatient for further evaluation and treatment.  Past Medical History:  Past Medical History  Diagnosis Date  . Hypertension   . COPD (chronic obstructive pulmonary disease)   . Hemorrhoids     external  . 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 GERD (gastroesophageal reflux disease)   . Emphysema   . HOH (hard of hearing)   . Pneumonia   . Seizures   . Headache(784.0)   . Non-small cell carcinoma of lung dx'd 2006    stage 3  . Lung cancer   . Stroke     Past Surgical History  Procedure Laterality Date  . Inner ear surgery    . Toe amputation Left   . Tonsillectomy    . Amputation Left 04/16/2013    Procedure: AMPUTATION RAY;  Surgeon: Newt Minion, MD;  Location: [facility];  Service: Orthopedics;  Laterality: Left;  Left Foot 4th and 5th Ray Amputation  . Lipoma excision Left 09/08/2013    Procedure: EXCISION OF LEFT PINNA SKIN CANCER WITH FROZEN SECTION ;  Surgeon: Izora Gala, MD;  Location: [facility] OR;  Service: ENT;  Laterality: Left;   Family History:  Family History  Problem Relation Age of Onset  . Heart failure Brother    Social History:  History  Alcohol Use No     History  Drug Use No    Social History   Social History  . Marital Status: Divorced    Spouse Name: N/A  . Number of Children: 2  . Years of Education: 12   Social History Main Topics  . Smoking status: Current Every Day Smoker --  1.50 packs/day for 60 years    Types: Cigarettes  . Smokeless tobacco: Never Used  . Alcohol Use: No  . Drug Use: No  . Sexual Activity: No   Other Topics Concern  . None   Social History Narrative   Lives alone    Drinks a lot of caffeine per the pt (5 or greater a day)   Right handed   Additional Social History:                          Allergies:  No Known Allergies  Labs:  Results for orders placed or performed during the hospital encounter of 10/13/14 (from the past 48 hour(s))  Basic metabolic panel     Status: Abnormal   Collection Time: 10/16/14  6:13 AM  Result Value Ref Range   Sodium 132 (L) 135 - 145 mmol/L   Potassium 5.0 3.5 - 5.1 mmol/L    Comment: DELTA CHECK NOTED SLIGHT HEMOLYSIS    Chloride 101 101 - 111 mmol/L   CO2 22 22 - 32 mmol/L   Glucose,  Bld 92 65 - 99 mg/dL   BUN 9 6 - 20 mg/dL   Creatinine, Ser 0.52 (L) 0.61 - 1.24 mg/dL   Calcium 8.1 (L) 8.9 - 10.3 mg/dL   GFR calc non Af Amer >60 >60 mL/min   GFR calc Af Amer >60 >60 mL/min    Comment: (NOTE) The eGFR has been calculated using the CKD EPI equation. This calculation has not been validated in all clinical situations. eGFR's persistently <60 mL/min signify possible Chronic Kidney Disease.    Anion gap 9 5 - 15  Phenytoin level, total     Status: Abnormal   Collection Time: 10/16/14  6:13 AM  Result Value Ref Range   Phenytoin Lvl 9.5 (L) 10.0 - 20.0 ug/mL  Glucose, capillary     Status: Abnormal   Collection Time: 10/16/14  7:25 AM  Result Value Ref Range   Glucose-Capillary 104 (H) 65 - 99 mg/dL  Phenytoin level, total     Status: None   Collection Time: 10/17/14  5:25 AM  Result Value Ref Range   Phenytoin Lvl 12.2 10.0 - 20.0 ug/mL  Glucose, capillary     Status: Abnormal   Collection Time: 10/17/14  6:36 AM  Result Value Ref Range   Glucose-Capillary 129 (H) 65 - 99 mg/dL   Comment 1 Notify RN    Comment 2 Document in Chart     Vitals: Blood pressure 107/61,  pulse 94, temperature 97.7 F (36.5 C), temperature source Oral, resp. rate 16, weight 46.72 kg (103 lb), SpO2 96 %.  Risk to Self:   Risk to Others:   Prior Inpatient Therapy:   Prior Outpatient Therapy:    Current Facility-Administered Medications  Medication Dose Route Frequency Provider Last Rate Last Dose  . 0.9 % NaCl with KCl 20 mEq/ L  infusion   Intravenous Continuous Bonnielee Haff, MD   Stopped at 10/17/14 1020  . acetaminophen (TYLENOL) tablet 650 mg  650 mg Oral Q6H PRN Ivor Costa, MD       Or  . acetaminophen (TYLENOL) suppository 650 mg  650 mg Rectal Q6H PRN Ivor Costa, MD      . albuterol (PROVENTIL) (2.5 MG/3ML) 0.083% nebulizer solution 2.5 mg  2.5 mg Nebulization Q2H PRN Ivor Costa, MD      . amoxicillin-clavulanate (AUGMENTIN) 875-125 MG per tablet 1 tablet  1 tablet Oral Q12H Ivor Costa, MD   1 tablet at 10/17/14 1007  . aspirin EC tablet 81 mg  81 mg Oral Daily Rosalin Hawking, MD   81 mg at 10/17/14 1010  . atorvastatin (LIPITOR) tablet 20 mg  20 mg Oral q1800 David L Rinehuls, PA-C   20 mg at 10/15/14 1755  . clopidogrel (PLAVIX) tablet 75 mg  75 mg Oral Daily Rosalin Hawking, MD   75 mg at 10/17/14 1007  . dextromethorphan-guaiFENesin (MUCINEX DM) 30-600 MG per 12 hr tablet 1 tablet  1 tablet Oral BID Ivor Costa, MD   1 tablet at 10/17/14 1010  . feeding supplement (ENSURE ENLIVE) (ENSURE ENLIVE) liquid 237 mL  237 mL Oral TID BM Ivor Costa, MD   237 mL at 10/17/14 1342  . gabapentin (NEURONTIN) capsule 300 mg  300 mg Oral QPM Ivor Costa, MD   300 mg at 10/15/14 1755  . heparin injection 5,000 Units  5,000 Units Subcutaneous 3 times per day Ivor Costa, MD   5,000 Units at 10/17/14 1339  . hydrALAZINE (APRESOLINE) injection 5 mg  5 mg Intravenous Q2H PRN Soledad Gerlach  Blaine Hamper, MD      . HYDROcodone-acetaminophen (NORCO/VICODIN) 5-325 MG per tablet 1 tablet  1 tablet Oral Q6H PRN Ivor Costa, MD      . ipratropium-albuterol (DUONEB) 0.5-2.5 (3) MG/3ML nebulizer solution 3 mL  3 mL Nebulization Q4H  PRN Bonnielee Haff, MD      . mometasone-formoterol (DULERA) 100-5 MCG/ACT inhaler 2 puff  2 puff Inhalation BID Ivor Costa, MD   2 puff at 10/17/14 0915  . morphine 2 MG/ML injection 2 mg  2 mg Intravenous Q3H PRN Dory Horn, NP   2 mg at 10/17/14 1038  . nicotine (NICODERM CQ - dosed in mg/24 hours) patch 21 mg  21 mg Transdermal Daily Ivor Costa, MD   21 mg at 10/17/14 1012  . phenytoin (DILANTIN) ER capsule 300 mg  300 mg Oral Daily Rosalin Hawking, MD   300 mg at 10/17/14 1006  . predniSONE (DELTASONE) tablet 20 mg  20 mg Oral Q breakfast Ivor Costa, MD   20 mg at 10/17/14 4481  . [place]   Oral PRN Bonnielee Haff, MD      . sodium chloride 0.9 % bolus 1,500 mL  1,500 mL Intravenous Once Ivor Costa, MD      . sodium chloride 0.9 % injection 3 mL  3 mL Intravenous Q12H Ivor Costa, MD   3 mL at 10/17/14 1013    Musculoskeletal: Strength & Muscle Tone: decreased Gait & Station: unable to stand Patient leans: N/A  Psychiatric Specialty Exam: Physical Exam as per history and physical   ROS generalized weakness, dry skin, limited cognitions denied nausea, vomiting, chest pain and shortness of breath No Fever-chills, No Headache, No changes with Vision or hearing, reports vertigo No problems swallowing food or Liquids, No Chest pain, Cough or Shortness of Breath, No Abdominal pain, No Nausea or Vommitting, Bowel movements are regular, No Blood in stool or Urine, No dysuria, No new skin rashes or bruises, No new joints pains-aches,  No new weakness, tingling, numbness in any extremity, No recent weight gain or loss, No polyuria, polydypsia or polyphagia,  A full 10 point Review of Systems was done, except as stated above, all other Review of Systems were negative.  Blood pressure 107/61, pulse 94, temperature 97.7 F (36.5 C), temperature source Oral, resp. rate 16, weight 46.72 kg (103 lb), SpO2 96 %.Body mass index is 14.37 kg/(m^2).  General Appearance: Disheveled  and Guarded  Engineer, water::  Fair  Speech:  Blocked and Slow  Volume:  Decreased  Mood:  Anxious  Affect:  Constricted and Depressed  Thought Process:  Loose and Tangential  Orientation:  Other:  Oriented to his name and date of birth only  Thought Content:  Rumination  Suicidal Thoughts:  No  Homicidal Thoughts:  No  Memory:  Immediate;   Poor Recent;   Poor  Judgement:  Impaired  Insight:  Shallow  Psychomotor Activity:  Decreased  Concentration:  Poor  Recall:  Poor  Fund of Knowledge:Poor  Language: Fair  Akathisia:  Negative  Handed:  Right  AIMS (if indicated):     Assets:  Leisure Time Transportation  ADL's:  Impaired  Cognition: Impaired,  Moderate  Sleep:      Medical Decision Making: Review of Psycho-Social Stressors (1), Review or order clinical lab tests (1), Established Problem, Worsening (2), Review of Last Therapy Session (1), Review or order medicine tests (1), Review of Medication Regimen & Side Effects (2) and Review of New Medication or  Change in Dosage (2)  Treatment Plan Summary: Daily contact with patient to assess and evaluate symptoms and progress in treatment and Medication management  Plan:  Patient does not meet criteria for capacity to make his own medical decisions or living arrangements Patient will be better served at skilled nursing facility as he cannot care for himself and also has significant loss of memory Please contact psychiatric social service if needed assistance with medical care power of attorney Patient does not meet criteria for psychiatric inpatient admission. Supportive therapy provided about ongoing stressors.  Appreciate psychiatric consultation and we sign off at this time Please contact [phone] or [phone] if needs further assistance   Disposition: Patient will be better served at skilled nursing facility when medically stable. Reportedly patient is willing to be back to the skilled nursing facility and known as a resident  of skilled nursing facility before admission.  JONNALAGADDA,JANARDHAHA R. 10/17/2014 1:54 PM

## 2014-10-17 NOTE — Consult Note (Signed)
WOC consult requested for skin tear to left arm.  This was consult was performed on 9/23; refer to previous progress notes for assessment and plan of care.  Topical treatment orders have been provided for staff nurses. Please re-consult if further assistance is needed.  Thank-you,  Julien Girt MSN, [place], [place], [place], [place]

## 2014-10-17 NOTE — Care Management Important Message (Signed)
Important Message  Patient Details  Name: Paul [name] MRN: [id number] Date of Birth: 22-Jul-1930   Medicare Important Message Given:  Yes-second notification given    Delorse Lek 10/17/2014, 3:18 PM

## 2014-10-17 NOTE — Clinical Social Work Note (Signed)
CSW Consult Acknowledged:   CSW received a consult for SNF placement. CSW also informed that a Palliative Care consult has been place. CSW will allow the Palliative Care to meet with the POA Olin Hauser to discuss goal of care prior to the CSW discussing discharge plan.      [place], MSW, [place]

## 2014-10-17 NOTE — Progress Notes (Signed)
Update: family member found. Per niece, [name], hx of alcoholism, which drove son and daughter away.  Family attempting to locate.Pt has had no contact with son or daughter for years. She also reports that the patient was driving at home though he lost his lisence due to a DWI years ago. Niece's contact information added to chart. Niece expresses concerns about Pam's, the neighbor, influence on the patient.  Joycelyn Schmid, RN

## 2014-10-17 NOTE — Progress Notes (Signed)
Patient pulled off his own brief and soiled the bed. RN and NA performed a full linen change. During this time, he became verbally and physically abusive toward staff, hitting, scratching, grabbing, and throwing items at staff. Repeated to the patient that "we are here to help you". Asked if he needed anything to which he replied to leave him alone and continued to be verbally abusive and threaten to hit the staff. Linen change completed. Will continue to monitor.  Joycelyn Schmid, RN

## 2014-10-17 NOTE — Clinical Social Work Note (Addendum)
CSW left a voice message for Olin Hauser to discuss SNF placement. CSW contacted [employer] CSW requested the police to do a home visit from the pt's niece Bunnie Domino at her last known address [address] [place] [phone].   CSW spoke with Bunnie Domino (niece) [phone]. Vecie reported that she will call her cousin Micheline Rough [phone] home, cell [phone] the pt's other (niece) to discuss who will be the pt point of contact.   CSW awaiting a call back.    [place], MSW, [place]

## 2014-10-17 NOTE — Progress Notes (Signed)
Speech Language Pathology Treatment: Dysphagia  Patient Details Name: Paul [name] MRN: [id number] DOB: [birth year] Today's Date: 10/17/2014 Time: 6979-4801 SLP Time Calculation (min) (ACUTE ONLY): 15 min  Assessment / Plan / Recommendation Clinical Impression  Pt remains confused and disoriented to current situation, although is calmer and more agreeable to PO intake than has been described in previous visits. Although he declined solids, he drank nectar thick liquids via straw with minimal cueing and no overt signs of aspiration. Risk will likely fluctuate with his mentation, but at this time aspiration risk appears minimized with use of nectar thick liquids. Would continue current diet.   HPI Other Pertinent Information: 81 y.o. male with PMH of hypertension, GERD, tobacco abuse, COPD, complex partial seizure, non-small cell lung cancer, stroke, diastolic congestive heart failure, who presents with fall and possible syncope. recently hospitalized from 9/19-9/22 and was treated for pneumonia. He was found to have multilobar pneumonia with CT clarifying cavitary lesion pneumonia and increasing right upper nodule size concerning for lung cancer recurrence. MRI brain findings consistent with a right MCA territory infarct.  Pt has been confused, easily irritated and refusing POs.  During recent admission to [facility], pt had swallow evaluated on 9/21 with results concerning for baseline aspiration risk secondary to his COPD, ? impact of prior CVA and deconditioning. Coughing noted at baseline due to retained secretions but this also occurred consistently with intake (after approximately 80% of boluses).    Pertinent Vitals Pain Assessment: Faces Faces Pain Scale: No hurt  SLP Plan  Continue with current plan of care    Recommendations Diet recommendations: Dysphagia 3 (mechanical soft);Nectar-thick liquid Liquids provided via: Cup;Straw Medication Administration: Whole meds with puree Supervision:  Full supervision/cueing for compensatory strategies Compensations: Slow rate;Follow solids with liquid;Small sips/bites Postural Changes and/or Swallow Maneuvers: Seated upright 90 degrees;Upright 30-60 min after meal       Oral Care Recommendations: Oral care BID Follow up Recommendations: Skilled Nursing facility Plan: Continue with current plan of care    Germain Osgood, M.A. CCC-SLP [phone]  Germain Osgood 10/17/2014, 2:39 PM

## 2014-10-18 LAB — GLUCOSE, CAPILLARY: Glucose-Capillary: 109 mg/dL — ABNORMAL HIGH (ref 65–99)

## 2014-10-18 LAB — PHENYTOIN LEVEL, TOTAL: Phenytoin Lvl: 12.3 ug/mL (ref 10.0–20.0)

## 2014-10-18 MED ORDER — OXYCODONE HCL 5 MG PO TABS: 2.5000 mg | ORAL_TABLET | Freq: Three times a day (TID) | ORAL | Status: DC

## 2014-10-18 MED ORDER — ACETAMINOPHEN 325 MG PO TABS: 650.0000 mg | ORAL_TABLET | Freq: Four times a day (QID) | ORAL | Status: AC | PRN

## 2014-10-18 MED ORDER — CLOPIDOGREL BISULFATE 75 MG PO TABS: 75.0000 mg | ORAL_TABLET | Freq: Every day | ORAL | Status: DC

## 2014-10-18 MED ORDER — AMOXICILLIN-POT CLAVULANATE 875-125 MG PO TABS: 1.0000 | ORAL_TABLET | Freq: Two times a day (BID) | ORAL | Status: AC

## 2014-10-18 MED ORDER — QUETIAPINE FUMARATE 25 MG PO TABS: 12.5000 mg | ORAL_TABLET | Freq: Every day | ORAL | Status: DC

## 2014-10-18 MED ORDER — NICOTINE 21 MG/24HR TD PT24: 21.0000 mg | MEDICATED_PATCH | Freq: Every day | TRANSDERMAL | Status: AC

## 2014-10-18 MED ORDER — ACETAMINOPHEN 325 MG PO TABS
650.0000 mg | ORAL_TABLET | Freq: Three times a day (TID) | ORAL | Status: DC
  Administered 2014-10-18: 650 mg via ORAL
  Filled 2014-10-18: qty 2

## 2014-10-18 MED ORDER — ENSURE ENLIVE PO LIQD: 237.0000 mL | Freq: Three times a day (TID) | ORAL | Status: AC

## 2014-10-18 MED ORDER — ACETAMINOPHEN 325 MG PO TABS: 650.0000 mg | ORAL_TABLET | Freq: Three times a day (TID) | ORAL | Status: DC

## 2014-10-18 MED ORDER — ASPIRIN 81 MG PO TBEC: 81.0000 mg | DELAYED_RELEASE_TABLET | Freq: Every day | ORAL | Status: DC

## 2014-10-18 MED ORDER — GABAPENTIN 300 MG PO CAPS: 300.0000 mg | ORAL_CAPSULE | Freq: Every evening | ORAL | Status: DC

## 2014-10-18 MED ORDER — PREGABALIN 25 MG PO CAPS: 25.0000 mg | ORAL_CAPSULE | Freq: Two times a day (BID) | ORAL | Status: AC

## 2014-10-18 MED ORDER — OXYCODONE HCL 5 MG PO TABS: 2.5000 mg | ORAL_TABLET | Freq: Three times a day (TID) | ORAL | Status: AC

## 2014-10-18 MED ORDER — ACETAMINOPHEN 325 MG PO TABS: 650.0000 mg | ORAL_TABLET | Freq: Three times a day (TID) | ORAL | Status: AC

## 2014-10-18 MED ORDER — IPRATROPIUM-ALBUTEROL 0.5-2.5 (3) MG/3ML IN SOLN: 3.0000 mL | RESPIRATORY_TRACT | Status: AC | PRN

## 2014-10-18 MED ORDER — OLANZAPINE 5 MG PO TABS: 5.0000 mg | ORAL_TABLET | Freq: Every day | ORAL | Status: AC

## 2014-10-18 MED ORDER — PHENYTOIN SODIUM EXTENDED 300 MG PO CAPS: 300.0000 mg | ORAL_CAPSULE | Freq: Every day | ORAL | Status: AC

## 2014-10-18 MED ORDER — PREGABALIN 25 MG PO CAPS
25.0000 mg | ORAL_CAPSULE | Freq: Two times a day (BID) | ORAL | Status: DC
  Administered 2014-10-18: 25 mg via ORAL
  Filled 2014-10-18: qty 1

## 2014-10-18 MED ORDER — OLANZAPINE 5 MG PO TABS
5.0000 mg | ORAL_TABLET | Freq: Every day | ORAL | Status: DC
  Filled 2014-10-18: qty 1

## 2014-10-18 MED ORDER — OXYCODONE HCL 5 MG PO TABS
2.5000 mg | ORAL_TABLET | Freq: Three times a day (TID) | ORAL | Status: DC
  Administered 2014-10-18: 2.5 mg via ORAL
  Filled 2014-10-18: qty 1

## 2014-10-18 MED ORDER — ATORVASTATIN CALCIUM 20 MG PO TABS: 20.0000 mg | ORAL_TABLET | Freq: Every day | ORAL | Status: DC

## 2014-10-18 MED ORDER — HYDROCODONE-ACETAMINOPHEN 5-325 MG PO TABS: 1.0000 | ORAL_TABLET | Freq: Four times a day (QID) | ORAL | Status: DC | PRN

## 2014-10-18 NOTE — Clinical Social Work Placement (Signed)
   CLINICAL SOCIAL WORK PLACEMENT  NOTE  Date:  10/18/2014  Patient Details  Name: Paul [name] MRN: [id number] Date of Birth: Jan 21, 1931  Clinical Social Work is seeking post-discharge placement for this patient at the [place] level of care (*CSW will initial, date and re-position this form in  chart as items are completed):  Yes   Patient/family provided with [facility] Work Department's list of facilities offering this level of care within the geographic area requested by the patient (or if unable, by the patient's family).  Yes   Patient/family informed of their freedom to choose among providers that offer the needed level of care, that participate in Medicare, Medicaid or managed care program needed by the patient, have an available bed and are willing to accept the patient.  Yes   Patient/family informed of [facility]'s ownership interest in [facility] and [facility], as well as of the fact that they are under no obligation to receive care at these facilities.  PASRR submitted to EDS on       PASRR number received on       Existing PASRR number confirmed on 10/18/14     FL2 transmitted to all facilities in geographic area requested by pt/family on 10/18/14     FL2 transmitted to all facilities within larger geographic area on       Patient informed that his/her managed care company has contracts with or will negotiate with certain facilities, including the following:        Yes   Patient/family informed of bed offers received.  Patient chooses bed at  [facility] and [place] )     Physician recommends and patient chooses bed at      Patient to be transferred to  [facility] ) on 10/18/14.  Patient to be transferred to facility by  [facility] )     Patient family notified on 10/18/14 of transfer.  Name of family member notified:   (Pt's family members, Prudencio Pair, and [name] )     PHYSICIAN Please sign FL2      Additional Comment:    _______________________________________________ Glendon Axe, MSW, LCSWA [phone] 10/18/2014 12:04 PM

## 2014-10-18 NOTE — Clinical Social Work Note (Signed)
Clinical Social Work Assessment  Patient Details  Name: Paul [name] MRN: [id number] Date of Birth: 1930/05/15  Date of referral:  10/18/14               Reason for consult:    SNF placement                Permission sought to share information with:  Family Supports Permission granted to share information::     Name::     Paul [name]  Relationship::  Niece   Contact Information:   [phone] home, cell [phone]   Housing/Transportation Living arrangements for the past 2 months:  Apartment Source of Information:  Other (Comment Required) (From H&P) Patient Interpreter Needed:  None Criminal Activity/Legal Involvement Pertinent to Current Situation/Hospitalization:  No - Comment as needed Significant Relationships:  Other Family Members, Friend Lives with:  Self Do you feel safe going back to the place where you live?  No Need for family participation in patient care:  Yes (Comment)  Care giving concerns:  N/A  Facilities manager / plan:  CSW informed by MD that the pt's friend's Paul [name] was seeking help finding the pt's family. Paul [name] reported that the pts has not been involved with his family in years. Paul [name] shared that she prefers the pt to go to [employer]. CSW was able to locate two nieces Paul [name] [phone] and Paul [name] [phone] home, cell [phone].  CSW spoke with [facility] who informed the CSW that Paul [name] has a closer relationship with the pt. CSW placed a call to [facility]. Paul [name] shared that she was not aware that the pt was admitted. Paul [name] shared that the pt has children a son and a daughter. Paul [name] reported not knowing the pt's children located. Paul [name] reported that due to the pt's past, he is estranged from his children. CSW and Paul [name] dicussed SNF placement. Paul [name] reported that she will talk to [facility] regarding placement.   Employment status:  Retired Forensic scientist:  Medicare PT Recommendations:  [place] / Referral to community resources:   [place]  Patient/Family's Response to care:  Paul [name] reported that the care in which the pt has received has been well.   Patient/Family's Understanding of and Emotional Response to Diagnosis, Current Treatment, and Prognosis: Paul [name](friend)and Paul [name](niece)reported not knowing a great deal about the pt's condition. Paul [name] and Paul [name] expressed wanting what is best for the pt.   Emotional Assessment Appearance:  Appears stated age Attitude/Demeanor/Rapport:  Unable to Assess Affect (typically observed):  Unable to Assess Orientation:  Oriented to Self Alcohol / Substance use:  Illicit Drugs Psych involvement (Current and /or in the community):  No (Comment)  Discharge Needs  Concerns to be addressed:  Denies Needs/Concerns at this time Readmission within the last 30 days:  Yes Current discharge risk:  Lives alone Barriers to Discharge:  No Barriers Identified   Paul Chern, LCSW 10/18/2014, 10:17 AM

## 2014-10-18 NOTE — Discharge Summary (Signed)
[facility]  Physician Discharge Summary   Patient ID: Paul [name] MRN: [id number] DOB/AGE: September 18, 1930 81 y.o.  Admit date: 10/13/2014 Discharge date: 10/18/2014  PCP: Smothers, Andree Elk, NP  DISCHARGE DIAGNOSES:  Principal Problem:   CVA (cerebral infarction) Active Problems:   TOBACCO ABUSE   Essential hypertension   Acute thromboembolism of deep veins of lower extremity   GASTROESOPHAGEAL REFLUX DISEASE   Falls   CAP (community acquired pneumonia)   Shortness of breath   FTT (failure to thrive) in adult   Lung cancer   Protein-calorie malnutrition, severe   Cavitary pneumonia   Complex partial seizure   Stroke with cerebral ischemia   Palliative care encounter   Stroke   RECOMMENDATIONS FOR OUTPATIENT FOLLOW UP: 1. CBC and basic metabolic panel in 1 week 2. Patient to follow-up with his oncologist, Dr. Julien Nordmann in about 4-6 weeks.  DISCHARGE CONDITION: fair  Diet recommendation: Dysphagia 3 diet with nectar thick liquids  Filed Weights   10/16/14 0500 10/17/14 0456 10/18/14 0500  Weight: 48.081 kg (106 lb) 46.72 kg (103 lb) 49.442 kg (109 lb)    INITIAL HISTORY: 81 year old Caucasian male with a past medical history of hypertension, GERD, tobacco abuse, COPD, history of complex partial seizures, non-small cell lung cancer, previous history of stroke, presented with fall and possible syncope. On examination, he was found to have left-sided weakness. MRI showed a right-sided stroke. Of note, patient was discharged from [facility] on September 22 after being managed for pneumonia.  Consultants: Neurology, palliative medicine, psychiatry  Procedures:  2-D echocardiogram Study Conclusions - Left ventricle: The cavity size was normal. Systolic function wasnormal. The estimated ejection fraction was in the range of 55%to 60%. Wall motion was normal; there were no regional wallmotion abnormalities. Doppler parameters are consistent  withabnormal left ventricular relaxation (grade 1 diastolicdysfunction). There was no evidence of elevated ventricularfilling pressure by Doppler parameters. - Aortic valve: Trileaflet; normal thickness leaflets. There was noregurgitation. - Aortic root: The aortic root was normal in size. - Mitral valve: There was mild regurgitation. - Left atrium: The atrium was normal in size. - Right ventricle: Systolic function was normal. - Right atrium: The atrium was normal in size. - Tricuspid valve: There was no regurgitation. - Pulmonary arteries: Systolic pressure was within the normalrange. - Inferior vena cava: The vessel was normal in size. - Pericardium, extracardiac: There was no pericardial effusion. Impressions: - This is a limited echocardiogram with only subcostal images.There appears to be normal chamber size, normal biventricularsystolic function.There is abnormal relaxation with normal filling pressures.Mild mitral regurgitation.Normal RVSP.   HOSPITAL COURSE:   Acute stroke/previous history of embolic stroke Evaluation has detected diffuse cerebrovascular disease, especially of the right common carotid artery and internal carotid artery. Neurology has recommended dual antiplatelets treatment. Due to noncompliance and multiple falls, he is not a candidate for anticoagulation. Palliative medicine was consulted to determine patient's goals of care. Unfortunately no immediate family is available. Attempts are ongoing to locate his daughter. PT and OT recommends SNF. Patient agreeable to go to one. Patient on aspirin and Plavix. Echocardiogram as above. LDL is 82. Patient is on a statin.HbA1c is 6.1. Patient has been seen by psychiatry and it has been determined that he lacks capacity. Palliative medicine discussions can be continued at skilled nursing facility. Was initially started on Seroquel for mood stabilization. Changed to Zyprexa by palliative medicine.  Fall Possibly due to  the above. PT and OT has seen the patient. SNF has been  recommended.  History of Complex partial seizures Patient's Dilantin level was initially subtherapeutic. He was loaded with Dilantin. Dilantin level is 12. Albumen is 2.0. Corrected level is 24. No changes to his dosage. Continue current dose of Dilantin.  Recent community-acquired pneumonia with possible cavitary lesion Continue Augmentin. Plan was to continue for 3 weeks from last hospitalization. He has 2 more weeks of treatment left. CT angiogram of chest was done. No PE was noticed.  History of essential hypertension Does not appear to be on any home medications for same. Blood pressure was high during initial part of this hospitalization, which could've been due to agitation. Blood pressure is now stable and well controlled.  History of lung cancer Patient to follow-up with his oncologist Dr. Julien Nordmann. MRI brain did not show any metastases. CT angiogram of the chest did not show any obvious recurrence of his cancer.  Severe protein calorie malnutrition Continue ensure  Hypokalemia and hyponatremia Potassium repleted. Magnesium is normal.  Overall, patient is stable. He has periods of agitation, but for the most part has been reasonably well behaved. Okay for discharge to skilled nursing facility. Social worker is coordinating with family.   PERTINENT LABS:  The results of significant diagnostics from this hospitalization (including imaging, microbiology, ancillary and laboratory) are listed below for reference.    Microbiology: Recent Results (from the past 240 hour(s))  Culture, blood (routine x 2)     Status: None   Collection Time: 10/10/14  1:14 PM  Result Value Ref Range Status   Specimen Description BLOOD LEFT ANTECUBITAL  Final   Special Requests BOTTLES DRAWN AEROBIC AND ANAEROBIC 5.5CC  Final   Culture   Final    NO GROWTH 5 DAYS Performed at [facility]    Report Status 10/15/2014 FINAL  Final    Culture, blood (x 2)     Status: None (Preliminary result)   Collection Time: 10/13/14 11:24 PM  Result Value Ref Range Status   Specimen Description BLOOD LEFT HAND  Final   Special Requests BOTTLES DRAWN AEROBIC AND ANAEROBIC 5CC  Final   Culture NO GROWTH 3 DAYS  Final   Report Status PENDING  Incomplete  Culture, blood (x 2)     Status: None (Preliminary result)   Collection Time: 10/13/14 11:43 PM  Result Value Ref Range Status   Specimen Description BLOOD RIGHT ARM  Final   Special Requests BOTTLES DRAWN AEROBIC AND ANAEROBIC 5CC  Final   Culture NO GROWTH 3 DAYS  Final   Report Status PENDING  Incomplete     Labs: Basic Metabolic Panel:  Recent Labs Lab 10/12/14 0515 10/13/14 2000 10/14/14 0329 10/15/14 0550 10/15/14 0941 10/16/14 0613  NA 130* 134* 131* 130*  --  132*  K 4.0 4.2 4.1 3.2*  --  5.0  CL 96* 100* 94* 91*  --  101  CO2 '26 28 29 27  '$ --  22  GLUCOSE 106* 123* 112* 75  --  92  BUN '10 9 6 8  '$ --  9  CREATININE 0.40* 0.49* 0.60* 0.73  --  0.52*  CALCIUM 8.4* 8.5* 8.6* 8.5*  --  8.1*  MG  --   --   --   --  2.0  --    Liver Function Tests:  Recent Labs Lab 10/13/14 2000  AST 23  ALT 16*  ALKPHOS 97  BILITOT 0.5  PROT 5.7*  ALBUMIN 2.0*   CBC:  Recent Labs Lab 10/12/14 0515 10/13/14 2000 10/14/14  0329 10/15/14 0550  WBC 7.9 12.6* 9.9 6.6  NEUTROABS  --  11.0*  --   --   HGB 11.2* 11.4* 12.5* 13.5  HCT 34.4* 35.2* 39.6 41.5  MCV 81.9 80.9 82.5 81.7  PLT 354 346 354 398   BNP: BNP (last 3 results)  Recent Labs  10/14/14 0329  BNP 317.4*    CBG:  Recent Labs Lab 10/14/14 0639 10/15/14 0752 10/16/14 0725 10/17/14 0636 10/18/14 0709  GLUCAP 132* 89 104* 129* 109*     IMAGING STUDIES Ct Angio Head W/cm &/or Wo Cm  10/15/2014   ADDENDUM REPORT: 10/15/2014 09:51 ADDENDUM: Study discussed by telephone with Dr. Rosalin Hawking on 10/15/2014 at 0950 hours. There should also be an additional Impression regarding the left carotid  system which reads: - High-grade stenosis left ICA siphon, and distal left ICA bulb stenosis 65%. Electronically Signed   By: Genevie Ann M.D.   On: 10/15/2014 09:51  10/15/2014   CLINICAL DATA:  81 year old male with frontal headache. Syncope. Left arm weakness. Patchy acute right MCA territory infarcts diagnosed on recent brain MRI. Initial encounter.  EXAM: CT ANGIOGRAPHY HEAD AND NECK  TECHNIQUE: Multidetector CT imaging of the head and neck was performed using the standard protocol during bolus administration of intravenous contrast. Multiplanar CT image reconstructions and MIPs were obtained to evaluate the vascular anatomy. Carotid stenosis measurements (when applicable) are obtained utilizing NASCET criteria, using the distal internal carotid diameter as the denominator.  CONTRAST:  44m OMNIPAQUE IOHEXOL 350 MG/ML SOLN  COMPARISON:  Brain MRI without contrast 10/14/2014, and earlier. Chest CT 10/14/2014.  FINDINGS: CT HEAD  Brain: Mild cortical and white matter hypodensity corresponding to the patchy superior right hemisphere infarcts seen on 10/14/2014 (series 6, image 27). No hemorrhagic transformation or mass effect. Right occipital pole hypodensity also appears increased (image 16). Elsewhere stable gray-white matter differentiation.  No acute intracranial hemorrhage identified. No ventriculomegaly. No intracranial mass effect.  Calvarium and skull base:  No acute osseous abnormality identified.  Paranasal sinuses: Right maxillary sinus fluid level and bubbly opacity. Right mastoid effusion.  Orbits: Visualized orbits and scalp soft tissues are within normal limits.  CTA NECK  Skeleton: Advanced degenerative changes in the left cervical spine. T5 compression fracture is stable from the recent chest CT. Osteopenia. Chronic right clavicle fracture. No suspicious osseous lesion in the neck.  Other neck: Increased layering secretions in the distal trachea and mainstem bronchi (series 8, image 1). Stable  abnormal lung apices.  Thyroid, larynx, pharynx, parapharyngeal spaces, retropharyngeal space, sublingual space, submandibular glands, and parotid glands are within normal limits for age. No cervical lymphadenopathy.  Aortic arch: 3 vessel arch configuration with moderate soft and calcified plaque affecting the arch.  Right carotid system: Radiographic string sign stenosis right CCA 2 cm beyond its origin (series 8, image 34 and series 10, image 125) with confluent surrounding soft plaque or thrombus. Despite this the right CCA is patent. Soft and calcified plaque at the right carotid bifurcation results in up to 50 % stenosis with respect to the distal vessel. Otherwise negative cervical right ICA.  Left carotid system: Medial soft plaque in the proximal left CCA resulting in less than 50 % stenosis with respect to the distal vessel (series 8, image 24). Bulky calcified plaque at the left carotid bifurcation. This continues into the left ICA bulb. Stenosis at the distal bulb level up to 65 % stenosis with respect to the distal vessel (series 10, image 100). Otherwise negative  cervical left ICA.  Vertebral arteries:  Tortuous proximal right subclavian artery with a kinked appearance plus superimposed soft and calcified plaque. Subsequent up to 50 % stenosis with respect to the distal vessel. Right vertebral artery origin is occluded (series 8, image 34). Right V1 segment is occluded. Reconstituted flow in the V2 segment at the C3-C4 level from muscular collaterals. However, the distal right vertebral artery tapers to the skullbase but does remain patent.  Soft and calcified plaque in the proximal left subclavian artery. Ulcerated plaque on series 8, image 34 just proximal to the left vertebral artery origin. Less than 50% stenosis of the proximal left subclavian. Soft and calcified plaque at the left vertebral artery origin but only mild subsequent stenosis. Tortuous left V1 segment. No left V2 or V3 stenosis.  CTA  HEAD  Posterior circulation: Diminutive distal right vertebral artery is patent to the vertebrobasilar junction. Dominant right AICA. No distal left vertebral artery stenosis, in that vessel primarily supplies the basilar. Patent left AICA origin. No basilar artery stenosis. Normal SCA and left PCA origins. Fetal type right PCA origin. Left posterior communicating artery is diminutive. Bilateral PCA branches are patent and mildly irregular.  Anterior circulation: Severe calcified plaque of both ICA siphons resulting in high-grade vertical peach wrists segment stenosis worse on the right. Despite this both ICA siphons remain patent. Tandem high-grade stenosis at the right anterior genu from additional calcified plaque. Both carotid termini are patent. Ophthalmic and posterior communicating artery origins are normal.  Normal MCA and ACA origins. Diminutive anterior communicating artery. Bilateral ACA branches are within normal limits. Left MCA M1 segment, bifurcation, and branches are within normal limits.  Right MCA origin and M1 segment are patent. Right MCA bifurcation is patent. No right MCA branch occlusion identified.  Venous sinuses: Patent.  Anatomic variants: None.  Delayed phase: No abnormal enhancement identified.  IMPRESSION: 1. Bulky soft plaque or thrombus in the proximal right CCA with RADIOGRAPHIC STRING SIGN stenosis, 2 cm from the arch. Additional plaque at the right carotid bifurcation is not hemodynamically significant. However, there are tandem high-grade stenoses also in the right ICA siphon related to bulky calcified plaque. 2. No right MCA stenosis or branch occlusion identified. 3. Occluded right vertebral artery origin with intermediate reconstitution in the distal V2 segment, but diminutive intracranial right vertebral artery. Left vertebral artery primarily supplies the basilar with no significant stenosis. 4. Fairly extensive soft atherosclerotic plaque. Multiple areas of ulcerated plaque  at the arch and proximal great vessels. 5. Expected evolution of patchy right MCA infarcts with no hemorrhagic transformation or intracranial mass effect. 6. Stable abnormal visible right lung. Interval increased retained secretions in the distal trachea and mainstem bronchi.  Electronically Signed: By: Genevie Ann M.D. On: 10/15/2014 09:36   Dg Forearm Left  10/13/2014   CLINICAL DATA:  Fall with multiple skin tears midshaft, limited range of motion. Limited ability to cooperate.  EXAM: LEFT FOREARM - 2 VIEW  COMPARISON:  None.  FINDINGS: There is no evidence of fracture or other focal bone lesions. Soft tissues are unremarkable.  IMPRESSION: Negative.   Electronically Signed   By: Nolon Nations M.D.   On: 10/13/2014 19:24   Ct Head Wo Contrast  10/14/2014   CLINICAL DATA:  Code stroke. Altered mental status. Right-sided gaze. Recent contrast-enhanced CT.  EXAM: CT HEAD WITHOUT CONTRAST  TECHNIQUE: Contiguous axial images were obtained from the base of the skull through the vertex without intravenous contrast.  COMPARISON:  MRI brain  10/14/2014.  CT head 10/13/2014.  FINDINGS: Diffuse cerebral atrophy. Ventricular dilatation consistent with central atrophy. Low-attenuation changes throughout the deep white matter consistent with small vessel ischemia. No change in parenchymal pattern since previous study. No mass effect or midline shift. No abnormal extra-axial fluid collections. Gray-white matter junctions are distinct. Basal cisterns are not effaced. No evidence of acute intracranial hemorrhage. No depressed skull fractures. Mucosal thickening in the right maxillary antrum.  IMPRESSION: No acute intracranial abnormalities. Chronic atrophy and small vessel ischemic changes diffusely.  These results were called by telephone at the time of interpretation on 10/14/2014 at 6:27 am to Dr. Clance Boll , who verbally acknowledged these results.   Electronically Signed   By: Lucienne Capers M.D.   On:  10/14/2014 06:28   Ct Head Wo Contrast  10/13/2014   CLINICAL DATA:  Unwitnessed fall.  EXAM: CT HEAD WITHOUT CONTRAST  TECHNIQUE: Contiguous axial images were obtained from the base of the skull through the vertex without intravenous contrast.  COMPARISON:  07/22/2014.  FINDINGS: Diffusely enlarged ventricles and subarachnoid spaces. Patchy white matter low density in both cerebral hemispheres. No skull fracture or intracranial hemorrhage. Right maxillary sinus air-fluid level and mild mucosal thickening. The mucosal thickening and significantly improved. Progressive right mastoid opacification.  IMPRESSION: 1. No skull fracture or intracranial hemorrhage. 2. Stable atrophy and chronic small vessel white matter ischemic changes. 3. Improved right maxillary sinusitis with an acute component today. 4. Mildly progressive right mastoid opacification.   Electronically Signed   By: Claudie Revering M.D.   On: 10/13/2014 19:48   Ct Angio Neck W/cm &/or Wo/cm  10/15/2014   ADDENDUM REPORT: 10/15/2014 09:51 ADDENDUM: Study discussed by telephone with Dr. Rosalin Hawking on 10/15/2014 at 0950 hours. There should also be an additional Impression regarding the left carotid system which reads: - High-grade stenosis left ICA siphon, and distal left ICA bulb stenosis 65%. Electronically Signed   By: Genevie Ann M.D.   On: 10/15/2014 09:51  10/15/2014   CLINICAL DATA:  81 year old male with frontal headache. Syncope. Left arm weakness. Patchy acute right MCA territory infarcts diagnosed on recent brain MRI. Initial encounter.  EXAM: CT ANGIOGRAPHY HEAD AND NECK  TECHNIQUE: Multidetector CT imaging of the head and neck was performed using the standard protocol during bolus administration of intravenous contrast. Multiplanar CT image reconstructions and MIPs were obtained to evaluate the vascular anatomy. Carotid stenosis measurements (when applicable) are obtained utilizing NASCET criteria, using the distal internal carotid diameter as  the denominator.  CONTRAST:  35m OMNIPAQUE IOHEXOL 350 MG/ML SOLN  COMPARISON:  Brain MRI without contrast 10/14/2014, and earlier. Chest CT 10/14/2014.  FINDINGS: CT HEAD  Brain: Mild cortical and white matter hypodensity corresponding to the patchy superior right hemisphere infarcts seen on 10/14/2014 (series 6, image 27). No hemorrhagic transformation or mass effect. Right occipital pole hypodensity also appears increased (image 16). Elsewhere stable gray-white matter differentiation.  No acute intracranial hemorrhage identified. No ventriculomegaly. No intracranial mass effect.  Calvarium and skull base:  No acute osseous abnormality identified.  Paranasal sinuses: Right maxillary sinus fluid level and bubbly opacity. Right mastoid effusion.  Orbits: Visualized orbits and scalp soft tissues are within normal limits.  CTA NECK  Skeleton: Advanced degenerative changes in the left cervical spine. T5 compression fracture is stable from the recent chest CT. Osteopenia. Chronic right clavicle fracture. No suspicious osseous lesion in the neck.  Other neck: Increased layering secretions in the distal trachea and mainstem bronchi (series 8,  image 1). Stable abnormal lung apices.  Thyroid, larynx, pharynx, parapharyngeal spaces, retropharyngeal space, sublingual space, submandibular glands, and parotid glands are within normal limits for age. No cervical lymphadenopathy.  Aortic arch: 3 vessel arch configuration with moderate soft and calcified plaque affecting the arch.  Right carotid system: Radiographic string sign stenosis right CCA 2 cm beyond its origin (series 8, image 34 and series 10, image 125) with confluent surrounding soft plaque or thrombus. Despite this the right CCA is patent. Soft and calcified plaque at the right carotid bifurcation results in up to 50 % stenosis with respect to the distal vessel. Otherwise negative cervical right ICA.  Left carotid system: Medial soft plaque in the proximal left CCA  resulting in less than 50 % stenosis with respect to the distal vessel (series 8, image 24). Bulky calcified plaque at the left carotid bifurcation. This continues into the left ICA bulb. Stenosis at the distal bulb level up to 65 % stenosis with respect to the distal vessel (series 10, image 100). Otherwise negative cervical left ICA.  Vertebral arteries:  Tortuous proximal right subclavian artery with a kinked appearance plus superimposed soft and calcified plaque. Subsequent up to 50 % stenosis with respect to the distal vessel. Right vertebral artery origin is occluded (series 8, image 34). Right V1 segment is occluded. Reconstituted flow in the V2 segment at the C3-C4 level from muscular collaterals. However, the distal right vertebral artery tapers to the skullbase but does remain patent.  Soft and calcified plaque in the proximal left subclavian artery. Ulcerated plaque on series 8, image 34 just proximal to the left vertebral artery origin. Less than 50% stenosis of the proximal left subclavian. Soft and calcified plaque at the left vertebral artery origin but only mild subsequent stenosis. Tortuous left V1 segment. No left V2 or V3 stenosis.  CTA HEAD  Posterior circulation: Diminutive distal right vertebral artery is patent to the vertebrobasilar junction. Dominant right AICA. No distal left vertebral artery stenosis, in that vessel primarily supplies the basilar. Patent left AICA origin. No basilar artery stenosis. Normal SCA and left PCA origins. Fetal type right PCA origin. Left posterior communicating artery is diminutive. Bilateral PCA branches are patent and mildly irregular.  Anterior circulation: Severe calcified plaque of both ICA siphons resulting in high-grade vertical peach wrists segment stenosis worse on the right. Despite this both ICA siphons remain patent. Tandem high-grade stenosis at the right anterior genu from additional calcified plaque. Both carotid termini are patent. Ophthalmic and  posterior communicating artery origins are normal.  Normal MCA and ACA origins. Diminutive anterior communicating artery. Bilateral ACA branches are within normal limits. Left MCA M1 segment, bifurcation, and branches are within normal limits.  Right MCA origin and M1 segment are patent. Right MCA bifurcation is patent. No right MCA branch occlusion identified.  Venous sinuses: Patent.  Anatomic variants: None.  Delayed phase: No abnormal enhancement identified.  IMPRESSION: 1. Bulky soft plaque or thrombus in the proximal right CCA with RADIOGRAPHIC STRING SIGN stenosis, 2 cm from the arch. Additional plaque at the right carotid bifurcation is not hemodynamically significant. However, there are tandem high-grade stenoses also in the right ICA siphon related to bulky calcified plaque. 2. No right MCA stenosis or branch occlusion identified. 3. Occluded right vertebral artery origin with intermediate reconstitution in the distal V2 segment, but diminutive intracranial right vertebral artery. Left vertebral artery primarily supplies the basilar with no significant stenosis. 4. Fairly extensive soft atherosclerotic plaque. Multiple areas of ulcerated  plaque at the arch and proximal great vessels. 5. Expected evolution of patchy right MCA infarcts with no hemorrhagic transformation or intracranial mass effect. 6. Stable abnormal visible right lung. Interval increased retained secretions in the distal trachea and mainstem bronchi.  Electronically Signed: By: Genevie Ann M.D. On: 10/15/2014 09:36   Ct Angio Chest Pe W/cm &/or Wo Cm  10/14/2014   CLINICAL DATA:  Shortness of breath, syncope, and tachycardia. History of DVT.  EXAM: CT ANGIOGRAPHY CHEST WITH CONTRAST  TECHNIQUE: Multidetector CT imaging of the chest was performed using the standard protocol during bolus administration of intravenous contrast. Multiplanar CT image reconstructions and MIPs were obtained to evaluate the vascular anatomy.  CONTRAST:  26m  OMNIPAQUE IOHEXOL 350 MG/ML SOLN  COMPARISON:  10/10/2014  FINDINGS: Technically adequate study with good opacification of the central and segmental pulmonary arteries. No focal filling defects. No evidence of significant pulmonary embolus.  Normal heart size. Normal caliber thoracic aorta. Eccentric noncalcified plaque formation in the lower descending aorta. No aortic dissection. Calcification in the aorta. Great vessel origins are patent. Focal stenosis of the proximal right carotid artery representing about 75% diameter reduction focally. Distal flow is demonstrated. Esophagus is decompressed. Moderate-sized esophageal hiatal hernia. No significant lymphadenopathy in the chest.  Diffuse emphysematous changes throughout both lungs. Consolidation in the right upper lung with air bronchograms possibly representing changes related to known lung cancer. This could represent postobstructive change or post treatment change. Patchy airspace infiltrates in the right apex may be inflammatory consolidation or atelectasis in the right lung base is progressing since prior study. Loculated right pleural fluid and gas collections anteriorly and in the base. These were present previously and could be due to infection or post treatment changes or perhaps less likely pleural metastasis. Mucoid material demonstrated in bilateral lower lobe bronchi. Area of cavitation versus emphysematous bulla with small air-fluid level in the left lung base is similar to prior study.  Included portions of the upper abdominal organs are grossly unremarkable. Or post obstructive enter similar to prior study.  Review of the MIP images confirms the above findings.  IMPRESSION: No evidence of significant pulmonary embolus. Diffuse emphysematous changes throughout the lungs. Areas of consolidation in the right upper lung and right lung base probably related to history of lung cancer. Changes may represent postobstructive consolidation or treatment  changes. Patchy airspace disease in the right apex. Loculated gas and fluid collections in the right pleural space. Mucous plugging.   Electronically Signed   By: WLucienne CapersM.D.   On: 10/14/2014 02:45   Mr Brain Wo Contrast  10/14/2014   CLINICAL DATA:  Initial evaluation for acute syncope, fall. History of probable recurrent lung cancer.  EXAM: MRI HEAD WITHOUT CONTRAST  TECHNIQUE: Multiplanar, multiecho pulse sequences of the brain and surrounding structures were obtained without intravenous contrast.  COMPARISON:  Prior CT from 10/13/2014 as well as earlier studies.  FINDINGS: Generalized cerebral volume loss with chronic microvascular ischemic changes again seen. Remote cortical infarct within the right occipital lobe.  Multiple patchy cortical, subcortical, and periventricular ischemic infarcts seen involving the right frontal, parietal, and occipital lobes in a fairly linear watershed distribution. The largest of these foci involves the cortical gray matter in the anterior right frontal lobe (series 3, image 38). No associated hemorrhage or mass effect. Diminished flow void within the distal right vertebral artery, which may be related to slow flow or occlusion. Major intracranial flow voids otherwise maintained.  No acute or chronic intracranial  hemorrhage. No mass lesion, midline shift, or mass effect. Ventricular prominence related to global parenchymal volume loss present without hydrocephalus. No extra-axial fluid collection.  Craniocervical junction within normal limits. Pituitary gland normal.  No acute abnormality about the orbits.  Moderate mucosal thickening within the right maxillary sinus. Paranasal sinuses are otherwise clear. Right mastoid effusion noted. Inner ear structures normal.  Bone marrow signal intensity within normal limits. Scalp soft tissues unremarkable.  IMPRESSION: 1. Patchy multi focal ischemic infarcts involving the right frontal, parietal, and occipital lobes, largely  in a watershed type distribution. No associated hemorrhage or mass effect. 2. Abnormal flow void within the distal right vertebral artery, which may be related to slow flow or possibly occlusion. Otherwise preserved major intracranial flow voids. 3. Advanced cerebral atrophy with moderate chronic microvascular ischemic changes.   Electronically Signed   By: Jeannine Boga M.D.   On: 10/14/2014 01:20   Mr Jeri Cos Contrast  10/14/2014   CLINICAL DATA:  81 year old male with syncope. History of lung cancer, unclear recurrence/disease status at this time. Subsequent encounter.  EXAM: MRI HEAD WITH CONTRAST  TECHNIQUE: Multiplanar, multiecho pulse sequences of the brain and surrounding structures were obtained with intravenous contrast.  COMPARISON:  Brain MRI without contrast 0004 hr today, and earlier.  CONTRAST:  78m MULTIHANCE GADOBENATE DIMEGLUMINE 529 MG/ML IV SOLN  FINDINGS: No abnormal enhancement or brain mass identified. No post ischemic enhancement related to the right hemisphere findings reported earlier today. No dural thickening identified. No abnormal enhancement identified in the visualized cervical spine. No suspicious calvarium enhancement identified. Stable cerebral morphology, no intracranial mass effect.  IMPRESSION: No evidence of metastatic disease to the brain.   Electronically Signed   By: HGenevie AnnM.D.   On: 10/14/2014 21:32   Dg Chest Port 1 View  10/13/2014   CLINICAL DATA:  Sepsis. History of hypertension, COPD, lung cancer, stroke, emphysema, and pneumonia.  EXAM: PORTABLE CHEST 1 VIEW  COMPARISON:  10/11/2014  FINDINGS: Normal heart size and pulmonary vascularity. Volume loss and scarring in the right lung with areas of probable cavitation and consolidation demonstrated in the right upper lung and right base. This is probably related to the patient's history of lung cancer and may represent residual mass lesions, post treatment changes, and/or postobstructive consolidation.  Diffuse emphysematous changes and scattered fibrosis in both lungs. No acute change since the prior study.  IMPRESSION: Chronic changes in the right lung and diffuse emphysematous changes with fibrosis in both lungs. No acute change since previous study.   Electronically Signed   By: WLucienne CapersM.D.   On: 10/13/2014 23:47     DISCHARGE EXAMINATION: Filed Vitals:   10/18/14 0158 10/18/14 0500 10/18/14 0650 10/18/14 1000  BP: 138/67  108/50 108/47  Pulse: 84  90 105  Temp: 97.7 F (36.5 C)  97.7 F (36.5 C) 98 F (36.7 C)  TempSrc: Axillary  Oral Oral  Resp: '16  16 20  '$ Weight:  49.442 kg (109 lb)    SpO2: 96%  95% 99%   General appearance: alert, cooperative, appears stated age and no distress Resp: coarse breath sounds bilaterally. No wheezing, rales or rhonchi. Cardio: regular rate and rhythm, S1, S2 normal, no murmur, click, rub or gallop GI: soft, non-tender; bowel sounds normal; no masses,  no organomegaly Extremities: extremities normal, atraumatic, no cyanosis or edema Neurologic: left hemiparesis. Speech has improved. No facial asymmetry.  DISPOSITION: SNF  Discharge Instructions    Call MD for:  difficulty breathing,  headache or visual disturbances    Complete by:  As directed      Call MD for:  extreme fatigue    Complete by:  As directed      Call MD for:  persistant dizziness or light-headedness    Complete by:  As directed      Call MD for:  persistant nausea and vomiting    Complete by:  As directed      Call MD for:  severe uncontrolled pain    Complete by:  As directed      Call MD for:  temperature >100.4    Complete by:  As directed      Discharge diet:    Complete by:  As directed   Dysphagia 3 Diet with Nectar Thick Liquids     Discharge instructions    Complete by:  As directed   CBC and Bmet in 1 week.  You were cared for by a hospitalist during your hospital stay. If you have any questions about your discharge medications or the care you  received while you were in the hospital after you are discharged, you can call the unit and asked to speak with the hospitalist on call if the hospitalist that took care of you is not available. Once you are discharged, your primary care physician will handle any further medical issues. Please note that NO REFILLS for any discharge medications will be authorized once you are discharged, as it is imperative that you return to your primary care physician (or establish a relationship with a primary care physician if you do not have one) for your aftercare needs so that they can reassess your need for medications and monitor your lab values. If you do not have a primary care physician, you can call [phone] for a physician referral.     Increase activity slowly    Complete by:  As directed            ALLERGIES: No Known Allergies   Current Discharge Medication List    START taking these medications   Details  !! acetaminophen (TYLENOL) 325 MG tablet Take 2 tablets (650 mg total) by mouth every 6 (six) hours as needed for mild pain (or Fever >/= 101).    !! acetaminophen (TYLENOL) 325 MG tablet Take 2 tablets (650 mg total) by mouth 3 (three) times daily.    aspirin EC 81 MG EC tablet Take 1 tablet (81 mg total) by mouth daily.    atorvastatin (LIPITOR) 20 MG tablet Take 1 tablet (20 mg total) by mouth daily at 6 PM.    clopidogrel (PLAVIX) 75 MG tablet Take 1 tablet (75 mg total) by mouth daily.    OLANZapine (ZYPREXA) 5 MG tablet Take 1 tablet (5 mg total) by mouth at bedtime.    oxyCODONE (OXY IR/ROXICODONE) 5 MG immediate release tablet Take 0.5 tablets (2.5 mg total) by mouth 3 (three) times daily. Qty: 30 tablet, Refills: 0    phenytoin (DILANTIN) 300 MG ER capsule Take 1 capsule (300 mg total) by mouth daily.    pregabalin (LYRICA) 25 MG capsule Take 1 capsule (25 mg total) by mouth 2 (two) times daily.     !! - Potential duplicate medications found. Please discuss with provider.      CONTINUE these medications which have CHANGED   Details  amoxicillin-clavulanate (AUGMENTIN) 875-125 MG per tablet Take 1 tablet by mouth every 12 (twelve) hours. For 15 more days Qty: 41 tablet, Refills: 0  feeding supplement, ENSURE ENLIVE, (ENSURE ENLIVE) LIQD Take 237 mLs by mouth 3 (three) times daily between meals. Qty: 237 mL, Refills: 12    ipratropium-albuterol (DUONEB) 0.5-2.5 (3) MG/3ML SOLN Take 3 mLs by nebulization every 4 (four) hours as needed (wheezing). Qty: 360 mL    nicotine (NICODERM CQ - DOSED IN MG/24 HOURS) 21 mg/24hr patch Place 1 patch (21 mg total) onto the skin daily. Qty: 28 patch, Refills: 0      CONTINUE these medications which have NOT CHANGED   Details  Fluticasone-Salmeterol (ADVAIR) 250-50 MCG/DOSE AEPB Inhale 1 puff into the lungs 2 (two) times daily as needed (shortness of breath).     PROAIR HFA 108 (90 BASE) MCG/ACT inhaler INHALE 2 PUFFS EVERY 4-6 HOURS AS NEEDED FOR COUGH OR WHEEZING Refills: 3      STOP taking these medications     predniSONE (DELTASONE) 10 MG tablet      gabapentin (NEURONTIN) 300 MG capsule        Follow-up Information    Follow up with Xu,Jindong, MD. Schedule an appointment as soon as possible for a visit in 2 months.   Specialty:  Neurology   Why:  stroke clinic   Contact information:   [place] [address] [phone]       Follow up with Smothers, Andree Elk, NP. Schedule an appointment as soon as possible for a visit in 1 week.   Specialty:  Nurse Practitioner   Why:  post hospitalization follow up   Contact information:   [address] [place] [address] [phone]       TOTAL DISCHARGE TIME: 47 minutes  [facility] Pager [phone]  10/18/2014, 11:18 AM

## 2014-10-18 NOTE — Progress Notes (Signed)
Patient discharged to SNF. Report given to receiving nurse. Patient's dressing changed prior to discharge, patient's skin tears to bilateral covered with foam dressings. Patient aware of transfer. Patient had several loose stools. MD made aware, no new orders. Neuro assessment unchanged, NIHHS unchanged.

## 2014-10-18 NOTE — Clinical Social Work Note (Addendum)
Clinical Social Worker facilitated patient discharge including contacting patient family (left several messages with family members) and facility to confirm patient discharge plans.  Clinical information faxed to facility and family agreeable with plan.  CSW arranged ambulance transport via [facility] to [facility].  RN to call report prior to discharge.  DC packet to be prepared and placed on chart for transport.    ADDENDUM: Patient's niece, [name] returned phone call in regards to discharge planning. Pt's niece agreeable to discharge plans for [facility] and will contact admissions director to set up a time to complete admissions paperwork before pt's arrival.   Clinical Social Worker will sign off for now as social work intervention is no longer needed. Please consult [place] again if new need arises.  Glendon Axe, MSW, [place] [phone] 10/18/2014 12:05 PM

## 2014-10-18 NOTE — Progress Notes (Signed)
.  5 oxy IR pill wasted with Payton Emerald, RN

## 2014-10-18 NOTE — Discharge Instructions (Signed)
Ischemic Stroke °A stroke (cerebrovascular accident) is the sudden death of brain tissue. It is a medical emergency. A stroke can cause permanent loss of brain function. This can cause problems with different parts of your body. A transient ischemic attack (TIA) is different because it does not cause permanent damage. A TIA is a short-lived problem of poor blood flow affecting a part of the brain. A TIA is also a serious problem because having a TIA greatly increases the chances of having a stroke. When symptoms first develop, you cannot know if the problem might be a stroke or a TIA. °CAUSES  °A stroke is caused by a decrease of oxygen supply to an area of your brain. It is usually the result of a small blood clot or collection of cholesterol or fat (plaque) that blocks blood flow in the brain. A stroke can also be caused by blocked or damaged carotid arteries.  °RISK FACTORS °· High blood pressure (hypertension). °· High cholesterol. °· Diabetes mellitus. °· Heart disease. °· The buildup of plaque in the blood vessels (peripheral artery disease or atherosclerosis). °· The buildup of plaque in the blood vessels providing blood and oxygen to the brain (carotid artery stenosis). °· An abnormal heart rhythm (atrial fibrillation). °· Obesity. °· Smoking. °· Taking oral contraceptives (especially in combination with smoking). °· Physical inactivity. °· A diet high in fats, salt (sodium), and calories. °· Alcohol use. °· Use of illegal drugs (especially cocaine and methamphetamine). °· Being African American. °· Being over the age of 55. °· Family history of stroke. °· Previous history of blood clots, stroke, TIA, or heart attack. °· Sickle cell disease. °SYMPTOMS  °These symptoms usually develop suddenly, or may be newly present upon awakening from sleep: °· Sudden weakness or numbness of the face, arm, or leg, especially on one side of the body. °· Sudden trouble walking or difficulty moving arms or legs. °· Sudden  confusion. °· Sudden personality changes. °· Trouble speaking (aphasia) or understanding. °· Difficulty swallowing. °· Sudden trouble seeing in one or both eyes. °· Double vision. °· Dizziness. °· Loss of balance or coordination. °· Sudden severe headache with no known cause. °· Trouble reading or writing. °DIAGNOSIS  °Your health care provider can often determine the presence or absence of a stroke based on your symptoms, history, and physical exam. Computed tomography (CT) of the brain is usually performed to confirm the stroke, determine causes, and determine stroke severity. Other tests may be done to find the cause of the stroke. These tests may include: °· Electrocardiography. °· Continuous heart monitoring. °· Echocardiography. °· Carotid ultrasonography. °· Magnetic resonance imaging (MRI). °· A scan of the brain circulation. °· Blood tests. °PREVENTION  °The risk of a stroke can be decreased by appropriately treating high blood pressure, high cholesterol, diabetes, heart disease, and obesity and by quitting smoking, limiting alcohol, and staying physically active. °TREATMENT  °Time is of the essence. It is important to seek treatment at the first sign of these symptoms because you may receive a medicine to dissolve the clot (thrombolytic) that cannot be given if too much time has passed since your symptoms began. Even if you do not know when your symptoms began, get treatment as soon as possible as there are other treatment options available including oxygen, intravenous (IV) fluids, and medicines to thin the blood (anticoagulants). Treatment of stroke depends on the duration, severity, and cause of your symptoms. Medicines and dietary changes may be used to address diabetes, high blood   pressure, and other risk factors. Physical, speech, and occupational therapists will assess you and work with you to improve any functions impaired by the stroke. Measures will be taken to prevent short-term and long-term  complications, including infection from breathing foreign material into the lungs (aspiration pneumonia), blood clots in the legs, bedsores, and falls. Rarely, surgery may be needed to remove large blood clots or to open up blocked arteries. °HOME CARE INSTRUCTIONS  °· Take medicines only as directed by your health care provider. Follow the directions carefully. Medicines may be used to control risk factors for a stroke. Be sure you understand all your medicine instructions. °· You may be told to take a medicine to thin the blood, such as aspirin or the anticoagulant warfarin. Warfarin needs to be taken exactly as instructed. °¨ Too much and too little warfarin are both dangerous. Too much warfarin increases the risk of bleeding. Too little warfarin continues to allow the risk for blood clots. While taking warfarin, you will need to have regular blood tests to measure your blood clotting time. These blood tests usually include both the PT and INR tests. The PT and INR results allow your health care provider to adjust your dose of warfarin. The dose can change for many reasons. It is critically important that you take warfarin exactly as prescribed, and that you have your PT and INR levels drawn exactly as directed. °¨ Many foods, especially foods high in vitamin K, can interfere with warfarin and affect the PT and INR results. Foods high in vitamin K include spinach, kale, broccoli, cabbage, collard and turnip greens, brussels sprouts, peas, cauliflower, seaweed, and parsley, as well as beef and pork liver, green tea, and soybean oil. You should eat a consistent amount of foods high in vitamin K. Avoid major changes in your diet, or notify your health care provider before changing your diet. Arrange a visit with a dietitian to answer your questions. °¨ Many medicines can interfere with warfarin and affect the PT and INR results. You must tell your health care provider about any and all medicines you take. This  includes all vitamins and supplements. Be especially cautious with aspirin and anti-inflammatory medicines. Do not take or discontinue any prescribed or over-the-counter medicine except on the advice of your health care provider or pharmacist. °¨ Warfarin can have side effects, such as excessive bruising or bleeding. You will need to hold pressure over cuts for longer than usual. Your health care provider or pharmacist will discuss other potential side effects. °¨ Avoid sports or activities that may cause injury or bleeding. °¨ Be mindful when shaving, flossing your teeth, or handling sharp objects. °¨ Alcohol can change the body's ability to handle warfarin. It is best to avoid alcoholic drinks or consume only very small amounts while taking warfarin. Notify your health care provider if you change your alcohol intake. °¨ Notify your dentist or other health care providers before procedures. °· If swallow studies have determined that your swallowing reflex is present, you should eat healthy foods. Including 5 or more servings of fruits and vegetables a day may reduce the risk of stroke. Foods may need to be a certain consistency (soft or pureed), or small bites may need to be taken in order to avoid aspirating or choking. Certain dietary changes may be advised to address high blood pressure, high cholesterol, diabetes, or obesity. °¨ Food choices that are low in sodium, saturated fat, trans fat, and cholesterol are recommended to manage high blood pressure. °¨   Food choies that are high in fiber, and low in saturated fat, trans fat, and cholesterol may control cholesterol levels. °¨ Controlling carbohydrates and sugar intake is recommended to manage diabetes. °¨ Reducing calorie intake and making food choices that are low in sodium, saturated fat, trans fat, and cholesterol are recommended to manage obesity. °· Maintain a healthy weight. °· Stay physically active. It is recommended that you get at least 30 minutes of  activity on all or most days. °· Do not use any tobacco products including cigarettes, chewing tobacco, or electronic cigarettes. °· Limit alcohol use even if you are not taking warfarin. Moderate alcohol use is considered to be: °¨ No more than 2 drinks each day for men. °¨ No more than 1 drink each day for nonpregnant women. °· Home safety. A safe home environment is important to reduce the risk of falls. Your health care provider may arrange for specialists to evaluate your home. Having grab bars in the bedroom and bathroom is often important. Your health care provider may arrange for equipment to be used at home, such as raised toilets and a seat for the shower. °· Physical, occupational, and speech therapy. Ongoing therapy may be needed to maximize your recovery after a stroke. If you have been advised to use a walker or a cane, use it at all times. Be sure to keep your therapy appointments. °· Follow all instructions for follow-up with your health care provider. This is very important. This includes any referrals, physical therapy, rehabilitation, and lab tests. Proper follow-up can prevent another stroke from occurring. °SEEK MEDICAL CARE IF: °· You have personality changes. °· You have difficulty swallowing. °· You are seeing double. °· You have dizziness. °· You have a fever. °· You have skin breakdown. °SEEK IMMEDIATE MEDICAL CARE IF:  °Any of these symptoms may represent a serious problem that is an emergency. Do not wait to see if the symptoms will go away. Get medical help right away. Call your local emergency services (911 in [place] Do not drive yourself to the hospital. °· You have sudden weakness or numbness of the face, arm, or leg, especially on one side of the body. °· You have sudden trouble walking or difficulty moving arms or legs. °· You have sudden confusion. °· You have trouble speaking (aphasia) or understanding. °· You have sudden trouble seeing in one or both eyes. °· You have a loss of  balance or coordination. °· You have a sudden, severe headache with no known cause. °· You have new chest pain or an irregular heartbeat. °· You have a partial or total loss of consciousness. °Document Released: 01/07/2005 Document Revised: 05/24/2013 Document Reviewed: 08/18/2011 °Exit[employer]® Patient Information ©2015 [employer]. This information is not intended to replace advice given to you by your health care provider. Make sure you discuss any questions you have with your health care provider. ° °

## 2014-10-19 ENCOUNTER — Emergency Department (HOSPITAL_COMMUNITY)
Admission: EM | Admit: 2014-10-19 | Discharge: 2014-10-20 | Disposition: A | Discharge status: Home / Self Care | Payer: Medicare Other | Attending: Emergency Medicine | Admitting: Emergency Medicine

## 2014-10-19 ENCOUNTER — Encounter (HOSPITAL_COMMUNITY): Payer: Self-pay

## 2014-10-19 ENCOUNTER — Encounter: Payer: Self-pay | Admitting: Internal Medicine

## 2014-10-19 ENCOUNTER — Emergency Department (HOSPITAL_COMMUNITY): Payer: Medicare Other

## 2014-10-19 ENCOUNTER — Non-Acute Institutional Stay (SKILLED_NURSING_FACILITY): Payer: Medicare Other | Admitting: Internal Medicine

## 2014-10-19 DIAGNOSIS — C3431 Malignant neoplasm of lower lobe, right bronchus or lung: Secondary | ICD-10-CM | POA: Diagnosis not present

## 2014-10-19 DIAGNOSIS — I10 Essential (primary) hypertension: Secondary | ICD-10-CM

## 2014-10-19 DIAGNOSIS — Y998 Other external cause status: Secondary | ICD-10-CM | POA: Diagnosis not present

## 2014-10-19 DIAGNOSIS — Z85118 Personal history of other malignant neoplasm of bronchus and lung: Secondary | ICD-10-CM | POA: Insufficient documentation

## 2014-10-19 DIAGNOSIS — Z7982 Long term (current) use of aspirin: Secondary | ICD-10-CM | POA: Insufficient documentation

## 2014-10-19 DIAGNOSIS — J984 Other disorders of lung: Secondary | ICD-10-CM

## 2014-10-19 DIAGNOSIS — Z8719 Personal history of other diseases of the digestive system: Secondary | ICD-10-CM | POA: Insufficient documentation

## 2014-10-19 DIAGNOSIS — S80211A Abrasion, right knee, initial encounter: Secondary | ICD-10-CM | POA: Insufficient documentation

## 2014-10-19 DIAGNOSIS — Y9289 Other specified places as the place of occurrence of the external cause: Secondary | ICD-10-CM | POA: Diagnosis not present

## 2014-10-19 DIAGNOSIS — S0121XA Laceration without foreign body of nose, initial encounter: Secondary | ICD-10-CM | POA: Insufficient documentation

## 2014-10-19 DIAGNOSIS — J189 Pneumonia, unspecified organism: Secondary | ICD-10-CM | POA: Diagnosis not present

## 2014-10-19 DIAGNOSIS — Z8701 Personal history of pneumonia (recurrent): Secondary | ICD-10-CM | POA: Insufficient documentation

## 2014-10-19 DIAGNOSIS — Y9389 Activity, other specified: Secondary | ICD-10-CM | POA: Diagnosis not present

## 2014-10-19 DIAGNOSIS — Z7902 Long term (current) use of antithrombotics/antiplatelets: Secondary | ICD-10-CM | POA: Insufficient documentation

## 2014-10-19 DIAGNOSIS — W19XXXD Unspecified fall, subsequent encounter: Secondary | ICD-10-CM

## 2014-10-19 DIAGNOSIS — E785 Hyperlipidemia, unspecified: Secondary | ICD-10-CM | POA: Diagnosis not present

## 2014-10-19 DIAGNOSIS — G40209 Localization-related (focal) (partial) symptomatic epilepsy and epileptic syndromes with complex partial seizures, not intractable, without status epilepticus: Secondary | ICD-10-CM | POA: Diagnosis not present

## 2014-10-19 DIAGNOSIS — I639 Cerebral infarction, unspecified: Secondary | ICD-10-CM | POA: Diagnosis not present

## 2014-10-19 DIAGNOSIS — J449 Chronic obstructive pulmonary disease, unspecified: Secondary | ICD-10-CM | POA: Diagnosis not present

## 2014-10-19 DIAGNOSIS — H9190 Unspecified hearing loss, unspecified ear: Secondary | ICD-10-CM | POA: Diagnosis not present

## 2014-10-19 DIAGNOSIS — Z720 Tobacco use: Secondary | ICD-10-CM | POA: Diagnosis not present

## 2014-10-19 DIAGNOSIS — G629 Polyneuropathy, unspecified: Secondary | ICD-10-CM | POA: Diagnosis not present

## 2014-10-19 DIAGNOSIS — F0390 Unspecified dementia without behavioral disturbance: Secondary | ICD-10-CM | POA: Insufficient documentation

## 2014-10-19 DIAGNOSIS — S80212A Abrasion, left knee, initial encounter: Secondary | ICD-10-CM | POA: Insufficient documentation

## 2014-10-19 DIAGNOSIS — G40909 Epilepsy, unspecified, not intractable, without status epilepticus: Secondary | ICD-10-CM | POA: Diagnosis not present

## 2014-10-19 DIAGNOSIS — W1839XA Other fall on same level, initial encounter: Secondary | ICD-10-CM | POA: Diagnosis not present

## 2014-10-19 DIAGNOSIS — Z79899 Other long term (current) drug therapy: Secondary | ICD-10-CM | POA: Insufficient documentation

## 2014-10-19 DIAGNOSIS — Z8673 Personal history of transient ischemic attack (TIA), and cerebral infarction without residual deficits: Secondary | ICD-10-CM | POA: Insufficient documentation

## 2014-10-19 DIAGNOSIS — Z7951 Long term (current) use of inhaled steroids: Secondary | ICD-10-CM | POA: Insufficient documentation

## 2014-10-19 DIAGNOSIS — W19XXXA Unspecified fall, initial encounter: Secondary | ICD-10-CM

## 2014-10-19 DIAGNOSIS — S0181XA Laceration without foreign body of other part of head, initial encounter: Secondary | ICD-10-CM

## 2014-10-19 HISTORY — DX: Unspecified dementia, unspecified severity, without behavioral disturbance, psychotic disturbance, mood disturbance, and anxiety: F03.90

## 2014-10-19 LAB — CULTURE, BLOOD (ROUTINE X 2)
Culture: NO GROWTH
Culture: NO GROWTH

## 2014-10-19 NOTE — ED Notes (Signed)
Per [facility]: Pt fell today, hitting his face. Pt has a laceration to nose and possible deformity. Abrasion to left and and right knee. The pt is on plavix. Pt from [facility]. HX dementia. At base line. Pt would not tolerate c collar, towel applied by [facility] to limit mobility. Pt was ambulatory on scene. Pt has frequent falls. This was an unwitnessed fall. Believes he tripped on his feet, reports no loss of consciousness.

## 2014-10-19 NOTE — Progress Notes (Signed)
MRN: [id number] Name: Paul [name]  Sex: male Age: 81 y.o. DOB: Mar 26, 1930  [place] #: [facility] Facility/Room:101 Level Of Care: SNF Provider: Inocencio Homes D Emergency Contacts: Extended Emergency Contact Information Primary Emergency Contact: Lona Kettle [place] Phone: [phone] Mobile Phone: [phone] Relation: Niece Secondary Emergency Contact: Hazeline Junker [place] Phone: [phone] Mobile Phone: [phone] Relation: None  Code Status:   Allergies: Review of patient's allergies indicates no known allergies.  Chief Complaint  Patient presents with  . New Admit To SNF    HPI: Patient is 81 y.o. male whopast medical history of hypertension, GERD, tobacco abuse, COPD, history of complex partial seizures, non-small cell lung cancer, previous history of stroke, presented with fall and possible syncope. On examination, he was found to have left-sided weakness. MRI showed a right-sided stroke. Of note, patient was discharged from [facility] on September 22 after being managed for pneumonia. Pt was admitted form 9/22-27 for management of new onset CVA. Pt is admitted to SNF s/p CVA for OT/PT. While at SNF pt will be followed for seizures, tx with dilantin, HLD, tx with lipitor and neuropathy tx with lyrica.  Past Medical History  Diagnosis Date  . Hypertension   . COPD (chronic obstructive pulmonary disease) ([place])   . Hemorrhoids     external  . Complex partial seizure ([place])     from a remote accident  . GERD (gastroesophageal reflux disease)   . Emphysema   . HOH (hard of hearing)   . Pneumonia   . Seizures ([place])   . Headache(784.0)   . Non-small cell carcinoma of lung ([place]) dx'd 2006    stage 3  . Lung cancer ([place])   . Stroke ([place])   . Dementia     Past Surgical History  Procedure Laterality Date  . Inner ear surgery    . Toe amputation Left   . Tonsillectomy    . Amputation Left 04/16/2013     Procedure: AMPUTATION RAY;  Surgeon: Newt Minion, MD;  Location: [place];  Service: Orthopedics;  Laterality: Left;  Left Foot 4th and 5th Ray Amputation  . Lipoma excision Left 09/08/2013    Procedure: EXCISION OF LEFT PINNA SKIN CANCER WITH FROZEN SECTION ;  Surgeon: Izora Gala, MD;  Location: [place];  Service: ENT;  Laterality: Left;      Medication List       This list is accurate as of: 10/19/14 10:29 PM.  Always use your most recent med list.               acetaminophen 325 MG tablet  Commonly known as:  TYLENOL  Take 2 tablets (650 mg total) by mouth every 6 (six) hours as needed for mild pain (or Fever >/= 101).     acetaminophen 325 MG tablet  Commonly known as:  TYLENOL  Take 2 tablets (650 mg total) by mouth 3 (three) times daily.     amoxicillin-clavulanate 875-125 MG tablet  Commonly known as:  AUGMENTIN  Take 1 tablet by mouth every 12 (twelve) hours. For 15 more days     aspirin 81 MG EC tablet  Take 1 tablet (81 mg total) by mouth daily.     atorvastatin 20 MG tablet  Commonly known as:  LIPITOR  Take 1 tablet (20 mg total) by mouth daily at 6 PM.     clopidogrel 75 MG tablet  Commonly known as:  PLAVIX  Take 1  tablet (75 mg total) by mouth daily.     feeding supplement (ENSURE ENLIVE) Liqd  Take 237 mLs by mouth 3 (three) times daily between meals.     Fluticasone-Salmeterol 250-50 MCG/DOSE Aepb  Commonly known as:  ADVAIR  Inhale 1 puff into the lungs 2 (two) times daily as needed (shortness of breath).     ipratropium-albuterol 0.5-2.5 (3) MG/3ML Soln  Commonly known as:  DUONEB  Take 3 mLs by nebulization every 4 (four) hours as needed (wheezing).     nicotine 21 mg/24hr patch  Commonly known as:  NICODERM CQ - dosed in mg/24 hours  Place 1 patch (21 mg total) onto the skin daily.     OLANZapine 5 MG tablet  Commonly known as:  ZYPREXA  Take 1 tablet (5 mg total) by mouth at bedtime.     oxyCODONE 5 MG immediate release tablet  Commonly  known as:  Oxy IR/ROXICODONE  Take 0.5 tablets (2.5 mg total) by mouth 3 (three) times daily.     phenytoin 300 MG ER capsule  Commonly known as:  DILANTIN  Take 1 capsule (300 mg total) by mouth daily.     pregabalin 25 MG capsule  Commonly known as:  LYRICA  Take 1 capsule (25 mg total) by mouth 2 (two) times daily.     PROAIR HFA 108 (90 BASE) MCG/ACT inhaler  Generic drug:  albuterol  INHALE 2 PUFFS EVERY 4-6 HOURS AS NEEDED FOR COUGH OR WHEEZING        No orders of the defined types were placed in this encounter.    Immunization History  Administered Date(s) Administered  . Influenza Split 01/10/2011  . Influenza Whole 12/03/2006  . Pneumococcal Polysaccharide-23 12/03/2006  . Td 07/13/2011  . Tdap 02/09/2012, 10/13/2014    Social History  Substance Use Topics  . Smoking status: Current Every Day Smoker -- 1.50 packs/day for 60 years    Types: Cigarettes  . Smokeless tobacco: Never Used  . Alcohol Use: No    Family history is + CHF   Review of Systems  DATA OBTAINED: from nurse GENERAL:  no fevers, fatigue, appetite changes SKIN: No itching, rash or wounds EYES: No eye pain, redness, discharge EARS: No earache, tinnitus, change in hearing NOSE: No congestion, drainage or bleeding  MOUTH/THROAT: No mouth or tooth pain, No sore throat RESPIRATORY: No cough, wheezing, SOB CARDIAC: No chest pain, palpitations, lower extremity edema  GI: No abdominal pain, No N/V/D or constipation, No heartburn or reflux  GU: No dysuria, frequency or urgency, or incontinence  MUSCULOSKELETAL: No unrelieved bone/joint pain NEUROLOGIC: No headache, dizziness or new focal weakness PSYCHIATRIC: No c/o anxiety or sadness   Filed Vitals:   10/19/14 1254  BP: 105/60  Pulse: 98  Temp: 98.2 F (36.8 C)  Resp: 20    SpO2 Readings from Last 1 Encounters:  10/20/14 92%        Physical Exam  GENERAL APPEARANCE: Alert, conversant without making sense,  No acute distress.   SKIN: No diaphoresis rash HEAD: Normocephalic, atraumatic  EYES: Conjunctiva/lids clear. Pupils round, reactive. EOMs intact.  EARS: External exam WNL, canals clear. Hearing grossly normal.  NOSE: No deformity or discharge.  MOUTH/THROAT: Lips w/o lesions  RESPIRATORY: Breathing is even, unlabored. Lung sounds are clear   CARDIOVASCULAR: Heart RRR no murmurs, rubs or gallops. No peripheral edema.   GASTROINTESTINAL: Abdomen is soft, non-tender, not distended w/ normal bowel sounds. GENITOURINARY: Bladder non tender, not distended  MUSCULOSKELETAL: No abnormal  joints or musculature NEUROLOGIC:  Cranial nerves 2-12 grossly intact; L side weakness  PSYCHIATRIC: confused, no behavioral issues  Patient Active Problem List   Diagnosis Date Noted  . Hyperlipidemia 10/23/2014  . Neuropathy ([place]) 10/23/2014  . Palliative care encounter   . Stroke ([place])   . Complex partial seizure ([place]) 10/14/2014  . Altered mental status   . Stroke with cerebral ischemia ([place])   . Sepsis ([place])   . Faintness   . Weakness   . Protein-calorie malnutrition, severe ([place]) 10/12/2014  . Cavitary pneumonia 10/12/2014  . Lung cancer ([place])   . CAP (community acquired pneumonia) 10/10/2014  . Shortness of breath 10/10/2014  . FTT (failure to thrive) in adult 10/10/2014  . CVA (cerebral infarction) 06/10/2013  . Acute CVA (cerebrovascular accident) ([place]) 06/10/2013  . Accidental drug overdose--narcotics 04/01/2013  . Chronic osteomyelitis of left foot s/p toe amputation and ischemic changes 04/01/2013  . Acute encephalopathy 04/01/2013  . Falls 07/15/2012  . Mass of right ear canal 07/15/2012  . Dysphagia 01/16/2012  . Cellulitis of left lower extremity 01/16/2012  . PERSONAL HISTORY, VENOUS THROMBOSIS AND EMBOLISM 03/23/2008  . Acute thromboembolism of deep veins of lower extremity ([place]) 12/03/2006  . INSOMNIA 12/03/2006  . TOBACCO ABUSE 01/02/2006  . Essential hypertension 01/02/2006  . EXTERNAL  HEMORRHOIDS 01/02/2006  . COPD, MILD 01/02/2006  . GASTROESOPHAGEAL REFLUX DISEASE 01/02/2006  . BURSITIS, SHOULDER 01/02/2006  . Complex partial seizures ([place]) 01/02/2006  . ALCOHOL ABUSE, HX OF 01/02/2006  . CARCINOMA, LUNG, SQUAMOUS CELL 02/07/2004    CBC    Component Value Date/Time   WBC 6.6 10/15/2014 0550   WBC 5.6 12/25/2012 0832   RBC 5.08 10/15/2014 0550   RBC 5.08 12/25/2012 0832   HGB 13.5 10/15/2014 0550   HGB 15.2 12/25/2012 0832   HCT 41.5 10/15/2014 0550   HCT 45.8 12/25/2012 0832   PLT 398 10/15/2014 0550   PLT 212 12/25/2012 0832   MCV 81.7 10/15/2014 0550   MCV 90.1 12/25/2012 0832   LYMPHSABS 0.5* 10/13/2014 2000   LYMPHSABS 0.9 12/25/2012 0832   MONOABS 1.0 10/13/2014 2000   MONOABS 0.7 12/25/2012 0832   EOSABS 0.0 10/13/2014 2000   EOSABS 0.3 12/25/2012 0832   BASOSABS 0.0 10/13/2014 2000   BASOSABS 0.0 12/25/2012 0832    CMP     Component Value Date/Time   NA 132* 10/16/2014 0613   NA 140 12/25/2012 0832   NA 142 02/15/2011 0956   K 5.0 10/16/2014 0613   K 4.7 12/25/2012 0832   K 4.4 02/15/2011 0956   CL 101 10/16/2014 0613   CL 100 04/16/2012 0832   CL 99 02/15/2011 0956   CO2 22 10/16/2014 0613   CO2 27 12/25/2012 0832   CO2 29 02/15/2011 0956   GLUCOSE 92 10/16/2014 0613   GLUCOSE 123 12/25/2012 0832   GLUCOSE 113* 04/16/2012 0832   GLUCOSE 137* 02/15/2011 0956   BUN 9 10/16/2014 0613   BUN 7.5 12/25/2012 0832   BUN 8 02/15/2011 0956   CREATININE 0.52* 10/16/2014 0613   CREATININE 0.8 12/25/2012 0832   CREATININE 0.5* 02/15/2011 0956   CALCIUM 8.1* 10/16/2014 0613   CALCIUM 9.3 12/25/2012 0832   CALCIUM 8.8 02/15/2011 0956   PROT 5.7* 10/13/2014 2000   PROT 6.9 12/25/2012 0832   PROT 7.4 02/15/2011 0956   ALBUMIN 2.0* 10/13/2014 2000   ALBUMIN 3.6 12/25/2012 0832   AST 23 10/13/2014 2000   AST 14 12/25/2012 0832   AST  18 02/15/2011 0956   ALT 16* 10/13/2014 2000   ALT 13 12/25/2012 0832   ALT 19 02/15/2011 0956    ALKPHOS 97 10/13/2014 2000   ALKPHOS 118 12/25/2012 0832   ALKPHOS 109* 02/15/2011 0956   BILITOT 0.5 10/13/2014 2000   BILITOT 0.32 12/25/2012 0832   BILITOT 0.50 02/15/2011 0956   GFRNONAA >60 10/16/2014 0613   GFRAA >60 10/16/2014 0613    Lab Results  Component Value Date   HGBA1C 6.1* 10/14/2014     Ct Angio Head W/cm &/or Wo Cm  10/15/2014   ADDENDUM REPORT: 10/15/2014 09:51 ADDENDUM: Study discussed by telephone with Dr. Rosalin Hawking on 10/15/2014 at 0950 hours. There should also be an additional Impression regarding the left carotid system which reads: - High-grade stenosis left ICA siphon, and distal left ICA bulb stenosis 65%. Electronically Signed   By: Genevie Ann M.D.   On: 10/15/2014 09:51  10/15/2014   CLINICAL DATA:  81 year old male with frontal headache. Syncope. Left arm weakness. Patchy acute right MCA territory infarcts diagnosed on recent brain MRI. Initial encounter.  EXAM: CT ANGIOGRAPHY HEAD AND NECK  TECHNIQUE: Multidetector CT imaging of the head and neck was performed using the standard protocol during bolus administration of intravenous contrast. Multiplanar CT image reconstructions and MIPs were obtained to evaluate the vascular anatomy. Carotid stenosis measurements (when applicable) are obtained utilizing NASCET criteria, using the distal internal carotid diameter as the denominator.  CONTRAST:  61m OMNIPAQUE IOHEXOL 350 MG/ML SOLN  COMPARISON:  Brain MRI without contrast 10/14/2014, and earlier. Chest CT 10/14/2014.  FINDINGS: CT HEAD  Brain: Mild cortical and white matter hypodensity corresponding to the patchy superior right hemisphere infarcts seen on 10/14/2014 (series 6, image 27). No hemorrhagic transformation or mass effect. Right occipital pole hypodensity also appears increased (image 16). Elsewhere stable gray-white matter differentiation.  No acute intracranial hemorrhage identified. No ventriculomegaly. No intracranial mass effect.  Calvarium and skull base:  No  acute osseous abnormality identified.  Paranasal sinuses: Right maxillary sinus fluid level and bubbly opacity. Right mastoid effusion.  Orbits: Visualized orbits and scalp soft tissues are within normal limits.  CTA NECK  Skeleton: Advanced degenerative changes in the left cervical spine. T5 compression fracture is stable from the recent chest CT. Osteopenia. Chronic right clavicle fracture. No suspicious osseous lesion in the neck.  Other neck: Increased layering secretions in the distal trachea and mainstem bronchi (series 8, image 1). Stable abnormal lung apices.  Thyroid, larynx, pharynx, parapharyngeal spaces, retropharyngeal space, sublingual space, submandibular glands, and parotid glands are within normal limits for age. No cervical lymphadenopathy.  Aortic arch: 3 vessel arch configuration with moderate soft and calcified plaque affecting the arch.  Right carotid system: Radiographic string sign stenosis right CCA 2 cm beyond its origin (series 8, image 34 and series 10, image 125) with confluent surrounding soft plaque or thrombus. Despite this the right CCA is patent. Soft and calcified plaque at the right carotid bifurcation results in up to 50 % stenosis with respect to the distal vessel. Otherwise negative cervical right ICA.  Left carotid system: Medial soft plaque in the proximal left CCA resulting in less than 50 % stenosis with respect to the distal vessel (series 8, image 24). Bulky calcified plaque at the left carotid bifurcation. This continues into the left ICA bulb. Stenosis at the distal bulb level up to 65 % stenosis with respect to the distal vessel (series 10, image 100). Otherwise negative cervical left ICA.  Vertebral  arteries:  Tortuous proximal right subclavian artery with a kinked appearance plus superimposed soft and calcified plaque. Subsequent up to 50 % stenosis with respect to the distal vessel. Right vertebral artery origin is occluded (series 8, image 34). Right V1 segment is  occluded. Reconstituted flow in the V2 segment at the C3-C4 level from muscular collaterals. However, the distal right vertebral artery tapers to the skullbase but does remain patent.  Soft and calcified plaque in the proximal left subclavian artery. Ulcerated plaque on series 8, image 34 just proximal to the left vertebral artery origin. Less than 50% stenosis of the proximal left subclavian. Soft and calcified plaque at the left vertebral artery origin but only mild subsequent stenosis. Tortuous left V1 segment. No left V2 or V3 stenosis.  CTA HEAD  Posterior circulation: Diminutive distal right vertebral artery is patent to the vertebrobasilar junction. Dominant right AICA. No distal left vertebral artery stenosis, in that vessel primarily supplies the basilar. Patent left AICA origin. No basilar artery stenosis. Normal SCA and left PCA origins. Fetal type right PCA origin. Left posterior communicating artery is diminutive. Bilateral PCA branches are patent and mildly irregular.  Anterior circulation: Severe calcified plaque of both ICA siphons resulting in high-grade vertical peach wrists segment stenosis worse on the right. Despite this both ICA siphons remain patent. Tandem high-grade stenosis at the right anterior genu from additional calcified plaque. Both carotid termini are patent. Ophthalmic and posterior communicating artery origins are normal.  Normal MCA and ACA origins. Diminutive anterior communicating artery. Bilateral ACA branches are within normal limits. Left MCA M1 segment, bifurcation, and branches are within normal limits.  Right MCA origin and M1 segment are patent. Right MCA bifurcation is patent. No right MCA branch occlusion identified.  Venous sinuses: Patent.  Anatomic variants: None.  Delayed phase: No abnormal enhancement identified.  IMPRESSION: 1. Bulky soft plaque or thrombus in the proximal right CCA with RADIOGRAPHIC STRING SIGN stenosis, 2 cm from the arch. Additional plaque at  the right carotid bifurcation is not hemodynamically significant. However, there are tandem high-grade stenoses also in the right ICA siphon related to bulky calcified plaque. 2. No right MCA stenosis or branch occlusion identified. 3. Occluded right vertebral artery origin with intermediate reconstitution in the distal V2 segment, but diminutive intracranial right vertebral artery. Left vertebral artery primarily supplies the basilar with no significant stenosis. 4. Fairly extensive soft atherosclerotic plaque. Multiple areas of ulcerated plaque at the arch and proximal great vessels. 5. Expected evolution of patchy right MCA infarcts with no hemorrhagic transformation or intracranial mass effect. 6. Stable abnormal visible right lung. Interval increased retained secretions in the distal trachea and mainstem bronchi.  Electronically Signed: By: Genevie Ann M.D. On: 10/15/2014 09:36   Ct Head Wo Contrast  10/14/2014   CLINICAL DATA:  Code stroke. Altered mental status. Right-sided gaze. Recent contrast-enhanced CT.  EXAM: CT HEAD WITHOUT CONTRAST  TECHNIQUE: Contiguous axial images were obtained from the base of the skull through the vertex without intravenous contrast.  COMPARISON:  MRI brain 10/14/2014.  CT head 10/13/2014.  FINDINGS: Diffuse cerebral atrophy. Ventricular dilatation consistent with central atrophy. Low-attenuation changes throughout the deep white matter consistent with small vessel ischemia. No change in parenchymal pattern since previous study. No mass effect or midline shift. No abnormal extra-axial fluid collections. Gray-white matter junctions are distinct. Basal cisterns are not effaced. No evidence of acute intracranial hemorrhage. No depressed skull fractures. Mucosal thickening in the right maxillary antrum.  IMPRESSION: No acute  intracranial abnormalities. Chronic atrophy and small vessel ischemic changes diffusely.  These results were called by telephone at the time of interpretation on  10/14/2014 at 6:27 am to Dr. Clance Boll , who verbally acknowledged these results.   Electronically Signed   By: Lucienne Capers M.D.   On: 10/14/2014 06:28   Ct Angio Neck W/cm &/or Wo/cm  10/15/2014   ADDENDUM REPORT: 10/15/2014 09:51 ADDENDUM: Study discussed by telephone with Dr. Rosalin Hawking on 10/15/2014 at 0950 hours. There should also be an additional Impression regarding the left carotid system which reads: - High-grade stenosis left ICA siphon, and distal left ICA bulb stenosis 65%. Electronically Signed   By: Genevie Ann M.D.   On: 10/15/2014 09:51  10/15/2014   CLINICAL DATA:  81 year old male with frontal headache. Syncope. Left arm weakness. Patchy acute right MCA territory infarcts diagnosed on recent brain MRI. Initial encounter.  EXAM: CT ANGIOGRAPHY HEAD AND NECK  TECHNIQUE: Multidetector CT imaging of the head and neck was performed using the standard protocol during bolus administration of intravenous contrast. Multiplanar CT image reconstructions and MIPs were obtained to evaluate the vascular anatomy. Carotid stenosis measurements (when applicable) are obtained utilizing NASCET criteria, using the distal internal carotid diameter as the denominator.  CONTRAST:  80m OMNIPAQUE IOHEXOL 350 MG/ML SOLN  COMPARISON:  Brain MRI without contrast 10/14/2014, and earlier. Chest CT 10/14/2014.  FINDINGS: CT HEAD  Brain: Mild cortical and white matter hypodensity corresponding to the patchy superior right hemisphere infarcts seen on 10/14/2014 (series 6, image 27). No hemorrhagic transformation or mass effect. Right occipital pole hypodensity also appears increased (image 16). Elsewhere stable gray-white matter differentiation.  No acute intracranial hemorrhage identified. No ventriculomegaly. No intracranial mass effect.  Calvarium and skull base:  No acute osseous abnormality identified.  Paranasal sinuses: Right maxillary sinus fluid level and bubbly opacity. Right mastoid effusion.  Orbits:  Visualized orbits and scalp soft tissues are within normal limits.  CTA NECK  Skeleton: Advanced degenerative changes in the left cervical spine. T5 compression fracture is stable from the recent chest CT. Osteopenia. Chronic right clavicle fracture. No suspicious osseous lesion in the neck.  Other neck: Increased layering secretions in the distal trachea and mainstem bronchi (series 8, image 1). Stable abnormal lung apices.  Thyroid, larynx, pharynx, parapharyngeal spaces, retropharyngeal space, sublingual space, submandibular glands, and parotid glands are within normal limits for age. No cervical lymphadenopathy.  Aortic arch: 3 vessel arch configuration with moderate soft and calcified plaque affecting the arch.  Right carotid system: Radiographic string sign stenosis right CCA 2 cm beyond its origin (series 8, image 34 and series 10, image 125) with confluent surrounding soft plaque or thrombus. Despite this the right CCA is patent. Soft and calcified plaque at the right carotid bifurcation results in up to 50 % stenosis with respect to the distal vessel. Otherwise negative cervical right ICA.  Left carotid system: Medial soft plaque in the proximal left CCA resulting in less than 50 % stenosis with respect to the distal vessel (series 8, image 24). Bulky calcified plaque at the left carotid bifurcation. This continues into the left ICA bulb. Stenosis at the distal bulb level up to 65 % stenosis with respect to the distal vessel (series 10, image 100). Otherwise negative cervical left ICA.  Vertebral arteries:  Tortuous proximal right subclavian artery with a kinked appearance plus superimposed soft and calcified plaque. Subsequent up to 50 % stenosis with respect to the distal vessel. Right vertebral artery origin  is occluded (series 8, image 34). Right V1 segment is occluded. Reconstituted flow in the V2 segment at the C3-C4 level from muscular collaterals. However, the distal right vertebral artery tapers to  the skullbase but does remain patent.  Soft and calcified plaque in the proximal left subclavian artery. Ulcerated plaque on series 8, image 34 just proximal to the left vertebral artery origin. Less than 50% stenosis of the proximal left subclavian. Soft and calcified plaque at the left vertebral artery origin but only mild subsequent stenosis. Tortuous left V1 segment. No left V2 or V3 stenosis.  CTA HEAD  Posterior circulation: Diminutive distal right vertebral artery is patent to the vertebrobasilar junction. Dominant right AICA. No distal left vertebral artery stenosis, in that vessel primarily supplies the basilar. Patent left AICA origin. No basilar artery stenosis. Normal SCA and left PCA origins. Fetal type right PCA origin. Left posterior communicating artery is diminutive. Bilateral PCA branches are patent and mildly irregular.  Anterior circulation: Severe calcified plaque of both ICA siphons resulting in high-grade vertical peach wrists segment stenosis worse on the right. Despite this both ICA siphons remain patent. Tandem high-grade stenosis at the right anterior genu from additional calcified plaque. Both carotid termini are patent. Ophthalmic and posterior communicating artery origins are normal.  Normal MCA and ACA origins. Diminutive anterior communicating artery. Bilateral ACA branches are within normal limits. Left MCA M1 segment, bifurcation, and branches are within normal limits.  Right MCA origin and M1 segment are patent. Right MCA bifurcation is patent. No right MCA branch occlusion identified.  Venous sinuses: Patent.  Anatomic variants: None.  Delayed phase: No abnormal enhancement identified.  IMPRESSION: 1. Bulky soft plaque or thrombus in the proximal right CCA with RADIOGRAPHIC STRING SIGN stenosis, 2 cm from the arch. Additional plaque at the right carotid bifurcation is not hemodynamically significant. However, there are tandem high-grade stenoses also in the right ICA siphon  related to bulky calcified plaque. 2. No right MCA stenosis or branch occlusion identified. 3. Occluded right vertebral artery origin with intermediate reconstitution in the distal V2 segment, but diminutive intracranial right vertebral artery. Left vertebral artery primarily supplies the basilar with no significant stenosis. 4. Fairly extensive soft atherosclerotic plaque. Multiple areas of ulcerated plaque at the arch and proximal great vessels. 5. Expected evolution of patchy right MCA infarcts with no hemorrhagic transformation or intracranial mass effect. 6. Stable abnormal visible right lung. Interval increased retained secretions in the distal trachea and mainstem bronchi.  Electronically Signed: By: Genevie Ann M.D. On: 10/15/2014 09:36   Ct Angio Chest Pe W/cm &/or Wo Cm  10/14/2014   CLINICAL DATA:  Shortness of breath, syncope, and tachycardia. History of DVT.  EXAM: CT ANGIOGRAPHY CHEST WITH CONTRAST  TECHNIQUE: Multidetector CT imaging of the chest was performed using the standard protocol during bolus administration of intravenous contrast. Multiplanar CT image reconstructions and MIPs were obtained to evaluate the vascular anatomy.  CONTRAST:  27m OMNIPAQUE IOHEXOL 350 MG/ML SOLN  COMPARISON:  10/10/2014  FINDINGS: Technically adequate study with good opacification of the central and segmental pulmonary arteries. No focal filling defects. No evidence of significant pulmonary embolus.  Normal heart size. Normal caliber thoracic aorta. Eccentric noncalcified plaque formation in the lower descending aorta. No aortic dissection. Calcification in the aorta. Great vessel origins are patent. Focal stenosis of the proximal right carotid artery representing about 75% diameter reduction focally. Distal flow is demonstrated. Esophagus is decompressed. Moderate-sized esophageal hiatal hernia. No significant lymphadenopathy in the chest.  Diffuse emphysematous changes throughout both lungs. Consolidation in the  right upper lung with air bronchograms possibly representing changes related to known lung cancer. This could represent postobstructive change or post treatment change. Patchy airspace infiltrates in the right apex may be inflammatory consolidation or atelectasis in the right lung base is progressing since prior study. Loculated right pleural fluid and gas collections anteriorly and in the base. These were present previously and could be due to infection or post treatment changes or perhaps less likely pleural metastasis. Mucoid material demonstrated in bilateral lower lobe bronchi. Area of cavitation versus emphysematous bulla with small air-fluid level in the left lung base is similar to prior study.  Included portions of the upper abdominal organs are grossly unremarkable. Or post obstructive enter similar to prior study.  Review of the MIP images confirms the above findings.  IMPRESSION: No evidence of significant pulmonary embolus. Diffuse emphysematous changes throughout the lungs. Areas of consolidation in the right upper lung and right lung base probably related to history of lung cancer. Changes may represent postobstructive consolidation or treatment changes. Patchy airspace disease in the right apex. Loculated gas and fluid collections in the right pleural space. Mucous plugging.   Electronically Signed   By: Lucienne Capers M.D.   On: 10/14/2014 02:45   Mr Brain Wo Contrast  10/14/2014   CLINICAL DATA:  Initial evaluation for acute syncope, fall. History of probable recurrent lung cancer.  EXAM: MRI HEAD WITHOUT CONTRAST  TECHNIQUE: Multiplanar, multiecho pulse sequences of the brain and surrounding structures were obtained without intravenous contrast.  COMPARISON:  Prior CT from 10/13/2014 as well as earlier studies.  FINDINGS: Generalized cerebral volume loss with chronic microvascular ischemic changes again seen. Remote cortical infarct within the right occipital lobe.  Multiple patchy cortical,  subcortical, and periventricular ischemic infarcts seen involving the right frontal, parietal, and occipital lobes in a fairly linear watershed distribution. The largest of these foci involves the cortical gray matter in the anterior right frontal lobe (series 3, image 38). No associated hemorrhage or mass effect. Diminished flow void within the distal right vertebral artery, which may be related to slow flow or occlusion. Major intracranial flow voids otherwise maintained.  No acute or chronic intracranial hemorrhage. No mass lesion, midline shift, or mass effect. Ventricular prominence related to global parenchymal volume loss present without hydrocephalus. No extra-axial fluid collection.  Craniocervical junction within normal limits. Pituitary gland normal.  No acute abnormality about the orbits.  Moderate mucosal thickening within the right maxillary sinus. Paranasal sinuses are otherwise clear. Right mastoid effusion noted. Inner ear structures normal.  Bone marrow signal intensity within normal limits. Scalp soft tissues unremarkable.  IMPRESSION: 1. Patchy multi focal ischemic infarcts involving the right frontal, parietal, and occipital lobes, largely in a watershed type distribution. No associated hemorrhage or mass effect. 2. Abnormal flow void within the distal right vertebral artery, which may be related to slow flow or possibly occlusion. Otherwise preserved major intracranial flow voids. 3. Advanced cerebral atrophy with moderate chronic microvascular ischemic changes.   Electronically Signed   By: Jeannine Boga M.D.   On: 10/14/2014 01:20   Mr Jeri Cos Contrast  10/14/2014   CLINICAL DATA:  81 year old male with syncope. History of lung cancer, unclear recurrence/disease status at this time. Subsequent encounter.  EXAM: MRI HEAD WITH CONTRAST  TECHNIQUE: Multiplanar, multiecho pulse sequences of the brain and surrounding structures were obtained with intravenous contrast.  COMPARISON:  Brain  MRI without contrast 0004 hr today,  and earlier.  CONTRAST:  4m MULTIHANCE GADOBENATE DIMEGLUMINE 529 MG/ML IV SOLN  FINDINGS: No abnormal enhancement or brain mass identified. No post ischemic enhancement related to the right hemisphere findings reported earlier today. No dural thickening identified. No abnormal enhancement identified in the visualized cervical spine. No suspicious calvarium enhancement identified. Stable cerebral morphology, no intracranial mass effect.  IMPRESSION: No evidence of metastatic disease to the brain.   Electronically Signed   By: HGenevie AnnM.D.   On: 10/14/2014 21:32    Not all labs, radiology exams or other studies done during hospitalization come through on my EPIC note; however they are reviewed by me.    Assessment and Plan  Acute CVA (cerebrovascular accident) Evaluation has detected diffuse cerebrovascular disease, especially of the right common carotid artery and internal carotid artery. Neurology has recommended dual antiplatelets treatment. Due to noncompliance and multiple falls, he is not a candidate for anticoagulation. Palliative medicine was consulted to determine patient's goals of care. Unfortunately no immediate family is available. Attempts are ongoing to locate his daughter. PT and OT recommends SNF. Patient agreeable to go to one. Patient on aspirin and Plavix. Echocardiogram as above. LDL is 82. Patient is on a statin.HbA1c is 6.1. Patient has been seen by psychiatry and it has been determined that he lacks capacity. Palliative medicine discussions can be continued at skilled nursing facility. Was initially started on Seroquel for mood stabilization. Changed to Zyprexa by palliative medicine. SNF - admit for PT/OT   Complex partial seizures ([facility] Patient's Dilantin level was initially subtherapeutic. He was loaded with Dilantin. Dilantin level is 12. Albumen is 2.0. Corrected level is 24. No changes to his dosage. SNF - Continue current dose of  Dilantin.  Falls Prob 2/2 CVA; SNF - OT/PT  Cavitary pneumonia Continue Augmentin. Plan was to continue for 3 weeks from last hospitalization. He has 2 more weeks of treatment left. CT angiogram of chest was done. No PE was noticed. SNF - cont augmentin 2 more weeks   Lung cancer Patient to follow-up with his oncologist Dr. MJulien Nordmann MRI brain did not show any metastases. CT angiogram of the chest did not show any obvious recurrence of his cancer.   Essential hypertension SNF - reported high in hosp inirially, but controlled on no meds and no meds prior; SNF - monitor BP on no meds  Neuropathy (HCC) SNF - cont lyrica  Hyperlipidemia LDL - 82, HDL - 45; SNF - cont lipitor   Time spent > 45 min;> 50% of time with patient was spent reviewing records, labs, tests and studies, counseling and developing plan of care  AHennie Duos MD

## 2014-10-19 NOTE — ED Provider Notes (Signed)
CSN: [id number]     Arrival date & time 10/19/14  2229 History  By signing my name below, I, Hansel Feinstein, attest that this documentation has been prepared under the direction and in the presence of Merryl Hacker, MD. Electronically Signed: Hansel Feinstein, ED Scribe. 10/19/2014. 11:17 PM.    Chief Complaint  Patient presents with  . Fall  . Laceration   LEVEL 5 CAVEAT: HPI and ROS limited due to dementia.   The history is provided by the patient. The history is limited by the condition of the patient. No language interpreter was used.    HPI Comments: Paul [name] is a 81 y.o. male with hx of HTN, COPD, seizures, TIA, dementia who presents to the Emergency Department complaining of an unwitnessed fall that occurred approximately 45 minutes PTA. Per pt, he hit his head, but there was no LOC. He presents with associated laceration with controlled bleeding to his nose. Pt was also seen in the ED for a syncopal event on 10/13/14. Pt is on Plavix. He denies HA, numbness, tingling, focal weakness, hematuria, abdominal pain.  Of note, patient with recent admission for pneumonia followed by admission for CVA with residual left-sided deficits. He is on Dilantin for seizure control. Was discharged yesterday.  Past Medical History  Diagnosis Date  . Hypertension   . COPD (chronic obstructive pulmonary disease)   . Hemorrhoids     external  . 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 GERD (gastroesophageal reflux disease)   . Emphysema   . HOH (hard of hearing)   . Pneumonia   . Seizures   . Headache(784.0)   . Non-small cell carcinoma of lung dx'd 2006    stage 3  . Lung cancer   . Stroke   . Dementia    Past Surgical History  Procedure Laterality Date  . Inner ear surgery    . Toe amputation Left   . Tonsillectomy    . Amputation Left 04/16/2013    Procedure: AMPUTATION RAY;  Surgeon: Newt Minion, MD;  Location: [facility];  Service: Orthopedics;  Laterality: Left;  Left Foot  4th and 5th Ray Amputation  . 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 Heart failure Brother    Social History  Substance Use Topics  . Smoking status: Current Every Day Smoker -- 1.50 packs/day for 60 years    Types: Cigarettes  . Smokeless tobacco: Never Used  . Alcohol Use: No    Review of Systems  Unable to perform ROS: Dementia  Skin: Positive for wound.  All other systems reviewed and are negative.  Allergies  Review of patient's allergies indicates no known allergies.  Home Medications   Prior to Admission medications   Medication Sig Start Date End Date Taking? Authorizing Provider  acetaminophen (TYLENOL) 325 MG tablet Take 2 tablets (650 mg total) by mouth every 6 (six) hours as needed for mild pain (or Fever >/= 101). Patient taking differently: Take 650 mg by mouth daily as needed for mild pain (or Fever >/= 101).  10/18/14  Yes Bonnielee Haff, MD  acetaminophen (TYLENOL) 325 MG tablet Take 2 tablets (650 mg total) by mouth 3 (three) times daily. 10/18/14  Yes Bonnielee Haff, MD  amoxicillin-clavulanate (  AUGMENTIN) 875-125 MG per tablet Take 1 tablet by mouth every 12 (twelve) hours. For 15 more days 10/18/14 11/07/14 Yes Bonnielee Haff, MD  aspirin EC 81 MG EC tablet Take 1 tablet (81 mg total) by mouth daily. 10/18/14  Yes Bonnielee Haff, MD  atorvastatin (LIPITOR) 20 MG tablet Take 1 tablet (20 mg total) by mouth daily at 6 PM. 10/18/14  Yes Bonnielee Haff, MD  bisacodyl (DULCOLAX) 10 MG suppository Place 10 mg rectally as needed for moderate constipation.   Yes Historical Provider, MD  clopidogrel (PLAVIX) 75 MG tablet Take 1 tablet (75 mg total) by mouth daily. 10/18/14  Yes Bonnielee Haff, MD  feeding supplement, ENSURE ENLIVE, (ENSURE ENLIVE) LIQD Take 237 mLs by mouth 3 (three)  times daily between meals. 10/18/14  Yes Bonnielee Haff, MD  Fluticasone-Salmeterol (ADVAIR) 250-50 MCG/DOSE AEPB Inhale 1 puff into the lungs 2 (two) times daily as needed (shortness of breath).    Yes Historical Provider, MD  ipratropium-albuterol (DUONEB) 0.5-2.5 (3) MG/3ML SOLN Take 3 mLs by nebulization every 4 (four) hours as needed (wheezing). 10/18/14 10/18/15 Yes Bonnielee Haff, MD  magnesium hydroxide (MILK OF MAGNESIA) 400 MG/5ML suspension Take 30 mLs by mouth daily as needed for mild constipation.   Yes Historical Provider, MD  nicotine (NICODERM CQ - DOSED IN MG/24 HOURS) 21 mg/24hr patch Place 1 patch (21 mg total) onto the skin daily. 10/18/14  Yes Bonnielee Haff, MD  OLANZapine (ZYPREXA) 5 MG tablet Take 1 tablet (5 mg total) by mouth at bedtime. 10/18/14  Yes Acquanetta Chain, DO  oxyCODONE (OXY IR/ROXICODONE) 5 MG immediate release tablet Take 0.5 tablets (2.5 mg total) by mouth 3 (three) times daily. 10/18/14  Yes Acquanetta Chain, DO  phenytoin (DILANTIN) 300 MG ER capsule Take 1 capsule (300 mg total) by mouth daily. 10/18/14  Yes Bonnielee Haff, MD  pregabalin (LYRICA) 25 MG capsule Take 1 capsule (25 mg total) by mouth 2 (two) times daily. 10/18/14  Yes Acquanetta Chain, DO  PROAIR HFA 108 (90 BASE) MCG/ACT inhaler INHALE 2 PUFFS EVERY 4-6 HOURS AS NEEDED FOR COUGH OR WHEEZING 08/29/14  Yes Historical Provider, MD  Sodium Phosphates (RA SALINE ENEMA RE) Place 1 each rectally as needed (for constipation).   Yes Historical Provider, MD   BP 149/67 mmHg  Pulse 104  Temp(Src) 98.3 F (36.8 C) (Oral)  Resp 24  SpO2 97% Physical Exam  Constitutional:  Early, frail  HENT:  Head: Normocephalic.  Right Ear: External ear normal.  Left Ear: External ear normal.  1 cm U-shaped laceration over the bridge of patient's nose, mild oozing  Eyes: Pupils are equal, round, and reactive to light.  Neck: Neck supple.  Immobilized  Cardiovascular: Normal rate, regular rhythm and normal  heart sounds.   No murmur heard. Pulmonary/Chest: Effort normal and breath sounds normal. No respiratory distress. He has no wheezes.  Decreased breath sounds right lower lobe, bruising noted over the right lower chest and back  Abdominal: Soft. Bowel sounds are normal. There is no tenderness. There is no rebound and no guarding.  Musculoskeletal: He exhibits no edema.  Normal range of motion of bilateral hips and knees, no obvious deformities  Neurological: He is alert.  Oriented only to self, moves all 4 extremities left lower extremity > left upper extremity weakness, follows simple commands  Skin: Skin is warm and dry.  Mild abrasions over the bilateral knees  Psychiatric: He has a normal mood and affect.  Nursing note and vitals  reviewed.   ED Course  Procedures (including critical care time) DIAGNOSTIC STUDIES: Oxygen Saturation is 94% on RA, adequate by my interpretation.    COORDINATION OF CARE: 11:14 PM Discussed treatment plan with pt at bedside and pt agreed to plan.   2:43 AM  LACERATION REPAIR Performed by: PA student, Langston Masker Consent: Verbal consent obtained. Risks and benefits: risks, benefits and alternatives were discussed Patient identity confirmed: provided demographic data Time out performed prior to procedure Prepped and Draped in normal sterile fashion Wound explored Laceration Location: bridge of the nose Laceration Length: 1cm No Foreign Bodies seen or palpated Anesthesia: local infiltration Local anesthetic: lidocaine 1% w epinephrine Anesthetic total: 2 ml Irrigation method: syringe Amount of cleaning: standard Skin closure: steri strips  Patient tolerance: Patient tolerated the procedure well with no immediate complications.  Skin too then to repair with sutures.  Labs Review Labs Reviewed  URINALYSIS, ROUTINE W REFLEX MICROSCOPIC (NOT AT [facility]) - Abnormal; Notable for the following:    APPearance TURBID (*)    Hgb urine dipstick  MODERATE (*)    All other components within normal limits  URINE MICROSCOPIC-ADD ON - Abnormal; Notable for the following:    Bacteria, UA MANY (*)    All other components within normal limits    Imaging Review Dg Chest 2 View  10/20/2014   CLINICAL DATA:  Fall  EXAM: CHEST  2 VIEW  COMPARISON:  Chest CT 10/10/2014.  Chest x-ray 6 days ago  FINDINGS: Treated right lung cancer with extensive fibrosis and lung opacity. COPD with hyperinflation and emphysematous change. Progressive opacification at the right base with obscured medial diaphragm. Normal heart size and stable mediastinal distortion. No acute fracture is seen.  IMPRESSION: 1. Progressive right basilar opacification which could reflect aspiration, pneumonia, or postobstructive atelectasis (extensive airway debris present on chest CT from 5 days ago). 2. Diffuse emphysematous and post treatment changes.   Electronically Signed   By: Monte Fantasia M.D.   On: 10/20/2014 00:27   Ct Head Wo Contrast  10/20/2014   CLINICAL DATA:  Fall with nasal laceration and neck soreness. Initial encounter.  EXAM: CT HEAD WITHOUT CONTRAST  CT CERVICAL SPINE WITHOUT CONTRAST  TECHNIQUE: Multidetector CT imaging of the head and cervical spine was performed following the standard protocol without intravenous contrast. Multiplanar CT image reconstructions of the cervical spine were also generated.  COMPARISON:  CTA 10/15/2014  FINDINGS: CT HEAD FINDINGS  Skull and Sinuses:Chronic right otomastoiditis with nonvisualized ossicles. Chronic right maxillary sinusitis with dependent debris.  Marked swelling over the nasal bridge with overlying bandage. Lucencies in the bilateral nasal arch are chronic based on head CT from 4 days ago. No acute fracture detected.  Orbits: No acute abnormality.  Brain: Areas of cortical low-density along the right cerebral convexity correlating with infarcts seen 09/24/2014 by MRI. No evidence of progressive infarction. No hemorrhage,  hydrocephalus, or shift. Generalized cortical atrophy. Chronic small-vessel disease throughout the cerebral white matter.  CT CERVICAL SPINE FINDINGS  Negative for acute fracture or subluxation. No prevertebral edema. No gross cervical canal hematoma.  Diffuse degenerative disc and facet disease. No indication of high-grade canal stenosis or cord compression.  Chronic changes in the right apical lung as seen on recent CT.  IMPRESSION: 1. No evidence of acute intracranial or cervical spine injury. 2. Multiple recent cortical infarcts in the right cerebral hemisphere. No evidence of progression or hemorrhagic conversion. 3. Nasal bridge swelling with remote nasal arch fractures. No acute fracture identified.  Electronically Signed   By: Monte Fantasia M.D.   On: 10/20/2014 00:03   Ct Cervical Spine Wo Contrast  10/20/2014   CLINICAL DATA:  Fall with nasal laceration and neck soreness. Initial encounter.  EXAM: CT HEAD WITHOUT CONTRAST  CT CERVICAL SPINE WITHOUT CONTRAST  TECHNIQUE: Multidetector CT imaging of the head and cervical spine was performed following the standard protocol without intravenous contrast. Multiplanar CT image reconstructions of the cervical spine were also generated.  COMPARISON:  CTA 10/15/2014  FINDINGS: CT HEAD FINDINGS  Skull and Sinuses:Chronic right otomastoiditis with nonvisualized ossicles. Chronic right maxillary sinusitis with dependent debris.  Marked swelling over the nasal bridge with overlying bandage. Lucencies in the bilateral nasal arch are chronic based on head CT from 4 days ago. No acute fracture detected.  Orbits: No acute abnormality.  Brain: Areas of cortical low-density along the right cerebral convexity correlating with infarcts seen 09/24/2014 by MRI. No evidence of progressive infarction. No hemorrhage, hydrocephalus, or shift. Generalized cortical atrophy. Chronic small-vessel disease throughout the cerebral white matter.  CT CERVICAL SPINE FINDINGS  Negative for  acute fracture or subluxation. No prevertebral edema. No gross cervical canal hematoma.  Diffuse degenerative disc and facet disease. No indication of high-grade canal stenosis or cord compression.  Chronic changes in the right apical lung as seen on recent CT.  IMPRESSION: 1. No evidence of acute intracranial or cervical spine injury. 2. Multiple recent cortical infarcts in the right cerebral hemisphere. No evidence of progression or hemorrhagic conversion. 3. Nasal bridge swelling with remote nasal arch fractures. No acute fracture identified.   Electronically Signed   By: Monte Fantasia M.D.   On: 10/20/2014 00:03   I have personally reviewed and evaluated these images and lab results as part of my medical decision-making.   EKG Interpretation   Date/Time:  Wednesday October 19 2014 22:43:17 EDT Ventricular Rate:  102 PR Interval:  178 QRS Duration: 128 QT Interval:  391 QTC Calculation: 509 R Axis:   92 Text Interpretation:  Sinus tachycardia Right bundle branch block ST  elevation, consider inferior injury Baseline wander in lead(s) I III aVR  aVL No significant change since last tracing Confirmed by HORTON  MD,  Loma Sousa ([id number]) on 10/20/2014 12:00:56 AM      MDM   Final diagnoses:  Fall, initial encounter  Facial laceration, initial encounter   Patient presents following a fall. Unclear the events surrounding the fall. Recent admission for a stroke. Unclear how mobile the patient is at baseline given recent stroke. Patient sustained a laceration to the bridge of the nose. Received Tdap 10/13/14 per chart.  CT head and neck obtained. EKG shows no evidence of arrhythmia. Urinalysis without evidence of urinary tract infection. Patient had a therapeutic Dilantin level yesterday. Will not repeat. Imaging notable for residual right basilar opacification. Per chart, patient is on Augmentin for several more weeks.  Head and neck negative.  Patient's skin is very thin. Laceration not  amenable to sutures.  Steri-Strips were placed by PA student. Will discharge back to living facility.  After history, exam, and medical workup I feel the patient has been appropriately medically screened and is safe for discharge home. Pertinent diagnoses were discussed with the patient. Patient was given return precautions.  I personally performed the services described in this documentation, which was scribed in my presence. The recorded information has been reviewed and is accurate.   Merryl Hacker, MD 10/20/14 [phone]

## 2014-10-19 NOTE — ED Notes (Signed)
Dr. Horton at bedside. 

## 2014-10-20 DIAGNOSIS — S0121XA Laceration without foreign body of nose, initial encounter: Secondary | ICD-10-CM | POA: Diagnosis not present

## 2014-10-20 LAB — URINALYSIS, ROUTINE W REFLEX MICROSCOPIC
BILIRUBIN URINE: NEGATIVE
Glucose, UA: NEGATIVE mg/dL
KETONES UR: NEGATIVE mg/dL
Leukocytes, UA: NEGATIVE
NITRITE: NEGATIVE
PROTEIN: NEGATIVE mg/dL
Specific Gravity, Urine: 1.02 (ref 1.005–1.030)
UROBILINOGEN UA: 0.2 mg/dL (ref 0.0–1.0)
pH: 7.5 (ref 5.0–8.0)

## 2014-10-20 LAB — URINE MICROSCOPIC-ADD ON

## 2014-10-20 MED ORDER — LIDOCAINE-EPINEPHRINE (PF) 2 %-1:200000 IJ SOLN
20.0000 mL | Freq: Once | INTRAMUSCULAR | Status: AC
  Administered 2014-10-20: 20 mL
  Filled 2014-10-20: qty 20

## 2014-10-20 NOTE — Discharge Instructions (Signed)
Patient was seen today after a fall. Workup is largely reassuring. He does have a small laceration to the bridge of the nose. Steri-Strips were applied. Follow-up with primary physician as needed.  Facial Laceration  A facial laceration is a cut on the face. These injuries can be painful and cause bleeding. Lacerations usually heal quickly, but they need special care to reduce scarring. DIAGNOSIS  Your health care provider will take a medical history, ask for details about how the injury occurred, and examine the wound to determine how deep the cut is. TREATMENT  Some facial lacerations may not require closure. Others may not be able to be closed because of an increased risk of infection. The risk of infection and the chance for successful closure will depend on various factors, including the amount of time since the injury occurred. The wound may be cleaned to help prevent infection. If closure is appropriate, pain medicines may be given if needed. Your health care provider will use stitches (sutures), wound glue (adhesive), or skin adhesive strips to repair the laceration. These tools bring the skin edges together to allow for faster healing and a better cosmetic outcome. If needed, you may also be given a tetanus shot. HOME CARE INSTRUCTIONS  Only take over-the-counter or prescription medicines as directed by your health care provider.  Follow your health care provider's instructions for wound care. These instructions will vary depending on the technique used for closing the wound. For Sutures:  Keep the wound clean and dry.   If you were given a bandage (dressing), you should change it at least once a day. Also change the dressing if it becomes wet or dirty, or as directed by your health care provider.   Wash the wound with soap and water 2 times a day. Rinse the wound off with water to remove all soap. Pat the wound dry with a clean towel.   After cleaning, apply a thin layer of the  antibiotic ointment recommended by your health care provider. This will help prevent infection and keep the dressing from sticking.   You may shower as usual after the first 24 hours. Do not soak the wound in water until the sutures are removed.   Get your sutures removed as directed by your health care provider. With facial lacerations, sutures should usually be taken out after 4-5 days to avoid stitch marks.   Wait a few days after your sutures are removed before applying any makeup. For Skin Adhesive Strips:  Keep the wound clean and dry.   Do not get the skin adhesive strips wet. You may bathe carefully, using caution to keep the wound dry.   If the wound gets wet, pat it dry with a clean towel.   Skin adhesive strips will fall off on their own. You may trim the strips as the wound heals. Do not remove skin adhesive strips that are still stuck to the wound. They will fall off in time.  For Wound Adhesive:  You may briefly wet your wound in the shower or bath. Do not soak or scrub the wound. Do not swim. Avoid periods of heavy sweating until the skin adhesive has fallen off on its own. After showering or bathing, gently pat the wound dry with a clean towel.   Do not apply liquid medicine, cream medicine, ointment medicine, or makeup to your wound while the skin adhesive is in place. This may loosen the film before your wound is healed.   If a dressing  is placed over the wound, be careful not to apply tape directly over the skin adhesive. This may cause the adhesive to be pulled off before the wound is healed.   Avoid prolonged exposure to sunlight or tanning lamps while the skin adhesive is in place.  The skin adhesive will usually remain in place for 5-10 days, then naturally fall off the skin. Do not pick at the adhesive film.  After Healing: Once the wound has healed, cover the wound with sunscreen during the day for 1 full year. This can help minimize scarring. Exposure  to ultraviolet light in the first year will darken the scar. It can take 1-2 years for the scar to lose its redness and to heal completely.  SEEK IMMEDIATE MEDICAL CARE IF:  You have redness, pain, or swelling around the wound.   You see ayellowish-white fluid (pus) coming from the wound.   You have chills or a fever.  MAKE SURE YOU:  Understand these instructions.  Will watch your condition.  Will get help right away if you are not doing well or get worse. Document Released: 02/15/2004 Document Revised: 10/28/2012 Document Reviewed: 08/20/2012 [facility] Patient Information 2015 [place], [place]. This information is not intended to replace advice given to you by your health care provider. Make sure you discuss any questions you have with your health care provider.

## 2014-10-23 ENCOUNTER — Emergency Department (HOSPITAL_COMMUNITY)
Admission: EM | Admit: 2014-10-23 | Discharge: 2014-10-24 | Disposition: A | Discharge status: Home / Self Care | Payer: Medicare Other | Attending: Emergency Medicine | Admitting: Emergency Medicine

## 2014-10-23 ENCOUNTER — Emergency Department (HOSPITAL_COMMUNITY): Payer: Medicare Other

## 2014-10-23 ENCOUNTER — Encounter: Payer: Self-pay | Admitting: Internal Medicine

## 2014-10-23 DIAGNOSIS — E785 Hyperlipidemia, unspecified: Secondary | ICD-10-CM | POA: Insufficient documentation

## 2014-10-23 DIAGNOSIS — Z7902 Long term (current) use of antithrombotics/antiplatelets: Secondary | ICD-10-CM | POA: Diagnosis not present

## 2014-10-23 DIAGNOSIS — Z720 Tobacco use: Secondary | ICD-10-CM | POA: Insufficient documentation

## 2014-10-23 DIAGNOSIS — I10 Essential (primary) hypertension: Secondary | ICD-10-CM | POA: Diagnosis not present

## 2014-10-23 DIAGNOSIS — Z8673 Personal history of transient ischemic attack (TIA), and cerebral infarction without residual deficits: Secondary | ICD-10-CM | POA: Diagnosis not present

## 2014-10-23 DIAGNOSIS — H9190 Unspecified hearing loss, unspecified ear: Secondary | ICD-10-CM | POA: Insufficient documentation

## 2014-10-23 DIAGNOSIS — J441 Chronic obstructive pulmonary disease with (acute) exacerbation: Secondary | ICD-10-CM | POA: Diagnosis not present

## 2014-10-23 DIAGNOSIS — Z8719 Personal history of other diseases of the digestive system: Secondary | ICD-10-CM | POA: Insufficient documentation

## 2014-10-23 DIAGNOSIS — Z7982 Long term (current) use of aspirin: Secondary | ICD-10-CM | POA: Diagnosis not present

## 2014-10-23 DIAGNOSIS — Z79899 Other long term (current) drug therapy: Secondary | ICD-10-CM | POA: Insufficient documentation

## 2014-10-23 DIAGNOSIS — Z8701 Personal history of pneumonia (recurrent): Secondary | ICD-10-CM | POA: Insufficient documentation

## 2014-10-23 DIAGNOSIS — R0602 Shortness of breath: Secondary | ICD-10-CM | POA: Diagnosis present

## 2014-10-23 DIAGNOSIS — F0390 Unspecified dementia without behavioral disturbance: Secondary | ICD-10-CM | POA: Diagnosis not present

## 2014-10-23 DIAGNOSIS — Z23 Encounter for immunization: Secondary | ICD-10-CM | POA: Diagnosis not present

## 2014-10-23 DIAGNOSIS — Z85118 Personal history of other malignant neoplasm of bronchus and lung: Secondary | ICD-10-CM | POA: Diagnosis not present

## 2014-10-23 DIAGNOSIS — G629 Polyneuropathy, unspecified: Secondary | ICD-10-CM | POA: Insufficient documentation

## 2014-10-23 DIAGNOSIS — Z7951 Long term (current) use of inhaled steroids: Secondary | ICD-10-CM | POA: Diagnosis not present

## 2014-10-23 MED ORDER — TETANUS-DIPHTH-ACELL PERTUSSIS 5-2.5-18.5 LF-MCG/0.5 IM SUSP
0.5000 mL | Freq: Once | INTRAMUSCULAR | Status: AC
  Administered 2014-10-24: 0.5 mL via INTRAMUSCULAR
  Filled 2014-10-23: qty 0.5

## 2014-10-23 NOTE — Assessment & Plan Note (Signed)
Evaluation has detected diffuse cerebrovascular disease, especially of the right common carotid artery and internal carotid artery. Neurology has recommended dual antiplatelets treatment. Due to noncompliance and multiple falls, he is not a candidate for anticoagulation. Palliative medicine was consulted to determine patient's goals of care. Unfortunately no immediate family is available. Attempts are ongoing to locate his daughter. PT and OT recommends SNF. Patient agreeable to go to one. Patient on aspirin and Plavix. Echocardiogram as above. LDL is 82. Patient is on a statin.HbA1c is 6.1. Patient has been seen by psychiatry and it has been determined that he lacks capacity. Palliative medicine discussions can be continued at skilled nursing facility. Was initially started on Seroquel for mood stabilization. Changed to Zyprexa by palliative medicine. SNF - admit for PT/OT

## 2014-10-23 NOTE — ED Notes (Signed)
Bed: RESA Expected date:  Expected time:  Means of arrival:  Comments: EMS dc'd a few hours ago to home no AC and No O2. Pt is O2 dependent
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
## 2014-10-23 NOTE — Assessment & Plan Note (Signed)
Continue Augmentin. Plan was to continue for 3 weeks from last hospitalization. He has 2 more weeks of treatment left. CT angiogram of chest was done. No PE was noticed. SNF - cont augmentin 2 more weeks
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
## 2014-10-23 NOTE — Assessment & Plan Note (Signed)
Patient to follow-up with his oncologist Dr. Julien Nordmann. MRI brain did not show any metastases. CT angiogram of the chest did not show any obvious recurrence of his cancer.

## 2014-10-23 NOTE — Assessment & Plan Note (Signed)
LDL - 82, HDL - 45; SNF - cont lipitor

## 2014-10-23 NOTE — Assessment & Plan Note (Signed)
SNF - cont lyrica

## 2014-10-23 NOTE — Assessment & Plan Note (Signed)
Patient's Dilantin level was initially subtherapeutic. He was loaded with Dilantin. Dilantin level is 12. Albumen is 2.0. Corrected level is 24. No changes to his dosage. SNF - Continue current dose of Dilantin.

## 2014-10-23 NOTE — Assessment & Plan Note (Signed)
SNF - reported high in hosp inirially, but controlled on no meds and no meds prior; SNF - monitor BP on no meds

## 2014-10-23 NOTE — Assessment & Plan Note (Signed)
Prob 2/2 CVA; SNF - OT/PT

## 2014-10-24 ENCOUNTER — Emergency Department (HOSPITAL_COMMUNITY): Payer: Medicare Other

## 2014-10-24 ENCOUNTER — Encounter (HOSPITAL_COMMUNITY): Payer: Self-pay | Admitting: Emergency Medicine

## 2014-10-24 DIAGNOSIS — J441 Chronic obstructive pulmonary disease with (acute) exacerbation: Secondary | ICD-10-CM | POA: Diagnosis not present

## 2014-10-24 LAB — I-STAT CHEM 8, ED
BUN: 12 mg/dL (ref 6–20)
CALCIUM ION: 1.12 mmol/L — AB (ref 1.13–1.30)
CREATININE: 0.5 mg/dL — AB (ref 0.61–1.24)
Chloride: 97 mmol/L — ABNORMAL LOW (ref 101–111)
GLUCOSE: 100 mg/dL — AB (ref 65–99)
HCT: 39 % (ref 39.0–52.0)
HEMOGLOBIN: 13.3 g/dL (ref 13.0–17.0)
POTASSIUM: 3.7 mmol/L (ref 3.5–5.1)
Sodium: 137 mmol/L (ref 135–145)
TCO2: 29 mmol/L (ref 0–100)

## 2014-10-24 LAB — CBC WITH DIFFERENTIAL/PLATELET
BASOS ABS: 0 10*3/uL (ref 0.0–0.1)
Basophils Relative: 0 %
EOS PCT: 1 %
Eosinophils Absolute: 0.1 10*3/uL (ref 0.0–0.7)
HCT: 37.1 % — ABNORMAL LOW (ref 39.0–52.0)
Hemoglobin: 11.9 g/dL — ABNORMAL LOW (ref 13.0–17.0)
LYMPHS PCT: 5 %
Lymphs Abs: 0.4 10*3/uL — ABNORMAL LOW (ref 0.7–4.0)
MCH: 26.4 pg (ref 26.0–34.0)
MCHC: 32.1 g/dL (ref 30.0–36.0)
MCV: 82.4 fL (ref 78.0–100.0)
MONO ABS: 0.9 10*3/uL (ref 0.1–1.0)
MONOS PCT: 10 %
Neutro Abs: 7.5 10*3/uL (ref 1.7–7.7)
Neutrophils Relative %: 84 %
PLATELETS: 406 10*3/uL — AB (ref 150–400)
RBC: 4.5 MIL/uL (ref 4.22–5.81)
RDW: 15.3 % (ref 11.5–15.5)
WBC: 8.9 10*3/uL (ref 4.0–10.5)

## 2014-10-24 LAB — BRAIN NATRIURETIC PEPTIDE: B Natriuretic Peptide: 81.2 pg/mL (ref 0.0–100.0)

## 2014-10-24 LAB — I-STAT TROPONIN, ED: TROPONIN I, POC: 0.01 ng/mL (ref 0.00–0.08)

## 2014-10-24 NOTE — ED Notes (Signed)
Awake. Verbally responsive. A/O x1 (self). Resp even and unlabored. No audible adventitious breath sounds noted. ABC's intact.

## 2014-10-24 NOTE — Discharge Instructions (Signed)
Dementia Dementia is a word that is used to describe problems with the brain and how it works. People with dementia have memory loss. They may also have problems with thinking, speaking, or solving problems. It can affect how they act around people, how they do their job, their mood, and their personality. These changes may not show up for a long time. Family or friends may not notice problems in the early part of this disease. HOME CARE The following tips are for the person living with, or caring for, the person with dementia. Make the home safe.  Remove locks on bathroom doors.  Use childproof locks on cabinets where alcohol, cleaning supplies, or chemicals are stored.  Put outlet covers in electrical outlets.  Put in childproof locks to keep doors and windows safe.  Remove stove knobs, or put in safety knobs that shut off on their own.  Lower the temperature on water heaters.  Label medicines. Lock them in a safe place.  Keep knives, lighters, matches, power tools, and guns out of reach or in a safe place.  Remove objects that might break or can hurt the person.  Make sure lighting is good inside and outside.  Put in grab bars if needed.  Use a device that detects falls or other needs for help. Lessen confusion.  Keep familiar objects and people around.  Use night lights or low lit (dim) lights at night.  Label objects or areas.  Use reminders, notes, or directions for daily activities or tasks.  Keep a simple routine that is the same for waking, meals, bathing, dressing, and bedtime.  Create a calm and quiet home.  Put up clocks and calendars.  Keep emergency numbers and the home address near all phones.  Help show the different times of day. Open the curtains during the day to let light in. Speak clearly and directly.  Choose simple words and short sentences.  Use a gentle, calm voice.  Do not interrupt.  If the person has a hard time finding a word to  use, give them the word or thought.  Ask 1 question at a time. Give enough time for the person to answer. Repeat the question if the person does not answer. Do things that lessen restlessness.  Provide a comfortable bed.  Have the same bedtime routine every night.  Have a regular walking and activity schedule.  Lessen naps during the day.  Do not let the person drink a lot of caffeine.  Go to events that are not overwhelming. Eat well and drink fluids.  Lessen distractions during meal times and snacks.  Avoid foods that are too hot or too cold.  Watch how the person chews and swallows. This is to make sure they do not choke. Other  Keep all vision, hearing, dental, and medical visits with the doctor.  Only give medicines as told by the doctor.  Watch the person's driving ability. Do not let the person drive if he or she cannot drive safely.  Use a program that helps find a person if they become missing. You may need to register with this program. GET HELP RIGHT AWAY IF:   A fever of 102 F (38.9 C) develops.  Confusion develops or gets worse.  Sleepiness develops or gets worse.  Staying awake is hard to do.  New behavior problems start like mood swings, aggression, and seeing things that are not there.  Problems with balance, speech, or falling develop.  Problems swallowing develop.  Any  problems of another sickness develop. MAKE SURE YOU:  Understand these instructions.  Will watch his or her condition.  Will get help right away if he or she is not doing well or gets worse. Document Released: 12/21/2007 Document Revised: 04/01/2011 Document Reviewed: 06/04/2010 [facility] Patient Information 2015 [place], [place]. This information is not intended to replace advice given to you by your health care provider. Make sure you discuss any questions you have with your health care provider.

## 2014-10-24 NOTE — ED Notes (Signed)
[facility] arrived to transport pt to ALF.

## 2014-10-24 NOTE — ED Notes (Signed)
A call was received by Nursing from a woman who identified herself as the pt's girlfriend.  This individual is the the person who had assisted the pt leave AMA from the nursing facility.  She wishes to talk to the patient when he is available and has asked if we could dial the number for the patient.  Caller gave the following number:  [phone]

## 2014-10-24 NOTE — ED Notes (Signed)
Resting quietly with eye closed. Easily arousable. Verbally responsive. Resp even and unlabored. ABC's intact. NAD noted.  

## 2014-10-24 NOTE — ED Notes (Addendum)
Awake. Verbally responsive. A/O x1 (self). Resp even and unlabored. No audible adventitious breath sounds noted. ABC's intact. Family at bedside. Pt eating meal.

## 2014-10-24 NOTE — ED Notes (Signed)
Pt awake and pulled up in bed. Pt noted removing clothing and attempting to get OOB. Pt encourage to lay down. Pt follows instructions. Will put bed alarm on bed.

## 2014-10-24 NOTE — ED Notes (Addendum)
[facility] called for patient transport.   Report called to [facility].

## 2014-10-24 NOTE — ED Notes (Signed)
Pt does not have any belongings with him. Pt arrived with only a hospital gown on.

## 2014-10-24 NOTE — Progress Notes (Signed)
CSW received consult for APS/SNF placement. Pt daughter arrived shortly after consult, who stated she received a call from pt niece who stated patient had a stroke and was in the hospital. Patient alert to self but not place. Pt believes he is in jail, asking for food, breakfast at bedside. Pt daughter states that they have been estranged for years but willing to assist with patient care and needs by signing him into a facility.    CSW spoke with Micheline Rough who had signed patient into facility, who stated that patient was complaining that they weren't feeding him, he was sitting in urine, and had brusies on him from all the falls at [employer]. Per [name], she is very upset about the entire situation and was trying to do what was best for patient. Ms. Theda Sers stated she made sure pt got home safely and pt was going to be cared for by friend. She stated the power was off but they were using candles and flashlights.   CSW spoke with Salvadore Dom who called to check on patient. Per Ms. Cook patient had told her that they were mistreating him, laying in urine all night, not feeding him. Patient had also had some falls at the facility. Per Ms. Lacinda Axon they were upset and wanting to help, so she and Shelle Iron took patient back to his apartment where they live next door and were going to assist with patient. However, after patient had come home he began to talk out of his head and was disoriented. Patient friends then called ems. Per Ms. Lacinda Axon, she realized that patient was going to need more assistance and was shocked when EMS told her that patient would need 24 hour care when they came to assess patient before brining him to the hospital.   CSW will also follow up with APS. Per communicaiton with staff APS was called from the field.    Pt accepted to return to [facility] with pt daughter signing patient in. Patient daughter and [facility] discussed that no other friends/family of patient would have privileges of  consenting for treatment or signing patient out.   Belia Heman, [place] [employer]  [employer] [phone]

## 2014-10-24 NOTE — ED Notes (Signed)
SW at bedside.

## 2014-10-25 ENCOUNTER — Non-Acute Institutional Stay (SKILLED_NURSING_FACILITY): Payer: Medicare Other | Admitting: Internal Medicine

## 2014-10-25 DIAGNOSIS — I639 Cerebral infarction, unspecified: Secondary | ICD-10-CM

## 2014-10-25 DIAGNOSIS — G40209 Localization-related (focal) (partial) symptomatic epilepsy and epileptic syndromes with complex partial seizures, not intractable, without status epilepticus: Secondary | ICD-10-CM

## 2014-10-25 DIAGNOSIS — J439 Emphysema, unspecified: Secondary | ICD-10-CM | POA: Diagnosis not present

## 2014-10-25 DIAGNOSIS — E785 Hyperlipidemia, unspecified: Secondary | ICD-10-CM

## 2014-10-25 DIAGNOSIS — F03918 Unspecified dementia, unspecified severity, with other behavioral disturbance: Secondary | ICD-10-CM

## 2014-10-25 DIAGNOSIS — F0391 Unspecified dementia with behavioral disturbance: Secondary | ICD-10-CM

## 2014-10-25 DIAGNOSIS — I10 Essential (primary) hypertension: Secondary | ICD-10-CM

## 2014-10-25 DIAGNOSIS — G629 Polyneuropathy, unspecified: Secondary | ICD-10-CM | POA: Diagnosis not present

## 2014-10-26 ENCOUNTER — Encounter: Payer: Self-pay | Admitting: Internal Medicine

## 2014-10-26 DIAGNOSIS — F0391 Unspecified dementia with behavioral disturbance: Secondary | ICD-10-CM | POA: Insufficient documentation

## 2014-10-26 DIAGNOSIS — F03918 Unspecified dementia, unspecified severity, with other behavioral disturbance: Secondary | ICD-10-CM | POA: Insufficient documentation

## 2014-10-26 NOTE — Assessment & Plan Note (Signed)
SNF - cont lyrica 25 mg BID

## 2014-10-26 NOTE — Assessment & Plan Note (Signed)
SNF - low but acceptable, on no meds;plan -cont monitor

## 2014-10-26 NOTE — Assessment & Plan Note (Addendum)
SNF - pallative had started him on zyprexa; behavoirs somewhat controlled

## 2014-10-26 NOTE — Assessment & Plan Note (Signed)
Felt to be embolic; SNF - cont OT/PT; cont plavix and ASA, statin

## 2014-10-26 NOTE — Progress Notes (Signed)
MRN: [id number] Name: SHAMARION COOTS  Sex: male Age: 81 y.o. DOB: [birth year]  [place] [facility]: [facility] Facility/Room:100 Level Of Care: SNF Provider: Inocencio Homes D Emergency Contacts: Extended Emergency Contact Information Primary Emergency Contact: Lona Kettle [place] Phone: [phone] Mobile Phone: [phone] Relation: Niece Secondary Emergency Contact: Hazeline Junker [place] Phone: [phone] Mobile Phone: [phone] Relation: None  Code Status:   Allergies: Review of patient's allergies indicates no known allergies.  Chief Complaint  Patient presents with  . Readmit To SNF    HPI: Patient is 81 y.o. male with history of dementia, lung cancer, COPD, and emphysema who presents to the Emergency Department complaining of COPD. Per EMS report, "people" signed the pt out of his nursing home today, 10/2,  AMA. 3 hours later he was found at a house that he lived in previously without electricity or his home oxygen. Pt was evaluated in ED and found to have no acute medical problems. Arranegments were made for pt to be readmitted to SNF. While at SNF pt will be followed for dementia with behavoirs, tx with zyprexa, complex partial eizures . tx with dilantin and HLD, tx withlipitor.  Past Medical History  Diagnosis Date  . Hypertension   . COPD (chronic obstructive pulmonary disease) ([place])   . Hemorrhoids     external  . Complex partial seizure ([place])     from a remote accident  . GERD (gastroesophageal reflux disease)   . Emphysema   . HOH (hard of hearing)   . Pneumonia   . Seizures ([place])   . Headache(784.0)   . Non-small cell carcinoma of lung ([place]) dx'd 2006    stage 3  . Lung cancer ([place])   . Stroke ([place])   . Dementia     Past Surgical History  Procedure Laterality Date  . Inner ear surgery    . Toe amputation Left   . Tonsillectomy    . Amputation Left 04/16/2013    Procedure: AMPUTATION RAY;  Surgeon: Newt Minion, MD;  Location: [facility];  Service: Orthopedics;  Laterality: Left;  Left Foot 4th and 5th Ray Amputation  . Lipoma excision Left 09/08/2013    Procedure: EXCISION OF LEFT PINNA SKIN CANCER WITH FROZEN SECTION ;  Surgeon: Izora Gala, MD;  Location: [facility];  Service: ENT;  Laterality: Left;      Medication List       This list is accurate as of: 10/25/14 11:59 PM.  Always use your most recent med list.               acetaminophen 325 MG tablet  Commonly known as:  TYLENOL  Take 2 tablets (650 mg total) by mouth every 6 (six) hours as needed for mild pain (or Fever >/= 101).     acetaminophen 325 MG tablet  Commonly known as:  TYLENOL  Take 2 tablets (650 mg total) by mouth 3 (three) times daily.     amoxicillin-clavulanate 875-125 MG tablet  Commonly known as:  AUGMENTIN  Take 1 tablet by mouth every 12 (twelve) hours. For 15 more days     aspirin 81 MG EC tablet  Take 1 tablet (81 mg total) by mouth daily.     atorvastatin 20 MG tablet  Commonly known as:  LIPITOR  Take 1 tablet (20 mg total) by mouth daily at 6 PM.     clopidogrel 75 MG tablet  Commonly known as:  PLAVIX  Take  1 tablet (75 mg total) by mouth daily.     feeding supplement (ENSURE ENLIVE) Liqd  Take 237 mLs by mouth 3 (three) times daily between meals.     Fluticasone-Salmeterol 250-50 MCG/DOSE Aepb  Commonly known as:  ADVAIR  Inhale 1 puff into the lungs 2 (two) times daily as needed (shortness of breath).     ipratropium-albuterol 0.5-2.5 (3) MG/3ML Soln  Commonly known as:  DUONEB  Take 3 mLs by nebulization every 4 (four) hours as needed (wheezing).     magnesium hydroxide 400 MG/5ML suspension  Commonly known as:  MILK OF MAGNESIA  Take 30 mLs by mouth daily as needed for mild constipation.     nicotine 21 mg/24hr patch  Commonly known as:  NICODERM CQ - dosed in mg/24 hours  Place 1 patch (21 mg total) onto the skin daily.     OLANZapine 5 MG tablet  Commonly known as:  ZYPREXA  Take 1  tablet (5 mg total) by mouth at bedtime.     oxyCODONE 5 MG immediate release tablet  Commonly known as:  Oxy IR/ROXICODONE  Take 0.5 tablets (2.5 mg total) by mouth 3 (three) times daily.     phenytoin 300 MG ER capsule  Commonly known as:  DILANTIN  Take 1 capsule (300 mg total) by mouth daily.     pregabalin 25 MG capsule  Commonly known as:  LYRICA  Take 1 capsule (25 mg total) by mouth 2 (two) times daily.     PROAIR HFA 108 (90 BASE) MCG/ACT inhaler  Generic drug:  albuterol  INHALE 2 PUFFS EVERY 4-6 HOURS AS NEEDED FOR COUGH OR WHEEZING        No orders of the defined types were placed in this encounter.    Immunization History  Administered Date(s) Administered  . Influenza Split 01/10/2011  . Influenza Whole 12/03/2006  . Pneumococcal Polysaccharide-23 12/03/2006  . Td 07/13/2011  . Tdap 02/09/2012, 10/13/2014, 10/24/2014    Social History  Substance Use Topics  . Smoking status: Current Every Day Smoker -- 1.50 packs/day for 60 years    Types: Cigarettes  . Smokeless tobacco: Never Used  . Alcohol Use: No    Family history is + HF  Review of Systems  DATA OBTAINED: from  Nurse- back to usual self GENERAL:  no fevers, fatigue, appetite changes SKIN: No itching, rash or wounds EYES: No eye pain, redness, discharge EARS: No earache, tinnitus, change in hearing NOSE: No congestion, drainage or bleeding  MOUTH/THROAT: No mouth or tooth pain, No sore throat RESPIRATORY: No cough, wheezing, SOB CARDIAC: No chest pain, palpitations, lower extremity edema  GI: No abdominal pain, No N/V/D or constipation, No heartburn or reflux  GU: No dysuria, frequency or urgency, or incontinence  MUSCULOSKELETAL: No unrelieved bone/joint pain NEUROLOGIC: No headache, dizziness or focal weakness PSYCHIATRIC: No c/o anxiety or sadness   Filed Vitals:   10/26/14 0852  BP: 96/57  Pulse: 95  Temp: 97.9 F (36.6 C)  Resp: 18    SpO2 Readings from Last 1 Encounters:   10/24/14 99%        Physical Exam  GENERAL APPEARANCE: Alert, nonconversant,  No acute distress.  SKIN: No diaphoresis rash HEAD: Normocephalic, atraumatic  EYES: Conjunctiva/lids clear. Pupils round, reactive. EOMs intact.  EARS: External exam WNL, canals clear. Hearing grossly normal.  NOSE: No deformity or discharge.  MOUTH/THROAT: Lips w/o lesions  RESPIRATORY: Breathing is even, unlabored. Lung sounds are clear   CARDIOVASCULAR: Heart RRR  no murmurs, rubs or gallops. No peripheral edema.   GASTROINTESTINAL: Abdomen is soft, non-tender, not distended w/ normal bowel sounds. GENITOURINARY: Bladder non tender, not distended  MUSCULOSKELETAL: No abnormal joints or musculature NEUROLOGIC:  Cranial nerves 2-12 grossly intact; L side weakness PSYCHIATRIC: dementia, chronic confusion, no behavioral issues  Patient Active Problem List   Diagnosis Date Noted  . Dementia with behavioral disturbance 10/26/2014  . Hyperlipidemia 10/23/2014  . Neuropathy ([place]) 10/23/2014  . Palliative care encounter   . Stroke ([place])   . Complex partial seizure ([place]) 10/14/2014  . Altered mental status   . Stroke with cerebral ischemia ([place])   . Sepsis ([place])   . Faintness   . Weakness   . Protein-calorie malnutrition, severe ([place]) 10/12/2014  . Cavitary pneumonia 10/12/2014  . Lung cancer ([place])   . CAP (community acquired pneumonia) 10/10/2014  . Shortness of breath 10/10/2014  . FTT (failure to thrive) in adult 10/10/2014  . CVA (cerebral infarction) 06/10/2013  . Acute CVA (cerebrovascular accident) ([place]) 06/10/2013  . Accidental drug overdose--narcotics 04/01/2013  . Chronic osteomyelitis of left foot s/p toe amputation and ischemic changes 04/01/2013  . Acute encephalopathy 04/01/2013  . Falls 07/15/2012  . Mass of right ear canal 07/15/2012  . Dysphagia 01/16/2012  . Cellulitis of left lower extremity 01/16/2012  . PERSONAL HISTORY, VENOUS THROMBOSIS AND EMBOLISM 03/23/2008  . Acute  thromboembolism of deep veins of lower extremity ([place]) 12/03/2006  . INSOMNIA 12/03/2006  . TOBACCO ABUSE 01/02/2006  . Essential hypertension 01/02/2006  . EXTERNAL HEMORRHOIDS 01/02/2006  . COPD, MILD 01/02/2006  . GASTROESOPHAGEAL REFLUX DISEASE 01/02/2006  . BURSITIS, SHOULDER 01/02/2006  . Complex partial seizures ([place]) 01/02/2006  . ALCOHOL ABUSE, HX OF 01/02/2006  . CARCINOMA, LUNG, SQUAMOUS CELL 02/07/2004    CBC    Component Value Date/Time   WBC 8.9 10/24/2014 0023   WBC 5.6 12/25/2012 0832   RBC 4.50 10/24/2014 0023   RBC 5.08 12/25/2012 0832   HGB 13.3 10/24/2014 0031   HGB 15.2 12/25/2012 0832   HCT 39.0 10/24/2014 0031   HCT 45.8 12/25/2012 0832   PLT 406* 10/24/2014 0023   PLT 212 12/25/2012 0832   MCV 82.4 10/24/2014 0023   MCV 90.1 12/25/2012 0832   LYMPHSABS 0.4* 10/24/2014 0023   LYMPHSABS 0.9 12/25/2012 0832   MONOABS 0.9 10/24/2014 0023   MONOABS 0.7 12/25/2012 0832   EOSABS 0.1 10/24/2014 0023   EOSABS 0.3 12/25/2012 0832   BASOSABS 0.0 10/24/2014 0023   BASOSABS 0.0 12/25/2012 0832    CMP     Component Value Date/Time   NA 137 10/24/2014 0031   NA 140 12/25/2012 0832   NA 142 02/15/2011 0956   K 3.7 10/24/2014 0031   K 4.7 12/25/2012 0832   K 4.4 02/15/2011 0956   CL 97* 10/24/2014 0031   CL 100 04/16/2012 0832   CL 99 02/15/2011 0956   CO2 22 10/16/2014 0613   CO2 27 12/25/2012 0832   CO2 29 02/15/2011 0956   GLUCOSE 100* 10/24/2014 0031   GLUCOSE 123 12/25/2012 0832   GLUCOSE 113* 04/16/2012 0832   GLUCOSE 137* 02/15/2011 0956   BUN 12 10/24/2014 0031   BUN 7.5 12/25/2012 0832   BUN 8 02/15/2011 0956   CREATININE 0.50* 10/24/2014 0031   CREATININE 0.8 12/25/2012 0832   CREATININE 0.5* 02/15/2011 0956   CALCIUM 8.1* 10/16/2014 0613   CALCIUM 9.3 12/25/2012 0832   CALCIUM 8.8 02/15/2011 0956   PROT 5.7* 10/13/2014 2000  PROT 6.9 12/25/2012 0832   PROT 7.4 02/15/2011 0956   ALBUMIN 2.0* 10/13/2014 2000   ALBUMIN 3.6  12/25/2012 0832   AST 23 10/13/2014 2000   AST 14 12/25/2012 0832   AST 18 02/15/2011 0956   ALT 16* 10/13/2014 2000   ALT 13 12/25/2012 0832   ALT 19 02/15/2011 0956   ALKPHOS 97 10/13/2014 2000   ALKPHOS 118 12/25/2012 0832   ALKPHOS 109* 02/15/2011 0956   BILITOT 0.5 10/13/2014 2000   BILITOT 0.32 12/25/2012 0832   BILITOT 0.50 02/15/2011 0956   GFRNONAA >60 10/16/2014 0613   GFRAA >60 10/16/2014 0613    Lab Results  Component Value Date   HGBA1C 6.1* 10/14/2014     Dg Chest 2 View  10/24/2014   CLINICAL DATA:  Acute onset of shortness of breath. Initial encounter.  EXAM: CHEST  2 VIEW  COMPARISON:  Chest radiograph performed 10/19/2014  FINDINGS: Chronic right-sided post treatment changes and airspace opacities are grossly stable, without definite superimposed airspace consolidation. This reflects prior treated lung cancer and associated fibrotic change. The left lung appears relatively clear. Blunting of the right costophrenic angle is grossly stable. No significant pleural effusion or pneumothorax is seen.  The cardiomediastinal silhouette is normal in size. No acute osseous abnormalities are identified.  IMPRESSION: Chronic right-sided post treatment changes and fibrotic change. No acute focal airspace consolidation seen.   Electronically Signed   By: Garald Balding M.D.   On: 10/24/2014 02:12   Ct Head Wo Contrast  10/24/2014   CLINICAL DATA:  Question of fall. Concern for head injury. Initial encounter.  EXAM: CT HEAD WITHOUT CONTRAST  TECHNIQUE: Contiguous axial images were obtained from the base of the skull through the vertex without intravenous contrast.  COMPARISON:  CT of the head performed 10/19/2014  FINDINGS: There is no evidence of acute infarction, mass lesion, or intra- or extra-axial hemorrhage on CT.  Prominence of the ventricles and sulci reflects moderate cortical volume loss. Mild cerebellar atrophy is noted. Scattered periventricular and subcortical white matter  change likely reflects small vessel ischemic microangiopathy. A small chronic infarct is noted at the right frontal lobe.  The brainstem and fourth ventricle are within normal limits. The basal ganglia are unremarkable in appearance. No mass effect or midline shift is seen.  There is no evidence of fracture; visualized osseous structures are unremarkable in appearance. The orbits are within normal limits. The paranasal sinuses and mastoid air cells are well-aerated. No significant soft tissue abnormalities are seen.  IMPRESSION: 1. No evidence of traumatic intracranial injury or fracture. 2. Moderate cortical volume loss and scattered small vessel ischemic microangiopathy. Small chronic infarct at the right frontal lobe.   Electronically Signed   By: Garald Balding M.D.   On: 10/24/2014 02:19    Not all labs, radiology exams or other studies done during hospitalization come through on my EPIC note; however they are reviewed by me.    Assessment and Plan  Acute CVA (cerebrovascular accident) Felt to be embolic; SNF - cont OT/PT; cont plavix and ASA, statin  Essential hypertension SNF - low but acceptable, on no meds;plan -cont monitor  Complex partial seizures (HCC) SNF - cont dilantin  Neuropathy (HCC) SNF - cont lyrica 25 mg BID  Hyperlipidemia SNF - cont lipitor 20 mg daily; LD: 85, HDL 45  Dementia with behavioral disturbance SNF - pallative had started him on zyprexa; behavoirs somewhat controlled   Time spent > 35 min;> 50% of time with patient  was spent reviewing records, labs, tests and studies, counseling and developing plan of care  Hennie Duos, MD

## 2014-10-26 NOTE — Assessment & Plan Note (Signed)
SNF - cont lipitor 20 mg daily; LD: 85, HDL 45

## 2014-10-26 NOTE — Assessment & Plan Note (Signed)
SNF - cont dilantin

## 2014-10-26 NOTE — Assessment & Plan Note (Signed)
SNF - cont prn nebs

## 2014-11-09 ENCOUNTER — Ambulatory Visit (INDEPENDENT_AMBULATORY_CARE_PROVIDER_SITE_OTHER): Payer: Medicare Other | Admitting: Adult Health

## 2014-11-09 ENCOUNTER — Encounter: Payer: Self-pay | Admitting: Adult Health

## 2014-11-09 VITALS — BP 130/62 | HR 90 | Temp 98.4°F | Ht 66.0 in

## 2014-11-09 DIAGNOSIS — I639 Cerebral infarction, unspecified: Secondary | ICD-10-CM

## 2014-11-09 DIAGNOSIS — C3400 Malignant neoplasm of unspecified main bronchus: Secondary | ICD-10-CM | POA: Diagnosis not present

## 2014-11-09 DIAGNOSIS — J189 Pneumonia, unspecified organism: Secondary | ICD-10-CM

## 2014-11-09 DIAGNOSIS — J984 Other disorders of lung: Secondary | ICD-10-CM

## 2014-11-09 NOTE — Assessment & Plan Note (Signed)
Cavitary PNA tx w/ prolonged abx with augmentin x 4 weeks  Check cxr today  No further abx at this time.  follow up in 6 weeks with cxr

## 2014-11-09 NOTE — Patient Instructions (Addendum)
Finish Augmentin as planned  Chest xray today.  Follow up Dr. Vaughan Browner in 6 weeks and As needed

## 2014-11-09 NOTE — Assessment & Plan Note (Signed)
Hx of lung cancer in past , followed by oncology  Consider repeat CT chest going forward.  Appears pt would not be candidate for aggressive treatment.

## 2014-11-09 NOTE — Progress Notes (Signed)
   Subjective:    Patient ID: Paul [name], male    DOB: [birth year], 81 y.o.   MRN: [id number]  HPI 81 yo smoker with severe dementia ,  COPD and previous lung cancer seen in hospital for consult cavitary PNA /?Bullitis   11/09/2014 [facility] follow up  Pt was recently re-admitted to hospital for severe weakness, fall , 30lb weight loss. He was found to have a CVA ,, cavitary PNA. He had left sided weakness, MRI showed a right sided CVA. Seen by neurology. Unfortunately due to his dementia, multiple falls he was  Considered not a anticoagulation candidate.  He was discharged to SNF. Was seen by psych due to dementia/delirium , changed to zyprexa,  He does have a hx of lung cancer , . Prior to this admit was in hospital for CAP . CT showed loculated fluid in apical area with cavitray lesions with air fluid levels. RUL nodule was increased in size r/t previous lung cancer. He was treated for infected bullae w/ suspected recurrent lung cancer. He was discharged on 4 weeks of Augmentin . He appears to have few days left according to his SNF MAR.   Pt presents to office today by himself in wc , he is very confused . Difficult to examine. Says he has eye infection and is now on eye drops. Appears Cipro otic drops were started. Unable to reach nurse at [facility] to discuss. No family available. Pt says cough is better. He requests cigarette multiple times and combative at times.      Review of Systems  Constitutional:   No  weight loss, night sweats,  Fevers, chills,  +fatigue, or  lassitude. HEENT:   No headaches,  Difficulty swallowing,  Tooth/dental problems, or  Sore throat,                No sneezing, itching, ear ache, nasal congestion, post nasal drip,  +eye drainage   CV:  No chest pain,  Orthopnea, PND, swelling in lower extremities, anasarca, dizziness, palpitations, syncope.   GI  No heartburn, indigestion, abdominal pain, nausea, vomiting, diarrhea, change in bowel habits, loss of  appetite, bloody stools.   Resp:  .  No chest wall deformity  Skin: no rash or lesions.  GU: no dysuria, change in color of urine, no urgency or frequency.  No flank pain, no hematuria   MS:  No joint pain or swelling.  No decreased range of motion.  No back pain.  Psych:  No change in mood or affect. No depression or anxiety.  No memory loss.          Objective:   Physical Exam GEN: A/Ox3; pleasant , NAD, frail and eldelry in wc   HEENT:  [place]/AT,   , THROAT-clear,   Significant matting on left eye , unable to exam , pt combative with exam.   NECK:  Supple w/ fair ROM;    RESP  Decreased BS in basesno accessory muscle use, no dullness to percussion  CARD:  RRR, no m/r/g  , no peripheral edema, pulses intact, no cyanosis or clubbing.  GI:   Soft & nt; nml bowel sounds;    Musco: Warm bil, no deformities or joint swelling noted.   Neuro:confused , combative   Skin: Warm, multiple brusies, scabs, and scraps all over face and arms       Assessment & Plan:

## 2014-11-10 ENCOUNTER — Non-Acute Institutional Stay (SKILLED_NURSING_FACILITY): Payer: Medicare Other | Admitting: Internal Medicine

## 2014-11-10 ENCOUNTER — Encounter (HOSPITAL_COMMUNITY): Payer: Self-pay | Admitting: Emergency Medicine

## 2014-11-10 ENCOUNTER — Telehealth: Payer: Self-pay | Admitting: Adult Health

## 2014-11-10 ENCOUNTER — Emergency Department (HOSPITAL_COMMUNITY)
Admission: EM | Admit: 2014-11-10 | Discharge: 2014-11-10 | Disposition: A | Discharge status: Other Acute Inpatient Hospital | Payer: Medicare Other | Attending: Emergency Medicine | Admitting: Emergency Medicine

## 2014-11-10 DIAGNOSIS — Z8673 Personal history of transient ischemic attack (TIA), and cerebral infarction without residual deficits: Secondary | ICD-10-CM | POA: Diagnosis not present

## 2014-11-10 DIAGNOSIS — J449 Chronic obstructive pulmonary disease, unspecified: Secondary | ICD-10-CM | POA: Insufficient documentation

## 2014-11-10 DIAGNOSIS — H44002 Unspecified purulent endophthalmitis, left eye: Secondary | ICD-10-CM

## 2014-11-10 DIAGNOSIS — Z7902 Long term (current) use of antithrombotics/antiplatelets: Secondary | ICD-10-CM | POA: Insufficient documentation

## 2014-11-10 DIAGNOSIS — H109 Unspecified conjunctivitis: Secondary | ICD-10-CM

## 2014-11-10 DIAGNOSIS — Z7951 Long term (current) use of inhaled steroids: Secondary | ICD-10-CM | POA: Diagnosis not present

## 2014-11-10 DIAGNOSIS — I10 Essential (primary) hypertension: Secondary | ICD-10-CM | POA: Diagnosis not present

## 2014-11-10 DIAGNOSIS — J189 Pneumonia, unspecified organism: Secondary | ICD-10-CM

## 2014-11-10 DIAGNOSIS — H9190 Unspecified hearing loss, unspecified ear: Secondary | ICD-10-CM | POA: Diagnosis not present

## 2014-11-10 DIAGNOSIS — Z79899 Other long term (current) drug therapy: Secondary | ICD-10-CM | POA: Insufficient documentation

## 2014-11-10 DIAGNOSIS — F0390 Unspecified dementia without behavioral disturbance: Secondary | ICD-10-CM | POA: Insufficient documentation

## 2014-11-10 DIAGNOSIS — Z85118 Personal history of other malignant neoplasm of bronchus and lung: Secondary | ICD-10-CM | POA: Insufficient documentation

## 2014-11-10 DIAGNOSIS — Z7982 Long term (current) use of aspirin: Secondary | ICD-10-CM | POA: Diagnosis not present

## 2014-11-10 DIAGNOSIS — Z720 Tobacco use: Secondary | ICD-10-CM | POA: Insufficient documentation

## 2014-11-10 DIAGNOSIS — Z8719 Personal history of other diseases of the digestive system: Secondary | ICD-10-CM | POA: Insufficient documentation

## 2014-11-10 DIAGNOSIS — Z8701 Personal history of pneumonia (recurrent): Secondary | ICD-10-CM | POA: Diagnosis not present

## 2014-11-10 DIAGNOSIS — H578 Other specified disorders of eye and adnexa: Secondary | ICD-10-CM | POA: Diagnosis present

## 2014-11-10 NOTE — Consult Note (Signed)
OPHTHALMOLOGY CONSULT NOTE  Date: 11/10/14 Time: 5:28 PM  Patient Name: Paul [name]  DOB: 10/01/1930 MRN: [id number]  Reason for Consult:  Eye infection  HPI:  This is a 81 y.o. old demented male with a 1.5 week history of "eye infection"  In the left eye. Patient was started on ciprofloxacin drops 2 days ago with continued worsening of his infection. There are no family members present to further discuss the history.    Prior to Admission medications   Medication Sig Start Date End Date Taking? Authorizing Provider  acetaminophen (TYLENOL) 325 MG tablet Take 2 tablets (650 mg total) by mouth every 6 (six) hours as needed for mild pain (or Fever >/= 101). Patient taking differently: Take 650 mg by mouth daily as needed for mild pain (or Fever >/= 101).  10/18/14   Bonnielee Haff, MD  acetaminophen (TYLENOL) 325 MG tablet Take 2 tablets (650 mg total) by mouth 3 (three) times daily. 10/18/14   Bonnielee Haff, MD  aspirin EC 81 MG EC tablet Take 1 tablet (81 mg total) by mouth daily. 10/18/14   Bonnielee Haff, MD  atorvastatin (LIPITOR) 20 MG tablet Take 1 tablet (20 mg total) by mouth daily at 6 PM. 10/18/14   Bonnielee Haff, MD  clopidogrel (PLAVIX) 75 MG tablet Take 1 tablet (75 mg total) by mouth daily. 10/18/14   Bonnielee Haff, MD  feeding supplement, ENSURE ENLIVE, (ENSURE ENLIVE) LIQD Take 237 mLs by mouth 3 (three) times daily between meals. 10/18/14   Bonnielee Haff, MD  Fluticasone-Salmeterol (ADVAIR) 250-50 MCG/DOSE AEPB Inhale 1 puff into the lungs 2 (two) times daily as needed (shortness of breath).     Historical Provider, MD  ipratropium-albuterol (DUONEB) 0.5-2.5 (3) MG/3ML SOLN Take 3 mLs by nebulization every 4 (four) hours as needed (wheezing). 10/18/14 10/18/15  Bonnielee Haff, MD  magnesium hydroxide (MILK OF MAGNESIA) 400 MG/5ML suspension Take 30 mLs by mouth daily as needed for mild constipation.    Historical Provider, MD  nicotine (NICODERM CQ - DOSED IN MG/24 HOURS) 21  mg/24hr patch Place 1 patch (21 mg total) onto the skin daily. 10/18/14   Bonnielee Haff, MD  OLANZapine (ZYPREXA) 5 MG tablet Take 1 tablet (5 mg total) by mouth at bedtime. 10/18/14   Acquanetta Chain, DO  oxyCODONE (OXY IR/ROXICODONE) 5 MG immediate release tablet Take 0.5 tablets (2.5 mg total) by mouth 3 (three) times daily. 10/18/14   Acquanetta Chain, DO  phenytoin (DILANTIN) 300 MG ER capsule Take 1 capsule (300 mg total) by mouth daily. 10/18/14   Bonnielee Haff, MD  pregabalin (LYRICA) 25 MG capsule Take 1 capsule (25 mg total) by mouth 2 (two) times daily. 10/18/14   Acquanetta Chain, DO  PROAIR HFA 108 (90 BASE) MCG/ACT inhaler INHALE 2 PUFFS EVERY 4-6 HOURS AS NEEDED FOR COUGH OR WHEEZING 08/29/14   Historical Provider, MD    Past Medical History  Diagnosis Date  . Hypertension   . COPD (chronic obstructive pulmonary disease) ([place])   . Hemorrhoids     external  . Complex partial seizure ([place])     from a remote accident  . GERD (gastroesophageal reflux disease)   . Emphysema   . HOH (hard of hearing)   . Pneumonia   . Seizures ([place])   . Headache(784.0)   . Non-small cell carcinoma of lung ([place]) dx'd 2006    stage 3  . Lung cancer ([place])   . Stroke ([place])   . Dementia  family history includes Heart failure in his brother.  Social History   Occupational History  . Not on file.   Social History Main Topics  . Smoking status: Current Every Day Smoker -- 1.50 packs/day for 60 years    Types: Cigarettes  . Smokeless tobacco: Never Used  . Alcohol Use: No  . Drug Use: No  . Sexual Activity: No    No Known Allergies  ROS: Positive as above, otherwise negative.  EXAM:  Mental Status: Alert, not oriented  Base Exam: Right Eye Left Eye  Visual Acuity (At near) 20/200  LP  IOP ([place])  14  Pupillary Exam No RAPD Trace APD  Motility Round reactive Poor view given corneal opacity  Confrontation VF     Anterior Segment Exam    Lids/Lashes WNL Mild  erythema  Conjuctiva White and Quiet 3+ injection  Cornea Clear Near total opacification with immune ring  Anterior Chamber Deep and Quiet Hypopion  Iris Round, Reactive Round, Reactive  Lens  Clear  Vitreous  B-scan demonstrates numerous opacifications   Poster Segment Exam    Disc  No view  CD ratio    Macula    Vessels    Periphery     Radiographic Studies Reviewed:  None  Assessment and Recommendation: 1. Endophthalmitis: Suspect due to progression of corneal ulcer. Poor visual prognosis discussed, will refer to [facility] likely for tap and Inject. Discussed case with Tanya Nones, MD.   Please call with any questions.  Jola Schmidt MD [facility] Ophthalmology [phone]

## 2014-11-10 NOTE — ED Notes (Signed)
Pt has to be redirected to remain in bed occasionally. Pt shoes are on, call bell within reach, gets aggressive at times with this writer when attempting to obtain vital signs. In NAD, will continue to monitor.

## 2014-11-10 NOTE — Progress Notes (Signed)
Patient ID: [name], male   DOB: 1930/10/13, 81 y.o.   MRN: [id number] MRN: [id number] Name: Paul [name]  Sex: male Age: 81 y.o. DOB: [birth year]  [place] [facility]: [facility] Facility/Room:100 Level Of Care: SNF Provider: Wille Celeste Emergency Contacts: Extended Emergency Contact Information Primary Emergency Contact: Irby,Tracy Address: [address]          [place], [place] [address] Johnnette Litter of [place] Phone: [phone] Mobile Phone: [phone] Relation: Daughter Secondary Emergency Contact: Leitha Bleak [place] of [place] Phone: [phone] Mobile Phone: [phone] Relation: Son  Code Status:   Allergies: Review of patient's allergies indicates no known allergies.  Chief Complaint  Patient presents with  . Acute Visit   secondary to left eye issues question conjunctivitis-follow-up pneumonia  HPI: Patient is 81 y.o. male with history of dementia, lung cancer, COPD, and emphysema who presented to the Emergency Department complaining of COPD. Per EMS report, "people" signed the pt out of his nursing home t, 10/2,  AMA. 3 hours later he was found at a house that he lived in previously without electricity or his home oxygen. Pt was evaluated in ED and found to have no acute medical problems. Arranegments were made for pt to be readmitted to SNF. While at SNF pt e followed for dementia with behavoirs, tx with zyprexa, complex partial eizures . tx with dilantin and HLD, tx withlipitor  He does have a history of a right upper lobe nodule he was actually seen by pulmonology yesterday he also has a history of cavitary pneumonia and is completing a prolonged course of Augmentin.  He did have an x-ray that was ordered by pulmonology yesterday which shows multifocal pneumonia.  Most acute issue today is what appears to be worsening conjunctivitis of his left eye-he was started on Cipro eyedrops yesterday-however today nursing staff reports his left eye has a  progressing film over the conjunctivae--patient is a poor historian with dementia and cannot really get any significant review of systems his vital signs are stable his right eye appears to be baseline.  Respiratory wise he appears to be stable he does have duo nebs as needed and Advair.  .  Past Medical History  Diagnosis Date  . Hypertension   . COPD (chronic obstructive pulmonary disease) ([place])   . Hemorrhoids     external  . Complex partial seizure ([place])     from a remote accident  . GERD (gastroesophageal reflux disease)   . Emphysema   . HOH (hard of hearing)   . Pneumonia   . Seizures ([place])   . Headache(784.0)   . Non-small cell carcinoma of lung ([place]) dx'd 2006    stage 3  . Lung cancer ([place])   . Stroke ([place])   . Dementia     Past Surgical History  Procedure Laterality Date  . Inner ear surgery    . Toe amputation Left   . Tonsillectomy    . Amputation Left 04/16/2013    Procedure: AMPUTATION RAY;  Surgeon: Newt Minion, MD;  Location: [facility];  Service: Orthopedics;  Laterality: Left;  Left Foot 4th and 5th Ray Amputation  . Lipoma excision Left 09/08/2013    Procedure: EXCISION OF LEFT PINNA SKIN CANCER WITH FROZEN SECTION ;  Surgeon: Izora Gala, MD;  Location: [facility];  Service: ENT;  Laterality: Left;      Medication List    Notice    This visit is on the same day as an  admission, and a visit start time could not be determined. If the visit took place after discharge, manually review the med list with the patient.      No orders of the defined types were placed in this encounter.    Immunization History  Administered Date(s) Administered  . Influenza Split 01/10/2011  . Influenza Whole 12/03/2006  . Pneumococcal Polysaccharide-23 12/03/2006  . Td 07/13/2011  . Tdap 02/09/2012, 10/13/2014, 10/24/2014    Social History  Substance Use Topics  . Smoking status: Current Every Day Smoker -- 1.50 packs/day for 60 years    Types: Cigarettes  . Smokeless  tobacco: Never Used  . Alcohol Use: No    Family history is + HF  Review of Systems  DATA OBTAINED: from  Nurse- GENERAL:  no fevers, fatigue, appetite changes SKIN: No itching, rash or wounds EYES: Again difficult to fully tell but he does not appear to have eye pain-again he does have a progressing film over his left eye EARS: No earache, tinnitus, change in hearing NOSE: No congestion, drainage or bleeding  MOUTH/THROAT: No mouth or tooth pain, No sore throat RESPIRATORY: No cough, wheezing, SOB CARDIAC: No chest pain, palpitations, lower extremity edema  GI: No abdominal pain, No N/V/D or constipation, No heartburn or reflux  GU: No dysuria, frequency or urgency, or incontinence  MUSCULOSKELETAL: No unrelieved bone/joint pain NEUROLOGIC: No headache, dizziness or focal weakness PSYCHIATRIC: No c/o anxiety or sadness   Filed Vitals:   11/19/14 1553  BP: 100/62  Pulse: 75  Temp: 97 F (36.1 C)  Resp: 19    SpO2 Readings from Last 1 Encounters:  11/10/14 98%        Physical Exam  GENERAL APPEARANCE: Was sleeping but easily arousable, nonconversant,  No acute distress.  SKIN: No diaphoresis rash HEAD: Normocephalic, atraumatic  EYES: There is a thin grayish white film covering I would say at least half of his left lower eye conjunctiva- --per nursing this has progressed actually since this morning-he does not appear to be uncomfortable-right eye appears to be essentially unremarkable  EARS: External exam WNL, canals clear. Hearing grossly normal.  NOSE: No deformity or discharge.  MOUTH/THROAT: Lips w/o lesions  oropharynx appears to be slightly dry RESPIRATORY: Breathing is even, unlabored. Lung sounds are clear--shallow   CARDIOVASCULAR: Heart RRR no murmurs, rubs or gallops. No peripheral edema.   GASTROINTESTINAL: Abdomen is soft, non-tender, not distended w/ normal bowel sounds  MUSCULOSKELETAL: No abnormal joints or musculaturevery frail NEUROLOGIC:  Cranial  nerves 2-12 grossly intact; L side weakness PSYCHIATRIC: dementia, chronic confusion, no behavioral issues  Patient Active Problem List   Diagnosis Date Noted  . Conjunctivitis 11/19/2014  . Dementia with behavioral disturbance 10/26/2014  . Hyperlipidemia 10/23/2014  . Neuropathy ([place]) 10/23/2014  . Palliative care encounter   . Stroke ([place])   . Complex partial seizure ([place]) 10/14/2014  . Altered mental status   . Stroke with cerebral ischemia ([place])   . Sepsis ([place])   . Faintness   . Weakness   . Protein-calorie malnutrition, severe ([place]) 10/12/2014  . Cavitary pneumonia 10/12/2014  . Lung cancer ([place])   . CAP (community acquired pneumonia) 10/10/2014  . Shortness of breath 10/10/2014  . FTT (failure to thrive) in adult 10/10/2014  . CVA (cerebral infarction) 06/10/2013  . Acute CVA (cerebrovascular accident) ([place]) 06/10/2013  . Accidental drug overdose--narcotics 04/01/2013  . Chronic osteomyelitis of left foot s/p toe amputation and ischemic changes 04/01/2013  . Acute encephalopathy 04/01/2013  .  Falls 07/15/2012  . Mass of right ear canal 07/15/2012  . Dysphagia 01/16/2012  . Cellulitis of left lower extremity 01/16/2012  . PERSONAL HISTORY, VENOUS THROMBOSIS AND EMBOLISM 03/23/2008  . Acute thromboembolism of deep veins of lower extremity ([place]) 12/03/2006  . INSOMNIA 12/03/2006  . TOBACCO ABUSE 01/02/2006  . Essential hypertension 01/02/2006  . EXTERNAL HEMORRHOIDS 01/02/2006  . COPD (chronic obstructive pulmonary disease) ([place]) 01/02/2006  . GASTROESOPHAGEAL REFLUX DISEASE 01/02/2006  . BURSITIS, SHOULDER 01/02/2006  . Complex partial seizures ([place]) 01/02/2006  . ALCOHOL ABUSE, HX OF 01/02/2006  . CARCINOMA, LUNG, SQUAMOUS CELL 02/07/2004    CBC    Component Value Date/Time   WBC 8.9 10/24/2014 0023   WBC 5.6 12/25/2012 0832   RBC 4.50 10/24/2014 0023   RBC 5.08 12/25/2012 0832   HGB 13.3 10/24/2014 0031   HGB 15.2 12/25/2012 0832   HCT 39.0 10/24/2014  0031   HCT 45.8 12/25/2012 0832   PLT 406* 10/24/2014 0023   PLT 212 12/25/2012 0832   MCV 82.4 10/24/2014 0023   MCV 90.1 12/25/2012 0832   LYMPHSABS 0.4* 10/24/2014 0023   LYMPHSABS 0.9 12/25/2012 0832   MONOABS 0.9 10/24/2014 0023   MONOABS 0.7 12/25/2012 0832   EOSABS 0.1 10/24/2014 0023   EOSABS 0.3 12/25/2012 0832   BASOSABS 0.0 10/24/2014 0023   BASOSABS 0.0 12/25/2012 0832    CMP     Component Value Date/Time   NA 137 10/24/2014 0031   NA 140 12/25/2012 0832   NA 142 02/15/2011 0956   K 3.7 10/24/2014 0031   K 4.7 12/25/2012 0832   K 4.4 02/15/2011 0956   CL 97* 10/24/2014 0031   CL 100 04/16/2012 0832   CL 99 02/15/2011 0956   CO2 22 10/16/2014 0613   CO2 27 12/25/2012 0832   CO2 29 02/15/2011 0956   GLUCOSE 100* 10/24/2014 0031   GLUCOSE 123 12/25/2012 0832   GLUCOSE 113* 04/16/2012 0832   GLUCOSE 137* 02/15/2011 0956   BUN 12 10/24/2014 0031   BUN 7.5 12/25/2012 0832   BUN 8 02/15/2011 0956   CREATININE 0.50* 10/24/2014 0031   CREATININE 0.8 12/25/2012 0832   CREATININE 0.5* 02/15/2011 0956   CALCIUM 8.1* 10/16/2014 0613   CALCIUM 9.3 12/25/2012 0832   CALCIUM 8.8 02/15/2011 0956   PROT 5.7* 10/13/2014 2000   PROT 6.9 12/25/2012 0832   PROT 7.4 02/15/2011 0956   ALBUMIN 2.0* 10/13/2014 2000   ALBUMIN 3.6 12/25/2012 0832   ALBUMIN 3.7 02/15/2011 0956   AST 23 10/13/2014 2000   AST 14 12/25/2012 0832   AST 18 02/15/2011 0956   ALT 16* 10/13/2014 2000   ALT 13 12/25/2012 0832   ALT 19 02/15/2011 0956   ALKPHOS 97 10/13/2014 2000   ALKPHOS 118 12/25/2012 0832   ALKPHOS 109* 02/15/2011 0956   BILITOT 0.5 10/13/2014 2000   BILITOT 0.32 12/25/2012 0832   BILITOT 0.50 02/15/2011 0956   GFRNONAA >60 10/16/2014 0613   GFRAA >60 10/16/2014 0613    Lab Results  Component Value Date   HGBA1C 6.1* 10/14/2014     Dg Chest 2 View  10/24/2014   CLINICAL DATA:  Acute onset of shortness of breath. Initial encounter.  EXAM: CHEST  2 VIEW  COMPARISON:   Chest radiograph performed 10/19/2014  FINDINGS: Chronic right-sided post treatment changes and airspace opacities are grossly stable, without definite superimposed airspace consolidation. This reflects prior treated lung cancer and associated fibrotic change. The left lung appears relatively clear.  Blunting of the right costophrenic angle is grossly stable. No significant pleural effusion or pneumothorax is seen.  The cardiomediastinal silhouette is normal in size. No acute osseous abnormalities are identified.  IMPRESSION: Chronic right-sided post treatment changes and fibrotic change. No acute focal airspace consolidation seen.   Electronically Signed   By: Garald Balding M.D.   On: 10/24/2014 02:12   Ct Head Wo Contrast  10/24/2014   CLINICAL DATA:  Question of fall. Concern for head injury. Initial encounter.  EXAM: CT HEAD WITHOUT CONTRAST  TECHNIQUE: Contiguous axial images were obtained from the base of the skull through the vertex without intravenous contrast.  COMPARISON:  CT of the head performed 10/19/2014  FINDINGS: There is no evidence of acute infarction, mass lesion, or intra- or extra-axial hemorrhage on CT.  Prominence of the ventricles and sulci reflects moderate cortical volume loss. Mild cerebellar atrophy is noted. Scattered periventricular and subcortical white matter change likely reflects small vessel ischemic microangiopathy. A small chronic infarct is noted at the right frontal lobe.  The brainstem and fourth ventricle are within normal limits. The basal ganglia are unremarkable in appearance. No mass effect or midline shift is seen.  There is no evidence of fracture; visualized osseous structures are unremarkable in appearance. The orbits are within normal limits. The paranasal sinuses and mastoid air cells are well-aerated. No significant soft tissue abnormalities are seen.  IMPRESSION: 1. No evidence of traumatic intracranial injury or fracture. 2. Moderate cortical volume loss and  scattered small vessel ischemic microangiopathy. Small chronic infarct at the right frontal lobe.   Electronically Signed   By: Garald Balding M.D.   On: 10/24/2014 02:19    Not all labs, radiology exams or other studies done during hospitalization come through on my EPIC note; however they are reviewed by me.    Assessment and Plan  #1-question left eye conjunctivitis-left eye has a very unusual presentation of thin film which appears to be fairly rapidly progressing-he was started on Cipro ophthalmologic solution yesterday for suspected conjunctivitis-at this point secondary to the fairly rapid progression of this will send him to the ER for emergent evaluation he does not appear to be in any distress however.  #2 history of pneumonia-this is followed by pulmonology here she saw pulmonology yesterday is completing an extended course of Augmentin at this point, Pulmonology does not feel he needs additional antibiotic-he continues on duo nebs every 4 hours when necessary as well as Advair twice a day.  [id number]  -of note before EMS arrived   I did reevaluate patient he appeared to be clinically stable and unchanged although again I'm quite concerned about this progressing film on his left eye-      LASSEN, ARLO C,

## 2014-11-10 NOTE — ED Provider Notes (Signed)
CSN: [id number]     Arrival date & time 11/10/14  1404 History   First MD Initiated Contact with Patient 11/10/14 1508     Chief Complaint  Patient presents with  . Eye Problem     (Consider location/radiation/quality/duration/timing/severity/associated sxs/prior Treatment) HPI Comments: 81 yo male with a history of dementia who presents with left eye drainage.  Pt is unable to provide any history.  Per chart review, he was seen at his pulmonologist's office yesterday and was reported to be taking cipro drops.    Level V Caveat secondary to dementia.  Patient is a 81 y.o. male presenting with eye problem.  Eye Problem Location:  L eye Quality: drainage. Severity:  Severe Onset quality:  Gradual Duration: 1.5 weeks ago. Timing:  Constant Progression:  Worsening Context comment:  Nursing home patient. recent CVA.  Relieved by:  Nothing Ineffective treatments: per chart review, he has been on cipro drops. Associated symptoms: crusting     Past Medical History  Diagnosis Date  . Hypertension   . COPD (chronic obstructive pulmonary disease) ([place])   . Hemorrhoids     external  . Complex partial seizure ([place])     from a remote accident  . GERD (gastroesophageal reflux disease)   . Emphysema   . HOH (hard of hearing)   . Pneumonia   . Seizures ([place])   . Headache(784.0)   . Non-small cell carcinoma of lung ([place]) dx'd 2006    stage 3  . Lung cancer ([place])   . Stroke ([place])   . Dementia    Past Surgical History  Procedure Laterality Date  . Inner ear surgery    . Toe amputation Left   . Tonsillectomy    . Amputation Left 04/16/2013    Procedure: AMPUTATION RAY;  Surgeon: Newt Minion, MD;  Location: [facility];  Service: Orthopedics;  Laterality: Left;  Left Foot 4th and 5th Ray Amputation  . Lipoma excision Left 09/08/2013    Procedure: EXCISION OF LEFT PINNA SKIN CANCER WITH FROZEN SECTION ;  Surgeon: Izora Gala, MD;  Location: [facility];  Service: ENT;  Laterality: Left;    Family History  Problem Relation Age of Onset  . Heart failure Brother    Social History  Substance Use Topics  . Smoking status: Current Every Day Smoker -- 1.50 packs/day for 60 years    Types: Cigarettes  . Smokeless tobacco: Never Used  . Alcohol Use: No    Review of Systems  Unable to perform ROS: Dementia      Allergies  Review of patient's allergies indicates no known allergies.  Home Medications   Prior to Admission medications   Medication Sig Start Date End Date Taking? Authorizing Provider  acetaminophen (TYLENOL) 325 MG tablet Take 2 tablets (650 mg total) by mouth every 6 (six) hours as needed for mild pain (or Fever >/= 101). Patient taking differently: Take 650 mg by mouth daily as needed for mild pain (or Fever >/= 101).  10/18/14   Bonnielee Haff, MD  acetaminophen (TYLENOL) 325 MG tablet Take 2 tablets (650 mg total) by mouth 3 (three) times daily. 10/18/14   Bonnielee Haff, MD  aspirin EC 81 MG EC tablet Take 1 tablet (81 mg total) by mouth daily. 10/18/14   Bonnielee Haff, MD  atorvastatin (LIPITOR) 20 MG tablet Take 1 tablet (20 mg total) by mouth daily at 6 PM. 10/18/14   Bonnielee Haff, MD  clopidogrel (PLAVIX) 75 MG tablet Take 1 tablet (75  mg total) by mouth daily. 10/18/14   Bonnielee Haff, MD  feeding supplement, ENSURE ENLIVE, (ENSURE ENLIVE) LIQD Take 237 mLs by mouth 3 (three) times daily between meals. 10/18/14   Bonnielee Haff, MD  Fluticasone-Salmeterol (ADVAIR) 250-50 MCG/DOSE AEPB Inhale 1 puff into the lungs 2 (two) times daily as needed (shortness of breath).     Historical Provider, MD  ipratropium-albuterol (DUONEB) 0.5-2.5 (3) MG/3ML SOLN Take 3 mLs by nebulization every 4 (four) hours as needed (wheezing). 10/18/14 10/18/15  Bonnielee Haff, MD  magnesium hydroxide (MILK OF MAGNESIA) 400 MG/5ML suspension Take 30 mLs by mouth daily as needed for mild constipation.    Historical Provider, MD  nicotine (NICODERM CQ - DOSED IN MG/24 HOURS) 21  mg/24hr patch Place 1 patch (21 mg total) onto the skin daily. 10/18/14   Bonnielee Haff, MD  OLANZapine (ZYPREXA) 5 MG tablet Take 1 tablet (5 mg total) by mouth at bedtime. 10/18/14   Acquanetta Chain, DO  oxyCODONE (OXY IR/ROXICODONE) 5 MG immediate release tablet Take 0.5 tablets (2.5 mg total) by mouth 3 (three) times daily. 10/18/14   Acquanetta Chain, DO  phenytoin (DILANTIN) 300 MG ER capsule Take 1 capsule (300 mg total) by mouth daily. 10/18/14   Bonnielee Haff, MD  pregabalin (LYRICA) 25 MG capsule Take 1 capsule (25 mg total) by mouth 2 (two) times daily. 10/18/14   Acquanetta Chain, DO  PROAIR HFA 108 (90 BASE) MCG/ACT inhaler INHALE 2 PUFFS EVERY 4-6 HOURS AS NEEDED FOR COUGH OR WHEEZING 08/29/14   Historical Provider, MD   BP 121/49 mmHg  Pulse 94  Temp(Src) 97.5 F (36.4 C) (Oral)  Resp 14  SpO2 95% Physical Exam  Constitutional: He is oriented to person, place, and time. He appears well-developed and well-nourished. No distress.  HENT:  Head: Normocephalic and atraumatic.  Eyes: EOM are normal. Right eye exhibits no chemosis and no discharge. Left eye exhibits chemosis and discharge. Right conjunctiva is not injected. Left conjunctiva is injected. No scleral icterus.  Pt has large amount of yellow discharge of left eye, which limits exam.    Neck: Neck supple.  Cardiovascular: Normal rate and intact distal pulses.   Pulmonary/Chest: Effort normal. No stridor. No respiratory distress.  Abdominal: Normal appearance. He exhibits no distension.  Neurological: He is alert and oriented to person, place, and time.  Skin: Skin is warm and dry. No rash noted.  Psychiatric: He has a normal mood and affect. His behavior is normal.  Nursing note and vitals reviewed.   ED Course  Procedures (including critical care time) Labs Review Labs Reviewed - No data to display  Imaging Review No results found. I have personally reviewed and evaluated these images and lab results as  part of my medical decision-making.   EKG Interpretation None      MDM   Final diagnoses:  Endophthalmitis, left    Have consulted Dr. Valetta Close (Ophtho) who eval'd pt in the ED.  Rec'd referral to [facility] for further treatment.  He discussed case with Dr. Caryl Ada at [facility].        Serita Grit, MD 11/10/14 [phone]

## 2014-11-10 NOTE — ED Notes (Signed)
Per EMS-from [facility]. C/o left eye drainage and infection starting 1.5 weeks ago. Now has yellow drainage completely covering pupil. Redness noted. According to staff patient has been repeatedly touching his eye with his hand. No other c/c. VSS- HR 99 BP 110/70 CBG 191 mg/dl.

## 2014-11-10 NOTE — Telephone Encounter (Signed)
Received call report at 12:32pm on pt's portable chest xray done at [facility] Pt's nurse Monte from [employer] called and reported that the patient's portable chest xray showed pneumonia.  I verbally informed TP of results. Per TP after reviewing results Extend Augmentin '875mg'$  #14 1 tab by mouth BID x 7 days Repeat CXR in 4 weeks.   Called [phone] and spoke to Laverna Peace the current shift nurse at [facility] her of TP's recs and orders. Masiah voiced understanding and had no further questions. Nothing further needed.

## 2014-11-10 NOTE — ED Notes (Signed)
Bed: [facility] Expected date:  Expected time:  Means of arrival:  Comments: Ems- elderly eye infection

## 2014-11-13 NOTE — ED Notes (Signed)
Opened chart at request of registration to locate where pt went at discharge.

## 2014-11-17 ENCOUNTER — Inpatient Hospital Stay: Payer: Self-pay | Admitting: Pulmonary Disease

## 2014-11-19 ENCOUNTER — Encounter: Payer: Self-pay | Admitting: Internal Medicine

## 2014-11-19 DIAGNOSIS — H109 Unspecified conjunctivitis: Secondary | ICD-10-CM | POA: Insufficient documentation

## 2014-11-29 ENCOUNTER — Encounter: Payer: Self-pay | Admitting: Internal Medicine

## 2014-11-29 ENCOUNTER — Non-Acute Institutional Stay (SKILLED_NURSING_FACILITY): Payer: Medicare Other | Admitting: Internal Medicine

## 2014-11-29 DIAGNOSIS — H16002 Unspecified corneal ulcer, left eye: Secondary | ICD-10-CM | POA: Diagnosis not present

## 2014-11-29 DIAGNOSIS — F03918 Unspecified dementia, unspecified severity, with other behavioral disturbance: Secondary | ICD-10-CM

## 2014-11-29 DIAGNOSIS — F0391 Unspecified dementia with behavioral disturbance: Secondary | ICD-10-CM

## 2014-11-29 DIAGNOSIS — N179 Acute kidney failure, unspecified: Secondary | ICD-10-CM | POA: Diagnosis not present

## 2014-11-29 DIAGNOSIS — R627 Adult failure to thrive: Secondary | ICD-10-CM

## 2014-11-29 DIAGNOSIS — E870 Hyperosmolality and hypernatremia: Secondary | ICD-10-CM | POA: Diagnosis not present

## 2014-11-29 DIAGNOSIS — H16032 Corneal ulcer with hypopyon, left eye: Secondary | ICD-10-CM

## 2014-11-29 DIAGNOSIS — G629 Polyneuropathy, unspecified: Secondary | ICD-10-CM | POA: Diagnosis not present

## 2014-11-29 DIAGNOSIS — G40209 Localization-related (focal) (partial) symptomatic epilepsy and epileptic syndromes with complex partial seizures, not intractable, without status epilepticus: Secondary | ICD-10-CM | POA: Diagnosis not present

## 2014-12-03 DIAGNOSIS — E870 Hyperosmolality and hypernatremia: Secondary | ICD-10-CM | POA: Insufficient documentation

## 2014-12-03 DIAGNOSIS — N179 Acute kidney failure, unspecified: Secondary | ICD-10-CM | POA: Insufficient documentation

## 2014-12-03 NOTE — Assessment & Plan Note (Signed)
SNF - continues, rapid decline, protein malnutrition; dietary support as possible

## 2014-12-03 NOTE — Assessment & Plan Note (Signed)
SZNF - 2/2 chronic alcoholism; plan - cont lyrica as comfort measure

## 2014-12-03 NOTE — Assessment & Plan Note (Signed)
SNF - declining, FTT, cont zyprexa to avoid decompensation

## 2014-12-03 NOTE — Assessment & Plan Note (Addendum)
SNF - no recent seizures;being successfully controlled with dilantin; cont dilantin

## 2014-12-03 NOTE — Assessment & Plan Note (Signed)
Improved with IVF with 1/2 NS; SNF - follow with BMP, encourage po intake

## 2014-12-03 NOTE — Assessment & Plan Note (Signed)
2/2 to poor po intake but mostly insult from vancomycin and zosyn; improved with IVF and change in vancomycin dose; SNF - avoid nephrotoxic agents

## 2014-12-19 ENCOUNTER — Ambulatory Visit: Payer: Medicare Other | Admitting: Neurology

## 2014-12-22 NOTE — Assessment & Plan Note (Signed)
Pt  was treated with vancomycin and tobramycin drops as well as IV avelox and po clindamycin which was subsequently changed to IV vanc/zosyn as well as valtrex for suspicion of herpes zoster and po doxy per ophthalmology for concern of corneal perforation. Intrvitreous culture returned neg growth. Pt did go to surgery for enucleation on 11/2 and all abx were stopped and pt recovered well afterwards surgery wise

## 2014-12-22 NOTE — Progress Notes (Signed)
MRN: [id number] Name: Paul [name]  Sex: male Age: 81 y.o. DOB: 1931/01/12  [place] #: [facility] Facility/Room: 217 Level Of Care: SNF Provider: Inocencio Homes D Emergency Contacts: Extended Emergency Contact Information Primary Emergency Contact: Irby,Tracy Address: [address]          [place], [place] [address] Johnnette Litter of [place] Phone: [phone] Mobile Phone: [phone] Relation: Daughter Secondary Emergency Contact: Leitha Bleak [place] of [place] Phone: [phone] Mobile Phone: [phone] Relation: Son  Code Status:   Allergies: Review of patient's allergies indicates no known allergies.  Chief Complaint  Patient presents with  . New Admit To SNF    HPI: Patient is 81 y.o. male with HTN, COPD, complex partial seizures,s/p CVA, stage 3 non small cell lung CA and dementia who presented with 1.5 weeks of L eye drainage not responsive to Cipro drops to [facility] ED. Pt was admitted from10/20-11/7 where he was treated with IV antibiotics and valtrex  per ophthalmology for concern of corneal perforation. Pt went  to surgery for enucleation on 11/23/2014   2/2 corneal ulcer and hypopyon and all abx were stopped and pt recovered well afterwards surgery wise. Hospital course was complicated by AKI,and hypernatremia and hyperchloremia which resolved with IV fluids. Pt also had cachexia and severe protein malnutrition. Because of this pt and pt overall poor prognosis from co-morbidities family agree to Hospice at [facility]. Pt is admitted to SNF for wound care for eye, supportive care and Hospice care.While at SNF pt will be followed for seizures, tx with dilantin, COPD tx with advair,pro-air and duoneb and dementia with zyprexa, which would all be considered essential or comfort measures.  Past Medical History  Diagnosis Date  . Hypertension   . COPD (chronic obstructive pulmonary disease) ([place])   . Hemorrhoids     external  . Complex partial seizure ([place])     from a  remote accident  . GERD (gastroesophageal reflux disease)   . Emphysema   . HOH (hard of hearing)   . Pneumonia   . Seizures ([place])   . Headache(784.0)   . Non-small cell carcinoma of lung ([place]) dx'd 2006    stage 3  . Lung cancer ([place])   . Stroke ([place])   . Dementia     Past Surgical History  Procedure Laterality Date  . Inner ear surgery    . Toe amputation Left   . Tonsillectomy    . Amputation Left 04/16/2013    Procedure: AMPUTATION RAY;  Surgeon: Newt Minion, MD;  Location: [facility];  Service: Orthopedics;  Laterality: Left;  Left Foot 4th and 5th Ray Amputation  . Lipoma excision Left 09/08/2013    Procedure: EXCISION OF LEFT PINNA SKIN CANCER WITH FROZEN SECTION ;  Surgeon: Izora Gala, MD;  Location: [facility];  Service: ENT;  Laterality: Left;      Medication List       This list is accurate as of: 2014-12-01 11:59 PM.  Always use your most recent med list.               acetaminophen 325 MG tablet  Commonly known as:  TYLENOL  Take 2 tablets (650 mg total) by mouth every 6 (six) hours as needed for mild pain (or Fever >/= 101).     acetaminophen 325 MG tablet  Commonly known as:  TYLENOL  Take 2 tablets (650 mg total) by mouth 3 (three) times daily.     feeding supplement (ENSURE ENLIVE) Liqd  Take 237 mLs by mouth 3 (three) times daily between meals.     Fluticasone-Salmeterol 250-50 MCG/DOSE Aepb  Commonly known as:  ADVAIR  Inhale 1 puff into the lungs 2 (two) times daily as needed (shortness of breath).     ipratropium-albuterol 0.5-2.5 (3) MG/3ML Soln  Commonly known as:  DUONEB  Take 3 mLs by nebulization every 4 (four) hours as needed (wheezing).     magnesium hydroxide 400 MG/5ML suspension  Commonly known as:  MILK OF MAGNESIA  Take 30 mLs by mouth daily as needed for mild constipation.     mirtazapine 15 MG tablet  Commonly known as:  REMERON  Take 15 mg by mouth at bedtime.     nicotine 21 mg/24hr patch  Commonly known as:  NICODERM CQ - dosed  in mg/24 hours  Place 1 patch (21 mg total) onto the skin daily.     OLANZapine 5 MG tablet  Commonly known as:  ZYPREXA  Take 1 tablet (5 mg total) by mouth at bedtime.     oxyCODONE 5 MG immediate release tablet  Commonly known as:  Oxy IR/ROXICODONE  Take 0.5 tablets (2.5 mg total) by mouth 3 (three) times daily.     phenytoin 300 MG ER capsule  Commonly known as:  DILANTIN  Take 1 capsule (300 mg total) by mouth daily.     pregabalin 25 MG capsule  Commonly known as:  LYRICA  Take 1 capsule (25 mg total) by mouth 2 (two) times daily.     PROAIR HFA 108 (90 BASE) MCG/ACT inhaler  Generic drug:  albuterol  INHALE 2 PUFFS EVERY 4-6 HOURS AS NEEDED FOR COUGH OR WHEEZING        Meds ordered this encounter  Medications  . mirtazapine (REMERON) 15 MG tablet    Sig: Take 15 mg by mouth at bedtime.    Immunization History  Administered Date(s) Administered  . Influenza Split 01/10/2011  . Influenza Whole 12/03/2006  . Pneumococcal Polysaccharide-23 12/03/2006  . Td 07/13/2011  . Tdap 02/09/2012, 10/13/2014, 10/24/2014    Social History  Substance Use Topics  . Smoking status: Current Every Day Smoker -- 1.50 packs/day for 60 years    Types: Cigarettes  . Smokeless tobacco: Never Used  . Alcohol Use: No    Family history is + CHF    Review of Systems UTO 2/2 dementia; per nursing pt is combatative with treatments.    Filed Vitals:   2014/12/12 1506  BP: 135/70  Pulse: 58  Temp: 96.9 F (36.1 C)  Resp: 18    SpO2 Readings from Last 1 Encounters:  11/10/14 98%        Physical Exam  GENERAL APPEARANCE: Alert, non conversant,  No acute distress.  SKIN: No diaphoresis rash;senile purpura HEAD: Normocephalic, atraumatic  EYES: L eye with operative packing and dressing  EARS: External exam WNL, canals clear. Hearing grossly normal.  NOSE: No deformity or discharge.  MOUTH/THROAT: Lips w/o lesions  RESPIRATORY: Breathing is even, unlabored. Lung sounds  are clear   CARDIOVASCULAR: Heart RRR no murmurs, rubs or gallops. No peripheral edema.   GASTROINTESTINAL: Abdomen is soft, non-tender, not distended w/ normal bowel sounds. GENITOURINARY: Bladder non tender, not distended  MUSCULOSKELETAL: muscle wasting NEUROLOGIC:  Cranial nerves 2-12 grossly intact. Moves all extremities  PSYCHIATRIC: dementia  Patient Active Problem List   Diagnosis Date Noted  . Acute kidney injury ([place]) 12/03/2014  . Hypernatremia 12/03/2014  . Corneal ulcer of left eye with hypopyon  12/23/14  . Conjunctivitis 11/19/2014  . Dementia with behavioral disturbance 10/26/2014  . Hyperlipidemia 10/23/2014  . Neuropathy ([place]) 10/23/2014  . Palliative care encounter   . Stroke ([place])   . Complex partial seizure ([place]) 10/14/2014  . Altered mental status   . Stroke with cerebral ischemia ([place])   . Sepsis ([place])   . Faintness   . Weakness   . Protein-calorie malnutrition, severe ([place]) 10/12/2014  . Cavitary pneumonia 10/12/2014  . Lung cancer ([place])   . CAP (community acquired pneumonia) 10/10/2014  . Shortness of breath 10/10/2014  . FTT (failure to thrive) in adult 10/10/2014  . CVA (cerebral infarction) 06/10/2013  . Acute CVA (cerebrovascular accident) ([place]) 06/10/2013  . Accidental drug overdose--narcotics 04/01/2013  . Chronic osteomyelitis of left foot s/p toe amputation and ischemic changes 04/01/2013  . Acute encephalopathy 04/01/2013  . Falls 07/15/2012  . Mass of right ear canal 07/15/2012  . Dysphagia 01/16/2012  . Cellulitis of left lower extremity 01/16/2012  . PERSONAL HISTORY, VENOUS THROMBOSIS AND EMBOLISM 03/23/2008  . Acute thromboembolism of deep veins of lower extremity ([place]) 12/03/2006  . INSOMNIA 12/03/2006  . TOBACCO ABUSE 01/02/2006  . Essential hypertension 01/02/2006  . EXTERNAL HEMORRHOIDS 01/02/2006  . COPD (chronic obstructive pulmonary disease) ([place]) 01/02/2006  . GASTROESOPHAGEAL REFLUX DISEASE 01/02/2006  . BURSITIS,  SHOULDER 01/02/2006  . Complex partial seizures ([place]) 01/02/2006  . ALCOHOL ABUSE, HX OF 01/02/2006  . CARCINOMA, LUNG, SQUAMOUS CELL 02/07/2004    CBC    Component Value Date/Time   WBC 8.9 10/24/2014 0023   WBC 5.6 12/25/2012 0832   RBC 4.50 10/24/2014 0023   RBC 5.08 12/25/2012 0832   HGB 13.3 10/24/2014 0031   HGB 15.2 12/25/2012 0832   HCT 39.0 10/24/2014 0031   HCT 45.8 12/25/2012 0832   PLT 406* 10/24/2014 0023   PLT 212 12/25/2012 0832   MCV 82.4 10/24/2014 0023   MCV 90.1 12/25/2012 0832   LYMPHSABS 0.4* 10/24/2014 0023   LYMPHSABS 0.9 12/25/2012 0832   MONOABS 0.9 10/24/2014 0023   MONOABS 0.7 12/25/2012 0832   EOSABS 0.1 10/24/2014 0023   EOSABS 0.3 12/25/2012 0832   BASOSABS 0.0 10/24/2014 0023   BASOSABS 0.0 12/25/2012 0832    CMP     Component Value Date/Time   NA 137 10/24/2014 0031   NA 140 12/25/2012 0832   NA 142 02/15/2011 0956   K 3.7 10/24/2014 0031   K 4.7 12/25/2012 0832   K 4.4 02/15/2011 0956   CL 97* 10/24/2014 0031   CL 100 04/16/2012 0832   CL 99 02/15/2011 0956   CO2 22 10/16/2014 0613   CO2 27 12/25/2012 0832   CO2 29 02/15/2011 0956   GLUCOSE 100* 10/24/2014 0031   GLUCOSE 123 12/25/2012 0832   GLUCOSE 113* 04/16/2012 0832   GLUCOSE 137* 02/15/2011 0956   BUN 12 10/24/2014 0031   BUN 7.5 12/25/2012 0832   BUN 8 02/15/2011 0956   CREATININE 0.50* 10/24/2014 0031   CREATININE 0.8 12/25/2012 0832   CREATININE 0.5* 02/15/2011 0956   CALCIUM 8.1* 10/16/2014 0613   CALCIUM 9.3 12/25/2012 0832   CALCIUM 8.8 02/15/2011 0956   PROT 5.7* 10/13/2014 2000   PROT 6.9 12/25/2012 0832   PROT 7.4 02/15/2011 0956   ALBUMIN 2.0* 10/13/2014 2000   ALBUMIN 3.6 12/25/2012 0832   ALBUMIN 3.7 02/15/2011 0956   AST 23 10/13/2014 2000   AST 14 12/25/2012 0832   AST 18 02/15/2011 0956   ALT 16*  10/13/2014 2000   ALT 13 12/25/2012 0832   ALT 19 02/15/2011 0956   ALKPHOS 97 10/13/2014 2000   ALKPHOS 118 12/25/2012 0832   ALKPHOS 109*  02/15/2011 0956   BILITOT 0.5 10/13/2014 2000   BILITOT 0.32 12/25/2012 0832   BILITOT 0.50 02/15/2011 0956   GFRNONAA >60 10/16/2014 0613   GFRAA >60 10/16/2014 0300    Lab Results  Component Value Date   HGBA1C 6.1* 10/14/2014     No results found.  Not all labs, radiology exams or other studies done during hospitalization come through on my EPIC note; however they are reviewed by me.    Assessment and Plan  Corneal ulcer of left eye with hypopyon Pt  was treated with vancomycin and tobramycin drops as well as IV avelox and po clindamycin which was subsequently changed to IV vanc/zosyn as well as valtrex for suspicion of herpes zoster and po doxy per ophthalmology for concern of corneal perforation. Intrvitreous culture returned neg growth. Pt did go to surgery for enucleation on 11/2 and all abx were stopped and pt recovered well afterwards surgery wise  Acute kidney injury ([place]) 2/2 to poor po intake but mostly insult from vancomycin and zosyn; improved with IVF and change in vancomycin dose; SNF - avoid nephrotoxic agents  Hypernatremia Improved with IVF with 1/2 NS; SNF - follow with BMP, encourage po intake  FTT (failure to thrive) in adult SNF - continues, rapid decline, protein malnutrition; dietary support as possible  Complex partial seizures (HCC) SNF - no recent seizures;being successfully controlled with dilantin; cont dilantin  Dementia with behavioral disturbance SNF - declining, FTT, cont zyprexa to avoid decompensation  Neuropathy (HCC) SZNF - 2/2 chronic alcoholism; plan - cont lyrica as comfort measure   Time spent > 45 min;> 50% of time with patient was spent reviewing records, labs, tests and studies, counseling and developing plan of care  Hennie Duos, MD

## 2014-12-22 DEATH — deceased

## 2015-12-03 IMAGING — CR DG FOOT COMPLETE 3+V*L*
3 series · 3 of 3 positions shown · non-contrast
Comparison: No priors.

CLINICAL DATA: Infected fifth toe.

EXAM:
LEFT FOOT - COMPLETE 3+ VIEW

[view not recorded (1 of 3)]
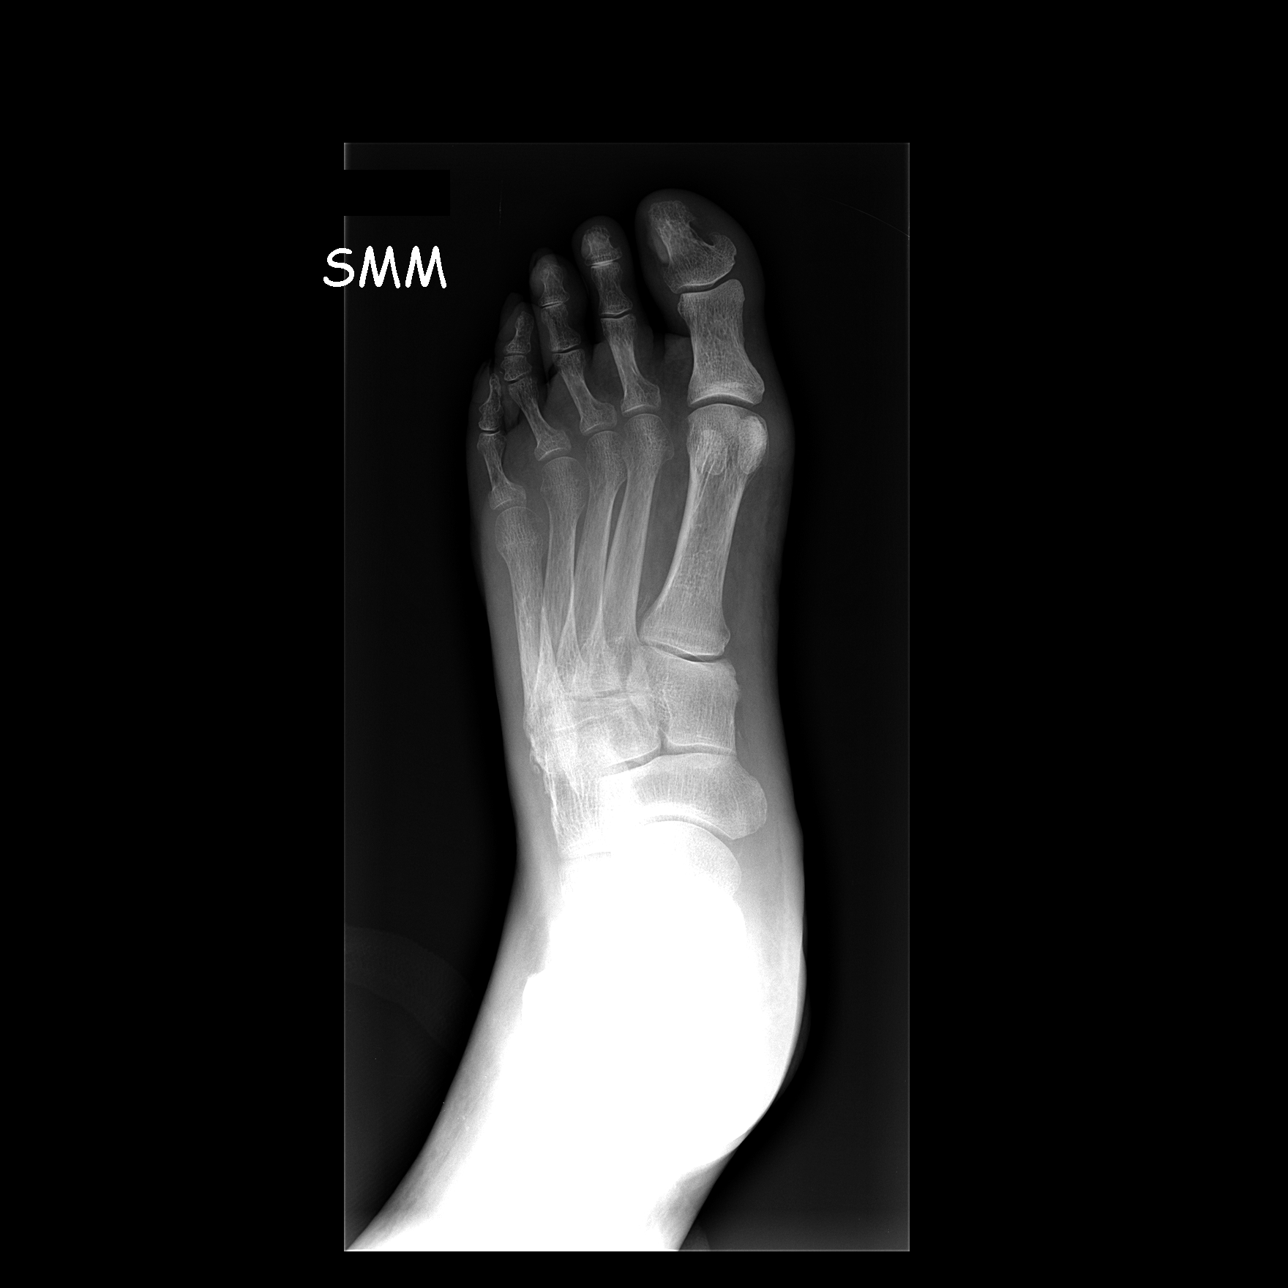

[view not recorded (2 of 3)]
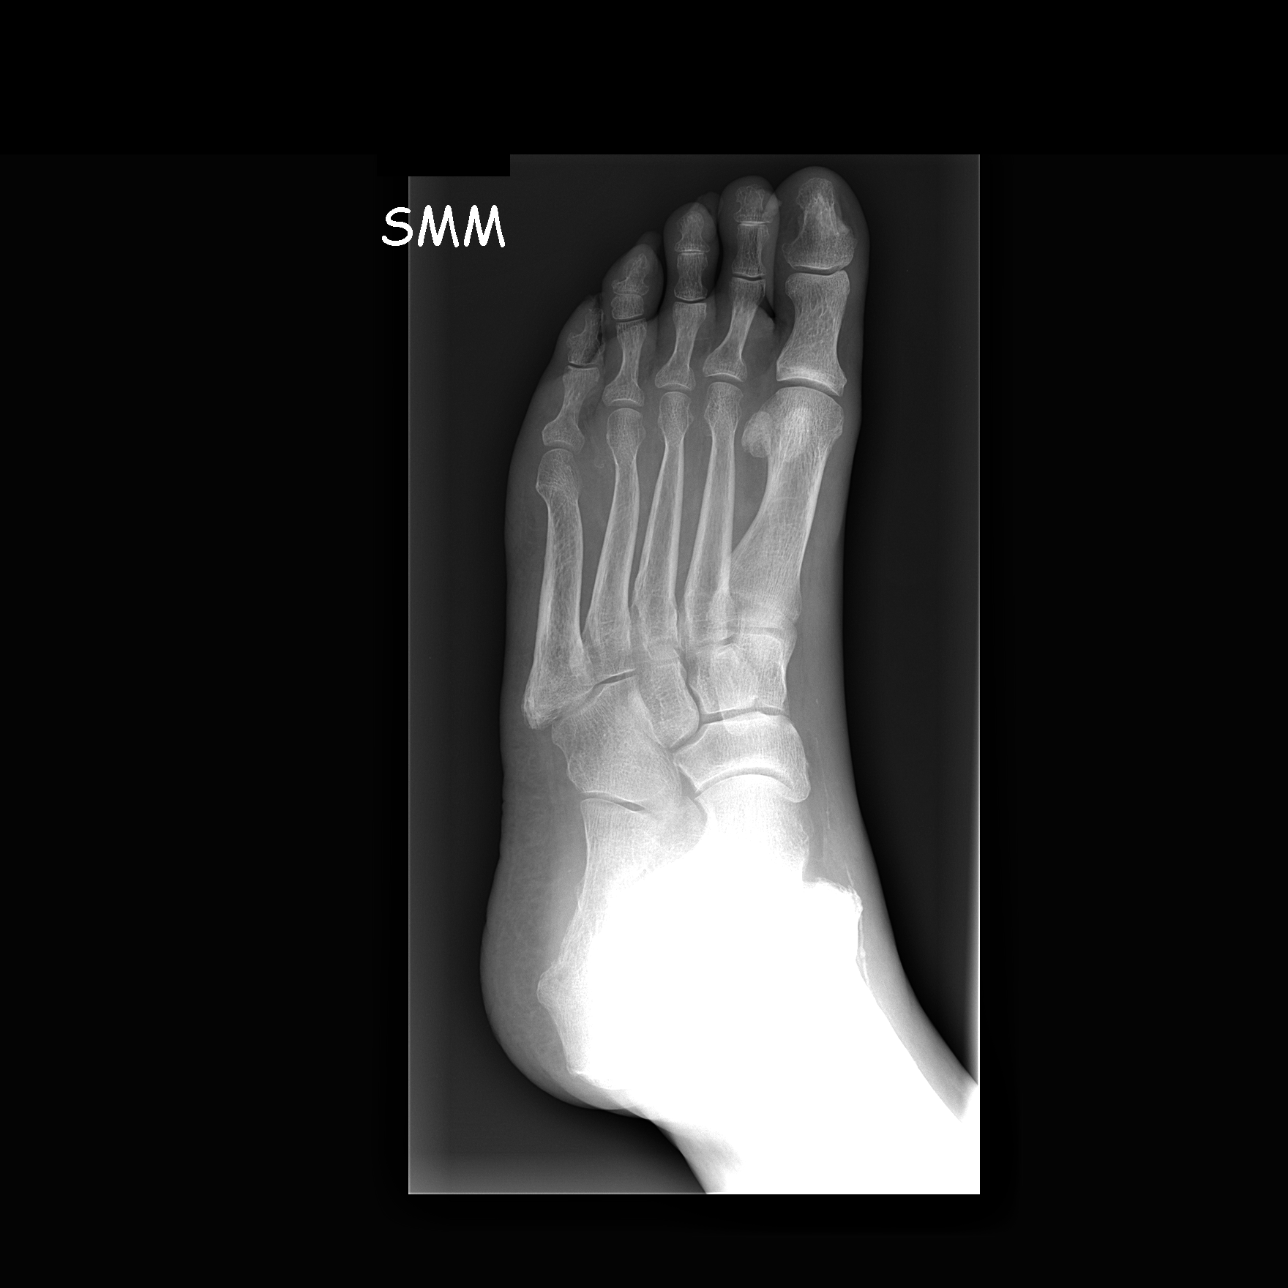

[view not recorded (3 of 3)]
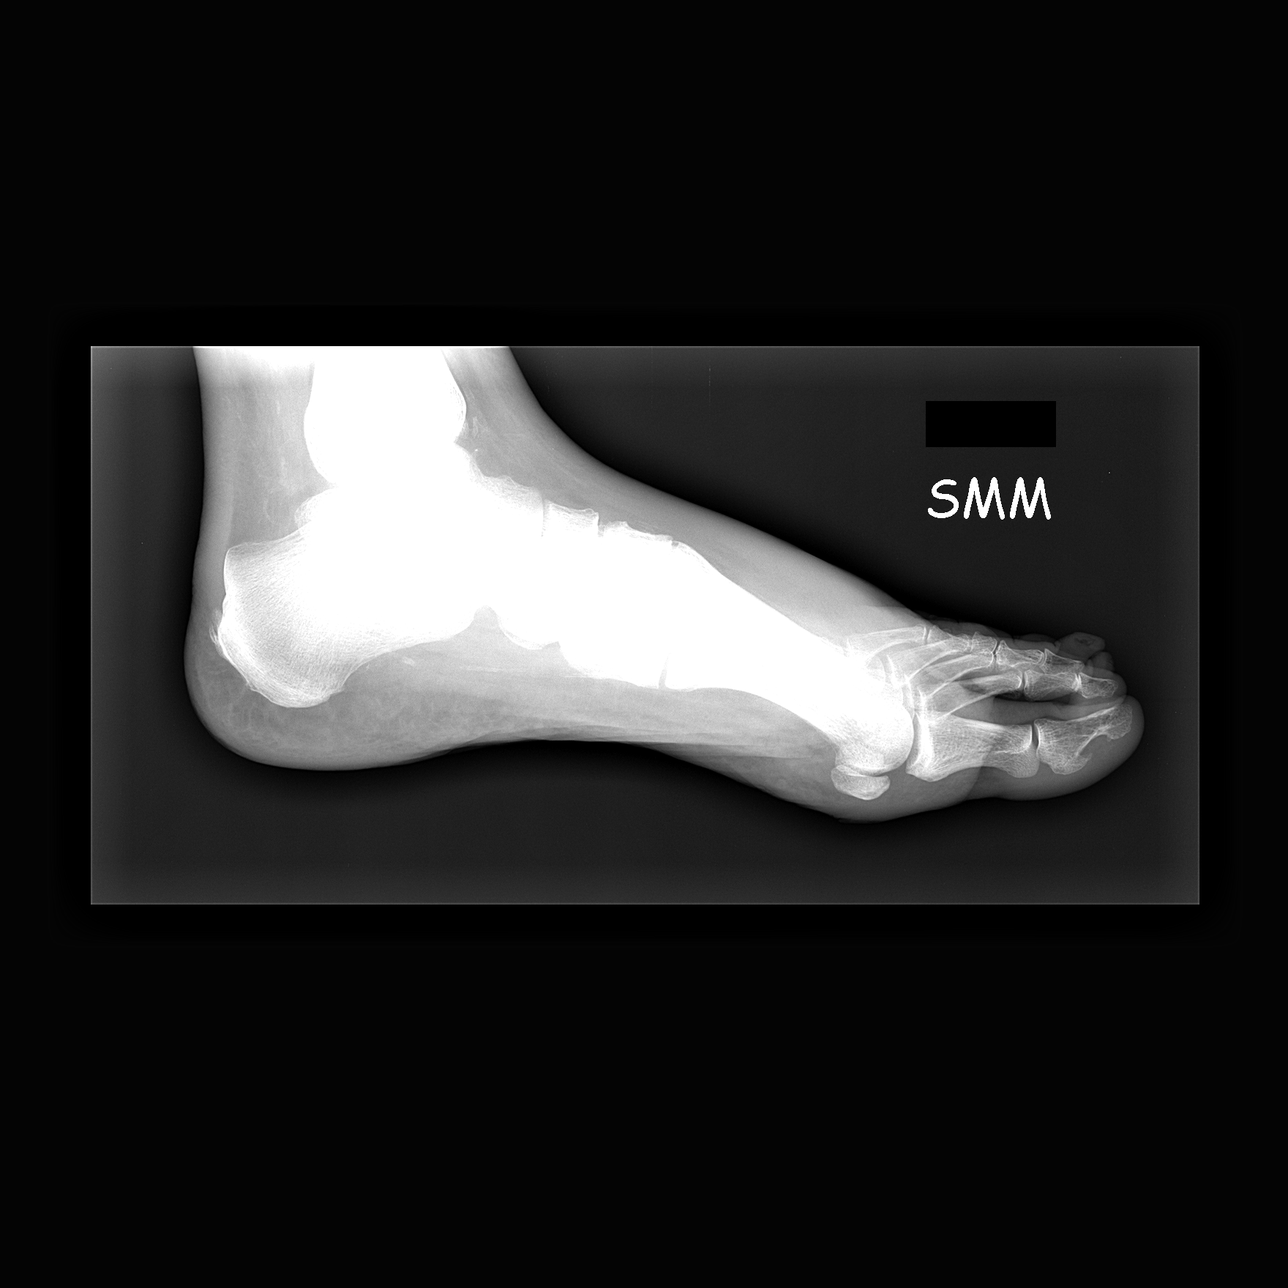

[3 of 3 positions shown; findings below may reference images not displayed]

FINDINGS: Three views of the left foot demonstrate no acute displaced
fracture, subluxation or dislocation. No definite osteolysis in the
left fifth toe is identified at this time.
IMPRESSION: 1. No osteolysis of the left fifth toe to suggest osteomyelitis on
this plain film examination.

## 2016-02-10 IMAGING — CR DG CHEST 2V
2 series · 2 of 2 positions shown · non-contrast
Comparison: 04/01/2013

CLINICAL DATA: Sick for 2 weeks, cough, sore chest, smoker,
hypertension, COPD, history non-small-cell lung cancer

EXAM:
CHEST  2 VIEW

[view not recorded (1 of 2)]
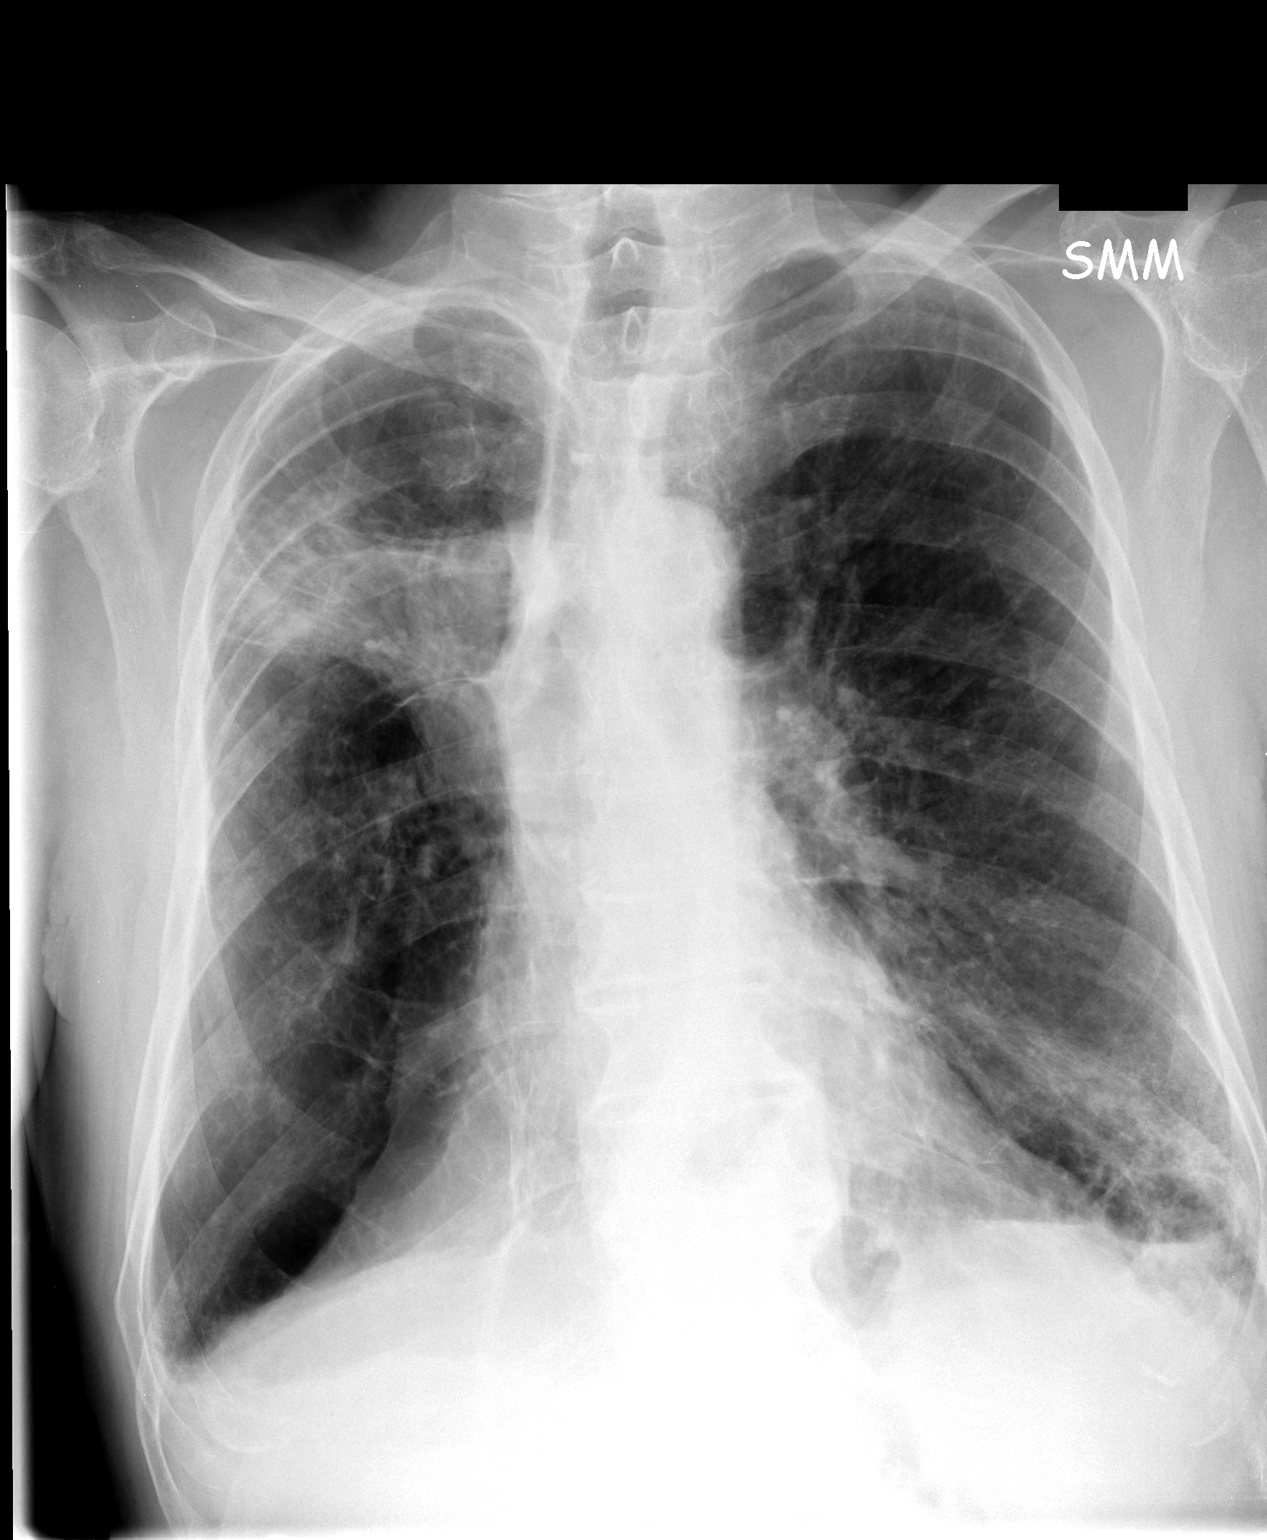

[view not recorded (2 of 2)]
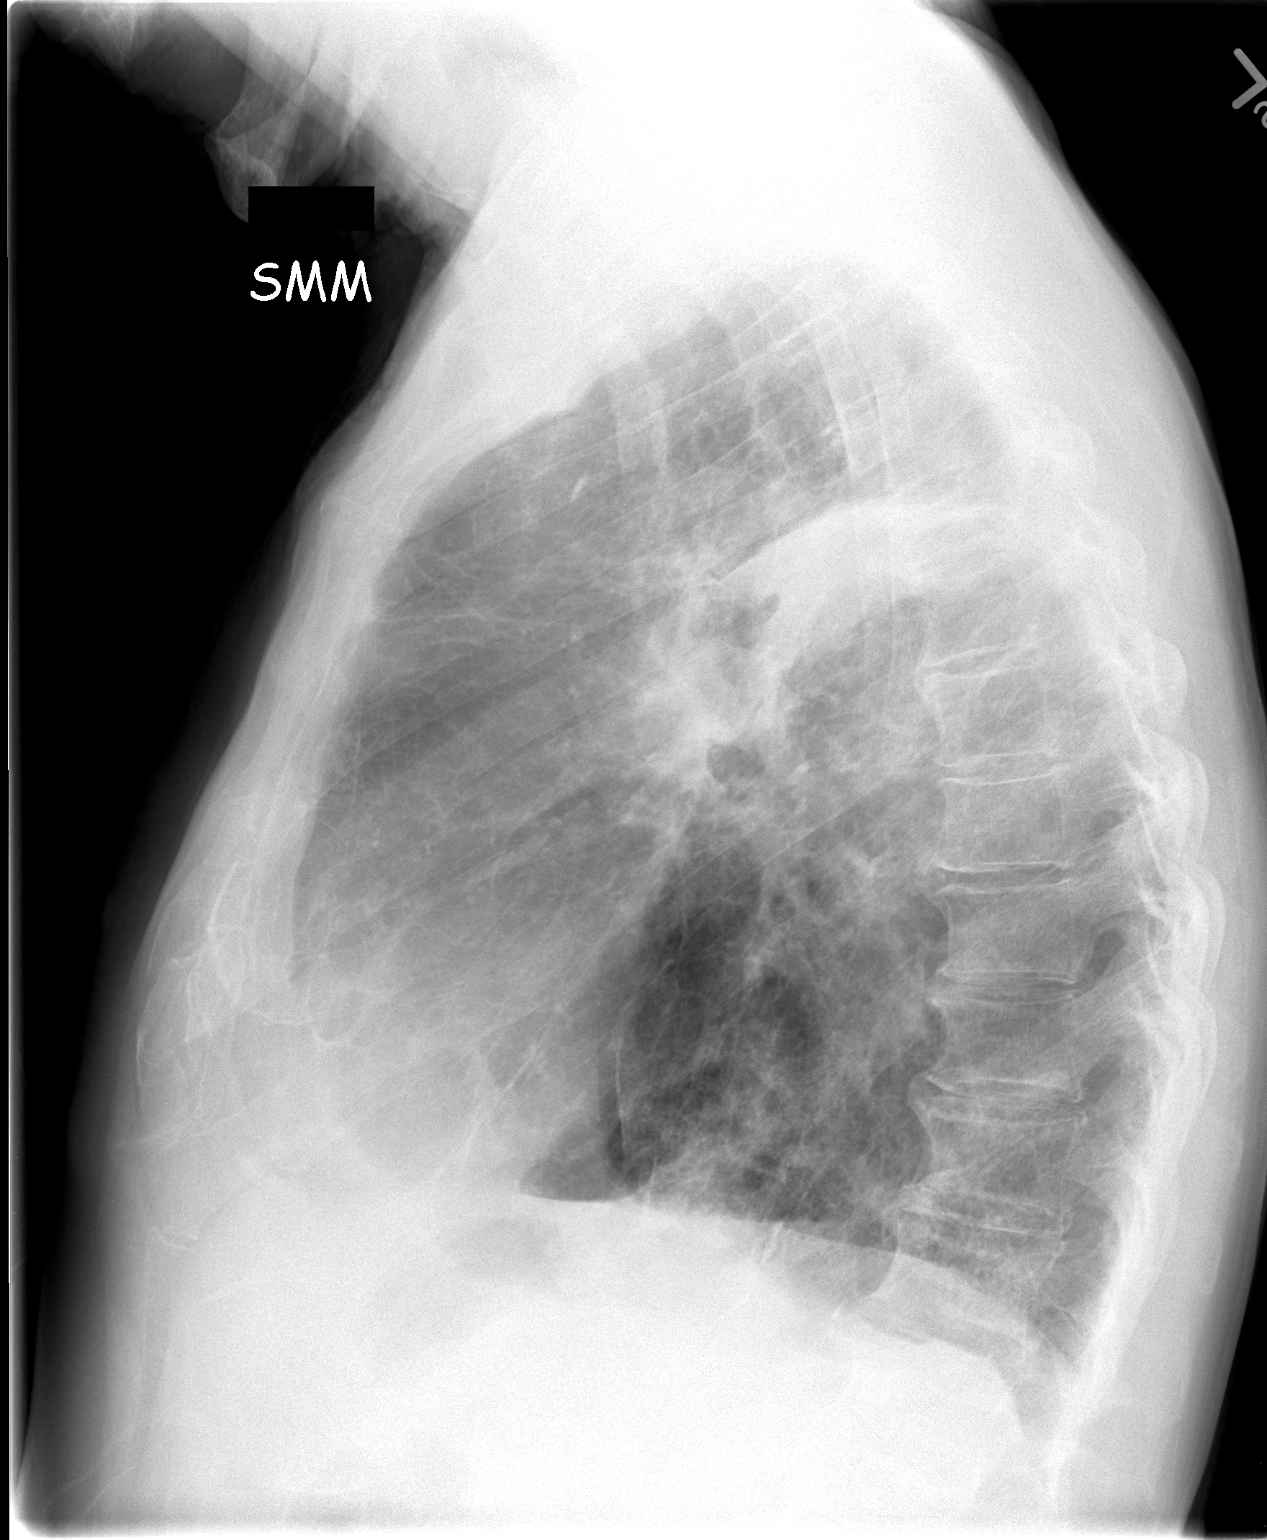

[2 of 2 positions shown; findings below may reference images not displayed]

FINDINGS: Normal heart size and pulmonary vascularity.

Small hiatal hernia.

Right upper lobe volume loss and opacity, in part related to known
posttherapy changes in the right upper lobe from prior lung cancer.

However there appears to be increase in peripheral density in the
right upper lobe adjacent to the minor fissure which could reflect a
peripheral infiltrate/pneumonia.

Underlying emphysematous and bronchitic changes with right basilar
scarring.

Mild infiltrate at lateral left base.

No gross pleural effusion or pneumothorax.
IMPRESSION: COPD changes with right basilar scarring.

Right upper lobe opacity corresponding to site of prior tumor
therapy, question scarring, residual tumor not excluded.

Suspect mild right upper lobe and left basilar infiltrates.
